# Patient Record
Sex: Female | Born: 1945 | Race: White | Hispanic: No | Smoking: Former smoker
Health system: Southern US, Community
[De-identification: ages and names within clinical notes are randomized; demographics above are authoritative.]

## PROBLEM LIST (undated history)

## (undated) DIAGNOSIS — I2699 Other pulmonary embolism without acute cor pulmonale: Secondary | ICD-10-CM

## (undated) DIAGNOSIS — D649 Anemia, unspecified: Secondary | ICD-10-CM

## (undated) DIAGNOSIS — I82409 Acute embolism and thrombosis of unspecified deep veins of unspecified lower extremity: Secondary | ICD-10-CM

## (undated) DIAGNOSIS — E785 Hyperlipidemia, unspecified: Secondary | ICD-10-CM

## (undated) DIAGNOSIS — I10 Essential (primary) hypertension: Secondary | ICD-10-CM

## (undated) DIAGNOSIS — F0390 Unspecified dementia without behavioral disturbance: Secondary | ICD-10-CM

## (undated) DIAGNOSIS — C50919 Malignant neoplasm of unspecified site of unspecified female breast: Secondary | ICD-10-CM

## (undated) DIAGNOSIS — J189 Pneumonia, unspecified organism: Secondary | ICD-10-CM

## (undated) DIAGNOSIS — F419 Anxiety disorder, unspecified: Secondary | ICD-10-CM

## (undated) DIAGNOSIS — M1990 Unspecified osteoarthritis, unspecified site: Secondary | ICD-10-CM

## (undated) DIAGNOSIS — Z8489 Family history of other specified conditions: Secondary | ICD-10-CM

## (undated) DIAGNOSIS — C801 Malignant (primary) neoplasm, unspecified: Secondary | ICD-10-CM

## (undated) DIAGNOSIS — Q282 Arteriovenous malformation of cerebral vessels: Secondary | ICD-10-CM

## (undated) HISTORY — DX: Malignant neoplasm of unspecified site of unspecified female breast: C50.919

## (undated) HISTORY — PX: ABDOMINAL HYSTERECTOMY: SHX81

## (undated) HISTORY — PX: TUBAL LIGATION: SHX77

## (undated) HISTORY — PX: TONSILLECTOMY: SUR1361

## (undated) HISTORY — PX: COLONOSCOPY: SHX174

## (undated) MED FILL — Dexamethasone Sodium Phosphate Inj 100 MG/10ML: INTRAMUSCULAR | Qty: 1 | Status: AC

---

## 2003-06-26 ENCOUNTER — Other Ambulatory Visit: Payer: Self-pay

## 2004-08-16 ENCOUNTER — Emergency Department: Payer: Self-pay | Admitting: Emergency Medicine

## 2004-08-16 ENCOUNTER — Other Ambulatory Visit: Payer: Self-pay

## 2004-08-23 ENCOUNTER — Ambulatory Visit: Payer: Self-pay | Admitting: Emergency Medicine

## 2005-02-27 ENCOUNTER — Ambulatory Visit: Payer: Self-pay | Admitting: Obstetrics and Gynecology

## 2005-03-12 ENCOUNTER — Ambulatory Visit: Payer: Self-pay | Admitting: Obstetrics and Gynecology

## 2006-08-09 ENCOUNTER — Ambulatory Visit: Payer: Self-pay | Admitting: Obstetrics and Gynecology

## 2007-08-12 ENCOUNTER — Ambulatory Visit: Payer: Self-pay | Admitting: Obstetrics and Gynecology

## 2008-08-12 ENCOUNTER — Ambulatory Visit: Payer: Self-pay | Admitting: Obstetrics and Gynecology

## 2009-10-04 ENCOUNTER — Ambulatory Visit: Payer: Self-pay | Admitting: Obstetrics and Gynecology

## 2011-01-24 ENCOUNTER — Ambulatory Visit: Payer: Self-pay | Admitting: Obstetrics and Gynecology

## 2011-03-15 ENCOUNTER — Ambulatory Visit: Payer: Self-pay | Admitting: Unknown Physician Specialty

## 2011-03-19 LAB — PATHOLOGY REPORT

## 2012-06-06 ENCOUNTER — Ambulatory Visit: Payer: Self-pay | Admitting: Obstetrics and Gynecology

## 2013-05-13 ENCOUNTER — Ambulatory Visit: Payer: Self-pay | Admitting: Internal Medicine

## 2013-06-11 DIAGNOSIS — I671 Cerebral aneurysm, nonruptured: Secondary | ICD-10-CM

## 2013-06-11 HISTORY — DX: Cerebral aneurysm, nonruptured: I67.1

## 2013-06-11 HISTORY — PX: BRAIN SURGERY: SHX531

## 2013-06-22 ENCOUNTER — Ambulatory Visit: Payer: Self-pay | Admitting: Obstetrics and Gynecology

## 2013-06-26 ENCOUNTER — Ambulatory Visit: Payer: Self-pay | Admitting: Obstetrics and Gynecology

## 2013-11-08 DIAGNOSIS — I770 Arteriovenous fistula, acquired: Secondary | ICD-10-CM | POA: Insufficient documentation

## 2013-12-24 ENCOUNTER — Ambulatory Visit: Payer: Self-pay | Admitting: Obstetrics and Gynecology

## 2014-08-02 ENCOUNTER — Ambulatory Visit: Payer: Self-pay | Admitting: Obstetrics and Gynecology

## 2015-03-22 DIAGNOSIS — Z79890 Hormone replacement therapy (postmenopausal): Secondary | ICD-10-CM | POA: Insufficient documentation

## 2015-06-17 DIAGNOSIS — R9082 White matter disease, unspecified: Secondary | ICD-10-CM | POA: Diagnosis not present

## 2015-06-17 DIAGNOSIS — Z79899 Other long term (current) drug therapy: Secondary | ICD-10-CM | POA: Diagnosis not present

## 2015-06-17 DIAGNOSIS — I770 Arteriovenous fistula, acquired: Secondary | ICD-10-CM | POA: Diagnosis not present

## 2015-06-27 DIAGNOSIS — N950 Postmenopausal bleeding: Secondary | ICD-10-CM | POA: Diagnosis not present

## 2015-06-27 DIAGNOSIS — Z79890 Hormone replacement therapy (postmenopausal): Secondary | ICD-10-CM | POA: Diagnosis not present

## 2015-07-13 DIAGNOSIS — Z79890 Hormone replacement therapy (postmenopausal): Secondary | ICD-10-CM | POA: Diagnosis not present

## 2015-07-13 DIAGNOSIS — N950 Postmenopausal bleeding: Secondary | ICD-10-CM | POA: Diagnosis not present

## 2015-07-22 ENCOUNTER — Encounter: Payer: Self-pay | Admitting: *Deleted

## 2015-07-22 ENCOUNTER — Other Ambulatory Visit: Payer: Self-pay

## 2015-07-22 NOTE — Patient Instructions (Signed)
  Your procedure is scheduled on: 07-29-15(FRIDAY) Report to [place] To find out your arrival time please call [phone] between 1PM - 3PM on [id number] [facility])  Remember: Instructions that are not followed completely may result in serious medical risk, up to and including death, or upon the discretion of your surgeon and anesthesiologist your surgery may need to be rescheduled.    _X___ 1. Do not eat food or drink liquids after midnight. No gum chewing or hard candies.     _X___ 2. No Alcohol for 24 hours before or after surgery.   ____ 3. Bring all medications with you on the day of surgery if instructed.    _X___ 4. Notify your doctor if there is any change in your medical condition     (cold, fever, infections).     Do not wear jewelry, make-up, hairpins, clips or nail polish.  Do not wear lotions, powders, or perfumes. You may wear deodorant.  Do not shave 48 hours prior to surgery. Men may shave face and neck.  Do not bring valuables to the hospital.    [facility] is not responsible for any belongings or valuables.               Contacts, dentures or bridgework may not be worn into surgery.  Leave your suitcase in the car. After surgery it may be brought to your room.  For patients admitted to the hospital, discharge time is determined by your  treatment team.   Patients discharged the day of surgery will not be allowed to drive home.   Please read over the following fact sheets that you were given:      _X__ Take these medicines the morning of surgery with A SIP OF WATER:    1. ZOLOFT  2.   3.   4.  5.  6.  ____ Fleet Enema (as directed)   ____ Use CHG Soap as directed  ____ Use inhalers on the day of surgery  ____ Stop metformin 2 days prior to surgery    ____ Take 1/2 of usual insulin dose the night before surgery and none on the morning of surgery.   ____ Stop Coumadin/Plavix/aspirin-N/A  ____ Stop  Anti-inflammatories-NO NSAIDS OR ASPIRIN PRODUCTS-TYLENOL OK TO TAKE   ____ Stop supplements until after surgery.    ____ Bring C-Pap to the hospital.

## 2015-07-24 DIAGNOSIS — D259 Leiomyoma of uterus, unspecified: Secondary | ICD-10-CM | POA: Diagnosis not present

## 2015-07-24 DIAGNOSIS — N950 Postmenopausal bleeding: Secondary | ICD-10-CM | POA: Diagnosis not present

## 2015-07-25 ENCOUNTER — Encounter
Admission: RE | Admit: 2015-07-25 | Discharge: 2015-07-25 | Disposition: A | Discharge status: Home / Self Care | Payer: PPO | Source: Ambulatory Visit | Attending: Obstetrics and Gynecology | Admitting: Obstetrics and Gynecology

## 2015-07-25 DIAGNOSIS — Z01812 Encounter for preprocedural laboratory examination: Secondary | ICD-10-CM | POA: Diagnosis not present

## 2015-07-25 DIAGNOSIS — I10 Essential (primary) hypertension: Secondary | ICD-10-CM | POA: Diagnosis not present

## 2015-07-25 LAB — POTASSIUM: Potassium: 3.5 mmol/L (ref 3.5–5.1)

## 2015-07-25 NOTE — Pre-Procedure Instructions (Signed)
CALLED DR Kayleen Memos ABOUT ABNORMAL EKG-SEND TO PCP FOR REVIEW PER DR Kayleen Memos

## 2015-07-27 NOTE — Pre-Procedure Instructions (Signed)
CALLED DR MARK MILLERS OFFICE REGARDING EKG TO MAKE SURE THEY RECEIVED THE FAX OF THE ABNORMAL EKG THAT WAS SENT OVER LATE Monday EVENING AND THEN REFAXED AGAIN BY MY CO-WORKER ON 07-27-15 AGAIN.  SHE TOOK THE INFO AND SENT IT BACK TO THE NURSE AND SHE SAID THE NURSE WILL CALL ME AND LET ME KNOW IF THEY RECEIVED IT

## 2015-07-27 NOTE — H&P (Signed)
Patricia Miller is a 70 y.o. female here for Pre Op Consulting and to review results from prior biopsy on 06/27/15.  HPI:  Pt presents for a preoperative visit to schedule a D&C, hysteroscopy.  She has a hx of: PMB: Several EMBx (2/16, 01/2006) wtih 5 small fibroids (2cm), on prempro- Last embx  ENDOMERIUM, BIOPSY:  SCANT MINUTE FRAGMENTS OF ATYPICAL GLANDS OF UNCERTAIN SIGNIFICANE, MIXED  PREDOMINENTLY OF BLOOD.  COMMENT: THE BIOPSY MAY NOT BE REPRESENTATIVE. 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  Past Surgical History:  has a past surgical history that includes btl and Tubal ligation. Family History: family history includes Cancer in her sister; Colon cancer in her maternal aunt; Diverticulosis in her mother; No Known Problems in her father. Social History:  reports that she quit smoking about 22 years ago. She has never used smokeless tobacco. She reports that she does not drink alcohol or use illicit drugs. OB/GYN History:  OB History    Gravida Para Term Preterm AB TAB SAB Ectopic Multiple Living   1 1 1       1       Allergies: has No Known Allergies. Medications:  Current Outpatient Prescriptions:  . calcium carbonate-vitamin D3 (OS-CAL 500+D) 500 mg(1,250mg ) -200 unit tablet, Take 1 tablet by mouth 2 (two) times daily with meals., Disp: , Rfl:  . cetirizine (ZYRTEC) 10 mg capsule, Take 10 mg by mouth once daily., Disp: , Rfl:  . cholecalciferol (CHOLECALCIFEROL) 1,000 unit tablet, Take by mouth once daily., Disp: , Rfl:  . cyanocobalamin (VITAMIN B12) 1000 MCG tablet, Take 1,000 mcg by mouth once daily., Disp: , Rfl:  . diazepam (VALIUM) 10 MG tablet, Take  1 tablet (10 mg total) by mouth as directed for Anxiety. Bring to office the day of the procedure. (Patient not taking: Reported on 07/13/2015 ), Disp: 1 tablet, Rfl: 0 . estrogen, conjugated,-medroxyprogesterone (PREMPRO) 0.625-5 mg tablet, Take 1 tablet by mouth once daily., Disp: 90 tablet, Rfl: 1 . hydrochlorothiazide (HYDRODIURIL) 25 MG tablet, Take 1 tablet (25 mg total) by mouth once daily., Disp: 90 tablet, Rfl: 3 . tretinoin (RETIN-A) 0.05 % cream, Apply topically nightly., Disp: 45 g, Rfl: 0  Review of Systems: See HPI   Exam:      Vitals:   07/13/15 1401  BP: 130/88  Pulse: 76    WDWN white female in NAD Body mass index is 26.74 kg/(m^2). Lungs: CTA  CV : RRR without murmur  Breast: exam done in sitting and lying position : No dimpling or retraction, no dominant mass, no spontaneous discharge, no axillary adenopathy Neck: no thyromegaly Abdomen: soft , no mass, normal active bowel sounds, non-tender, no rebound tenderness  Pelvic: Deferred  Impression:   The primary encounter diagnosis was PMB (postmenopausal bleeding). A diagnosis of Hormone replacement therapy (postmenopausal) was also pertinent to this visit.    Plan:   - Preoperative visit: D&C hysteroscopy for atypical cells of undetermined significance on endometrial biopsy for postmenopausal bleeding on combined hormone replacement therapy. Consents signed today. Risks of surgery were discussed with the patient including but not limited to: bleeding which may require transfusion; infection which may require antibiotics; injury to uterus or surrounding organs; intrauterine scarring which may impair future fertility; need for additional procedures including  laparotomy or laparoscopy; and other postoperative/anesthesia complications. Written informed consent was obtained.  This is a scheduled same-day surgery. She will have a postop visit in 2 weeks to review operative findings and pathology.  -   Return in about 4 weeks (around 08/10/2015) for Postop check.  Sherrie George, MD

## 2015-07-28 NOTE — Pre-Procedure Instructions (Signed)
MEL CALLED DR BEASLEYS OFFICE FOR ORDERS

## 2015-07-28 NOTE — Pre-Procedure Instructions (Signed)
RECEIVED FAX FROM DR [facility] OFFICE THAT STATES PT IS LOW RISK FOR SURGERY

## 2015-07-29 ENCOUNTER — Ambulatory Visit: Payer: PPO | Admitting: Anesthesiology

## 2015-07-29 ENCOUNTER — Encounter: Payer: Self-pay | Admitting: *Deleted

## 2015-07-29 ENCOUNTER — Ambulatory Visit
Admission: RE | Admit: 2015-07-29 | Discharge: 2015-07-29 | Disposition: A | Discharge status: Home / Self Care | Payer: PPO | Source: Ambulatory Visit | Attending: Obstetrics and Gynecology | Admitting: Obstetrics and Gynecology

## 2015-07-29 ENCOUNTER — Encounter
Admission: RE | Disposition: A | Discharge status: Home / Self Care | Payer: Self-pay | Source: Ambulatory Visit | Attending: Obstetrics and Gynecology

## 2015-07-29 DIAGNOSIS — Z809 Family history of malignant neoplasm, unspecified: Secondary | ICD-10-CM | POA: Diagnosis not present

## 2015-07-29 DIAGNOSIS — Z8379 Family history of other diseases of the digestive system: Secondary | ICD-10-CM | POA: Diagnosis not present

## 2015-07-29 DIAGNOSIS — Z87891 Personal history of nicotine dependence: Secondary | ICD-10-CM | POA: Diagnosis not present

## 2015-07-29 DIAGNOSIS — N938 Other specified abnormal uterine and vaginal bleeding: Secondary | ICD-10-CM | POA: Diagnosis not present

## 2015-07-29 DIAGNOSIS — R87619 Unspecified abnormal cytological findings in specimens from cervix uteri: Secondary | ICD-10-CM | POA: Diagnosis not present

## 2015-07-29 DIAGNOSIS — D259 Leiomyoma of uterus, unspecified: Secondary | ICD-10-CM | POA: Diagnosis not present

## 2015-07-29 DIAGNOSIS — Z79899 Other long term (current) drug therapy: Secondary | ICD-10-CM | POA: Insufficient documentation

## 2015-07-29 DIAGNOSIS — Z85828 Personal history of other malignant neoplasm of skin: Secondary | ICD-10-CM | POA: Diagnosis not present

## 2015-07-29 DIAGNOSIS — N950 Postmenopausal bleeding: Secondary | ICD-10-CM | POA: Diagnosis not present

## 2015-07-29 DIAGNOSIS — Z800 Family history of malignant neoplasm of digestive organs: Secondary | ICD-10-CM | POA: Insufficient documentation

## 2015-07-29 DIAGNOSIS — I10 Essential (primary) hypertension: Secondary | ICD-10-CM | POA: Diagnosis not present

## 2015-07-29 HISTORY — DX: Essential (primary) hypertension: I10

## 2015-07-29 HISTORY — DX: Malignant (primary) neoplasm, unspecified: C80.1

## 2015-07-29 HISTORY — DX: Unspecified osteoarthritis, unspecified site: M19.90

## 2015-07-29 HISTORY — PX: HYSTEROSCOPY WITH D & C: SHX1775

## 2015-07-29 LAB — TYPE AND SCREEN
ABO/RH(D): O POS
ANTIBODY SCREEN: NEGATIVE

## 2015-07-29 LAB — CBC
HEMATOCRIT: 42.7 % (ref 35.0–47.0)
HEMOGLOBIN: 14.6 g/dL (ref 12.0–16.0)
MCH: 30.6 pg (ref 26.0–34.0)
MCHC: 34.2 g/dL (ref 32.0–36.0)
MCV: 89.6 fL (ref 80.0–100.0)
Platelets: 208 10*3/uL (ref 150–440)
RBC: 4.77 MIL/uL (ref 3.80–5.20)
RDW: 12.5 % (ref 11.5–14.5)
WBC: 5.3 10*3/uL (ref 3.6–11.0)

## 2015-07-29 LAB — BASIC METABOLIC PANEL
ANION GAP: 10 (ref 5–15)
BUN: 13 mg/dL (ref 6–20)
CHLORIDE: 106 mmol/L (ref 101–111)
CO2: 22 mmol/L (ref 22–32)
CREATININE: 0.67 mg/dL (ref 0.44–1.00)
Calcium: 8.7 mg/dL — ABNORMAL LOW (ref 8.9–10.3)
GFR calc non Af Amer: >60 mL/min (ref >60)
Glucose, Bld: 89 mg/dL (ref 65–99)
POTASSIUM: 3.3 mmol/L — AB (ref 3.5–5.1)
Sodium: 138 mmol/L (ref 135–145)

## 2015-07-29 LAB — ABO/RH: ABO/RH(D): O POS

## 2015-07-29 SURGERY — DILATATION AND CURETTAGE /HYSTEROSCOPY
Anesthesia: General | Site: Uterus | Wound class: Clean Contaminated

## 2015-07-29 MED ORDER — KETOROLAC TROMETHAMINE 30 MG/ML IJ SOLN
INTRAMUSCULAR | Status: AC
  Administered 2015-07-29: 15 mg
  Filled 2015-07-29: qty 1

## 2015-07-29 MED ORDER — PROPOFOL 10 MG/ML IV BOLUS
INTRAVENOUS | Status: DC | PRN
  Administered 2015-07-29: 120 mg via INTRAVENOUS
  Administered 2015-07-29: 60 mg via INTRAVENOUS

## 2015-07-29 MED ORDER — LIDOCAINE HCL (CARDIAC) 20 MG/ML IV SOLN
INTRAVENOUS | Status: DC | PRN
  Administered 2015-07-29: 80 mg via INTRAVENOUS

## 2015-07-29 MED ORDER — LACTATED RINGERS IV SOLN: INTRAVENOUS | Status: DC

## 2015-07-29 MED ORDER — ONDANSETRON HCL 4 MG/2ML IJ SOLN
INTRAMUSCULAR | Status: DC | PRN
Start: 2015-07-29 — End: 2015-07-29
  Administered 2015-07-29: 4 mg via INTRAVENOUS

## 2015-07-29 MED ORDER — IBUPROFEN 600 MG PO TABS: 600.0000 mg | ORAL_TABLET | Freq: Four times a day (QID) | ORAL | Status: DC | PRN

## 2015-07-29 MED ORDER — SILVER NITRATE-POT NITRATE 75-25 % EX MISC
CUTANEOUS | Status: AC
  Filled 2015-07-29: qty 4

## 2015-07-29 MED ORDER — ACETIC ACID 4% SOLUTION
Status: DC | PRN
  Administered 2015-07-29: 1 via TOPICAL

## 2015-07-29 MED ORDER — ONDANSETRON HCL 4 MG/2ML IJ SOLN: 4.0000 mg | Freq: Once | INTRAMUSCULAR | Status: DC | PRN

## 2015-07-29 MED ORDER — FENTANYL CITRATE (PF) 100 MCG/2ML IJ SOLN
INTRAMUSCULAR | Status: AC
  Administered 2015-07-29: 25 ug via INTRAVENOUS
  Filled 2015-07-29: qty 2

## 2015-07-29 MED ORDER — FAMOTIDINE 20 MG PO TABS
20.0000 mg | ORAL_TABLET | Freq: Once | ORAL | Status: AC
  Administered 2015-07-29: 20 mg via ORAL

## 2015-07-29 MED ORDER — FAMOTIDINE 20 MG PO TABS
ORAL_TABLET | ORAL | Status: AC
  Administered 2015-07-29: 20 mg via ORAL
  Filled 2015-07-29: qty 1

## 2015-07-29 MED ORDER — DEXAMETHASONE SODIUM PHOSPHATE 10 MG/ML IJ SOLN
INTRAMUSCULAR | Status: DC | PRN
  Administered 2015-07-29: 10 mg via INTRAVENOUS

## 2015-07-29 MED ORDER — PHENYLEPHRINE HCL 10 MG/ML IJ SOLN
INTRAMUSCULAR | Status: DC | PRN
  Administered 2015-07-29 (×2): 50 ug via INTRAVENOUS

## 2015-07-29 MED ORDER — DOCUSATE SODIUM 100 MG PO CAPS: 100.0000 mg | ORAL_CAPSULE | Freq: Two times a day (BID) | ORAL | Status: DC | PRN

## 2015-07-29 MED ORDER — FENTANYL CITRATE (PF) 100 MCG/2ML IJ SOLN
INTRAMUSCULAR | Status: DC | PRN
  Administered 2015-07-29 (×4): 25 ug via INTRAVENOUS

## 2015-07-29 MED ORDER — FENTANYL CITRATE (PF) 100 MCG/2ML IJ SOLN
25.0000 ug | INTRAMUSCULAR | Status: DC | PRN
  Administered 2015-07-29 (×4): 25 ug via INTRAVENOUS

## 2015-07-29 MED ORDER — LACTATED RINGERS IV SOLN
INTRAVENOUS | Status: DC
  Administered 2015-07-29 (×2): via INTRAVENOUS

## 2015-07-29 SURGICAL SUPPLY — 26 items
ABLATOR ENDOMETRIAL MYOSURE (ABLATOR) ×3 IMPLANT
CANISTER SUCT 3000ML (MISCELLANEOUS) ×3 IMPLANT
CATH ROBINSON RED A/P 16FR (CATHETERS) ×3 IMPLANT
CORD URO TURP 10FT (MISCELLANEOUS) IMPLANT
DRSG TELFA 3X8 NADH (GAUZE/BANDAGES/DRESSINGS) ×6 IMPLANT
ELECT LOOP MED HF 24F 12D (CUTTING LOOP) IMPLANT
ELECT REM PT RETURN 9FT ADLT (ELECTROSURGICAL) ×3
ELECT RESECT POWERBALL 24F (MISCELLANEOUS) IMPLANT
ELECTRODE REM PT RTRN 9FT ADLT (ELECTROSURGICAL) ×1 IMPLANT
GLOVE BIO SURGEON STRL SZ 6.5 (GLOVE) ×2 IMPLANT
GLOVE BIO SURGEONS STRL SZ 6.5 (GLOVE) ×1
GLOVE INDICATOR 7.0 STRL GRN (GLOVE) ×3 IMPLANT
GOWN STRL REUS W/ TWL LRG LVL3 (GOWN DISPOSABLE) ×2 IMPLANT
GOWN STRL REUS W/TWL LRG LVL3 (GOWN DISPOSABLE) ×4
IV LACTATED RINGER IRRG 3000ML (IV SOLUTION) ×2
IV LACTATED RINGERS 1000ML (IV SOLUTION) ×3 IMPLANT
IV LR IRRIG 3000ML ARTHROMATIC (IV SOLUTION) ×1 IMPLANT
KIT RM TURNOVER CYSTO AR (KITS) ×3 IMPLANT
PACK DNC HYST (MISCELLANEOUS) ×3 IMPLANT
PAD OB MATERNITY 4.3X12.25 (PERSONAL CARE ITEMS) ×3 IMPLANT
PAD PREP 24X41 OB/GYN DISP (PERSONAL CARE ITEMS) ×3 IMPLANT
SEAL ROD LENS SCOPE MYOSURE (ABLATOR) ×3 IMPLANT
SPONGE XRAY 4X4 16PLY STRL (MISCELLANEOUS) ×3 IMPLANT
TUBING CONNECTING 10 (TUBING) ×2 IMPLANT
TUBING CONNECTING 10' (TUBING) ×1
TUBING HYSTEROSCOPY DOLPHIN (MISCELLANEOUS) ×3 IMPLANT

## 2015-07-29 NOTE — Anesthesia Preprocedure Evaluation (Signed)
Anesthesia Evaluation  Patient identified by MRN, date of birth, ID band Patient awake    Reviewed: Allergy & Precautions, NPO status , Patient's Chart, lab work & pertinent test results  Airway Mallampati: II  TM Distance: >3 FB Neck ROM: Full    Dental  (+) Chipped   Pulmonary former smoker,    Pulmonary exam normal breath sounds clear to auscultation       Cardiovascular hypertension, Pt. on medications Normal cardiovascular exam     Neuro/Psych negative neurological ROS  negative psych ROS   GI/Hepatic negative GI ROS, Neg liver ROS,   Endo/Other  negative endocrine ROS  Renal/GU negative Renal ROS  negative genitourinary   Musculoskeletal  (+) Arthritis , Osteoarthritis,    Abdominal Normal abdominal exam  (+)   Peds negative pediatric ROS (+)  Hematology negative hematology ROS (+)   Anesthesia Other Findings   Reproductive/Obstetrics negative OB ROS                             Anesthesia Physical Anesthesia Plan  ASA: II  Anesthesia Plan: General   Post-op Pain Management:    Induction: Intravenous  Airway Management Planned: LMA  Additional Equipment:   Intra-op Plan:   Post-operative Plan: Extubation in OR  Informed Consent: I have reviewed the patients History and Physical, chart, labs and discussed the procedure including the risks, benefits and alternatives for the proposed anesthesia with the patient or authorized representative who has indicated his/her understanding and acceptance.   Dental advisory given  Plan Discussed with: CRNA and Surgeon  Anesthesia Plan Comments:         Anesthesia Quick Evaluation

## 2015-07-29 NOTE — Op Note (Signed)
Operative Report Hysteroscopy with Dilation and Curettage   Indications: Postmenopausal bleeding   Pre-operative Diagnosis: Abnormal endometrial cells of undetermined significance   Post-operative Diagnosis: Uterine fibroids, multiple.  Procedure: 1. Exam under anesthesia 2. Fractional D&C 3. Hysteroscopy 4. Cervical biopsy after application of 3% acetic acid 5. Hysteroscopic myomectomy using Myosure device  Surgeon: Benjaman Kindler, MD  Assistant(s):  None  Anesthesia: General endotracheal anesthesia  Estimated Blood Loss:  68mL         Total IV Fluids: 874ml  Urine Output: 119ml  Total Fluid Deficit: 1118mL          Specimens: Endocervical curettings, endometrial curettings         Complications:  None; patient tolerated the procedure well.         Disposition: PACU - hemodynamically stable.         Condition: stable  Findings: Uterus measuring 9.5 cm by sound; normal cervix, vagina, perineum. The cervix was able to be brought carefully to the introitus. Acetowhite epithelium noted at 12 o'clock on the ectocervix. Proliferative endometrium noted with multiple large fibroids distorting the endometrial canal. Neither tubal ostia were visible behind the fibroids, nor would a septum have been seen.   Indication for procedure/Consents: 70 y.o. with postmenopausal bleeding with abnormal endometrial cells of undetermined significance on biopsy.  Risks of surgery were discussed with the patient including but not limited to: bleeding which may require transfusion; infection which may require antibiotics; injury to uterus or surrounding organs; intrauterine scarring which may impair future fertility; need for additional procedures including laparotomy or laparoscopy; and other postoperative/anesthesia complications. Written informed consent was obtained.    Procedure Details:   The patient was taken to the operating room where anesthesia was administered and was found to be  adequate. After a formal and adequate timeout was performed, she was placed in the dorsal lithotomy position and examined with the above findings. She was then prepped and draped in the sterile manner. Her bladder was catheterized for an estimated amount of clear, yellow urine. A weighed speculum was then placed in the patient's vagina and a single tooth tenaculum was applied to the anterior lip of the cervix.  Her cervix was serially dilated to 15 [place] using Hanks dilators. An ECC was performed. The hysteroscope was introduced under direct observation  Using lactated ringers as a distention medium to reveal the above findings. The uterine cavity was carefully examined, both ostia were recognized, and diffusely proliferative endometrium with fibroids above were noted.   Several of these were resected using the Myosure device, but even after careful observation the cavity continued to be distorted from many more large fibroid masses. The fluid deficit continued to climb and the decision was made to sample each for pathologic review and to terminate the procedure.  After further careful visualization of the uterine cavity, the hysteroscope was removed under direct visualization.  A sharp curettage was then performed until there was a gritty texture in all four quadrants. The tenaculum was removed from the anterior lip of the cervix and the vaginal speculum was removed after applying silver nitrate for good hemostasis.   The patient tolerated the procedure well and was taken to the recovery area awake and in stable condition. She received iv acetaminophen and Toradol prior to leaving the OR.  The patient will be discharged to home as per PACU criteria. Routine postoperative instructions given. She was prescribed Ibuprofen and Colace. She will follow up in the clinic in two weeks for  postoperative evaluation.

## 2015-07-29 NOTE — Discharge Instructions (Signed)
Discharge instructions after a hysteroscopy with dilation and curettage  Signs and Symptoms to Report  Call our office at [phone] if you have any of the following:   . Fever over 100.4 degrees or higher . Severe stomach pain not relieved with pain medications . Bright red bleeding that's heavier than a period that does not slow with rest after the first 24 hours . To go the bathroom a lot (frequency), you can't hold your urine (urgency), or it hurts when you empty your bladder (urinate) . Chest pain . Shortness of breath . Pain in the calves of your legs . Severe nausea and vomiting not relieved with anti-nausea medications . Any concerns  What You Can Expect after Surgery . You may see some pink tinged, bloody fluid. This is normal. You may also have cramping for several days.   Activities after Your Discharge Follow these guidelines to help speed your recovery at home: . Don't drive if you are in pain or taking narcotic pain medicine. You may drive when you can safely slam on the brakes, turn the wheel forcefully, and rotate your torso comfortably. This is typically 4-7 days. Practice in a parking lot or side street prior to attempting to drive regularly.  . Ask others to help with household chores for 4 weeks. . Don't do strenuous activities, exercises, or sports like vacuuming, tennis, squash, etc. until your doctor says it is safe to do so. . Walk as you feel able. Rest often since it may take a week or two for your energy level to return to normal.  . You may climb stairs . Avoid constipation:   -Eat fruits, vegetables, and whole grains. Eat small meals as your appetite will take time to return to normal.   -Drink 6 to 8 glasses of water each day unless your doctor has told you to limit your fluids.   -Use a laxative or stool softener as needed if constipation becomes a problem. You may take Miralax, metamucil, Citrucil, Colace, Senekot, FiberCon, etc. If this does not  relieve the constipation, try two tablespoons of Milk Of Magnesia every 8 hours until your bowels move.  . You may shower.  . Do not get in a hot tub, swimming pool, etc. until your doctor agrees. . Do not douche, use tampons, or have sex until your doctor says it is okay, usually about 2 weeks. . Take your pain medicine when you need it. The medicine may not work as well if the pain is bad.  Take the medicines you were taking before surgery. Other medications you might need are pain medications (ibuprofen), medications for constipation (Colace) and nausea medications (Zofran).        AMBULATORY SURGERY  DISCHARGE INSTRUCTIONS   1) The drugs that you were given will stay in your system until tomorrow so for the next 24 hours you should not:  A) Drive an automobile B) Make any legal decisions C) Drink any alcoholic beverage   2) You may resume regular meals tomorrow.  Today it is better to start with liquids and gradually work up to solid foods.  You may eat anything you prefer, but it is better to start with liquids, then soup and crackers, and gradually work up to solid foods.   3) Please notify your doctor immediately if you have any unusual bleeding, trouble breathing, redness and pain at the surgery site, drainage, fever, or pain not relieved by medication.    4) Additional Instructions:          Please contact your physician with any problems or Same Day Surgery at [phone], Monday through Friday 6 am to 4 pm, or [place] [facility] [place] Main number at [phone]. 

## 2015-07-29 NOTE — Interval H&P Note (Signed)
History and Physical Interval Note:  07/29/2015 9:49 AM  Patricia Miller  has presented today for surgery, with the diagnosis of Post Menopausal Bleeding Abnormal cells on biopsy  The various methods of treatment have been discussed with the patient and family. After consideration of risks, benefits and other options for treatment, the patient has consented to  Procedure(s): DILATATION AND CURETTAGE /HYSTEROSCOPY (N/A) as a surgical intervention .  The patient's history has been reviewed, patient examined, no change in status, stable for surgery.  I have reviewed the patient's chart and labs.  Questions were answered to the patient's satisfaction.     Benjaman Kindler

## 2015-07-29 NOTE — Transfer of Care (Signed)
Immediate Anesthesia Transfer of Care Note  Patient: Patricia Miller  Procedure(s) Performed: Procedure(s): DILATATION AND CURETTAGE /HYSTEROSCOPY (N/A)  Patient Location: PACU  Anesthesia Type:General  Level of Consciousness: awake, alert  and oriented  Airway & Oxygen Therapy: Patient Spontanous Breathing and Patient connected to face mask oxygen  Post-op Assessment: Report given to RN and Post -op Vital signs reviewed and stable  Post vital signs: Reviewed  Last Vitals:  Filed Vitals:   07/29/15 0837  BP: 149/79  Pulse: 85  Temp: 36.9 C  Resp: 16    Complications: No apparent anesthesia complications

## 2015-08-01 LAB — SURGICAL PATHOLOGY

## 2015-08-03 NOTE — Anesthesia Postprocedure Evaluation (Signed)
Anesthesia Post Note  Patient: Patricia Miller  Procedure(s) Performed: Procedure(s) (LRB): DILATATION AND CURETTAGE /HYSTEROSCOPY (N/A)  Patient location during evaluation: PACU Anesthesia Type: General Level of consciousness: awake and alert and oriented Pain management: pain level controlled Vital Signs Assessment: post-procedure vital signs reviewed and stable Respiratory status: spontaneous breathing Cardiovascular status: blood pressure returned to baseline Anesthetic complications: no    Last Vitals:  Filed Vitals:   07/29/15 1214 07/29/15 1239  BP: 150/80 128/64  Pulse: 82 85  Temp: 36.5 C 36.5 C  Resp: 16 16    Last Pain:  Filed Vitals:   08/01/15 1229  PainSc: 0-No pain                 Curstin Schmale

## 2015-10-11 DIAGNOSIS — Z1231 Encounter for screening mammogram for malignant neoplasm of breast: Secondary | ICD-10-CM | POA: Diagnosis not present

## 2015-10-11 DIAGNOSIS — Z01419 Encounter for gynecological examination (general) (routine) without abnormal findings: Secondary | ICD-10-CM | POA: Diagnosis not present

## 2015-11-29 DIAGNOSIS — Z0000 Encounter for general adult medical examination without abnormal findings: Secondary | ICD-10-CM | POA: Diagnosis not present

## 2016-08-15 DIAGNOSIS — J40 Bronchitis, not specified as acute or chronic: Secondary | ICD-10-CM | POA: Diagnosis not present

## 2016-08-15 DIAGNOSIS — J4522 Mild intermittent asthma with status asthmaticus: Secondary | ICD-10-CM | POA: Diagnosis not present

## 2016-08-20 ENCOUNTER — Other Ambulatory Visit: Payer: Self-pay | Admitting: Obstetrics and Gynecology

## 2016-08-20 DIAGNOSIS — Z1231 Encounter for screening mammogram for malignant neoplasm of breast: Secondary | ICD-10-CM

## 2016-09-19 ENCOUNTER — Ambulatory Visit
Admission: RE | Admit: 2016-09-19 | Discharge: 2016-09-19 | Disposition: A | Discharge status: Home / Self Care | Payer: PPO | Source: Ambulatory Visit | Attending: Obstetrics and Gynecology | Admitting: Obstetrics and Gynecology

## 2016-09-19 DIAGNOSIS — Z1231 Encounter for screening mammogram for malignant neoplasm of breast: Secondary | ICD-10-CM | POA: Insufficient documentation

## 2016-11-23 DIAGNOSIS — Z86010 Personal history of colonic polyps: Secondary | ICD-10-CM | POA: Diagnosis not present

## 2016-11-29 DIAGNOSIS — Z79899 Other long term (current) drug therapy: Secondary | ICD-10-CM | POA: Diagnosis not present

## 2016-11-29 DIAGNOSIS — Z0000 Encounter for general adult medical examination without abnormal findings: Secondary | ICD-10-CM | POA: Diagnosis not present

## 2016-11-29 DIAGNOSIS — D369 Benign neoplasm, unspecified site: Secondary | ICD-10-CM | POA: Diagnosis present

## 2016-11-29 DIAGNOSIS — I770 Arteriovenous fistula, acquired: Secondary | ICD-10-CM | POA: Diagnosis not present

## 2017-02-19 ENCOUNTER — Encounter: Payer: Self-pay | Admitting: *Deleted

## 2017-02-20 ENCOUNTER — Ambulatory Visit: Payer: PPO | Admitting: Anesthesiology

## 2017-02-20 ENCOUNTER — Ambulatory Visit
Admission: RE | Admit: 2017-02-20 | Discharge: 2017-02-20 | Disposition: A | Discharge status: Home / Self Care | Payer: PPO | Source: Ambulatory Visit | Attending: Unknown Physician Specialty | Admitting: Unknown Physician Specialty

## 2017-02-20 ENCOUNTER — Encounter
Admission: RE | Disposition: A | Discharge status: Home / Self Care | Payer: Self-pay | Source: Ambulatory Visit | Attending: Unknown Physician Specialty

## 2017-02-20 ENCOUNTER — Encounter: Payer: Self-pay | Admitting: *Deleted

## 2017-02-20 DIAGNOSIS — Z79899 Other long term (current) drug therapy: Secondary | ICD-10-CM | POA: Diagnosis not present

## 2017-02-20 DIAGNOSIS — M1990 Unspecified osteoarthritis, unspecified site: Secondary | ICD-10-CM | POA: Diagnosis not present

## 2017-02-20 DIAGNOSIS — K635 Polyp of colon: Secondary | ICD-10-CM | POA: Insufficient documentation

## 2017-02-20 DIAGNOSIS — Z86010 Personal history of colonic polyps: Secondary | ICD-10-CM | POA: Diagnosis not present

## 2017-02-20 DIAGNOSIS — I10 Essential (primary) hypertension: Secondary | ICD-10-CM | POA: Diagnosis not present

## 2017-02-20 DIAGNOSIS — K5730 Diverticulosis of large intestine without perforation or abscess without bleeding: Secondary | ICD-10-CM | POA: Insufficient documentation

## 2017-02-20 DIAGNOSIS — Z85828 Personal history of other malignant neoplasm of skin: Secondary | ICD-10-CM | POA: Insufficient documentation

## 2017-02-20 DIAGNOSIS — Z1211 Encounter for screening for malignant neoplasm of colon: Secondary | ICD-10-CM | POA: Diagnosis not present

## 2017-02-20 DIAGNOSIS — M47812 Spondylosis without myelopathy or radiculopathy, cervical region: Secondary | ICD-10-CM | POA: Diagnosis not present

## 2017-02-20 DIAGNOSIS — Z87891 Personal history of nicotine dependence: Secondary | ICD-10-CM | POA: Insufficient documentation

## 2017-02-20 DIAGNOSIS — Z85820 Personal history of malignant melanoma of skin: Secondary | ICD-10-CM | POA: Insufficient documentation

## 2017-02-20 DIAGNOSIS — K640 First degree hemorrhoids: Secondary | ICD-10-CM | POA: Diagnosis not present

## 2017-02-20 DIAGNOSIS — K5790 Diverticulosis of intestine, part unspecified, without perforation or abscess without bleeding: Secondary | ICD-10-CM | POA: Diagnosis not present

## 2017-02-20 DIAGNOSIS — D125 Benign neoplasm of sigmoid colon: Secondary | ICD-10-CM | POA: Diagnosis not present

## 2017-02-20 DIAGNOSIS — K621 Rectal polyp: Secondary | ICD-10-CM | POA: Insufficient documentation

## 2017-02-20 DIAGNOSIS — K648 Other hemorrhoids: Secondary | ICD-10-CM | POA: Diagnosis not present

## 2017-02-20 HISTORY — PX: COLONOSCOPY WITH PROPOFOL: SHX5780

## 2017-02-20 SURGERY — COLONOSCOPY WITH PROPOFOL
Anesthesia: General

## 2017-02-20 MED ORDER — SODIUM CHLORIDE 0.9 % IJ SOLN
INTRAMUSCULAR | Status: AC
  Filled 2017-02-20: qty 10

## 2017-02-20 MED ORDER — SODIUM CHLORIDE 0.9 % IV SOLN: INTRAVENOUS | Status: DC

## 2017-02-20 MED ORDER — PROPOFOL 10 MG/ML IV BOLUS
INTRAVENOUS | Status: AC
  Filled 2017-02-20: qty 20

## 2017-02-20 MED ORDER — PROPOFOL 500 MG/50ML IV EMUL
INTRAVENOUS | Status: DC | PRN
  Administered 2017-02-20: 70 ug/kg/min via INTRAVENOUS

## 2017-02-20 MED ORDER — EPHEDRINE SULFATE 50 MG/ML IJ SOLN
INTRAMUSCULAR | Status: AC
  Filled 2017-02-20: qty 1

## 2017-02-20 MED ORDER — PROPOFOL 10 MG/ML IV BOLUS
INTRAVENOUS | Status: DC | PRN
  Administered 2017-02-20: 50 mg via INTRAVENOUS
  Administered 2017-02-20 (×3): 20 mg via INTRAVENOUS

## 2017-02-20 MED ORDER — PROPOFOL 500 MG/50ML IV EMUL
INTRAVENOUS | Status: AC
  Filled 2017-02-20: qty 100

## 2017-02-20 MED ORDER — SODIUM CHLORIDE 0.9 % IV SOLN
INTRAVENOUS | Status: DC
  Administered 2017-02-20 (×2): via INTRAVENOUS

## 2017-02-20 MED ORDER — PHENYLEPHRINE HCL 10 MG/ML IJ SOLN
INTRAMUSCULAR | Status: AC
  Filled 2017-02-20: qty 1

## 2017-02-20 NOTE — Anesthesia Preprocedure Evaluation (Signed)
Anesthesia Evaluation  Patient identified by MRN, date of birth, ID band Patient awake    Reviewed: Allergy & Precautions, H&P , NPO status , Patient's Chart, lab work & pertinent test results  History of Anesthesia Complications Negative for: history of anesthetic complications  Airway Mallampati: III  TM Distance: <3 FB Neck ROM: limited    Dental  (+) Chipped   Pulmonary neg shortness of breath, former smoker,           Cardiovascular Exercise Tolerance: Good hypertension, (-) angina(-) Past MI and (-) DOE      Neuro/Psych negative neurological ROS  negative psych ROS   GI/Hepatic negative GI ROS, Neg liver ROS, neg GERD  ,  Endo/Other  negative endocrine ROS  Renal/GU negative Renal ROS  negative genitourinary   Musculoskeletal  (+) Arthritis ,   Abdominal   Peds  Hematology negative hematology ROS (+)   Anesthesia Other Findings Past Medical History: No date: Arthritis     Comment:  NECK No date: Cancer (HCC)     Comment:  BASAL CELL AND MELANOMA No date: Hypertension  Past Surgical History: No date: COLONOSCOPY 07/29/2015: HYSTEROSCOPY W/D&C; N/A     Comment:  Procedure: DILATATION AND CURETTAGE /HYSTEROSCOPY;                Surgeon: Benjaman Kindler, MD;  Location: [facility] ORS;                Service: Gynecology;  Laterality: N/A; No date: TONSILLECTOMY     Comment:  AGE 71 No date: TUBAL LIGATION     Reproductive/Obstetrics negative OB ROS                             Anesthesia Physical Anesthesia Plan  ASA: III  Anesthesia Plan: General   Post-op Pain Management:    Induction: Intravenous  PONV Risk Score and Plan: Propofol infusion  Airway Management Planned: Natural Airway and Nasal Cannula  Additional Equipment:   Intra-op Plan:   Post-operative Plan:   Informed Consent: I have reviewed the patients History and Physical, chart, labs and discussed the  procedure including the risks, benefits and alternatives for the proposed anesthesia with the patient or authorized representative who has indicated his/her understanding and acceptance.   Dental Advisory Given  Plan Discussed with: Anesthesiologist, CRNA and Surgeon  Anesthesia Plan Comments: (Patient consented for risks of anesthesia including but not limited to:  - adverse reactions to medications - risk of intubation if required - damage to teeth, lips or other oral mucosa - sore throat or hoarseness - Damage to heart, brain, lungs or loss of life  Patient voiced understanding.)        Anesthesia Quick Evaluation

## 2017-02-20 NOTE — H&P (Signed)
Primary Care Physician:  Rusty Aus, MD Primary Gastroenterologist:  Dr. Vira Agar  Pre-Procedure History & Physical: HPI:  Patricia Miller is a 71 y.o. female is here for a colonoscopy.   Past Medical History:  Diagnosis Date  . Arthritis    NECK  . Cancer ([place])    BASAL CELL AND MELANOMA  . Hypertension     Past Surgical History:  Procedure Laterality Date  . COLONOSCOPY    . HYSTEROSCOPY W/D&C N/A 07/29/2015   Procedure: DILATATION AND CURETTAGE /HYSTEROSCOPY;  Surgeon: Benjaman Kindler, MD;  Location: [facility] ORS;  Service: Gynecology;  Laterality: N/A;  . TONSILLECTOMY     AGE 39  . TUBAL LIGATION      Prior to Admission medications   Medication Sig Start Date End Date Taking? Authorizing Provider  docusate sodium (COLACE) 100 MG capsule Take 1 capsule (100 mg total) by mouth 2 (two) times daily as needed. 07/29/15  Yes Benjaman Kindler, MD  LUTEIN PO Take by mouth.   Yes [provider]  PRASTERONE, DHEA, PO Take by mouth.   Yes [provider]  sertraline (ZOLOFT) 50 MG tablet Take 50 mg by mouth every morning.   Yes [provider]  Calcium Carbonate-Vitamin D (OS-CAL 500 + D PO) Take 1 tablet by mouth 2 (two) times daily.    [provider]  cetirizine (ZYRTEC) 10 MG tablet Take 10 mg by mouth daily.    [provider]  cholecalciferol (VITAMIN D) 1000 units tablet Take 1,000 Units by mouth daily.    [provider]  estrogen, conjugated,-medroxyprogesterone (PREMPRO) 0.625-5 MG tablet Take 1 tablet by mouth daily.    [provider]  hydrochlorothiazide (HYDRODIURIL) 25 MG tablet Take 12.5 mg by mouth daily.    [provider]  ibuprofen (ADVIL,MOTRIN) 600 MG tablet Take 1 tablet (600 mg total) by mouth every 6 (six) hours as needed. 07/29/15   Benjaman Kindler, MD  tretinoin (RETIN-A) 0.05 % cream Apply 1 application topically as needed.    [provider]  vitamin B-12 (CYANOCOBALAMIN)  1000 MCG tablet Take 1,000 mcg by mouth daily.    [provider]    Allergies as of 12/06/2016  . (No Known Allergies)    History reviewed. No pertinent family history.  Social History   Social History  . Marital status: Divorced    Spouse name: N/A  . Number of children: N/A  . Years of education: N/A   Occupational History  . Not on file.   Social History Main Topics  . Smoking status: Former Smoker    Packs/day: 0.50    Years: 15.00    Types: Cigarettes    Quit date: 07/21/2000  . Smokeless tobacco: Never Used  . Alcohol use No  . Drug use: No  . Sexual activity: Not on file   Other Topics Concern  . Not on file   Social History Narrative  . No narrative on file    Review of Systems: See HPI, otherwise negative ROS  Physical Exam: BP 132/71   Pulse 75   Temp 99.1 F (37.3 C) (Tympanic)   Resp 20   Ht 4\' 11"  (1.499 m)   Wt 59 kg (130 lb)   SpO2 100%   BMI 26.26 kg/m  General:   Alert,  pleasant and cooperative in NAD Head:  Normocephalic and atraumatic. Neck:  Supple; no masses or thyromegaly. Lungs:  Clear throughout to auscultation.    Heart:  Regular  rate and rhythm. Abdomen:  Soft, nontender and nondistended. Normal bowel sounds, without guarding, and without rebound.   Neurologic:  Alert and  oriented x4;  grossly normal neurologically.  Impression/Plan: Patricia Miller is here for an colonoscopy to be performed for [facility] colon polyps.  Risks, benefits, limitations, and alternatives regarding  colonoscopy have been reviewed with the patient.  Questions have been answered.  All parties agreeable.   Gaylyn Cheers, MD  02/20/2017, 8:24 AM

## 2017-02-20 NOTE — Anesthesia Postprocedure Evaluation (Signed)
Anesthesia Post Note  Patient: Patricia Miller  Procedure(s) Performed: Procedure(s) (LRB): COLONOSCOPY WITH PROPOFOL (N/A)  Patient location during evaluation: Endoscopy Anesthesia Type: General Level of consciousness: awake and alert Pain management: pain level controlled Vital Signs Assessment: post-procedure vital signs reviewed and stable Respiratory status: spontaneous breathing, nonlabored ventilation, respiratory function stable and patient connected to nasal cannula oxygen Cardiovascular status: blood pressure returned to baseline and stable Postop Assessment: no signs of nausea or vomiting Anesthetic complications: no     Last Vitals:  Vitals:   02/20/17 0920 02/20/17 0930  BP: (!) 142/86 (!) 153/76  Pulse: 75 73  Resp: 14 19  Temp:    SpO2: 100% 100%    Last Pain:  Vitals:   02/20/17 0900  TempSrc: Tympanic                 Precious Haws Jeanmarc Viernes

## 2017-02-20 NOTE — Op Note (Signed)
[facility] Gastroenterology Patient Name: Patricia Miller Procedure Date: 02/20/2017 8:26 AM MRN: [id number] Account #: [id number] Date of Birth: [birth year] Admit Type: Outpatient Age: 71 Room: [facility] ENDO ROOM 3 Gender: Female Note Status: Finalized Procedure:            Colonoscopy Indications:          High risk colon cancer surveillance: Personal history                        of colonic polyps Providers:            Manya Silvas, MD Referring MD:         Rusty Aus, MD (Referring MD) Medicines:            Propofol per Anesthesia Complications:        No immediate complications. Procedure:            Pre-Anesthesia Assessment:                       - After reviewing the risks and benefits, the patient                        was deemed in satisfactory condition to undergo the                        procedure.                       After obtaining informed consent, the colonoscope was                        passed under direct vision. Throughout the procedure,                        the patient's blood pressure, pulse, and oxygen                        saturations were monitored continuously. The                        Colonoscope was introduced through the anus and                        advanced to the the cecum, identified by appendiceal                        orifice and ileocecal valve. The colonoscopy was                        performed without difficulty. The patient tolerated the                        procedure well. The quality of the bowel preparation                        was excellent. Findings:      A diminutive polyp was found in the sigmoid colon. The polyp was       sessile. The polyp was removed with a jumbo cold forceps. Resection and       retrieval were complete.      A diminutive polyp was  found in the rectum. The polyp was sessile. The       polyp was removed with a jumbo cold forceps. Resection and retrieval       were  complete.      Many small-mouthed diverticula were found in the sigmoid colon and one       in the proximal ascending colon.      Internal hemorrhoids were found during endoscopy. The hemorrhoids were       small and Grade I (internal hemorrhoids that do not prolapse).      The exam was otherwise without abnormality. Impression:           - One diminutive polyp in the sigmoid colon, removed                        with a jumbo cold forceps. Resected and retrieved.                       - One diminutive polyp in the rectum, removed with a                        jumbo cold forceps. Resected and retrieved.                       - Diverticulosis in the sigmoid colon and in the                        proximal ascending colon.                       - Internal hemorrhoids.                       - The examination was otherwise normal. Recommendation:       - Await pathology results. Manya Silvas, MD 02/20/2017 9:07:19 AM This report has been signed electronically. Number of Addenda: 0 Note Initiated On: 02/20/2017 8:26 AM Scope Withdrawal Time: 0 hours 12 minutes 5 seconds  Total Procedure Duration: 0 hours 20 minutes 29 seconds       [facility]

## 2017-02-20 NOTE — Transfer of Care (Signed)
Immediate Anesthesia Transfer of Care Note  Patient: Patricia Miller  Procedure(s) Performed: Procedure(s): COLONOSCOPY WITH PROPOFOL (N/A)  Patient Location: PACU and Endoscopy Unit  Anesthesia Type:General  Level of Consciousness: sedated, drowsy and patient cooperative  Airway & Oxygen Therapy: Patient Spontanous Breathing and Patient connected to nasal cannula oxygen  Post-op Assessment: Report given to RN, Post -op Vital signs reviewed and stable and Patient moving all extremities  Post vital signs: Reviewed and stable  Last Vitals:  Vitals:   02/20/17 0816 02/20/17 0900  BP: 132/71 102/65  Pulse: 75 76  Resp: 20 20  Temp: 37.3 C (!) 36.2 C  SpO2: 100% 100%    Last Pain:  Vitals:   02/20/17 0900  TempSrc: Tympanic      Patients Stated Pain Goal: 0 (72/90/21 1155)  Complications: No apparent anesthesia complications

## 2017-02-20 NOTE — Anesthesia Post-op Follow-up Note (Signed)
Anesthesia QCDR form completed.        

## 2017-02-21 ENCOUNTER — Encounter: Payer: Self-pay | Admitting: Unknown Physician Specialty

## 2017-02-21 LAB — SURGICAL PATHOLOGY

## 2017-10-15 DIAGNOSIS — Z86018 Personal history of other benign neoplasm: Secondary | ICD-10-CM | POA: Diagnosis not present

## 2017-10-15 DIAGNOSIS — N950 Postmenopausal bleeding: Secondary | ICD-10-CM | POA: Diagnosis not present

## 2017-10-30 ENCOUNTER — Other Ambulatory Visit: Payer: Self-pay | Admitting: Internal Medicine

## 2017-10-30 DIAGNOSIS — Z1231 Encounter for screening mammogram for malignant neoplasm of breast: Secondary | ICD-10-CM

## 2017-11-27 ENCOUNTER — Ambulatory Visit
Admission: RE | Admit: 2017-11-27 | Discharge: 2017-11-27 | Disposition: A | Discharge status: Home / Self Care | Payer: PPO | Source: Ambulatory Visit | Attending: Internal Medicine | Admitting: Internal Medicine

## 2017-11-27 DIAGNOSIS — Z1231 Encounter for screening mammogram for malignant neoplasm of breast: Secondary | ICD-10-CM | POA: Diagnosis not present

## 2017-12-18 DIAGNOSIS — N950 Postmenopausal bleeding: Secondary | ICD-10-CM | POA: Diagnosis not present

## 2018-01-13 DIAGNOSIS — N950 Postmenopausal bleeding: Secondary | ICD-10-CM | POA: Diagnosis not present

## 2018-01-16 ENCOUNTER — Encounter
Admission: RE | Admit: 2018-01-16 | Discharge: 2018-01-16 | Disposition: A | Discharge status: Home / Self Care | Payer: PPO | Source: Ambulatory Visit | Attending: Obstetrics and Gynecology | Admitting: Obstetrics and Gynecology

## 2018-01-16 ENCOUNTER — Other Ambulatory Visit: Payer: Self-pay

## 2018-01-16 DIAGNOSIS — R9431 Abnormal electrocardiogram [ECG] [EKG]: Secondary | ICD-10-CM | POA: Diagnosis not present

## 2018-01-16 DIAGNOSIS — N950 Postmenopausal bleeding: Secondary | ICD-10-CM | POA: Diagnosis not present

## 2018-01-16 DIAGNOSIS — I10 Essential (primary) hypertension: Secondary | ICD-10-CM | POA: Insufficient documentation

## 2018-01-16 DIAGNOSIS — Z01812 Encounter for preprocedural laboratory examination: Secondary | ICD-10-CM | POA: Insufficient documentation

## 2018-01-16 DIAGNOSIS — Z01810 Encounter for preprocedural cardiovascular examination: Secondary | ICD-10-CM | POA: Diagnosis not present

## 2018-01-16 HISTORY — DX: Anxiety disorder, unspecified: F41.9

## 2018-01-16 LAB — BASIC METABOLIC PANEL
Anion gap: 7 (ref 5–15)
BUN: 16 mg/dL (ref 8–23)
CO2: 28 mmol/L (ref 22–32)
Calcium: 9.4 mg/dL (ref 8.9–10.3)
Chloride: 105 mmol/L (ref 98–111)
Creatinine, Ser: 0.8 mg/dL (ref 0.44–1.00)
GFR calc non Af Amer: >60 mL/min (ref >60)
Glucose, Bld: 80 mg/dL (ref 70–99)
POTASSIUM: 3.9 mmol/L (ref 3.5–5.1)
Sodium: 140 mmol/L (ref 135–145)

## 2018-01-16 LAB — TYPE AND SCREEN
ABO/RH(D): O POS
Antibody Screen: NEGATIVE

## 2018-01-16 LAB — CBC
HEMATOCRIT: 43.8 % (ref 35.0–47.0)
Hemoglobin: 15.3 g/dL (ref 12.0–16.0)
MCH: 32.1 pg (ref 26.0–34.0)
MCHC: 35 g/dL (ref 32.0–36.0)
MCV: 91.7 fL (ref 80.0–100.0)
PLATELETS: 249 10*3/uL (ref 150–440)
RBC: 4.78 MIL/uL (ref 3.80–5.20)
RDW: 12.5 % (ref 11.5–14.5)
WBC: 4.9 10*3/uL (ref 3.6–11.0)

## 2018-01-16 NOTE — H&P (Signed)
Patricia Miller is a 72 y.o. female here for Pre Op Consulting (sign consents) . HPI:  Pt presents for a preoperative visit to schedule a D&C, hysteroscopy, possibly polypectomy and dx lap.  She has a hx of: postmenopausal bleeding with and SIS with likely polyp and fibroid in endometrium. She also has an unusual vascularity on the left side of the cervix that I will evaluate with dx lap.  Workup has included:  Fibroid ut seen Ut= 8.85 x 5.25 x 7.25 cm 1 lt lat at cx=28 mm 2 post=40 mm 3 fundal post=29 mm 4 calcified fundal=30 mm 5 Rt ant=19 mm  Endometrium lower end= 4.03 mm  Lt ov wnl  Rt ov not seen   Hx of  07/2015 D&C: lots of fibroids, some removed, normal pathology.   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  Past Surgical History:  has a past surgical history that includes btl; Tubal ligation; Colonoscopy (08/01/2003); Colonoscopy (03/15/2011); and Colonoscopy (02/20/2017). Family History: family history includes Cancer in her sister; Colon cancer in her maternal aunt; Colon polyps in her mother; Diverticulosis in her mother; No Known Problems in her father. Social History:  reports that she quit smoking about 25 years ago. She has never used smokeless tobacco. She reports that she does not drink alcohol or use drugs. OB/GYN History:          OB History    Gravida  1   Para  1   Term  1   Preterm      AB      Living  1     SAB      TAB      Ectopic      Molar      Multiple      Live Births             Allergies: has No Known Allergies. Medications:  Current Outpatient Medications:  .  buPROPion (WELLBUTRIN XL) 150 MG XL tablet, Take 1 tablet (150 mg total) by mouth once daily. (Patient not taking:  Reported on 10/15/2017 ), Disp: 30 tablet, Rfl: 11 .  calcium carbonate-vitamin D3 (OS-CAL 500+D) 500 mg(1,250mg ) -200 unit tablet, Take 1 tablet by mouth 2 (two) times daily with meals., Disp: , Rfl:  .  cetirizine (ZYRTEC) 10 mg capsule, Take 10 mg by mouth once daily., Disp: , Rfl:  .  cholecalciferol (CHOLECALCIFEROL) 1,000 unit tablet, Take by mouth once daily., Disp: , Rfl:  .  cyanocobalamin (VITAMIN B12) 1000 MCG tablet, Take 1,000 mcg by mouth once daily., Disp: , Rfl:  .  hydroCHLOROthiazide (HYDRODIURIL) 25 MG tablet, TAKE 1 TABLET (25 MG TOTAL) BY MOUTH ONCE DAILY., Disp: 90 tablet, Rfl: 0 .  ibuprofen (ADVIL,MOTRIN) 600 MG tablet, take 1 tablet by mouth every 6 hours if needed, Disp: 60 tablet, Rfl: 0 .  LUTEIN ORAL, Take 40 mg by mouth., Disp: , Rfl:  .  prasterone, DHEA, (DHEA ORAL), Take 50 mg by mouth., Disp: , Rfl:  .  PREMPRO 0.625-5 mg tablet, TAKE 1 TABLET BY MOUTH EVERY DAY, Disp: 84 tablet, Rfl: 0 .  sertraline (ZOLOFT) 50 MG tablet, take 1 tablet by mouth once daily (Patient not taking: Reported on 10/15/2017), Disp: 90 tablet, Rfl: 1 .  turmeric, bulk, 95 % Powd, Use., Disp: , Rfl:   Review of Systems: No SOB,  no palpitations or chest pain, no new lower extremity edema, no nausea or vomiting or bowel or bladder complaints. See HPI for gyn specific ROS.   Exam:      Vitals:   01/16/18 1309  BP: 120/80    WDWN   female in NAD Body mass index is 25.29 kg/m.  General: Patient is well-groomed, well-nourished, appears stated age in no acute distress  HEENT: head is atraumatic and normocephalic, trachea is midline, neck is supple with no palpable nodules  CV: Regular rhythm and normal heart rate, no murmur  Pulm: Clear to auscultation throughout lung fields with no wheezing, crackles, or rhonchi. No increased work of breathing  Abdomen: soft , no mass, non-tender, no rebound tenderness, no hepatomegaly  Pelvic: Deferred  Impression:   The encounter  diagnosis was PMB (postmenopausal bleeding).    Plan:   -  Preoperative visit: D&C hysteroscopy, polypectomy and dx lap. Consents signed today.  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   Risks of surgery were discussed with the patient including but not limited to: bleeding which may require transfusion; infection which may require antibiotics; injury to uterus or surrounding organs; intrauterine scarring which may impair future fertility; need for additional procedures including laparotomy or laparoscopy; and other postoperative/anesthesia complications. Written informed consent was obtained.  This is a scheduled same-day surgery. She will have a postop visit in 2 weeks to review operative findings and pathology. Return in about 2 weeks (around 01/30/2018).

## 2018-01-16 NOTE — Patient Instructions (Signed)
Your procedure is scheduled on: Friday 01/24/18  Report to [place]. To find out your arrival time please call [phone] between 1PM - 3PM on Thursday 01/23/18  Remember: Instructions that are not followed completely may result in serious medical risk, up to and including death, or upon the discretion of your surgeon and anesthesiologist your surgery may need to be rescheduled.     _X__ 1. Do not eat food after midnight the night before your procedure.                 No gum chewing or hard candies. You may drink clear liquids up to 2 hours                 before you are scheduled to arrive for your surgery- DO not drink clear                 liquids within 2 hours of the start of your surgery.                 Clear Liquids include:  water, apple juice without pulp, clear carbohydrate                 drink such as Clearfast or Gatorade, Black Coffee or Tea (Do not add                 anything to coffee or tea).  __X__2.  On the morning of surgery brush your teeth with toothpaste and water, you may rinse your mouth with mouthwash if you wish.  Do not swallow any toothpaste of mouthwash.     _X__ 3.  No Alcohol for 24 hours before or after surgery.   _X__ 4.  Do Not Smoke or use e-cigarettes For 24 Hours Prior to Your Surgery.                 Do not use any chewable tobacco products for at least 6 hours prior to                 surgery.  ____  5.  Bring all medications with you on the day of surgery if instructed.   __X__  6.  Notify your doctor if there is any change in your medical condition      (cold, fever, infections).     Do not wear jewelry, make-up, hairpins, clips or nail polish. Do not wear lotions, powders, or perfumes.  Do not shave 48 hours prior to surgery. Men may shave face and neck. Do not bring valuables to the hospital.    [facility] is not responsible for any belongings or valuables.  Contacts,  dentures/partials or body piercings may not be worn into surgery. Bring a case for your contacts, glasses or hearing aids, a denture cup will be supplied. Leave your suitcase in the car. After surgery it may be brought to your room. For patients admitted to the hospital, discharge time is determined by your treatment team.   Patients discharged the day of surgery will not be allowed to drive home.   Please read over the following fact sheets that you were given:   MRSA Information  __X__ Take these medicines the morning of surgery with A SIP OF WATER:     1.buPROPion (WELLBUTRIN XL) 150 MG 24 hr tablet  2. cetirizine (ZYRTEC) 10 MG tablet  3.   4.  5.  6.  __X__ Use CHG Soap/SAGE wipes as directed  __X__ Stop Anti-inflammatories 7 days before surgery (01/17/18) such as Advil, Ibuprofen, Motrin, BC or Goodies Powder, Naprosyn, Naproxen, Aleve, Aspirin, Meloxicam. May take Tylenol if needed for pain or discomfort.   __X__ Stop all herbal supplements, Biotin, Vitamin D, Lutien TODAY.

## 2018-01-16 NOTE — OR Nursing (Signed)
Spoke with Dr. Ronelle Nigh regarding patient's EKG. No new orders.

## 2018-01-24 ENCOUNTER — Other Ambulatory Visit: Payer: Self-pay

## 2018-01-24 ENCOUNTER — Encounter: Payer: Self-pay | Admitting: *Deleted

## 2018-01-24 ENCOUNTER — Ambulatory Visit: Payer: PPO | Admitting: Anesthesiology

## 2018-01-24 ENCOUNTER — Encounter
Admission: RE | Disposition: A | Discharge status: Home / Self Care | Payer: Self-pay | Source: Ambulatory Visit | Attending: Obstetrics and Gynecology

## 2018-01-24 ENCOUNTER — Ambulatory Visit
Admission: RE | Admit: 2018-01-24 | Discharge: 2018-01-24 | Disposition: A | Discharge status: Home / Self Care | Payer: PPO | Source: Ambulatory Visit | Attending: Obstetrics and Gynecology | Admitting: Obstetrics and Gynecology

## 2018-01-24 DIAGNOSIS — R9389 Abnormal findings on diagnostic imaging of other specified body structures: Secondary | ICD-10-CM | POA: Insufficient documentation

## 2018-01-24 DIAGNOSIS — Z800 Family history of malignant neoplasm of digestive organs: Secondary | ICD-10-CM | POA: Insufficient documentation

## 2018-01-24 DIAGNOSIS — F419 Anxiety disorder, unspecified: Secondary | ICD-10-CM | POA: Diagnosis not present

## 2018-01-24 DIAGNOSIS — D250 Submucous leiomyoma of uterus: Secondary | ICD-10-CM | POA: Diagnosis not present

## 2018-01-24 DIAGNOSIS — N950 Postmenopausal bleeding: Secondary | ICD-10-CM | POA: Insufficient documentation

## 2018-01-24 DIAGNOSIS — Z87891 Personal history of nicotine dependence: Secondary | ICD-10-CM | POA: Insufficient documentation

## 2018-01-24 DIAGNOSIS — I10 Essential (primary) hypertension: Secondary | ICD-10-CM | POA: Insufficient documentation

## 2018-01-24 DIAGNOSIS — N840 Polyp of corpus uteri: Secondary | ICD-10-CM | POA: Diagnosis not present

## 2018-01-24 DIAGNOSIS — Z809 Family history of malignant neoplasm, unspecified: Secondary | ICD-10-CM | POA: Insufficient documentation

## 2018-01-24 DIAGNOSIS — N858 Other specified noninflammatory disorders of uterus: Secondary | ICD-10-CM | POA: Diagnosis not present

## 2018-01-24 DIAGNOSIS — Z79899 Other long term (current) drug therapy: Secondary | ICD-10-CM | POA: Insufficient documentation

## 2018-01-24 DIAGNOSIS — Z85828 Personal history of other malignant neoplasm of skin: Secondary | ICD-10-CM | POA: Insufficient documentation

## 2018-01-24 DIAGNOSIS — D251 Intramural leiomyoma of uterus: Secondary | ICD-10-CM | POA: Diagnosis not present

## 2018-01-24 DIAGNOSIS — D259 Leiomyoma of uterus, unspecified: Secondary | ICD-10-CM | POA: Diagnosis not present

## 2018-01-24 DIAGNOSIS — Z8379 Family history of other diseases of the digestive system: Secondary | ICD-10-CM | POA: Insufficient documentation

## 2018-01-24 HISTORY — PX: LAPAROSCOPY: SHX197

## 2018-01-24 HISTORY — PX: HYSTEROSCOPY WITH D & C: SHX1775

## 2018-01-24 SURGERY — LAPAROSCOPY, DIAGNOSTIC
Anesthesia: General

## 2018-01-24 MED ORDER — OXYCODONE HCL 5 MG PO TABS
ORAL_TABLET | ORAL | Status: AC
  Filled 2018-01-24: qty 1

## 2018-01-24 MED ORDER — OXYCODONE HCL 5 MG PO TABS: 5.0000 mg | ORAL_TABLET | ORAL | 0 refills | Status: DC | PRN

## 2018-01-24 MED ORDER — PROPOFOL 10 MG/ML IV BOLUS
INTRAVENOUS | Status: DC | PRN
  Administered 2018-01-24: 120 mg via INTRAVENOUS

## 2018-01-24 MED ORDER — ACETAMINOPHEN 500 MG PO TABS: 1000.0000 mg | ORAL_TABLET | Freq: Four times a day (QID) | ORAL | 0 refills | Status: AC

## 2018-01-24 MED ORDER — LACTATED RINGERS IV SOLN
INTRAVENOUS | Status: DC
  Administered 2018-01-24 (×2): via INTRAVENOUS

## 2018-01-24 MED ORDER — BUPIVACAINE HCL 0.5 % IJ SOLN
INTRAMUSCULAR | Status: DC | PRN
  Administered 2018-01-24: 4 mL

## 2018-01-24 MED ORDER — PHENYLEPHRINE HCL 10 MG/ML IJ SOLN
INTRAMUSCULAR | Status: DC | PRN
  Administered 2018-01-24 (×5): 100 ug via INTRAVENOUS

## 2018-01-24 MED ORDER — DOCUSATE SODIUM 100 MG PO CAPS: 100.0000 mg | ORAL_CAPSULE | Freq: Two times a day (BID) | ORAL | 0 refills | Status: DC

## 2018-01-24 MED ORDER — FENTANYL CITRATE (PF) 100 MCG/2ML IJ SOLN
INTRAMUSCULAR | Status: AC
  Filled 2018-01-24: qty 2

## 2018-01-24 MED ORDER — GABAPENTIN 800 MG PO TABS: 800.0000 mg | ORAL_TABLET | Freq: Every day | ORAL | 0 refills | Status: DC

## 2018-01-24 MED ORDER — DEXAMETHASONE SODIUM PHOSPHATE 10 MG/ML IJ SOLN
INTRAMUSCULAR | Status: DC | PRN
  Administered 2018-01-24: 10 mg via INTRAVENOUS

## 2018-01-24 MED ORDER — ONDANSETRON HCL 4 MG/2ML IJ SOLN
INTRAMUSCULAR | Status: DC | PRN
  Administered 2018-01-24: 4 mg via INTRAVENOUS

## 2018-01-24 MED ORDER — FENTANYL CITRATE (PF) 100 MCG/2ML IJ SOLN
INTRAMUSCULAR | Status: DC | PRN
  Administered 2018-01-24: 100 ug via INTRAVENOUS

## 2018-01-24 MED ORDER — LIDOCAINE HCL (CARDIAC) PF 100 MG/5ML IV SOSY
PREFILLED_SYRINGE | INTRAVENOUS | Status: DC | PRN
  Administered 2018-01-24: 60 mg via INTRAVENOUS

## 2018-01-24 MED ORDER — CELECOXIB 200 MG PO CAPS
400.0000 mg | ORAL_CAPSULE | ORAL | Status: AC
  Administered 2018-01-24: 400 mg via ORAL

## 2018-01-24 MED ORDER — ACETAMINOPHEN 500 MG PO TABS
ORAL_TABLET | ORAL | Status: AC
  Administered 2018-01-24: 1000 mg via ORAL
  Filled 2018-01-24: qty 2

## 2018-01-24 MED ORDER — GABAPENTIN 300 MG PO CAPS
900.0000 mg | ORAL_CAPSULE | ORAL | Status: AC
  Administered 2018-01-24: 900 mg via ORAL

## 2018-01-24 MED ORDER — TRANEXAMIC ACID 1000 MG/10ML IV SOLN
INTRAVENOUS | Status: AC
Start: 2018-01-24 — End: ?
  Filled 2018-01-24: qty 10

## 2018-01-24 MED ORDER — MIDAZOLAM HCL 2 MG/2ML IJ SOLN
INTRAMUSCULAR | Status: AC
  Filled 2018-01-24: qty 2

## 2018-01-24 MED ORDER — CELECOXIB 200 MG PO CAPS
ORAL_CAPSULE | ORAL | Status: AC
  Administered 2018-01-24: 400 mg via ORAL
  Filled 2018-01-24: qty 2

## 2018-01-24 MED ORDER — ACETAMINOPHEN 500 MG PO TABS
1000.0000 mg | ORAL_TABLET | ORAL | Status: AC
  Administered 2018-01-24: 1000 mg via ORAL

## 2018-01-24 MED ORDER — SUGAMMADEX SODIUM 200 MG/2ML IV SOLN
INTRAVENOUS | Status: DC | PRN
  Administered 2018-01-24: 200 mg via INTRAVENOUS

## 2018-01-24 MED ORDER — FAMOTIDINE 20 MG PO TABS
ORAL_TABLET | ORAL | Status: AC
  Administered 2018-01-24: 20 mg via ORAL
  Filled 2018-01-24: qty 1

## 2018-01-24 MED ORDER — IBUPROFEN 800 MG PO TABS: 800.0000 mg | ORAL_TABLET | Freq: Three times a day (TID) | ORAL | 1 refills | Status: DC | PRN

## 2018-01-24 MED ORDER — BUPIVACAINE HCL (PF) 0.5 % IJ SOLN
INTRAMUSCULAR | Status: AC
  Filled 2018-01-24: qty 30

## 2018-01-24 MED ORDER — SEVOFLURANE IN SOLN
RESPIRATORY_TRACT | Status: AC
  Filled 2018-01-24: qty 250

## 2018-01-24 MED ORDER — ROCURONIUM BROMIDE 100 MG/10ML IV SOLN
INTRAVENOUS | Status: DC | PRN
  Administered 2018-01-24: 20 mg via INTRAVENOUS

## 2018-01-24 MED ORDER — OXYCODONE HCL 5 MG PO TABS
5.0000 mg | ORAL_TABLET | ORAL | Status: DC | PRN
Start: 2018-01-24 — End: 2018-01-24
  Administered 2018-01-24: 5 mg via ORAL

## 2018-01-24 MED ORDER — ONDANSETRON HCL 4 MG/2ML IJ SOLN
INTRAMUSCULAR | Status: AC
  Filled 2018-01-24: qty 2

## 2018-01-24 MED ORDER — FAMOTIDINE 20 MG PO TABS
20.0000 mg | ORAL_TABLET | Freq: Once | ORAL | Status: AC
  Administered 2018-01-24: 20 mg via ORAL

## 2018-01-24 MED ORDER — GABAPENTIN 300 MG PO CAPS
ORAL_CAPSULE | ORAL | Status: AC
  Administered 2018-01-24: 900 mg via ORAL
  Filled 2018-01-24: qty 3

## 2018-01-24 SURGICAL SUPPLY — 47 items
ABLATOR ENDOMETRIAL MYOSURE (ABLATOR) ×3 IMPLANT
BAG INFUSER PRESSURE 100CC (MISCELLANEOUS) IMPLANT
BAG URINE DRAINAGE (UROLOGICAL SUPPLIES) ×3 IMPLANT
BLADE SURG SZ11 CARB STEEL (BLADE) ×3 IMPLANT
CANISTER SUCT 3000ML PPV (MISCELLANEOUS) ×3 IMPLANT
CATH FOLEY 2WAY  5CC 16FR (CATHETERS) ×2
CATH ROBINSON RED A/P 16FR (CATHETERS) ×3 IMPLANT
CATH URTH 16FR FL 2W BLN LF (CATHETERS) ×1 IMPLANT
CHLORAPREP W/TINT 26ML (MISCELLANEOUS) ×3 IMPLANT
CLOSURE WOUND 1/4X4 (GAUZE/BANDAGES/DRESSINGS) ×1
DERMABOND ADVANCED (GAUZE/BANDAGES/DRESSINGS) ×2
DERMABOND ADVANCED .7 DNX12 (GAUZE/BANDAGES/DRESSINGS) ×1 IMPLANT
ELECT REM PT RETURN 9FT ADLT (ELECTROSURGICAL) ×3
ELECTRODE REM PT RTRN 9FT ADLT (ELECTROSURGICAL) ×1 IMPLANT
GLOVE BIO SURGEON STRL SZ7 (GLOVE) ×12 IMPLANT
GLOVE INDICATOR 7.5 STRL GRN (GLOVE) ×12 IMPLANT
GOWN STRL REUS W/ TWL LRG LVL3 (GOWN DISPOSABLE) ×2 IMPLANT
GOWN STRL REUS W/TWL LRG LVL3 (GOWN DISPOSABLE) ×4
IRRIGATION STRYKERFLOW (MISCELLANEOUS) IMPLANT
IRRIGATOR STRYKERFLOW (MISCELLANEOUS)
IV LACTATED RINGER IRRG 3000ML (IV SOLUTION) ×4
IV LACTATED RINGERS 1000ML (IV SOLUTION) IMPLANT
IV LR IRRIG 3000ML ARTHROMATIC (IV SOLUTION) ×2 IMPLANT
IV NS 1000ML (IV SOLUTION)
IV NS 1000ML BAXH (IV SOLUTION) IMPLANT
KIT PINK PAD W/HEAD ARE REST (MISCELLANEOUS) ×3
KIT PINK PAD W/HEAD ARM REST (MISCELLANEOUS) ×1 IMPLANT
KIT TURNOVER CYSTO (KITS) ×3 IMPLANT
LABEL OR SOLS (LABEL) ×3 IMPLANT
NS IRRIG 500ML POUR BTL (IV SOLUTION) ×3 IMPLANT
PACK DNC HYST (MISCELLANEOUS) IMPLANT
PACK GYN LAPAROSCOPIC (MISCELLANEOUS) ×3 IMPLANT
PAD OB MATERNITY 4.3X12.25 (PERSONAL CARE ITEMS) ×3 IMPLANT
PAD PREP 24X41 OB/GYN DISP (PERSONAL CARE ITEMS) ×3 IMPLANT
POUCH SPECIMEN RETRIEVAL 10MM (ENDOMECHANICALS) IMPLANT
SCISSORS METZENBAUM CVD 33 (INSTRUMENTS) IMPLANT
SLEEVE ENDOPATH XCEL 5M (ENDOMECHANICALS) ×3 IMPLANT
STRIP CLOSURE SKIN 1/4X4 (GAUZE/BANDAGES/DRESSINGS) ×2 IMPLANT
SUT MNCRL AB 4-0 PS2 18 (SUTURE) ×3 IMPLANT
SUT VIC AB 2-0 UR6 27 (SUTURE) IMPLANT
SUT VIC AB 4-0 SH 27 (SUTURE)
SUT VIC AB 4-0 SH 27XANBCTRL (SUTURE) IMPLANT
TROCAR XCEL NON-BLD 5MMX100MML (ENDOMECHANICALS) IMPLANT
TUBING CONNECTING 10 (TUBING) ×2 IMPLANT
TUBING CONNECTING 10' (TUBING) ×1
TUBING HYSTEROSCOPY DOLPHIN (MISCELLANEOUS) ×3 IMPLANT
TUBING INSUFFLATION (TUBING) ×3 IMPLANT

## 2018-01-24 NOTE — Op Note (Addendum)
Operative Report Hysteroscopy with Dilation and Curettage   Indications: Postmenopausal bleeding    Pre-operative Diagnosis:  Fibroid uterus  Thickened endometrial stripe  Post-operative Diagnosis:  Submucosal fibroid.  Procedure: 1. Exam under anesthesia 2. Fractional D&C 3. Hysteroscopy 4. Laparoscopic myomectomy 5. Dx laparoscopy  Surgeon: Benjaman Kindler, MD  Assistant(s):  None  Anesthesia: General LMA anesthesia  Anesthesiologist: Gunnar Fusi, MD Anesthesiologist: Gunnar Fusi, MD; Emmie Niemann, MD CRNA: Leander Rams, CRNA  Estimated Blood Loss:  30ml         Intraoperative medications: none         Total IV Fluids: 84ml  Urine Output: 1ml  Total Fluid Deficit:  550 mL          Specimens: Endocervical curettings, endometrial curettings         Complications:  None; patient tolerated the procedure well.         Disposition: PACU - hemodynamically stable.         Condition: stable  Findings: Lap findings: Irregular borders with multiple pedunculated and intramural fibroids. Large left lateral vessels at the level of the internal os to feed fibroids, but no irregular vessels or findings concerning for malignancy. Hysteroscopic findings: Cavity completely filled with large multi-loculated fibroid that appeared to be connected both anteriorly, posteriorly and fundally, and all edges were not able to be seen. It was vascular, and a small biopsy with the Myosure caused significant bleeding. The fibroid was not resected in its entirety, but no areas of malignancy were visible. The surrounding endometrium was atrophic.   Uterus measuring 9 cm by sound; normal cervix, vagina, perineum.   Indication for procedure/Consents: 72 y.o. here for scheduled surgery for the aforementioned diagnoses.   Risks of surgery were discussed with the patient including but not limited to: bleeding which may require transfusion; infection which may require antibiotics;  injury to uterus or surrounding organs; intrauterine scarring which may impair future fertility; need for additional procedures including laparotomy or laparoscopy; and other postoperative/anesthesia complications. Written informed consent was obtained.    Procedure Details:   D&C/ Myosure  The patient was taken to the operating room where anesthesia was administered and was found to be adequate. After a formal and adequate timeout was performed, she was placed in the dorsal lithotomy position and examined with the above findings. She was then prepped and draped in the sterile manner. Her bladder was catheterized for an estimated amount of clear, yellow urine. A weighed speculum was then placed in the patient's vagina and a single tooth tenaculum was applied to the anterior lip of the cervix. A sponge stick was placed in the vagina.  Attention was turned to the abdomen. 95ml of 0.5% bupivicaine was instilled under the skin, and a 27mm incision was made in the umbilicus. A 39mm trocar was carefully introduced under direct visualization, and examination noted an irregular uterus, normal though robust left uterine vascularity, an no other abnormalities.   Attention was returned to the perineum.  Her cervix was serially dilated to 15 [place] using Hanks dilators. An ECC was performed. The hysteroscope was introduced under direct observation using lactated ringers as a distention medium to reveal the above findings. The uterine cavity was carefully examined, left ostia was recognized, and the fibroid occluded the rest of the cavity. This was sampled using the Myosure device, but care taken to avoid damage to the uterus itself and the procedure necessarily truncated by excessive bleeding.  After further careful visualization of the uterine  cavity, the hysteroscope was removed under direct visualization. Consideration of txa was given, but she did stop bleeding prior to the close of the procedure, with an EBL of  23ml.   The tenaculum was removed from the anterior lip of the cervix and the vaginal speculum was removed after applying silver nitrate for good hemostasis.   The patient tolerated the procedure well and was taken to the recovery area awake and in stable condition. She received ERAS po med prior to procedure.  The patient will be discharged to home as per PACU criteria. Routine postoperative instructions given. She was prescribed Ibuprofen, gabapentin and tylenol, and Colace. She will follow up in the clinic in two weeks for postoperative evaluation.  If pathology is benign, I will discuss with her that she will likely continue to bleed intermittently from this fibroid and that a hysterectomy would be recommended to definitively solve that problem. However, no evidence of malignancy today, and that decision will be up to her.

## 2018-01-24 NOTE — Interval H&P Note (Signed)
History and Physical Interval Note:  01/24/2018 2:08 PM  Patricia Miller  has presented today for surgery, with the diagnosis of postmenopausal bleeding, endometrial polyp, left pelvic vascularity  The various methods of treatment have been discussed with the patient and family. After consideration of risks, benefits and other options for treatment, the patient has consented to  Procedure(s): LAPAROSCOPY DIAGNOSTIC (N/A) DILATATION AND CURETTAGE /HYSTEROSCOPY (N/A) as a surgical intervention .  The patient's history has been reviewed, patient examined, no change in status, stable for surgery.  I have reviewed the patient's chart and labs.  Questions were answered to the patient's satisfaction.     Benjaman Kindler

## 2018-01-24 NOTE — Discharge Instructions (Signed)
Laparoscopic Ovarian Surgery Discharge Instructions  For the next three days, take ibuprofen and acetaminophen on a schedule, every 8 hours. You can take them together or you can intersperse them, and take one every four hours. I also gave you gabapentin for nighttime, to help you sleep and also to control pain. Take gabapentin medicines at night for at least the next 3 nights. You also have a narcotic, oxycodone, to take as needed if the above medicines don't help.  Postop constipation is a major cause of pain. Stay well hydrated, walk as you tolerate, and take over the counter senna as well as stool softeners if you need them.   RISKS AND COMPLICATIONS  Infection. Bleeding. Injury to surrounding organs. Anesthetic side effects.   PROCEDURE  You may be given a medicine to help you relax (sedative) before the procedure. You will be given a medicine to make you sleep (general anesthetic) during the procedure. A tube will be put down your throat to help your breath while under general anesthesia. Several small cuts (incisions) are made in the lower abdominal area and one incision is made near the belly button. Your abdominal area will be inflated with a safe gas (carbon dioxide). This helps give the surgeon room to operate, visualize, and helps the surgeon avoid other organs. A thin, lighted tube (laparoscope) with a camera attached is inserted into your abdomen through the incision near the belly button. Other small instruments may also be inserted through other abdominal incisions. The ovary is located and are removed. After the ovary is removed, the gas is released from the abdomen. The incisions will be closed with stitches (sutures), and Dermabond. A bandage may be placed over the incisions.  AFTER THE PROCEDURE  You will also have some mild abdominal discomfort for 3-7 days. You will be given pain medicine to ease any discomfort. As long as there are no problems, you may be allowed to  go home. Someone will need to drive you home and be with you for at least 24 hours once home. You may have some mild discomfort in the throat. This is from the tube placed in your throat while you were sleeping. You may experience discomfort in the shoulder area from some trapped air between the liver and diaphragm. This sensation is normal and will slowly go away on its own.  HOME CARE INSTRUCTIONS  Take all medicines as directed. Only take over-the-counter or prescription medicines for pain, discomfort, or fever as directed by your caregiver. Resume daily activities as directed. Showers are preferred over baths for 2 weeks. You may resume sexual activities in 1 week or as you feel you would like to. Do not drive while taking narcotics.  SEEK MEDICAL CARE IF: . There is increasing abdominal pain. You feel lightheaded or faint. You have the chills. You have an oral temperature above 102 F (38.9 C). There is pus-like (purulent) drainage from any of the wounds. You are unable to pass gas or have a bowel movement. You feel sick to your stomach (nauseous) or throw up (vomit) and can't control it with your medicines.  MAKE SURE YOU:  Understand these instructions. Will watch your condition. Will get help right away if you are not doing well or get worse.  [employer] Patient Information 2013 [employer].      

## 2018-01-24 NOTE — Anesthesia Preprocedure Evaluation (Signed)
Anesthesia Evaluation  Patient identified by MRN, date of birth, ID band Patient awake    Reviewed: Allergy & Precautions, NPO status , Patient's Chart, lab work & pertinent test results  History of Anesthesia Complications Negative for: history of anesthetic complications  Airway Mallampati: II  TM Distance: >3 FB Neck ROM: Full    Dental no notable dental hx.    Pulmonary neg sleep apnea, neg COPD, former smoker,    breath sounds clear to auscultation- rhonchi (-) wheezing      Cardiovascular Exercise Tolerance: Good hypertension, Pt. on medications (-) CAD, (-) Past MI, (-) Cardiac Stents and (-) CABG  Rhythm:Regular Rate:Normal - Systolic murmurs and - Diastolic murmurs    Neuro/Psych Anxiety negative neurological ROS     GI/Hepatic negative GI ROS, Neg liver ROS,   Endo/Other  negative endocrine ROSneg diabetes  Renal/GU negative Renal ROS     Musculoskeletal  (+) Arthritis ,   Abdominal (+) - obese,   Peds  Hematology negative hematology ROS (+)   Anesthesia Other Findings Past Medical History: No date: Anxiety No date: Arthritis     Comment:  NECK No date: Cancer ([place])     Comment:  BASAL CELL AND MELANOMA No date: Hypertension   Reproductive/Obstetrics                             Anesthesia Physical Anesthesia Plan  ASA: II  Anesthesia Plan: General   Post-op Pain Management:    Induction: Intravenous  PONV Risk Score and Plan: 2 and Ondansetron, Dexamethasone and Midazolam  Airway Management Planned: LMA  Additional Equipment:   Intra-op Plan:   Post-operative Plan:   Informed Consent: I have reviewed the patients History and Physical, chart, labs and discussed the procedure including the risks, benefits and alternatives for the proposed anesthesia with the patient or authorized representative who has indicated his/her understanding and acceptance.   Dental  advisory given  Plan Discussed with: CRNA and Anesthesiologist  Anesthesia Plan Comments:         Anesthesia Quick Evaluation

## 2018-01-24 NOTE — Anesthesia Postprocedure Evaluation (Signed)
Anesthesia Post Note  Patient: Trace Cederberg Reddin  Procedure(s) Performed: LAPAROSCOPY DIAGNOSTIC (N/A ) DILATATION AND CURETTAGE /HYSTEROSCOPY (N/A )  Patient location during evaluation: PACU Anesthesia Type: General Level of consciousness: awake and alert Pain management: pain level controlled Vital Signs Assessment: post-procedure vital signs reviewed and stable Respiratory status: spontaneous breathing and respiratory function stable Cardiovascular status: stable Anesthetic complications: no     Last Vitals:  Vitals:   01/24/18 1653 01/24/18 1709  BP: 114/89 (!) 143/92  Pulse:  89  Resp:    Temp:  36.4 C  SpO2:      Last Pain:  Vitals:   01/24/18 1709  TempSrc:   PainSc: 4                  KEPHART,WILLIAM K

## 2018-01-24 NOTE — Anesthesia Post-op Follow-up Note (Signed)
Anesthesia QCDR form completed.        

## 2018-01-24 NOTE — Transfer of Care (Signed)
Immediate Anesthesia Transfer of Care Note  Patient: Patricia Miller  Procedure(s) Performed: LAPAROSCOPY DIAGNOSTIC (N/A ) DILATATION AND CURETTAGE /HYSTEROSCOPY (N/A )  Patient Location: PACU  Anesthesia Type:General  Level of Consciousness: awake  Airway & Oxygen Therapy: Patient Spontanous Breathing  Post-op Assessment: Report given to RN  Post vital signs: stable  Last Vitals:  Vitals Value Taken Time  BP 133/76 01/24/2018  4:14 PM  Temp    Pulse 81 01/24/2018  4:17 PM  Resp 12 01/24/2018  4:17 PM  SpO2 100 % 01/24/2018  4:17 PM  Vitals shown include unvalidated device data.  Last Pain:  Vitals:   01/24/18 1612  TempSrc:   PainSc: (P) 0-No pain         Complications: No apparent anesthesia complications

## 2018-01-25 ENCOUNTER — Encounter: Payer: Self-pay | Admitting: Obstetrics and Gynecology

## 2018-01-28 LAB — SURGICAL PATHOLOGY

## 2018-02-03 DIAGNOSIS — Z79899 Other long term (current) drug therapy: Secondary | ICD-10-CM | POA: Diagnosis not present

## 2018-02-03 DIAGNOSIS — Z0000 Encounter for general adult medical examination without abnormal findings: Secondary | ICD-10-CM | POA: Insufficient documentation

## 2018-02-03 DIAGNOSIS — Z8589 Personal history of malignant neoplasm of other organs and systems: Secondary | ICD-10-CM

## 2018-05-13 DIAGNOSIS — R300 Dysuria: Secondary | ICD-10-CM | POA: Diagnosis not present

## 2018-06-24 DIAGNOSIS — Z86018 Personal history of other benign neoplasm: Secondary | ICD-10-CM | POA: Diagnosis not present

## 2018-06-24 DIAGNOSIS — N950 Postmenopausal bleeding: Secondary | ICD-10-CM | POA: Diagnosis not present

## 2018-07-07 ENCOUNTER — Other Ambulatory Visit: Payer: PPO

## 2018-07-09 ENCOUNTER — Inpatient Hospital Stay: Admission: RE | Admit: 2018-07-09 | Payer: PPO | Source: Ambulatory Visit

## 2018-07-18 ENCOUNTER — Ambulatory Visit: Admit: 2018-07-18 | Payer: PPO | Admitting: Obstetrics and Gynecology

## 2018-07-18 SURGERY — HYSTERECTOMY, TOTAL, LAPAROSCOPIC
Anesthesia: Choice

## 2018-10-27 DIAGNOSIS — I770 Arteriovenous fistula, acquired: Secondary | ICD-10-CM | POA: Diagnosis not present

## 2019-01-23 ENCOUNTER — Other Ambulatory Visit: Payer: Self-pay

## 2019-01-23 DIAGNOSIS — Z20822 Contact with and (suspected) exposure to covid-19: Secondary | ICD-10-CM

## 2019-01-25 LAB — NOVEL CORONAVIRUS, NAA: SARS-CoV-2, NAA: NOT DETECTED

## 2019-02-09 ENCOUNTER — Other Ambulatory Visit: Payer: Self-pay | Admitting: Internal Medicine

## 2019-02-09 DIAGNOSIS — Z1231 Encounter for screening mammogram for malignant neoplasm of breast: Secondary | ICD-10-CM

## 2019-04-21 ENCOUNTER — Other Ambulatory Visit: Payer: Self-pay

## 2019-04-21 DIAGNOSIS — Z20822 Contact with and (suspected) exposure to covid-19: Secondary | ICD-10-CM

## 2019-04-24 LAB — NOVEL CORONAVIRUS, NAA: SARS-CoV-2, NAA: NOT DETECTED

## 2019-05-13 DIAGNOSIS — Z03818 Encounter for observation for suspected exposure to other biological agents ruled out: Secondary | ICD-10-CM | POA: Diagnosis not present

## 2019-05-28 DIAGNOSIS — L309 Dermatitis, unspecified: Secondary | ICD-10-CM | POA: Diagnosis not present

## 2019-07-16 ENCOUNTER — Other Ambulatory Visit: Payer: PPO

## 2019-07-25 ENCOUNTER — Ambulatory Visit: Payer: PPO | Attending: Internal Medicine

## 2019-07-25 DIAGNOSIS — Z23 Encounter for immunization: Secondary | ICD-10-CM

## 2019-07-25 NOTE — Progress Notes (Signed)
   Covid-19 Vaccination Clinic  Name:  Patricia Miller    MRN: [id number] DOB: [birth year]  07/25/2019  [name] was observed post Covid-19 immunization for 15 minutes without incidence. She was provided with Vaccine Information Sheet and instruction to access the V-Safe system.   [name] was instructed to call 911 with any severe reactions post vaccine: Marland Kitchen Difficulty breathing  . Swelling of your face and throat  . A fast heartbeat  . A bad rash all over your body  . Dizziness and weakness    Immunizations Administered    Name Date Dose VIS Date Route   Pfizer COVID-19 Vaccine 07/25/2019 11:02 AM 0.3 mL 05/22/2019 Intramuscular   Manufacturer: [place]   Lot: [id number]   [place]: [id number]

## 2019-08-15 ENCOUNTER — Other Ambulatory Visit: Payer: Self-pay

## 2019-08-15 ENCOUNTER — Ambulatory Visit: Payer: PPO | Attending: Internal Medicine

## 2019-08-15 DIAGNOSIS — Z23 Encounter for immunization: Secondary | ICD-10-CM | POA: Insufficient documentation

## 2019-08-15 NOTE — Progress Notes (Signed)
   Covid-19 Vaccination Clinic  Name:  ORTENCIA YON    MRN: [id number] DOB: 12-17-1945  08/15/2019  Ms. Winters was observed post Covid-19 immunization for 15 minutes without incident. She was provided with Vaccine Information Sheet and instruction to access the V-Safe system.   Ms. Ryman was instructed to call 911 with any severe reactions post vaccine: Marland Kitchen Difficulty breathing  . Swelling of face and throat  . A fast heartbeat  . A bad rash all over body  . Dizziness and weakness   Immunizations Administered    Name Date Dose VIS Date Route   Pfizer COVID-19 Vaccine 08/15/2019 11:27 AM 0.3 mL 05/22/2019 Intramuscular   Manufacturer: [place]   Lot: [id number]   [place]: [id number]

## 2019-10-07 DIAGNOSIS — Z0000 Encounter for general adult medical examination without abnormal findings: Secondary | ICD-10-CM | POA: Diagnosis not present

## 2019-11-24 DIAGNOSIS — Z01818 Encounter for other preprocedural examination: Secondary | ICD-10-CM | POA: Diagnosis not present

## 2019-11-24 DIAGNOSIS — D259 Leiomyoma of uterus, unspecified: Secondary | ICD-10-CM | POA: Diagnosis not present

## 2019-11-24 DIAGNOSIS — N950 Postmenopausal bleeding: Secondary | ICD-10-CM | POA: Diagnosis not present

## 2019-11-30 ENCOUNTER — Other Ambulatory Visit: Payer: Self-pay | Admitting: Obstetrics and Gynecology

## 2019-12-15 DIAGNOSIS — Z0000 Encounter for general adult medical examination without abnormal findings: Secondary | ICD-10-CM | POA: Diagnosis not present

## 2019-12-15 DIAGNOSIS — I770 Arteriovenous fistula, acquired: Secondary | ICD-10-CM | POA: Diagnosis not present

## 2019-12-15 DIAGNOSIS — E782 Mixed hyperlipidemia: Secondary | ICD-10-CM | POA: Diagnosis not present

## 2019-12-15 DIAGNOSIS — Z01818 Encounter for other preprocedural examination: Secondary | ICD-10-CM | POA: Diagnosis not present

## 2019-12-15 DIAGNOSIS — E538 Deficiency of other specified B group vitamins: Secondary | ICD-10-CM | POA: Diagnosis not present

## 2019-12-15 NOTE — H&P (Signed)
Chief Complaint:   Patricia Miller is a 74 y.o. female presenting with Pre Op Consulting  Body mass index is 23.24 kg/m.   History of Present Illness: Patient is an established patient who returns for a preoperative visit for Total Laparoscopic Hysterectomy with Bilateral Salpingo-Oophorectomy. Indications are longstanding postmenopausal bleeding and hx of multiple uterine fibroids. She has been experiencing PMB since 2016 and is s/p multiple endometrial biopsies and ultrasounds. She is s/p D&C hysteroscopy x2, and attempted myomectomy in 2017-2019.  Patient had a preoperative visit in 06/2018 but surgery was moved out until this year due to her daughter suffering from a leg injury last year.  Workup: Pap: 07/2014 neg EMBx 12/2017: No endometrial tissue identified. Minute fragments of benign endocervical glands, mucus, and blood. 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  Past Surgical History:  has a past surgical history that includes btl; Tubal ligation; Colonoscopy (08/01/2003); Colonoscopy (03/15/2011); Colonoscopy (02/20/2017); Dilation and curettage of uterus; and Hysteroscopy. Family History: family history includes Cancer in her sister; Colon cancer in  her maternal aunt; Colon polyps in her mother; Diverticulosis in her mother; No Known Problems in her father. Social History:  reports that she quit smoking about 27 years ago. She has never used smokeless tobacco. She reports that she does not drink alcohol and does not use drugs. OB/GYN History:  OB History    Gravida  1   Para  1   Term  1   Preterm      AB      Living  1     SAB      TAB      Ectopic      Molar      Multiple      Live Births           Allergies: has No Known Allergies. Medications: Current Outpatient Medications:  .  buPROPion (WELLBUTRIN XL) 150 MG XL tablet, Take 1 tablet (150 mg total) by mouth once daily, Disp: 90 tablet, Rfl: 3 .  calcium carbonate-vitamin D3 (OS-CAL 500+D) 500 mg(1,250mg ) -200 unit tablet, Take 1 tablet by mouth 2 (two) times daily with meals., Disp: , Rfl:  .  cetirizine (ZYRTEC) 10 mg capsule, Take 10 mg by mouth once daily., Disp: , Rfl:  .  cholecalciferol (CHOLECALCIFEROL) 1,000 unit tablet, Take by mouth once daily., Disp: , Rfl:  .  cyanocobalamin (VITAMIN B12) 1000 MCG tablet, Take 1,000 mcg by mouth once daily., Disp: , Rfl:  .  etodolac (LODINE) 400 MG tablet, TAKE 1 TABLET BY MOUTH TWICE A DAY, Disp: 60 tablet, Rfl: 0 .  hydroCHLOROthiazide (HYDRODIURIL) 25 MG tablet, Take 1 tablet (25 mg total) by mouth once daily, Disp: 90 tablet, Rfl: 3 .  ibuprofen (ADVIL,MOTRIN) 600 MG tablet, take 1 tablet  by mouth every 6 hours if needed, Disp: 60 tablet, Rfl: 0 .  LUTEIN ORAL, Take 40 mg by mouth., Disp: , Rfl:  .  methocarbamoL (ROBAXIN) 500 MG tablet, Take 1 tablet (500 mg total) by mouth nightly, Disp: 30 tablet, Rfl: 0 .  PREMPRO 0.625-5 mg tablet, TAKE 1 TABLET BY MOUTH EVERY DAY, Disp: 84 tablet, Rfl: 2   Exam:   BP 122/80   Ht 152.4 cm (5')   Wt 54 kg (119 lb)   BMI 23.24 kg/m   General: Patient is well-groomed, well-nourished, appears stated age in no acute distress   HEENT: head is atraumatic and  normocephalic, trachea is midline, neck is supple with no palpable nodules   CV: Regular rhythm and normal heart rate, no murmur   Pulm: Clear to auscultation throughout lung fields with no wheezing, crackles, or rhonchi. No increased work of breathing  Abdomen: deferred  Impression:   The encounter diagnosis was Preoperative clearance.  Plan:   1. PMB, Uterine Fibroids  Patient returns for a preoperative discussion regarding her plans to proceed with surgical treatment of her postmenopausal bleeding and uterine fibroids by total laparoscopic hysterectomy with bilateral salpingo oophorectomy procedure. We may perform a cystoscopy to evaluate the urinary tract after the procedure, if surgically indicated for uro tract integrity.   The patient and I discussed the technical aspects of the procedure including the potential for risks and complications.  These include but are not limited to the risk of infection requiring post-operative antibiotics or further procedures.  We talked about the risk of injury to adjacent organs including bladder, bowel, ureter, blood vessels or nerves.  We talked about the need to convert to an open incision.  We talked about the possible need for blood transfusion.  We talked about postop complications such as thromboembolic or cardiopulmonary complications.  All of her questions were answered. Preoperative exam was completed. Surgical scheduler will call patient to schedule.  Specific Peri-operative Considerations:  - Consent: obtained today - Health Maintenance: None - Labs: CBC, CMP preoperatively - Studies: EKG, CXR preoperatively - Bowel Preparation: None required - Abx:  Ancef 2g - VTE ppx: SCDs perioperatively - Glucose Protocol: None - Beta-blockade: None   Diagnoses and all orders for this visit:  Preoperative clearance   No follow-ups on file.   ~~~~~~~~~~~~~~~~~~~~~~~~~~~~~~~~~~~~~~~~~~~~~~~~~~~~~~~~~~~~ This note is partially written by  Priscella Mann, in the presence of and acting as the scribe of Dr. Benjaman Kindler, who has reviewed, edited and added to the note to reflect her best personal medical judgment.  This note was generated in part with voice recognition software and I apologize for any typographical errors that were not detected and corrected.  Sherrie George, MD

## 2019-12-18 ENCOUNTER — Encounter
Admission: RE | Admit: 2019-12-18 | Discharge: 2019-12-18 | Disposition: A | Discharge status: Home / Self Care | Payer: PPO | Source: Ambulatory Visit | Attending: Obstetrics and Gynecology | Admitting: Obstetrics and Gynecology

## 2019-12-18 ENCOUNTER — Other Ambulatory Visit: Payer: Self-pay

## 2019-12-18 DIAGNOSIS — I10 Essential (primary) hypertension: Secondary | ICD-10-CM | POA: Insufficient documentation

## 2019-12-18 DIAGNOSIS — Z01818 Encounter for other preprocedural examination: Secondary | ICD-10-CM | POA: Diagnosis not present

## 2019-12-18 NOTE — Patient Instructions (Signed)
Your procedure is scheduled on: 12-28-19 [facility] Report to Same Day Surgery 2nd floor medical mall [facility] Entrance-take elevator on left to 2nd floor.  Check in with surgery information desk.) To find out your arrival time please call [phone] between 1PM - 3PM on 12-25-19 FRIDAY  Remember: Instructions that are not followed completely may result in serious medical risk, up to and including death, or upon the discretion of your surgeon and anesthesiologist your surgery may need to be rescheduled.    _x___ 1. Do not eat food after midnight the night before your procedure. NO GUM OR CANDY AFTER MIDNIGHT. You may drink clear liquids up to 2 hours before you are scheduled to arrive at the hospital for your procedure.  Do not drink clear liquids within 2 hours of your scheduled arrival to the hospital.  Clear liquids include  --Water or Apple juice without pulp  --Gatorade  --Black Coffee or Clear Tea (No milk, no creamers, do not add anything to the coffee or Tea-ok to add sugar)  _X___Ensure clear carbohydrate drink-FINISH DRINK 2 HOURS PRIOR TO ARRIVAL TIME TO HOSPITAL THE DAY OF YOUR SURGERY    __x__ 2. No Alcohol for 24 hours before or after surgery.   __x__3. No Smoking or e-cigarettes for 24 prior to surgery.  Do not use any chewable tobacco products for at least 6 hour prior to surgery   ____  4. Bring all medications with you on the day of surgery if instructed.    __x__ 5. Notify your doctor if there is any change in your medical condition     (cold, fever, infections).    x___6. On the morning of surgery brush your teeth with toothpaste and water.  You may rinse your mouth with mouth wash if you wish.  Do not swallow any toothpaste or mouthwash.   Do not wear jewelry, make-up, hairpins, clips or nail polish.  Do not wear lotions, powders, or perfumes.  Do not shave 48 hours prior to surgery. Men may shave face and neck.  Do not bring valuables to the hospital.    [facility] is not responsible for any belongings or valuables.               Contacts, dentures or bridgework may not be worn into surgery.  Leave your suitcase in the car. After surgery it may be brought to your room.  For patients admitted to the hospital, discharge time is determined by your treatment team.  _  Patients discharged the day of surgery will not be allowed to drive home.  You will need someone to drive you home and stay with you the night of your procedure.    Please read over the following fact sheets that you were given:   [facility] Preparing for Surgery  _x___ TAKE THE FOLLOWING MEDICATION THE MORNING OF SURGERY WITH A SMALL SIP OF WATER. These include:  1. WELLBUTRIN (BUPROPION)  2.  3.  4.  5.  6.  ____Fleets enema or Magnesium Citrate as directed.   _x___ Use CHG Soap or sage wipes as directed on instruction sheet   ____ Use inhalers on the day of surgery and bring to hospital day of surgery  ____ Stop Metformin and Janumet 2 days prior to surgery.    ____ Take 1/2 of usual insulin dose the night before surgery and none on the morning surgery.   ____ Follow recommendations from Cardiologist, Pulmonologist or PCP regarding stopping Aspirin, Coumadin, Plavix ,  Eliquis, Effient, or Pradaxa, and Pletal.  X____Stop Anti-inflammatories such as Advil, Aleve, Ibuprofen, Motrin, Naproxen, Naprosyn, Goodies powders or aspirin products 7 DAYS PRIOR TO SURGERY-OK to take Tylenol    _x___ Stop supplements until after surgery-STOP YOUR LUTEIN 7 DAYS PRIOR TO SURGERY-OK TO CONTINUE AFTER YOUR SURGERY   ____ Bring C-Pap to the hospital.

## 2019-12-21 ENCOUNTER — Encounter
Admission: RE | Admit: 2019-12-21 | Discharge: 2019-12-21 | Disposition: A | Discharge status: Home / Self Care | Payer: PPO | Source: Ambulatory Visit | Attending: Obstetrics and Gynecology | Admitting: Obstetrics and Gynecology

## 2019-12-21 ENCOUNTER — Other Ambulatory Visit: Payer: Self-pay

## 2019-12-21 DIAGNOSIS — Z01818 Encounter for other preprocedural examination: Secondary | ICD-10-CM | POA: Diagnosis not present

## 2019-12-21 DIAGNOSIS — I10 Essential (primary) hypertension: Secondary | ICD-10-CM | POA: Diagnosis not present

## 2019-12-21 LAB — TYPE AND SCREEN
ABO/RH(D): O POS
Antibody Screen: NEGATIVE

## 2019-12-21 LAB — CBC
HCT: 41.1 % (ref 36.0–46.0)
Hemoglobin: 14 g/dL (ref 12.0–15.0)
MCH: 31.1 pg (ref 26.0–34.0)
MCHC: 34.1 g/dL (ref 30.0–36.0)
MCV: 91.3 fL (ref 80.0–100.0)
Platelets: 270 10*3/uL (ref 150–400)
RBC: 4.5 MIL/uL (ref 3.87–5.11)
RDW: 12.7 % (ref 11.5–15.5)
WBC: 6.8 10*3/uL (ref 4.0–10.5)
nRBC: 0 % (ref 0.0–0.2)

## 2019-12-21 LAB — BASIC METABOLIC PANEL
Anion gap: 8 (ref 5–15)
BUN: 15 mg/dL (ref 8–23)
CO2: 25 mmol/L (ref 22–32)
Calcium: 9 mg/dL (ref 8.9–10.3)
Chloride: 103 mmol/L (ref 98–111)
Creatinine, Ser: 0.96 mg/dL (ref 0.44–1.00)
GFR calc Af Amer: >60 mL/min (ref >60)
GFR calc non Af Amer: 58 mL/min — ABNORMAL LOW (ref >60)
Glucose, Bld: 91 mg/dL (ref 70–99)
Potassium: 3 mmol/L — ABNORMAL LOW (ref 3.5–5.1)
Sodium: 136 mmol/L (ref 135–145)

## 2019-12-21 NOTE — Progress Notes (Signed)
°  [facility] Perioperative Services: Pre-Admission/Anesthesia Testing  Abnormal Lab Notification    Date: 12/21/19  Name: Patricia Miller MRN:   [id number]  Re: Abnormal labs noted during PAT appointment   Provider Notified: Benjaman Kindler, MD Notification mode: Routed via [facility]   ABNORMAL LAB VALUE(S):  K+ 3.0  NOTES: Patient on thiazide diuretic for HTN. Scheduled for hysterectomy on 12/28/2019. Lab forwarded to surgeon for optimization. Will plan on rechecking K+ on the morning of surgery.   Honor Loh, MSN, APRN, FNP-C, CEN [facility]  Peri-operative Services Nurse Practitioner Phone: [phone] 12/21/19 12:00 PM

## 2019-12-24 ENCOUNTER — Other Ambulatory Visit
Admission: RE | Admit: 2019-12-24 | Discharge: 2019-12-24 | Disposition: A | Discharge status: Home / Self Care | Payer: PPO | Source: Ambulatory Visit | Attending: Obstetrics and Gynecology | Admitting: Obstetrics and Gynecology

## 2019-12-24 ENCOUNTER — Other Ambulatory Visit: Payer: Self-pay

## 2019-12-24 DIAGNOSIS — Z20822 Contact with and (suspected) exposure to covid-19: Secondary | ICD-10-CM | POA: Diagnosis not present

## 2019-12-24 DIAGNOSIS — Z01812 Encounter for preprocedural laboratory examination: Secondary | ICD-10-CM | POA: Diagnosis not present

## 2019-12-24 LAB — SARS CORONAVIRUS 2 (TAT 6-24 HRS): SARS Coronavirus 2: NEGATIVE

## 2019-12-28 ENCOUNTER — Ambulatory Visit
Admission: RE | Admit: 2019-12-28 | Discharge: 2019-12-28 | Disposition: A | Discharge status: Home / Self Care | Payer: PPO | Attending: Obstetrics and Gynecology | Admitting: Obstetrics and Gynecology

## 2019-12-28 ENCOUNTER — Encounter: Payer: Self-pay | Admitting: Obstetrics and Gynecology

## 2019-12-28 ENCOUNTER — Ambulatory Visit: Payer: PPO | Admitting: Urgent Care

## 2019-12-28 ENCOUNTER — Encounter
Admission: RE | Disposition: A | Discharge status: Home / Self Care | Payer: Self-pay | Source: Home / Self Care | Attending: Obstetrics and Gynecology

## 2019-12-28 DIAGNOSIS — Z86018 Personal history of other benign neoplasm: Secondary | ICD-10-CM | POA: Insufficient documentation

## 2019-12-28 DIAGNOSIS — Z87891 Personal history of nicotine dependence: Secondary | ICD-10-CM | POA: Insufficient documentation

## 2019-12-28 DIAGNOSIS — M1990 Unspecified osteoarthritis, unspecified site: Secondary | ICD-10-CM | POA: Insufficient documentation

## 2019-12-28 DIAGNOSIS — I10 Essential (primary) hypertension: Secondary | ICD-10-CM | POA: Diagnosis not present

## 2019-12-28 DIAGNOSIS — F419 Anxiety disorder, unspecified: Secondary | ICD-10-CM | POA: Insufficient documentation

## 2019-12-28 DIAGNOSIS — D250 Submucous leiomyoma of uterus: Secondary | ICD-10-CM | POA: Diagnosis not present

## 2019-12-28 DIAGNOSIS — D259 Leiomyoma of uterus, unspecified: Secondary | ICD-10-CM | POA: Diagnosis not present

## 2019-12-28 DIAGNOSIS — N858 Other specified noninflammatory disorders of uterus: Secondary | ICD-10-CM | POA: Diagnosis not present

## 2019-12-28 DIAGNOSIS — D251 Intramural leiomyoma of uterus: Secondary | ICD-10-CM | POA: Insufficient documentation

## 2019-12-28 DIAGNOSIS — N950 Postmenopausal bleeding: Secondary | ICD-10-CM | POA: Diagnosis not present

## 2019-12-28 DIAGNOSIS — D279 Benign neoplasm of unspecified ovary: Secondary | ICD-10-CM | POA: Insufficient documentation

## 2019-12-28 DIAGNOSIS — Z79899 Other long term (current) drug therapy: Secondary | ICD-10-CM | POA: Diagnosis not present

## 2019-12-28 HISTORY — PX: TOTAL LAPAROSCOPIC HYSTERECTOMY WITH BILATERAL SALPINGO OOPHORECTOMY: SHX6845

## 2019-12-28 LAB — POCT I-STAT, CHEM 8
BUN: 11 mg/dL (ref 8–23)
Calcium, Ion: 1.09 mmol/L — ABNORMAL LOW (ref 1.15–1.40)
Chloride: 99 mmol/L (ref 98–111)
Creatinine, Ser: 0.9 mg/dL (ref 0.44–1.00)
Glucose, Bld: 95 mg/dL (ref 70–99)
HCT: 41 % (ref 36.0–46.0)
Hemoglobin: 13.9 g/dL (ref 12.0–15.0)
Potassium: 2.8 mmol/L — ABNORMAL LOW (ref 3.5–5.1)
Sodium: 141 mmol/L (ref 135–145)
TCO2: 24 mmol/L (ref 22–32)

## 2019-12-28 SURGERY — HYSTERECTOMY, TOTAL, LAPAROSCOPIC, WITH BILATERAL SALPINGO-OOPHORECTOMY
Anesthesia: General | Site: Abdomen | Laterality: Bilateral

## 2019-12-28 MED ORDER — POTASSIUM CHLORIDE 10 MEQ/100ML IV SOLN
INTRAVENOUS | Status: DC | PRN
  Administered 2019-12-28: 10 meq via INTRAVENOUS

## 2019-12-28 MED ORDER — ONDANSETRON HCL 4 MG/2ML IJ SOLN
INTRAMUSCULAR | Status: DC | PRN
  Administered 2019-12-28: 4 mg via INTRAVENOUS

## 2019-12-28 MED ORDER — PROPOFOL 500 MG/50ML IV EMUL
INTRAVENOUS | Status: AC
  Filled 2019-12-28: qty 50

## 2019-12-28 MED ORDER — CEFAZOLIN SODIUM-DEXTROSE 2-4 GM/100ML-% IV SOLN
2.0000 g | INTRAVENOUS | Status: AC
  Administered 2019-12-28: 2 g via INTRAVENOUS

## 2019-12-28 MED ORDER — FAMOTIDINE 20 MG PO TABS
20.0000 mg | ORAL_TABLET | Freq: Once | ORAL | Status: AC
  Administered 2019-12-28: 20 mg via ORAL

## 2019-12-28 MED ORDER — SODIUM CHLORIDE 0.9 % IV SOLN
INTRAVENOUS | Status: DC | PRN
  Administered 2019-12-28: 25 ug/min via INTRAVENOUS

## 2019-12-28 MED ORDER — ACETAMINOPHEN 500 MG PO TABS
1000.0000 mg | ORAL_TABLET | Freq: Four times a day (QID) | ORAL | 0 refills | Status: AC
Start: 2019-12-28 — End: 2019-12-31

## 2019-12-28 MED ORDER — FENTANYL CITRATE (PF) 100 MCG/2ML IJ SOLN
INTRAMUSCULAR | Status: DC | PRN
  Administered 2019-12-28: 50 ug via INTRAVENOUS
  Administered 2019-12-28: 100 ug via INTRAVENOUS
  Administered 2019-12-28 (×2): 50 ug via INTRAVENOUS

## 2019-12-28 MED ORDER — ROCURONIUM BROMIDE 100 MG/10ML IV SOLN
INTRAVENOUS | Status: DC | PRN
  Administered 2019-12-28: 60 mg via INTRAVENOUS

## 2019-12-28 MED ORDER — LABETALOL HCL 5 MG/ML IV SOLN
INTRAVENOUS | Status: DC | PRN
Start: 2019-12-28 — End: 2019-12-28
  Administered 2019-12-28: 5 mg via INTRAVENOUS

## 2019-12-28 MED ORDER — OXYCODONE HCL 5 MG/5ML PO SOLN: 5.0000 mg | Freq: Once | ORAL | Status: DC | PRN

## 2019-12-28 MED ORDER — FENTANYL CITRATE (PF) 100 MCG/2ML IJ SOLN
INTRAMUSCULAR | Status: AC
  Administered 2019-12-28: 50 ug via INTRAVENOUS
  Filled 2019-12-28: qty 2

## 2019-12-28 MED ORDER — ACETAMINOPHEN 10 MG/ML IV SOLN: 1000.0000 mg | Freq: Once | INTRAVENOUS | Status: DC | PRN

## 2019-12-28 MED ORDER — GABAPENTIN 600 MG PO TABS: 300.0000 mg | ORAL_TABLET | Freq: Every day | ORAL | 0 refills | Status: DC

## 2019-12-28 MED ORDER — PHENYLEPHRINE HCL (PRESSORS) 10 MG/ML IV SOLN
INTRAVENOUS | Status: AC
  Filled 2019-12-28: qty 1

## 2019-12-28 MED ORDER — FAMOTIDINE 20 MG PO TABS
ORAL_TABLET | ORAL | Status: AC
  Filled 2019-12-28: qty 1

## 2019-12-28 MED ORDER — BUPIVACAINE HCL (PF) 0.5 % IJ SOLN
INTRAMUSCULAR | Status: AC
  Filled 2019-12-28: qty 30

## 2019-12-28 MED ORDER — DEXAMETHASONE SODIUM PHOSPHATE 10 MG/ML IJ SOLN
INTRAMUSCULAR | Status: AC
  Filled 2019-12-28: qty 1

## 2019-12-28 MED ORDER — LIDOCAINE HCL (PF) 2 % IJ SOLN
INTRAMUSCULAR | Status: AC
  Filled 2019-12-28: qty 5

## 2019-12-28 MED ORDER — CHLORHEXIDINE GLUCONATE 0.12 % MT SOLN: 15.0000 mL | Freq: Once | OROMUCOSAL | Status: DC

## 2019-12-28 MED ORDER — POVIDONE-IODINE 10 % EX SWAB
2.0000 "application " | Freq: Once | CUTANEOUS | Status: AC
  Administered 2019-12-28: 2 via TOPICAL

## 2019-12-28 MED ORDER — FENTANYL CITRATE (PF) 250 MCG/5ML IJ SOLN
INTRAMUSCULAR | Status: AC
  Filled 2019-12-28: qty 5

## 2019-12-28 MED ORDER — ROCURONIUM BROMIDE 10 MG/ML (PF) SYRINGE
PREFILLED_SYRINGE | INTRAVENOUS | Status: AC
  Filled 2019-12-28: qty 10

## 2019-12-28 MED ORDER — ONDANSETRON HCL 4 MG/2ML IJ SOLN
INTRAMUSCULAR | Status: AC
  Filled 2019-12-28: qty 2

## 2019-12-28 MED ORDER — CHLORHEXIDINE GLUCONATE 0.12 % MT SOLN
OROMUCOSAL | Status: AC
  Filled 2019-12-28: qty 15

## 2019-12-28 MED ORDER — KETOROLAC TROMETHAMINE 30 MG/ML IJ SOLN
INTRAMUSCULAR | Status: DC | PRN
  Administered 2019-12-28: 15 mg via INTRAVENOUS

## 2019-12-28 MED ORDER — ACETAMINOPHEN 10 MG/ML IV SOLN
INTRAVENOUS | Status: AC
  Filled 2019-12-28: qty 100

## 2019-12-28 MED ORDER — DEXAMETHASONE SODIUM PHOSPHATE 10 MG/ML IJ SOLN
INTRAMUSCULAR | Status: DC | PRN
  Administered 2019-12-28: 5 mg via INTRAVENOUS

## 2019-12-28 MED ORDER — PROPOFOL 10 MG/ML IV BOLUS
INTRAVENOUS | Status: AC
  Filled 2019-12-28: qty 20

## 2019-12-28 MED ORDER — LIDOCAINE HCL (CARDIAC) PF 100 MG/5ML IV SOSY
PREFILLED_SYRINGE | INTRAVENOUS | Status: DC | PRN
  Administered 2019-12-28: 60 mg via INTRAVENOUS

## 2019-12-28 MED ORDER — DOCUSATE SODIUM 100 MG PO CAPS
100.0000 mg | ORAL_CAPSULE | Freq: Two times a day (BID) | ORAL | 0 refills | Status: DC
Start: 2019-12-28 — End: 2022-04-03

## 2019-12-28 MED ORDER — BUPIVACAINE HCL 0.5 % IJ SOLN
INTRAMUSCULAR | Status: DC | PRN
  Administered 2019-12-28: 8 mL

## 2019-12-28 MED ORDER — IBUPROFEN 800 MG PO TABS: 800.0000 mg | ORAL_TABLET | Freq: Three times a day (TID) | ORAL | 1 refills | Status: AC

## 2019-12-28 MED ORDER — PROPOFOL 500 MG/50ML IV EMUL
INTRAVENOUS | Status: DC | PRN
  Administered 2019-12-28: 100 ug/kg/min via INTRAVENOUS

## 2019-12-28 MED ORDER — SUGAMMADEX SODIUM 500 MG/5ML IV SOLN
INTRAVENOUS | Status: AC
  Filled 2019-12-28: qty 5

## 2019-12-28 MED ORDER — CEFAZOLIN SODIUM-DEXTROSE 2-4 GM/100ML-% IV SOLN
INTRAVENOUS | Status: AC
  Filled 2019-12-28: qty 100

## 2019-12-28 MED ORDER — OXYCODONE HCL 5 MG PO TABS: 5.0000 mg | ORAL_TABLET | ORAL | 0 refills | Status: DC | PRN

## 2019-12-28 MED ORDER — LACTATED RINGERS IV SOLN: INTRAVENOUS | Status: DC

## 2019-12-28 MED ORDER — FENTANYL CITRATE (PF) 100 MCG/2ML IJ SOLN
25.0000 ug | INTRAMUSCULAR | Status: DC | PRN
  Administered 2019-12-28: 50 ug via INTRAVENOUS

## 2019-12-28 MED ORDER — LABETALOL HCL 5 MG/ML IV SOLN
INTRAVENOUS | Status: AC
  Filled 2019-12-28: qty 4

## 2019-12-28 MED ORDER — ONDANSETRON HCL 4 MG/2ML IJ SOLN: 4.0000 mg | Freq: Once | INTRAMUSCULAR | Status: DC | PRN

## 2019-12-28 MED ORDER — OXYCODONE HCL 5 MG PO TABS: 5.0000 mg | ORAL_TABLET | Freq: Once | ORAL | Status: DC | PRN

## 2019-12-28 MED ORDER — PROPOFOL 10 MG/ML IV BOLUS
INTRAVENOUS | Status: DC | PRN
  Administered 2019-12-28: 100 mg via INTRAVENOUS

## 2019-12-28 MED ORDER — LACTATED RINGERS IV SOLN: INTRAVENOUS | Status: DC | PRN

## 2019-12-28 MED ORDER — ORAL CARE MOUTH RINSE: 15.0000 mL | Freq: Once | OROMUCOSAL | Status: DC

## 2019-12-28 MED ORDER — SUGAMMADEX SODIUM 500 MG/5ML IV SOLN
INTRAVENOUS | Status: DC | PRN
  Administered 2019-12-28: 300 mg via INTRAVENOUS

## 2019-12-28 MED ORDER — POTASSIUM CHLORIDE 10 MEQ/100ML IV SOLN
10.0000 meq | INTRAVENOUS | Status: AC
  Filled 2019-12-28 (×3): qty 100

## 2019-12-28 MED ORDER — ACETAMINOPHEN 10 MG/ML IV SOLN
INTRAVENOUS | Status: DC | PRN
  Administered 2019-12-28: 1000 mg via INTRAVENOUS

## 2019-12-28 SURGICAL SUPPLY — 59 items
BAG URINE DRAIN 2000ML AR STRL (UROLOGICAL SUPPLIES) ×3 IMPLANT
BLADE SURG SZ11 CARB STEEL (BLADE) ×6 IMPLANT
CATH FOLEY 2WAY  5CC 16FR (CATHETERS) ×2
CATH URTH 16FR FL 2W BLN LF (CATHETERS) ×1 IMPLANT
CHLORAPREP W/TINT 26 (MISCELLANEOUS) ×3 IMPLANT
CORD MONOPOLAR M/FML 12FT (MISCELLANEOUS) ×3 IMPLANT
COUNTER NEEDLE 20/40 LG (NEEDLE) ×3 IMPLANT
COVER LIGHT HANDLE STERIS (MISCELLANEOUS) ×15 IMPLANT
COVER WAND RF STERILE (DRAPES) ×3 IMPLANT
DERMABOND ADVANCED (GAUZE/BANDAGES/DRESSINGS) ×4
DERMABOND ADVANCED .7 DNX12 (GAUZE/BANDAGES/DRESSINGS) ×2 IMPLANT
DEVICE SUTURE ENDOST 10MM (ENDOMECHANICALS) IMPLANT
DRAPE GENERAL ENDO 106X123.5 (DRAPES) ×3 IMPLANT
DRSG TEGADERM 2-3/8X2-3/4 SM (GAUZE/BANDAGES/DRESSINGS) ×9 IMPLANT
GAUZE 4X4 16PLY RFD (DISPOSABLE) ×3 IMPLANT
GLOVE BIO SURGEON STRL SZ7 (GLOVE) ×12 IMPLANT
GLOVE BIOGEL PI IND STRL 7.5 (GLOVE) ×1 IMPLANT
GLOVE BIOGEL PI INDICATOR 7.5 (GLOVE) ×2
GLOVE INDICATOR 7.5 STRL GRN (GLOVE) ×6 IMPLANT
GOWN STRL REUS W/ TWL LRG LVL3 (GOWN DISPOSABLE) ×3 IMPLANT
GOWN STRL REUS W/ TWL XL LVL3 (GOWN DISPOSABLE) ×1 IMPLANT
GOWN STRL REUS W/TWL LRG LVL3 (GOWN DISPOSABLE) ×6
GOWN STRL REUS W/TWL XL LVL3 (GOWN DISPOSABLE) ×2
GRASPER SUT TROCAR 14GX15 (MISCELLANEOUS) ×3 IMPLANT
IRRIGATION STRYKERFLOW (MISCELLANEOUS) ×1 IMPLANT
IRRIGATOR STRYKERFLOW (MISCELLANEOUS) ×3
KIT PINK PAD W/HEAD ARE REST (MISCELLANEOUS) ×3
KIT PINK PAD W/HEAD ARM REST (MISCELLANEOUS) ×1 IMPLANT
KIT TURNOVER CYSTO (KITS) ×3 IMPLANT
L-HOOK LAP DISP 36CM (ELECTROSURGICAL) ×3
LABEL OR SOLS (LABEL) ×3 IMPLANT
LHOOK LAP DISP 36CM (ELECTROSURGICAL) ×1 IMPLANT
LIGASURE VESSEL 5MM BLUNT TIP (ELECTROSURGICAL) ×3 IMPLANT
MANIPULATOR VCARE LG CRV RETR (MISCELLANEOUS) IMPLANT
MANIPULATOR VCARE SML CRV RETR (MISCELLANEOUS) IMPLANT
MANIPULATOR VCARE STD CRV RETR (MISCELLANEOUS) ×3 IMPLANT
NS IRRIG 500ML POUR BTL (IV SOLUTION) ×3 IMPLANT
OCCLUDER COLPOPNEUMO (BALLOONS) ×3 IMPLANT
PACK GYN LAPAROSCOPIC (MISCELLANEOUS) ×3 IMPLANT
PAD OB MATERNITY 4.3X12.25 (PERSONAL CARE ITEMS) ×3 IMPLANT
PAD PREP 24X41 OB/GYN DISP (PERSONAL CARE ITEMS) ×3 IMPLANT
PENCIL ELECTRO HAND CTR (MISCELLANEOUS) ×3 IMPLANT
SCISSORS METZENBAUM CVD 33 (INSTRUMENTS) ×3 IMPLANT
SET CYSTO W/LG BORE CLAMP LF (SET/KITS/TRAYS/PACK) ×3 IMPLANT
SLEEVE ENDOPATH XCEL 5M (ENDOMECHANICALS) ×9 IMPLANT
SPONGE GAUZE 2X2 8PLY STER LF (GAUZE/BANDAGES/DRESSINGS) ×2
SPONGE GAUZE 2X2 8PLY STRL LF (GAUZE/BANDAGES/DRESSINGS) ×4 IMPLANT
SUT ENDO VLOC 180-0-8IN (SUTURE) IMPLANT
SUT MNCRL 4-0 (SUTURE) ×2
SUT MNCRL 4-0 27XMFL (SUTURE) ×1
SUT VIC AB 0 CT1 27 (SUTURE) ×2
SUT VIC AB 0 CT1 27XCR 8 STRN (SUTURE) ×1 IMPLANT
SUT VIC AB 0 CT1 36 (SUTURE) ×9 IMPLANT
SUTURE MNCRL 4-0 27XMF (SUTURE) ×1 IMPLANT
SYR 10ML LL (SYRINGE) ×3 IMPLANT
SYR 50ML LL SCALE MARK (SYRINGE) ×3 IMPLANT
TROCAR ENDO BLADELESS 11MM (ENDOMECHANICALS) ×3 IMPLANT
TROCAR XCEL NON-BLD 5MMX100MML (ENDOMECHANICALS) ×3 IMPLANT
TUBING EVAC SMOKE HEATED PNEUM (TUBING) ×3 IMPLANT

## 2019-12-28 NOTE — Anesthesia Preprocedure Evaluation (Signed)
Anesthesia Evaluation  Patient identified by MRN, date of birth, ID band Patient awake    Reviewed: Allergy & Precautions, NPO status , Patient's Chart, lab work & pertinent test results  History of Anesthesia Complications Negative for: history of anesthetic complications  Airway Mallampati: II  TM Distance: >3 FB Neck ROM: Full    Dental no notable dental hx. (+) Dental Advisory Given, Teeth Intact   Pulmonary neg sleep apnea, neg COPD, former smoker,    breath sounds clear to auscultation- rhonchi (-) wheezing      Cardiovascular Exercise Tolerance: Good hypertension, Pt. on medications (-) CAD, (-) Past MI, (-) Cardiac Stents and (-) CABG  Rhythm:Regular Rate:Normal - Systolic murmurs and - Diastolic murmurs    Neuro/Psych Anxiety negative neurological ROS     GI/Hepatic negative GI ROS, Neg liver ROS,   Endo/Other  negative endocrine ROSneg diabetes  Renal/GU negative Renal ROS     Musculoskeletal  (+) Arthritis ,   Abdominal (+) - obese,   Peds  Hematology negative hematology ROS (+)   Anesthesia Other Findings Past Medical History: No date: Anxiety No date: Arthritis     Comment:  NECK No date: Cancer ([place])     Comment:  BASAL CELL AND MELANOMA No date: Hypertension   Reproductive/Obstetrics                             Anesthesia Physical  Anesthesia Plan  ASA: II  Anesthesia Plan: General   Post-op Pain Management:    Induction: Intravenous  PONV Risk Score and Plan: 3 and Ondansetron, Dexamethasone, TIVA, Propofol infusion and Treatment may vary due to age or medical condition  Airway Management Planned: Oral ETT  Additional Equipment: None  Intra-op Plan:   Post-operative Plan: Extubation in OR  Informed Consent: I have reviewed the patients History and Physical, chart, labs and discussed the procedure including the risks, benefits and alternatives for the  proposed anesthesia with the patient or authorized representative who has indicated his/her understanding and acceptance.     Dental advisory given  Plan Discussed with: CRNA and Anesthesiologist  Anesthesia Plan Comments: (Discussed risks of anesthesia with patient, including PONV, sore throat, lip/dental damage. Rare risks discussed as well, such as cardiorespiratory and neurological sequelae. Patient understands.  Potassium 2.8 today; patient on HCTZ, seen PCP last week, no new dose changes. No cardiac or rhythm abnormalities, will proceed and supplement intraop.)        Anesthesia Quick Evaluation

## 2019-12-28 NOTE — Discharge Instructions (Signed)
Discharge instructions after   total laparoscopic hysterectomy   For the next three days, take ibuprofen and acetaminophen on a schedule, every 8 hours. You can take them together or you can intersperse them, and take one every four hours. I also gave you gabapentin for nighttime, to help you sleep and also to control pain. Take these medicines at night for at least the next 3 nights. You also have a narcotic, oxycodone, to take as needed if the above medicines don't help.  Postop constipation is a major cause of pain. Stay well hydrated, walk as you tolerate, and take over the counter senna as well as stool softeners if you need them.    Signs and Symptoms to Report Call our office at [phone] if you have any of the following.  . Fever over 100.4 degrees or higher . Severe stomach pain not relieved with pain medications . Bright red bleeding that's heavier than a period that does not slow with rest . To go the bathroom a lot (frequency), you can't hold your urine (urgency), or it hurts when you empty your bladder (urinate) . Chest pain . Shortness of breath . Pain in the calves of your legs . Severe nausea and vomiting not relieved with anti-nausea medications . Signs of infection around your wounds, such as redness, hot to touch, swelling, green/yellow drainage (like pus), bad smelling discharge . Any concerns  What You Can Expect after Surgery . You may see some pink tinged, bloody fluid and bruising around the wound. This is normal. . You may notice shoulder and neck pain. This is caused by the gas used during surgery to expand your abdomen so your surgeon could get to the uterus easier. . You may have a sore throat because of the tube in your mouth during general anesthesia. This will go away in 2 to 3 days. . You may have some stomach cramps. . You may notice spotting on your panties. . You may have pain around the incision sites.   Activities after Your Discharge Follow  these guidelines to help speed your recovery at home: . Do the coughing and deep breathing as you did in the hospital for 2 weeks. Use the small blue breathing device, called the incentive spirometer for 2 weeks. . Don't drive if you are in pain or taking narcotic pain medicine. You may drive when you can safely slam on the brakes, turn the wheel forcefully, and rotate your torso comfortably. This is typically 1-2 weeks. Practice in a parking lot or side street prior to attempting to drive regularly.  . Ask others to help with household chores for 4 weeks. . Do not lift anything heavier that 10 pounds for 4-6 weeks. This includes pets, children, and groceries. . Don't do strenuous activities, exercises, or sports like vacuuming, tennis, squash, etc. until your doctor says it is safe to do so. ---Maintain pelvic rest for 8 weeks. This means nothing in the vagina or rectum at all (no douching, tampons, intercourse) for 8 weeks.  . Walk as you feel able. Rest often since it may take two or three weeks for your energy level to return to normal.  . You may climb stairs . Avoid constipation:   -Eat fruits, vegetables, and whole grains. Eat small meals as your appetite will take time to return to normal.   -Drink 6 to 8 glasses of water each day unless your doctor has told you to limit your fluids.   -Use a laxative  or stool softener as needed if constipation becomes a problem. You may take Miralax, metamucil, Citrucil, Colace, Senekot, FiberCon, etc. If this does not relieve the constipation, try two tablespoons of Milk Of Magnesia every 8 hours until your bowels move.  . You may shower. Gently wash the wounds with a mild soap and water. Pat dry. . Do not get in a hot tub, swimming pool, etc. for 6 weeks. . Do not use lotions, oils, powders on the wounds. . Do not douche, use tampons, or have sex until your doctor says it is okay. . Take your pain medicine when you need it. The medicine may not work as  well if the pain is bad.  Take the medicines you were taking before surgery. Other medications you will need are pain medications and possibly constipation and nausea medications (Zofran).

## 2019-12-28 NOTE — Transfer of Care (Signed)
Immediate Anesthesia Transfer of Care Note  Patient: [name]  Procedure(s) Performed: TOTAL LAPAROSCOPIC HYSTERECTOMY WITH BILATERAL SALPINGO OOPHORECTOMY (Bilateral Abdomen)  Patient Location: PACU  Anesthesia Type:General  Level of Consciousness: awake  Airway & Oxygen Therapy: Patient Spontanous Breathing  Post-op Assessment: Report given to RN and Post -op Vital signs reviewed and stable  Post vital signs: Reviewed and stable  Last Vitals:  Vitals Value Taken Time  BP 142/77 12/28/19 1028  Temp 36.1 C 12/28/19 1028  Pulse 71 12/28/19 1037  Resp 7 12/28/19 1037  SpO2 94 % 12/28/19 1037  Vitals shown include unvalidated device data.  Last Pain:  Vitals:   12/28/19 1028  TempSrc:   PainSc: 8          Complications: No complications documented.

## 2019-12-28 NOTE — Interval H&P Note (Signed)
History and Physical Interval Note:  12/28/2019 7:36 AM  Patricia Miller  has presented today for surgery, with the diagnosis of Fibroids, Postmenopausal Bleeding.  The various methods of treatment have been discussed with the patient and family. After consideration of risks, benefits and other options for treatment, the patient has consented to  Procedure(s): TOTAL LAPAROSCOPIC HYSTERECTOMY WITH BILATERAL SALPINGO OOPHORECTOMY (Bilateral) as a surgical intervention. Her K+ is low and IV supplementation during the procedure with anesthesia is planned. The patient's history has been reviewed, patient examined, no change in status, stable for surgery.  I have reviewed the patient's chart and labs.  Questions were answered to the patient's satisfaction.     Benjaman Kindler

## 2019-12-28 NOTE — Anesthesia Postprocedure Evaluation (Signed)
Anesthesia Post Note  Patient: Patricia Miller  Procedure(s) Performed: TOTAL LAPAROSCOPIC HYSTERECTOMY WITH BILATERAL SALPINGO OOPHORECTOMY (Bilateral Abdomen)  Patient location during evaluation: PACU Anesthesia Type: General Level of consciousness: awake and alert Pain management: pain level controlled Vital Signs Assessment: post-procedure vital signs reviewed and stable Respiratory status: spontaneous breathing, nonlabored ventilation, respiratory function stable and patient connected to nasal cannula oxygen Cardiovascular status: blood pressure returned to baseline and stable Postop Assessment: no apparent nausea or vomiting Anesthetic complications: no   No complications documented.   Last Vitals:  Vitals:   12/28/19 1139 12/28/19 1214  BP: 121/84 (!) 147/77  Pulse: 80 81  Resp: 16 16  Temp: (!) 36.1 C   SpO2: 100% 100%    Last Pain:  Vitals:   12/28/19 1214  TempSrc:   PainSc: 3                  Arita Miss

## 2019-12-28 NOTE — Anesthesia Procedure Notes (Signed)
Procedure Name: Intubation Date/Time: 12/28/2019 8:15 AM Performed by: Arita Miss, MD Pre-anesthesia Checklist: Patient identified, Patient being monitored, Timeout performed, Emergency Drugs available and Suction available Patient Re-evaluated:Patient Re-evaluated prior to induction Oxygen Delivery Method: Circle system utilized Preoxygenation: Pre-oxygenation with 100% oxygen Induction Type: IV induction Ventilation: Mask ventilation without difficulty Laryngoscope Size: Miller and 2 Grade View: Grade I Tube type: Oral Tube size: 6.5 mm Number of attempts: 1 Placement Confirmation: ETT inserted through vocal cords under direct vision,  positive ETCO2 and breath sounds checked- equal and bilateral Secured at: 22 cm Tube secured with: Tape Dental Injury: Teeth and Oropharynx as per pre-operative assessment

## 2019-12-28 NOTE — Op Note (Signed)
Patricia Miller PROCEDURE DATE: 12/28/2019  PREOPERATIVE DIAGNOSIS: Persistent postmenopausal bleeding; uterine fibroids, including submucosal  POSTOPERATIVE DIAGNOSIS: The same PROCEDURE:  TOTAL LAPAROSCOPIC HYSTERECTOMY WITH BILATERAL SALPINGO OOPHORECTOMY:   SURGEON:  Dr. Benjaman Kindler, MD ASSISTANT: Dr. Laverta Baltimore, MD  Anesthesiologist:  Anesthesiologist: Arita Miss, MD CRNA: Jerrye Noble, CRNA  INDICATIONS: 74 y.o. postmenopausal female here for definitive surgical management secondary to the indications listed under preoperative diagnoses; please see preoperative note for further details.  Risks of surgery were discussed with the patient including but not limited to: bleeding which may require transfusion or reoperation; infection which may require antibiotics; injury to bowel, bladder, ureters or other surrounding organs; need for additional procedures; thromboembolic phenomenon, incisional problems and other postoperative/anesthesia complications. Written informed consent was obtained.    FINDINGS:  Enlarged irregular uterus with multiple large submocosal fibroids. 5-6cm submucosal fibroid. Normal cervix. Interrupted bilateral fallopian tubes. Normal small bilateral ovaries. Normal appendix. Normal upper abdomen. Intact bladder and ureters bilaterally. Notable bleeding from right cervical angle that was repaired from below with 0-Vicryl and from above with Endostitch.   ANESTHESIA:    General INTRAVENOUS FLUIDS:1400  ml ESTIMATED BLOOD LOSS:50 ml URINE OUTPUT: 300 ml   SPECIMENS: Uterus, cervix, bilateral fallopian tubes and bilateral ovaries COMPLICATIONS: None immediate  PROCEDURE IN DETAIL:  The patient received prophalactic intravenous antibiotics and had sequential compression devices applied to her lower extremities while in the preoperative area.  She was then taken to the operating room where general anesthesia was administered and was found to be  adequate.  She was placed in the dorsal lithotomy position, and was prepped and draped in a sterile manner.  A formal time out was performed with all team members present and in agreement.  A V-care uterine manipulator was placed at this time.  A Foley catheter was inserted into her bladder and attached to constant drainage. Attention was turned to the abdomen where an umbilical incision was made with the scalpel.  The Optiview 5-mm trocar and sleeve were then advanced without difficulty with the laparoscope under direct visualization into the abdomen.  The abdomen was then insufflated with carbon dioxide gas and adequate pneumoperitoneum was obtained.  A survey of the patient's pelvis and abdomen revealed the findings above.  Bilateral lower quadrant ports (5 mm on the right and 5 mm on the left) were then placed under direct visualization.  The pelvis was then carefully examined.  Attention was turned to the IP ligaments. These were fulgurated and ligated, freeing the ovaries from the pelvic sidewall. The broad ligament was transected and the anterior and posterior leaflets of the broad opened. The uterine artery was then skeletonized and a bladder flap was created.  The ureters were noted to be safely away from the area of dissection.  The bladder was then bluntly dissected off the lower uterine segment.    At this point, attention was turned to the uterine vessels, which were clamped and ligated using the Ligasure.  Good hemostasis was noted overall.  The uterosacral and cardinal ligaments were clamped, cut and ligated bilaterally .  Attention was then turned to the cervicovaginal junction, and the bipolar scissors were used to transect the cervix from the surrounding vagina using the ring of the V-care as a guide. This was done circumferentially allowing total hysterectomy.  The uterus was manually cored out to prevent dropping pieces into the peritoneal cavity, and then removed from the vagina and the  vaginal cuff incision was then closed with  running 0 vicryl.  Bleeding above continued at the right angle, and severe figure of eights placed vaginally for control. From above, the left 14mm port was replaced with an 60mm trocar, and Endostitch used to control bleeding in a running vertical layer in addition. At the close of these procedures, excellent hemostasis was noted.    Attention was returned to the abdomen.The ureters were reexamined bilaterally and were pulsating normally. The abdominal pressure was reduced and hemostasis was confirmed.    Cystoscopy showed bilateral ureteral jets and intact bladder mucosa throughout.  No stitches were visualized in the bladder during cystoscopy.  The 74mm port fascia was closed with a vertical mattress with 0-Vicryl, using the cone closure system. All trocars were removed under direct visualization, and the abdomen was desufflated.  The fascial incision of the umbilicus was closed with a 0 Vicryl figure of eight stitch.  All skin incisions were closed with 4-0 Vicryl subcuticular stitches and Dermabond. The patient tolerated the procedures well.  All instruments, needles, and sponge counts were correct x 2. The patient was taken to the recovery room awake, extubated and in stable condition.

## 2019-12-29 ENCOUNTER — Encounter: Payer: Self-pay | Admitting: Obstetrics and Gynecology

## 2019-12-30 LAB — SURGICAL PATHOLOGY

## 2020-02-28 ENCOUNTER — Encounter: Payer: Self-pay | Admitting: Emergency Medicine

## 2020-02-28 ENCOUNTER — Inpatient Hospital Stay
Admission: EM | Admit: 2020-02-28 | Discharge: 2020-03-05 | DRG: 163 | Disposition: A | Discharge status: Home / Self Care | Payer: PPO | Attending: Internal Medicine | Admitting: Internal Medicine

## 2020-02-28 ENCOUNTER — Other Ambulatory Visit: Payer: Self-pay

## 2020-02-28 ENCOUNTER — Emergency Department: Payer: PPO

## 2020-02-28 DIAGNOSIS — I82412 Acute embolism and thrombosis of left femoral vein: Secondary | ICD-10-CM | POA: Diagnosis present

## 2020-02-28 DIAGNOSIS — Z8589 Personal history of malignant neoplasm of other organs and systems: Secondary | ICD-10-CM

## 2020-02-28 DIAGNOSIS — I2699 Other pulmonary embolism without acute cor pulmonale: Secondary | ICD-10-CM | POA: Diagnosis not present

## 2020-02-28 DIAGNOSIS — Z20822 Contact with and (suspected) exposure to covid-19: Secondary | ICD-10-CM | POA: Diagnosis present

## 2020-02-28 DIAGNOSIS — I82890 Acute embolism and thrombosis of other specified veins: Secondary | ICD-10-CM | POA: Diagnosis not present

## 2020-02-28 DIAGNOSIS — R413 Other amnesia: Secondary | ICD-10-CM | POA: Diagnosis not present

## 2020-02-28 DIAGNOSIS — F419 Anxiety disorder, unspecified: Secondary | ICD-10-CM | POA: Diagnosis present

## 2020-02-28 DIAGNOSIS — Z23 Encounter for immunization: Secondary | ICD-10-CM | POA: Diagnosis present

## 2020-02-28 DIAGNOSIS — I2609 Other pulmonary embolism with acute cor pulmonale: Secondary | ICD-10-CM | POA: Diagnosis present

## 2020-02-28 DIAGNOSIS — A419 Sepsis, unspecified organism: Secondary | ICD-10-CM | POA: Diagnosis not present

## 2020-02-28 DIAGNOSIS — Z85820 Personal history of malignant melanoma of skin: Secondary | ICD-10-CM

## 2020-02-28 DIAGNOSIS — E876 Hypokalemia: Secondary | ICD-10-CM

## 2020-02-28 DIAGNOSIS — R911 Solitary pulmonary nodule: Secondary | ICD-10-CM | POA: Diagnosis present

## 2020-02-28 DIAGNOSIS — Z90722 Acquired absence of ovaries, bilateral: Secondary | ICD-10-CM

## 2020-02-28 DIAGNOSIS — K529 Noninfective gastroenteritis and colitis, unspecified: Secondary | ICD-10-CM | POA: Diagnosis present

## 2020-02-28 DIAGNOSIS — N390 Urinary tract infection, site not specified: Secondary | ICD-10-CM | POA: Diagnosis present

## 2020-02-28 DIAGNOSIS — D62 Acute posthemorrhagic anemia: Secondary | ICD-10-CM | POA: Diagnosis not present

## 2020-02-28 DIAGNOSIS — J189 Pneumonia, unspecified organism: Secondary | ICD-10-CM

## 2020-02-28 DIAGNOSIS — J181 Lobar pneumonia, unspecified organism: Secondary | ICD-10-CM | POA: Diagnosis present

## 2020-02-28 DIAGNOSIS — Z9079 Acquired absence of other genital organ(s): Secondary | ICD-10-CM | POA: Diagnosis not present

## 2020-02-28 DIAGNOSIS — Z87891 Personal history of nicotine dependence: Secondary | ICD-10-CM

## 2020-02-28 DIAGNOSIS — I10 Essential (primary) hypertension: Secondary | ICD-10-CM

## 2020-02-28 DIAGNOSIS — I119 Hypertensive heart disease without heart failure: Secondary | ICD-10-CM | POA: Diagnosis present

## 2020-02-28 DIAGNOSIS — I82493 Acute embolism and thrombosis of other specified deep vein of lower extremity, bilateral: Secondary | ICD-10-CM | POA: Diagnosis not present

## 2020-02-28 DIAGNOSIS — Z79899 Other long term (current) drug therapy: Secondary | ICD-10-CM

## 2020-02-28 DIAGNOSIS — K5730 Diverticulosis of large intestine without perforation or abscess without bleeding: Secondary | ICD-10-CM | POA: Diagnosis present

## 2020-02-28 DIAGNOSIS — Z79890 Hormone replacement therapy (postmenopausal): Secondary | ICD-10-CM | POA: Diagnosis not present

## 2020-02-28 DIAGNOSIS — D369 Benign neoplasm, unspecified site: Secondary | ICD-10-CM | POA: Diagnosis present

## 2020-02-28 DIAGNOSIS — I82402 Acute embolism and thrombosis of unspecified deep veins of left lower extremity: Secondary | ICD-10-CM | POA: Diagnosis not present

## 2020-02-28 DIAGNOSIS — I82432 Acute embolism and thrombosis of left popliteal vein: Secondary | ICD-10-CM | POA: Diagnosis not present

## 2020-02-28 DIAGNOSIS — Z90710 Acquired absence of both cervix and uterus: Secondary | ICD-10-CM | POA: Diagnosis not present

## 2020-02-28 LAB — COMPREHENSIVE METABOLIC PANEL
ALT: 9 U/L (ref 0–44)
AST: 15 U/L (ref 15–41)
Albumin: 3.3 g/dL — ABNORMAL LOW (ref 3.5–5.0)
Alkaline Phosphatase: 67 U/L (ref 38–126)
Anion gap: 17 — ABNORMAL HIGH (ref 5–15)
BUN: 14 mg/dL (ref 8–23)
CO2: 23 mmol/L (ref 22–32)
Calcium: 8.9 mg/dL (ref 8.9–10.3)
Chloride: 92 mmol/L — ABNORMAL LOW (ref 98–111)
Creatinine, Ser: 0.92 mg/dL (ref 0.44–1.00)
GFR calc Af Amer: >60 mL/min (ref >60)
GFR calc non Af Amer: >60 mL/min (ref >60)
Glucose, Bld: 96 mg/dL (ref 70–99)
Potassium: 3.3 mmol/L — ABNORMAL LOW (ref 3.5–5.1)
Sodium: 132 mmol/L — ABNORMAL LOW (ref 135–145)
Total Bilirubin: 1.3 mg/dL — ABNORMAL HIGH (ref 0.3–1.2)
Total Protein: 7.8 g/dL (ref 6.5–8.1)

## 2020-02-28 LAB — CBC
HCT: 38.5 % (ref 36.0–46.0)
Hemoglobin: 12.9 g/dL (ref 12.0–15.0)
MCH: 29.9 pg (ref 26.0–34.0)
MCHC: 33.5 g/dL (ref 30.0–36.0)
MCV: 89.3 fL (ref 80.0–100.0)
Platelets: 342 10*3/uL (ref 150–400)
RBC: 4.31 MIL/uL (ref 3.87–5.11)
RDW: 12.2 % (ref 11.5–15.5)
WBC: 12.1 10*3/uL — ABNORMAL HIGH (ref 4.0–10.5)
nRBC: 0 % (ref 0.0–0.2)

## 2020-02-28 LAB — LIPASE, BLOOD: Lipase: 23 U/L (ref 11–51)

## 2020-02-28 MED ORDER — FENTANYL CITRATE (PF) 100 MCG/2ML IJ SOLN
50.0000 ug | INTRAMUSCULAR | Status: DC | PRN
  Administered 2020-02-28: 50 ug via INTRAVENOUS
  Filled 2020-02-28: qty 2

## 2020-02-28 MED ORDER — SODIUM CHLORIDE 0.9 % IV SOLN
500.0000 mg | INTRAVENOUS | Status: DC
  Administered 2020-02-29 – 2020-03-01 (×3): 500 mg via INTRAVENOUS
  Filled 2020-02-28 (×5): qty 500

## 2020-02-28 MED ORDER — SODIUM CHLORIDE 0.9 % IV SOLN
2.0000 g | INTRAVENOUS | Status: DC
  Administered 2020-02-29: 2 g via INTRAVENOUS
  Filled 2020-02-28 (×2): qty 20

## 2020-02-28 MED ORDER — ONDANSETRON HCL 4 MG/2ML IJ SOLN
4.0000 mg | INTRAMUSCULAR | Status: AC
  Administered 2020-02-29: 4 mg via INTRAVENOUS
  Filled 2020-02-28: qty 2

## 2020-02-28 MED ORDER — IOHEXOL 300 MG/ML  SOLN
75.0000 mL | Freq: Once | INTRAMUSCULAR | Status: AC | PRN
  Administered 2020-02-28: 75 mL via INTRAVENOUS

## 2020-02-28 MED ORDER — LACTATED RINGERS IV BOLUS (SEPSIS)
500.0000 mL | Freq: Once | INTRAVENOUS | Status: AC
  Administered 2020-02-29: 500 mL via INTRAVENOUS

## 2020-02-28 MED ORDER — LACTATED RINGERS IV SOLN: INTRAVENOUS | Status: DC

## 2020-02-28 MED ORDER — HEPARIN (PORCINE) 25000 UT/250ML-% IV SOLN
1100.0000 [IU]/h | INTRAVENOUS | Status: DC
  Administered 2020-02-29: 650 [IU]/h via INTRAVENOUS
  Administered 2020-03-01 – 2020-03-02 (×2): 1000 [IU]/h via INTRAVENOUS
  Filled 2020-02-28 (×3): qty 250

## 2020-02-28 MED ORDER — MORPHINE SULFATE (PF) 4 MG/ML IV SOLN
4.0000 mg | Freq: Once | INTRAVENOUS | Status: AC
  Administered 2020-02-29: 4 mg via INTRAVENOUS
  Filled 2020-02-28: qty 1

## 2020-02-28 MED ORDER — HEPARIN BOLUS VIA INFUSION
2700.0000 [IU] | Freq: Once | INTRAVENOUS | Status: AC
  Administered 2020-02-29: 2700 [IU] via INTRAVENOUS
  Filled 2020-02-28: qty 2700

## 2020-02-28 NOTE — ED Provider Notes (Signed)
[facility] Emergency Department Provider Note  ____________________________________________   First MD Initiated Contact with Patient 02/28/20 2311     (approximate)  I have reviewed the triage vital signs and the nursing notes.   HISTORY  Chief Complaint Abdominal Pain    HPI Patricia Miller is a 74 y.o. female  with medical history as listed below who presents for evaluation of several days of gradually worsening but now severe dull and aching right sided abdominal pain.  It seems to be in both the upper and lower right part of her abdomen. No history of trauma.  No history of cholecystectomy or appendectomy.  She denies nausea, vomiting, fever/chills, sore throat.  She says that she is having some shortness of breath but she is not sure if that is because of the pain.  Her pain is worse when she takes deep breaths (which also makes the pain feel sharp ) and she feels more short of breath when she is up and moving around.  She denies chest pain.  She is fully vaccinated for COVID-19.  At this point her symptoms have become severe.        Past Medical History:  Diagnosis Date  . Anxiety   . Arthritis    NECK  . 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 DAVF (dural arteriovenous fistula) 2015  . Hypertension     Patient Active Problem List   Diagnosis Date Noted  . Acute pulmonary embolism ([place]) 02/29/2020  . Acute deep vein thrombosis (DVT) of left femoral vein ([place]) 02/29/2020  . RLL pneumonia 02/29/2020  . Pulmonary nodule 1 cm or greater in diameter 02/29/2020  . Hormone replacement therapy (HRT) 02/29/2020  . S/P laparoscopic hysterectomy 12/2019 02/29/2020  . HTN (hypertension) 02/29/2020  . 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  Hx of Tubular adenoma 11/29/2016    Past Surgical History:  Procedure Laterality Date  . BRAIN SURGERY  2015   DURAL AV FISTULA ([place] [facility])  . COLONOSCOPY    . COLONOSCOPY WITH PROPOFOL N/A  02/20/2017   Procedure: COLONOSCOPY WITH PROPOFOL;  Surgeon: Manya Silvas, MD;  Location: [facility] ENDOSCOPY;  Service: Endoscopy;  Laterality: N/A;  . HYSTEROSCOPY WITH D & C N/A 07/29/2015   Procedure: DILATATION AND CURETTAGE /HYSTEROSCOPY;  Surgeon: Benjaman Kindler, MD;  Location: [facility] ORS;  Service: Gynecology;  Laterality: N/A;  . HYSTEROSCOPY WITH D & C N/A 01/24/2018   Procedure: DILATATION AND CURETTAGE /HYSTEROSCOPY;  Surgeon: Benjaman Kindler, MD;  Location: [facility] ORS;  Service: Gynecology;  Laterality: N/A;  . LAPAROSCOPY N/A 01/24/2018   Procedure: LAPAROSCOPY DIAGNOSTIC;  Surgeon: Benjaman Kindler, MD;  Location: [facility] ORS;  Service: Gynecology;  Laterality: N/A;  . TONSILLECTOMY     AGE 79  . TOTAL LAPAROSCOPIC HYSTERECTOMY WITH BILATERAL SALPINGO OOPHORECTOMY Bilateral 12/28/2019   Procedure: TOTAL LAPAROSCOPIC HYSTERECTOMY WITH BILATERAL SALPINGO OOPHORECTOMY;  Surgeon: Benjaman Kindler, MD;  Location: [facility] ORS;  Service: Gynecology;  Laterality: Bilateral;  . TUBAL LIGATION      Prior to Admission medications   Medication Sig Start Date End Date Taking? Authorizing Provider  buPROPion (WELLBUTRIN XL) 150 MG 24 hr tablet Take 150 mg by mouth every morning.  01/05/18  Yes [provider]  Calcium Carbonate-Vitamin D (OS-CAL 500 + D PO) Take 1 tablet by mouth daily.    Yes [provider]  cetirizine (ZYRTEC) 10 MG tablet Take 10 mg by mouth daily as needed for allergies.  Yes [provider]  Cholecalciferol (DIALYVITE VITAMIN D 5000) 125 MCG (5000 UT) capsule Take 5,000 Units by mouth daily.   Yes [provider]  estrogen, conjugated,-medroxyprogesterone (PREMPRO) 0.625-5 MG tablet Take 1 tablet by mouth every evening.    Yes [provider]  hydrochlorothiazide (HYDRODIURIL) 25 MG tablet Take 25 mg by mouth every evening.    Yes [provider]  Lutein 40 MG CAPS Take 40 mg by mouth daily.   Yes [provider]    vitamin B-12 (CYANOCOBALAMIN) 1000 MCG tablet Take 1,000 mcg by mouth daily.    Yes [provider]  docusate sodium (COLACE) 100 MG capsule Take 1 capsule (100 mg total) by mouth 2 (two) times daily. To keep stools soft Patient not taking: Reported on 02/29/2020 12/28/19   Benjaman Kindler, MD  oxyCODONE (OXY IR/ROXICODONE) 5 MG immediate release tablet Take 1 tablet (5 mg total) by mouth every 4 (four) hours as needed for severe pain. Patient not taking: Reported on 02/29/2020 12/28/19   Benjaman Kindler, MD    Allergies Patient has no known allergies.  No family history on file.  Social History Social History   Tobacco Use  . Smoking status: Former Smoker    Packs/day: 0.50    Years: 15.00    Pack years: 7.50    Types: Cigarettes    Quit date: 07/21/2000    Years since quitting: 19.6  . Smokeless tobacco: Never Used  Vaping Use  . Vaping Use: Never used  Substance Use Topics  . Alcohol use: Yes    Comment: RARE WINE MONTHLY  . Drug use: No    Review of Systems Constitutional: No fever/chills Eyes: No visual changes. ENT: No sore throat. Cardiovascular: Denies chest pain. Respiratory: +shortness of breath, worse with exertion Gastrointestinal: Right-sided abdominal pain.  Denies nausea, vomiting, and diarrhea. Genitourinary: Negative for dysuria. Musculoskeletal: No leg pain or swelling.  Negative for neck pain.  Negative for back pain. Integumentary: Negative for rash. Neurological: Negative for headaches, focal weakness or numbness.   ____________________________________________   PHYSICAL EXAM:  VITAL SIGNS: ED Triage Vitals  Enc Vitals Group     BP 02/28/20 1914 139/86     Pulse Rate 02/28/20 1914 (!) 106     Resp 02/28/20 1914 17     Temp 02/28/20 1914 97.8 F (36.6 C)     Temp Source 02/28/20 1914 Oral     SpO2 02/28/20 1914 99 %     Weight 02/28/20 1915 38.6 kg (85 lb)     Height 02/28/20 1915 1.524 m (5')     Head Circumference --       Peak Flow --      Pain Score 02/28/20 1922 10     Pain Loc --      Pain Edu? --      Excl. in [place]? --     Constitutional: Alert and oriented.  Appears uncomfortable. Eyes: Conjunctivae are normal.  Head: Atraumatic. Nose: No congestion/rhinnorhea. Mouth/Throat: Patient is wearing a mask. Neck: No stridor.  No meningeal signs.   Cardiovascular: Normal rate, regular rhythm. Good peripheral circulation. Grossly normal heart sounds. Respiratory: Increased respiratory effort with minimal accessory muscle usage although this could be due to pain/splinting.  Worsening pain with deep inspiration. Gastrointestinal: Soft and nontender. No distention.  Musculoskeletal: No lower extremity tenderness nor edema. No gross deformities of extremities. Neurologic:  Normal speech and language. No gross focal neurologic deficits are appreciated.  Skin:  Skin is  warm, dry and intact. Psychiatric: Mood and affect are normal. Speech and behavior are normal.  ____________________________________________   LABS (all labs ordered are listed, but only abnormal results are displayed)  Labs Reviewed  COMPREHENSIVE METABOLIC PANEL - Abnormal; Notable for the following components:      Result Value   Sodium 132 (*)    Potassium 3.3 (*)    Chloride 92 (*)    Albumin 3.3 (*)    Total Bilirubin 1.3 (*)    Anion gap 17 (*)    All other components within normal limits  CBC - Abnormal; Notable for the following components:   WBC 12.1 (*)    All other components within normal limits  URINALYSIS, COMPLETE (UACMP) WITH MICROSCOPIC - Abnormal; Notable for the following components:   Color, Urine AMBER (*)    APPearance CLOUDY (*)    Hgb urine dipstick SMALL (*)    Ketones, ur 20 (*)    Protein, ur 100 (*)    Leukocytes,Ua SMALL (*)    WBC, UA >50 (*)    Bacteria, UA RARE (*)    All other components within normal limits  SARS CORONAVIRUS 2 BY RT PCR (HOSPITAL ORDER, [place] [facility])    CULTURE, BLOOD (ROUTINE X 2)  CULTURE, BLOOD (ROUTINE X 2)  URINE CULTURE  LIPASE, BLOOD  PROTIME-INR  APTT  LACTIC ACID, PLASMA  LACTIC ACID, PLASMA  HIV ANTIBODY (ROUTINE TESTING W REFLEX)   ____________________________________________  EKG  ED ECG REPORT I, Hinda Kehr, the attending physician, personally viewed and interpreted this ECG.  Date: 02/29/2020 EKG Time: 1:13 AM Rate: 109 Rhythm: Sinus tachycardia QRS Axis: normal Intervals: normal ST/T Wave abnormalities: Non-specific ST segment / T-wave changes, but no clear evidence of acute ischemia. Narrative Interpretation: no definitive evidence of acute ischemia; does not meet STEMI criteria.   ____________________________________________  RADIOLOGY I, Hinda Kehr, personally viewed and evaluated these images (plain radiographs) as part of my medical decision making, as well as reviewing the written report by the radiologist.  I also discussed the case by phone with Dr. Sherrye Payor with radiology.  ED MD interpretation: Pulmonary nodule, mild to moderate severity multifocal infiltrates within the right lower lobe, pulmonary embolism involving the right pulmonary artery and several of its middle lobe and lower branches.  Moderate amount of DVT within the left common femoral vein.  Official radiology report(s): CT ABDOMEN PELVIS W CONTRAST  Addendum Date: 02/28/2020   ADDENDUM REPORT: 02/28/2020 23:05 ADDENDUM: Results were discussed with Dr. Ellender Hose at 10:56 p.m. [place] on February 28, 2020. Electronically Signed   By: Virgina Norfolk M.D.   On: 02/28/2020 23:05   Result Date: 02/28/2020 CLINICAL DATA:  Right lower quadrant pain. EXAM: CT ABDOMEN AND PELVIS WITH CONTRAST TECHNIQUE: Multidetector CT imaging of the abdomen and pelvis was performed using the standard protocol following bolus administration of intravenous contrast. CONTRAST:  14mL OMNIPAQUE IOHEXOL 300 MG/ML  SOLN COMPARISON:  None. FINDINGS: Lower chest:  Marked severity intraluminal low attenuation is seen within the right pulmonary artery and several of its middle lobe and lower lobe branches. Mild to moderate severity multifocal infiltrates are seen within the right lower lobe. A 1.1 cm x 1.2 cm noncalcified lung nodule versus focal atelectasis is seen within the lateral aspect of the mid right lung. A very small right pleural effusion is seen. Hepatobiliary: No focal liver abnormality is seen. No gallstones, gallbladder wall thickening, or biliary dilatation. Pancreas: Unremarkable. No pancreatic ductal dilatation or  surrounding inflammatory changes. Spleen: Normal in size without focal abnormality. Adrenals/Urinary Tract: Adrenal glands are unremarkable. Kidneys are normal, without renal calculi, focal lesion, or hydronephrosis. Bladder is unremarkable. Stomach/Bowel: Stomach is within normal limits. Appendix appears normal. No evidence of bowel dilatation. Mildly thickened small bowel loops are seen within the posterior aspect of the lower abdomen. Noninflamed diverticula are seen throughout the sigmoid colon. Vascular/Lymphatic: There is mild calcification of the abdominal aorta and bilateral common iliac arteries, without evidence of aneurysmal dilatation. A moderate amount of intraluminal low attenuation is seen within the left common femoral vein (axial CT images 72 through 85, CT series number 2). No enlarged abdominal or pelvic lymph nodes. Reproductive: Status post hysterectomy. No adnexal masses. Other: No abdominal wall hernia or abnormality. No abdominopelvic ascites. Musculoskeletal: There is approximately 2 mm anterolisthesis of the L4 vertebral body on L5, with mild multilevel degenerative changes noted throughout the remainder of the lumbar spine. IMPRESSION: 1. Marked severity pulmonary embolism involving the right pulmonary artery and several of its middle lobe and lower lobe branches. 2. Mild to moderate severity multifocal infiltrates within  the right lower lobe, consistent with pneumonia. 3. 1.1 cm x 1.2 cm noncalcified lung nodule versus focal atelectasis within the lateral aspect of the mid right lung. Correlation with follow-up chest CT is recommended to determine stability, as an underlying neoplastic process cannot be excluded. 4. Moderate amount of DVT within the left common femoral vein. Further evaluation with left lower extremity venous Doppler ultrasound is recommended. 5. Mildly thickened small bowel loops within the lower abdomen which may represent mild enteritis. 6. Sigmoid diverticulosis. Aortic Atherosclerosis (ICD10-I70.0). Electronically Signed: By: Virgina Norfolk M.D. On: 02/28/2020 22:52   US Venous Img Lower Bilateral  Result Date: 02/29/2020 CLINICAL DATA:  Left femoral DVT on recent CT examination. EXAM: BILATERAL LOWER EXTREMITY VENOUS DOPPLER ULTRASOUND TECHNIQUE: Gray-scale sonography with graded compression, as well as color Doppler and duplex ultrasound were performed to evaluate the lower extremity deep venous systems from the level of the common femoral vein and including the common femoral, femoral, profunda femoral, popliteal and calf veins including the posterior tibial, peroneal and gastrocnemius veins when visible. The superficial great saphenous vein was also interrogated. Spectral Doppler was utilized to evaluate flow at rest and with distal augmentation maneuvers in the common femoral, femoral and popliteal veins. COMPARISON:  None. FINDINGS: RIGHT LOWER EXTREMITY Common Femoral Vein: No evidence of thrombus. Normal compressibility, respiratory phasicity and response to augmentation. Saphenofemoral Junction: No evidence of thrombus. Normal compressibility and flow on color Doppler imaging. Profunda Femoral Vein: No evidence of thrombus. Normal compressibility and flow on color Doppler imaging. Femoral Vein: No evidence of thrombus. Normal compressibility, respiratory phasicity and response to augmentation.  Popliteal Vein: No evidence of thrombus. Normal compressibility, respiratory phasicity and response to augmentation. Calf Veins: No evidence of thrombus. Normal compressibility and flow on color Doppler imaging. Superficial Great Saphenous Vein: No evidence of thrombus. Normal compressibility. Venous Reflux:  None. Other Findings:  None. LEFT LOWER EXTREMITY Common Femoral Vein: Thrombus is noted with decreased compressibility. Saphenofemoral Junction: Thrombus is noted with decreased compressibility. Profunda Femoral Vein: No evidence of thrombus. Normal compressibility and flow on color Doppler imaging. Femoral Vein: Thrombus is noted with decreased compressibility. Popliteal Vein: Thrombus is noted with decreased compressibility. Calf Veins: Thrombus is noted in the peroneal vein. The posterior tibial vein is within normal limits. Superficial Great Saphenous Vein: No evidence of thrombus. Normal compressibility. Venous Reflux:  None. Other Findings:  None.  IMPRESSION: No evidence of deep venous thrombosis in the right lower extremity. Extensive deep venous thrombosis in the left lower extremity as described. Electronically Signed   By: Inez Catalina M.D.   On: 02/29/2020 00:56    ____________________________________________   PROCEDURES   Procedure(s) performed (including Critical Care):  .1-3 Lead EKG Interpretation Performed by: Hinda Kehr, MD Authorized by: Hinda Kehr, MD     Interpretation: abnormal     ECG rate:  108   ECG rate assessment: tachycardic     Rhythm: sinus tachycardia     Ectopy: none     Conduction: normal   .Critical Care Performed by: Hinda Kehr, MD Authorized by: Hinda Kehr, MD   Critical care provider statement:    Critical care time (minutes):  30   Critical care time was exclusive of:  Separately billable procedures and treating other patients   Critical care was necessary to treat or prevent imminent or life-threatening deterioration of the  following conditions:  Sepsis (PE and DVT requiring anticoagulation)   Critical care was time spent personally by me on the following activities:  Development of treatment plan with patient or surrogate, discussions with consultants, evaluation of patient's response to treatment, examination of patient, obtaining history from patient or surrogate, ordering and performing treatments and interventions, ordering and review of laboratory studies, ordering and review of radiographic studies, pulse oximetry, re-evaluation of patient's condition and review of old charts     ____________________________________________   [place] / MDM / [place] / ED COURSE  As part of my medical decision making, I reviewed the following data within the [place] notes reviewed and incorporated, Labs reviewed , EKG interpreted , Old chart reviewed, Discussed with admitting physician (Dr. Damita Dunnings), Discussed with radiologist (Dr. Sherrye Payor)  and Notes from prior ED visits   Differential diagnosis includes, but is not limited to, pulmonary embolism, DVT, cor pulmonale, pneumonia, sepsis, as yet undiagnosed neoplastic process  The patient is on the cardiac monitor to evaluate for evidence of arrhythmia and/or significant heart rate changes.  The patient had a work-up in triage including a CT scan of her abdomen and pelvis based on her primary symptom of abdominal pain.  The results were so abnormal that the radiologist called and spoke with my colleague Dr. Ellender Hose about the presence of the PE, DVT, and pneumonia.  We brought the patient right back to her room and I saw her within minutes of her coming to the exam room.  She is obviously uncomfortable and for her severe pain I am giving her morphine 4 mg IV and Zofran 4 mg IV to help prevent nausea associated with the opioids.  I am giving her 500 mL crystalloid bolus given her mild tachycardia and the concern for  pneumonia/sepsis.  She technically meets criteria for sepsis because of her tachycardia and her leukocytosis as well as findings suggestive of pneumonia on CT scan so I made her a code sepsis and I am treating with ceftriaxone 2 g IV and azithromycin 500 mg IV.  Blood cultures and lactic acid are pending.  I also ordered the bilateral lower extremity venous ultrasounds for further evaluation of the DVTs.  I am concerned about the probability of her having a neoplastic process which has led to her hypercoagulable state and she told me that she has a history of some small skin cancers but does not have a diagnosis of cancer.  Given the clot burden and her symptomatology I  ordered heparin per pharmacy protocol with a bolus plus infusion and will plan to admit her to the hospital.  I also call and touch base with radiology myself to check on additional recommendations including his opinion about the urgency of obtaining a CTA chest.  Given that she is not hypoxemic and not in severe respiratory distress I suspect it would be better to wait until the IV contrast has a chance to clear from her system.       Clinical Course as of Feb 28 109  Sun Feb 28, 2020  2336 Spoke with Dr. Sherrye Payor (radiology).  Confirmed diagnoses of PE, DVT, probable pneumonia.  He recommended that we wait 24 hours to get a CTA chest for further evaluation but said that we do not need to risk contrast-induced nephropathy tonight with additional imaging because we know the diagnosis.  The clot burden is extensive although right at this moment there is no clear evidence of right heart strain based on the suboptimal CT abdomen/pelvis with venous contrast.  I am proceeding with empiric treatment of probable pneumonia and additional testing as previously described and will admit to the hospital service for further treatment and evaluation.   [CF]  2347 Consulted hospitalist for admission   [CF]  Mon Feb 29, 2020  0011 Discussed case by  phone with Dr. Damita Dunnings with the hospitalist service who agrees with the plan and will admit.   [CF]  0014 Urine is positive for UTI, sending urine culture as well.  Urinalysis, Complete w Microscopic(!) [CF]  0109 I spoke with the CT technologist about the appropriate way to order a time CTA chest and put in an order for a CTA chest tonight as per the radiologist recommendations (24 hours after the original study).  I also updated Dr. Damita Dunnings with the plan so that she can pass along to the daytime hospitalist.   [CF]  0110 Extensive DVT and left lower extremity, no DVT in the right lower extremity.  US Venous Img Lower Bilateral [CF]    Clinical Course User Index [CF] Hinda Kehr, MD     ____________________________________________  FINAL CLINICAL IMPRESSION(S) / ED DIAGNOSES  Final diagnoses:  Sepsis, due to unspecified organism, unspecified whether acute organ dysfunction present [facility])  Community acquired pneumonia, unspecified laterality  Acute pulmonary embolism, unspecified pulmonary embolism type, unspecified whether acute cor pulmonale present ([place])  Acute deep vein thrombosis (DVT) of femoral vein of left lower extremity ([place])  Pulmonary nodule  Urinary tract infection without hematuria, site unspecified     MEDICATIONS GIVEN DURING THIS VISIT:  Medications  fentaNYL (SUBLIMAZE) injection 50 mcg (50 mcg Intravenous Given 02/28/20 2222)  lactated ringers infusion (has no administration in time range)  cefTRIAXone (ROCEPHIN) 2 g in sodium chloride 0.9 % 100 mL IVPB (0 g Intravenous Stopped 02/29/20 0051)  azithromycin (ZITHROMAX) 500 mg in sodium chloride 0.9 % 250 mL IVPB (0 mg Intravenous Stopped 02/29/20 0109)  heparin bolus via infusion 2,700 Units (has no administration in time range)    Followed by  heparin ADULT infusion 100 units/mL (25000 units/217mL sodium chloride 0.45%) (has no administration in time range)  0.9 %  sodium chloride infusion (has no administration  in time range)  sodium chloride flush (NS) 0.9 % injection 10-40 mL (has no administration in time range)  sodium chloride flush (NS) 0.9 % injection 10-40 mL (has no administration in time range)  iohexol (OMNIPAQUE) 300 MG/ML solution 75 mL (75 mLs Intravenous Contrast Given 02/28/20 2228)  lactated ringers bolus 500 mL (0 mLs Intravenous Stopped 02/29/20 0052)  morphine 4 MG/ML injection 4 mg (4 mg Intravenous Given 02/29/20 0026)  ondansetron (ZOFRAN) injection 4 mg (4 mg Intravenous Given 02/29/20 0025)     ED Discharge Orders    None      *Please note:  Patricia Miller was evaluated in Emergency Department on 02/29/2020 for the symptoms described in the history of present illness. She was evaluated in the context of the global COVID-19 pandemic, which necessitated consideration that the patient might be at risk for infection with the SARS-CoV-2 virus that causes COVID-19. Institutional protocols and algorithms that pertain to the evaluation of patients at risk for COVID-19 are in a state of rapid change based on information released by regulatory bodies including the CDC and federal and state organizations. These policies and algorithms were followed during the patient's care in the ED.  Some ED evaluations and interventions may be delayed as a result of limited staffing during and after the pandemic.*  Note:  This document was prepared using Dragon voice recognition software and may include unintentional dictation errors.   Hinda Kehr, MD 02/29/20 0120

## 2020-02-28 NOTE — ED Triage Notes (Signed)
Pt arrives ambulatory to triage with c/o lower right abdominal pain x 2-3 days. Pt does have appendix and gallbladder but states that pain is localized to the RLQ.

## 2020-02-29 ENCOUNTER — Encounter
Admission: EM | Disposition: A | Discharge status: Home / Self Care | Payer: Self-pay | Source: Home / Self Care | Attending: Internal Medicine

## 2020-02-29 ENCOUNTER — Other Ambulatory Visit (INDEPENDENT_AMBULATORY_CARE_PROVIDER_SITE_OTHER): Payer: Self-pay | Admitting: Vascular Surgery

## 2020-02-29 ENCOUNTER — Encounter: Payer: Self-pay | Admitting: Internal Medicine

## 2020-02-29 ENCOUNTER — Other Ambulatory Visit: Payer: Self-pay

## 2020-02-29 DIAGNOSIS — A419 Sepsis, unspecified organism: Secondary | ICD-10-CM | POA: Diagnosis not present

## 2020-02-29 DIAGNOSIS — Z90722 Acquired absence of ovaries, bilateral: Secondary | ICD-10-CM | POA: Diagnosis not present

## 2020-02-29 DIAGNOSIS — I2609 Other pulmonary embolism with acute cor pulmonale: Secondary | ICD-10-CM | POA: Diagnosis present

## 2020-02-29 DIAGNOSIS — Z9079 Acquired absence of other genital organ(s): Secondary | ICD-10-CM | POA: Diagnosis not present

## 2020-02-29 DIAGNOSIS — N390 Urinary tract infection, site not specified: Secondary | ICD-10-CM | POA: Diagnosis present

## 2020-02-29 DIAGNOSIS — F419 Anxiety disorder, unspecified: Secondary | ICD-10-CM | POA: Diagnosis present

## 2020-02-29 DIAGNOSIS — K529 Noninfective gastroenteritis and colitis, unspecified: Secondary | ICD-10-CM | POA: Diagnosis present

## 2020-02-29 DIAGNOSIS — J181 Lobar pneumonia, unspecified organism: Secondary | ICD-10-CM | POA: Diagnosis present

## 2020-02-29 DIAGNOSIS — Z79890 Hormone replacement therapy (postmenopausal): Secondary | ICD-10-CM

## 2020-02-29 DIAGNOSIS — R413 Other amnesia: Secondary | ICD-10-CM | POA: Diagnosis not present

## 2020-02-29 DIAGNOSIS — I119 Hypertensive heart disease without heart failure: Secondary | ICD-10-CM | POA: Diagnosis present

## 2020-02-29 DIAGNOSIS — Z20822 Contact with and (suspected) exposure to covid-19: Secondary | ICD-10-CM | POA: Diagnosis present

## 2020-02-29 DIAGNOSIS — J189 Pneumonia, unspecified organism: Secondary | ICD-10-CM | POA: Diagnosis not present

## 2020-02-29 DIAGNOSIS — Z90710 Acquired absence of both cervix and uterus: Secondary | ICD-10-CM | POA: Diagnosis not present

## 2020-02-29 DIAGNOSIS — I82412 Acute embolism and thrombosis of left femoral vein: Secondary | ICD-10-CM

## 2020-02-29 DIAGNOSIS — I10 Essential (primary) hypertension: Secondary | ICD-10-CM

## 2020-02-29 DIAGNOSIS — I2699 Other pulmonary embolism without acute cor pulmonale: Secondary | ICD-10-CM

## 2020-02-29 DIAGNOSIS — I82402 Acute embolism and thrombosis of unspecified deep veins of left lower extremity: Secondary | ICD-10-CM

## 2020-02-29 DIAGNOSIS — D62 Acute posthemorrhagic anemia: Secondary | ICD-10-CM | POA: Diagnosis not present

## 2020-02-29 DIAGNOSIS — E876 Hypokalemia: Secondary | ICD-10-CM | POA: Diagnosis present

## 2020-02-29 DIAGNOSIS — Z87891 Personal history of nicotine dependence: Secondary | ICD-10-CM | POA: Diagnosis not present

## 2020-02-29 DIAGNOSIS — R911 Solitary pulmonary nodule: Secondary | ICD-10-CM

## 2020-02-29 DIAGNOSIS — Z85820 Personal history of malignant melanoma of skin: Secondary | ICD-10-CM | POA: Diagnosis not present

## 2020-02-29 DIAGNOSIS — Z23 Encounter for immunization: Secondary | ICD-10-CM | POA: Diagnosis present

## 2020-02-29 DIAGNOSIS — I82493 Acute embolism and thrombosis of other specified deep vein of lower extremity, bilateral: Secondary | ICD-10-CM | POA: Diagnosis not present

## 2020-02-29 DIAGNOSIS — Z79899 Other long term (current) drug therapy: Secondary | ICD-10-CM | POA: Diagnosis not present

## 2020-02-29 DIAGNOSIS — K5730 Diverticulosis of large intestine without perforation or abscess without bleeding: Secondary | ICD-10-CM | POA: Diagnosis present

## 2020-02-29 HISTORY — PX: PERIPHERAL VASCULAR THROMBECTOMY: CATH118306

## 2020-02-29 LAB — BLOOD CULTURE ID PANEL (REFLEXED) - BCID2

## 2020-02-29 LAB — COMPREHENSIVE METABOLIC PANEL
ALT: 8 U/L (ref 0–44)
AST: 13 U/L — ABNORMAL LOW (ref 15–41)
Albumin: 2.9 g/dL — ABNORMAL LOW (ref 3.5–5.0)
Alkaline Phosphatase: 63 U/L (ref 38–126)
Anion gap: 13 (ref 5–15)
BUN: 10 mg/dL (ref 8–23)
CO2: 22 mmol/L (ref 22–32)
Calcium: 8.2 mg/dL — ABNORMAL LOW (ref 8.9–10.3)
Chloride: 97 mmol/L — ABNORMAL LOW (ref 98–111)
Creatinine, Ser: 0.79 mg/dL (ref 0.44–1.00)
GFR calc Af Amer: >60 mL/min (ref >60)
GFR calc non Af Amer: >60 mL/min (ref >60)
Glucose, Bld: 86 mg/dL (ref 70–99)
Potassium: 3.1 mmol/L — ABNORMAL LOW (ref 3.5–5.1)
Sodium: 132 mmol/L — ABNORMAL LOW (ref 135–145)
Total Bilirubin: 0.9 mg/dL (ref 0.3–1.2)
Total Protein: 7 g/dL (ref 6.5–8.1)

## 2020-02-29 LAB — PROTIME-INR
INR: 1.1 (ref 0.8–1.2)
Prothrombin Time: 13.4 seconds (ref 11.4–15.2)

## 2020-02-29 LAB — URINALYSIS, COMPLETE (UACMP) WITH MICROSCOPIC
Bilirubin Urine: NEGATIVE
Glucose, UA: NEGATIVE mg/dL
Ketones, ur: 20 mg/dL — AB
Nitrite: NEGATIVE
Protein, ur: 100 mg/dL — AB
Specific Gravity, Urine: 1.029 (ref 1.005–1.030)
WBC, UA: >50 WBC/hpf — ABNORMAL HIGH (ref 0–5)
pH: 5 (ref 5.0–8.0)

## 2020-02-29 LAB — SARS CORONAVIRUS 2 BY RT PCR (HOSPITAL ORDER, PERFORMED IN ~~LOC~~ HOSPITAL LAB): SARS Coronavirus 2: NEGATIVE

## 2020-02-29 LAB — HIV ANTIBODY (ROUTINE TESTING W REFLEX): HIV Screen 4th Generation wRfx: NONREACTIVE

## 2020-02-29 LAB — HEPARIN LEVEL (UNFRACTIONATED): Heparin Unfractionated: <0.1 IU/mL — ABNORMAL LOW (ref 0.30–0.70)

## 2020-02-29 LAB — APTT: aPTT: 29 seconds (ref 24–36)

## 2020-02-29 LAB — LACTIC ACID, PLASMA: Lactic Acid, Venous: 1.6 mmol/L (ref 0.5–1.9)

## 2020-02-29 SURGERY — PERIPHERAL VASCULAR THROMBECTOMY
Anesthesia: Moderate Sedation

## 2020-02-29 MED ORDER — MIDAZOLAM HCL 5 MG/5ML IJ SOLN
INTRAMUSCULAR | Status: AC
  Filled 2020-02-29: qty 5

## 2020-02-29 MED ORDER — VANCOMYCIN HCL IN DEXTROSE 1-5 GM/200ML-% IV SOLN
1000.0000 mg | INTRAVENOUS | Status: DC
  Administered 2020-03-01 – 2020-03-03 (×2): 1000 mg via INTRAVENOUS
  Filled 2020-02-29 (×4): qty 200

## 2020-02-29 MED ORDER — IODIXANOL 320 MG/ML IV SOLN
INTRAVENOUS | Status: DC | PRN
  Administered 2020-02-29: 75 mL

## 2020-02-29 MED ORDER — HYDROMORPHONE HCL 1 MG/ML IJ SOLN: 1.0000 mg | Freq: Once | INTRAMUSCULAR | Status: DC | PRN

## 2020-02-29 MED ORDER — ALTEPLASE 2 MG IJ SOLR
INTRAMUSCULAR | Status: DC | PRN
  Administered 2020-02-29: 6 mg

## 2020-02-29 MED ORDER — SODIUM CHLORIDE 0.9% FLUSH
10.0000 mL | INTRAVENOUS | Status: DC | PRN
  Administered 2020-03-05: 20 mL

## 2020-02-29 MED ORDER — SODIUM CHLORIDE 0.9% FLUSH
10.0000 mL | Freq: Two times a day (BID) | INTRAVENOUS | Status: DC
  Administered 2020-03-01 – 2020-03-05 (×8): 10 mL

## 2020-02-29 MED ORDER — SODIUM CHLORIDE 0.9 % IV SOLN: INTRAVENOUS | Status: DC

## 2020-02-29 MED ORDER — ONDANSETRON HCL 4 MG/2ML IJ SOLN: 4.0000 mg | Freq: Four times a day (QID) | INTRAMUSCULAR | Status: DC | PRN

## 2020-02-29 MED ORDER — FAMOTIDINE 20 MG PO TABS: 40.0000 mg | ORAL_TABLET | Freq: Once | ORAL | Status: DC | PRN

## 2020-02-29 MED ORDER — MIDAZOLAM HCL 2 MG/2ML IJ SOLN
INTRAMUSCULAR | Status: DC | PRN
  Administered 2020-02-29: 1 mg via INTRAVENOUS
  Administered 2020-02-29: 2 mg via INTRAVENOUS

## 2020-02-29 MED ORDER — FENTANYL CITRATE (PF) 100 MCG/2ML IJ SOLN
INTRAMUSCULAR | Status: AC
  Filled 2020-02-29: qty 2

## 2020-02-29 MED ORDER — MIDAZOLAM HCL 2 MG/ML PO SYRP: 8.0000 mg | ORAL_SOLUTION | Freq: Once | ORAL | Status: DC | PRN

## 2020-02-29 MED ORDER — ADULT MULTIVITAMIN W/MINERALS CH
1.0000 | ORAL_TABLET | Freq: Every day | ORAL | Status: DC
  Administered 2020-03-01 – 2020-03-05 (×5): 1 via ORAL
  Filled 2020-02-29 (×6): qty 1

## 2020-02-29 MED ORDER — FENTANYL CITRATE (PF) 100 MCG/2ML IJ SOLN
INTRAMUSCULAR | Status: DC | PRN
Start: 2020-02-29 — End: 2020-02-29
  Administered 2020-02-29: 50 ug via INTRAVENOUS
  Administered 2020-02-29: 25 ug via INTRAVENOUS

## 2020-02-29 MED ORDER — PNEUMOCOCCAL VAC POLYVALENT 25 MCG/0.5ML IJ INJ
0.5000 mL | INJECTION | INTRAMUSCULAR | Status: AC
  Administered 2020-03-01: 0.5 mL via INTRAMUSCULAR
  Filled 2020-02-29: qty 0.5

## 2020-02-29 MED ORDER — HEPARIN SODIUM (PORCINE) 1000 UNIT/ML IJ SOLN
INTRAMUSCULAR | Status: DC | PRN
  Administered 2020-02-29: 3000 [IU] via INTRAVENOUS

## 2020-02-29 MED ORDER — VANCOMYCIN HCL 1250 MG/250ML IV SOLN
1250.0000 mg | Freq: Once | INTRAVENOUS | Status: AC
  Administered 2020-02-29: 1250 mg via INTRAVENOUS
  Filled 2020-02-29: qty 250

## 2020-02-29 MED ORDER — OXYCODONE HCL 5 MG PO TABS
5.0000 mg | ORAL_TABLET | Freq: Four times a day (QID) | ORAL | Status: DC | PRN
  Administered 2020-02-29 – 2020-03-04 (×10): 5 mg via ORAL
  Filled 2020-02-29 (×10): qty 1

## 2020-02-29 MED ORDER — DIPHENHYDRAMINE HCL 50 MG/ML IJ SOLN: 50.0000 mg | Freq: Once | INTRAMUSCULAR | Status: DC | PRN

## 2020-02-29 MED ORDER — METHYLPREDNISOLONE SODIUM SUCC 125 MG IJ SOLR: 125.0000 mg | Freq: Once | INTRAMUSCULAR | Status: DC | PRN

## 2020-02-29 MED ORDER — HEPARIN SODIUM (PORCINE) 1000 UNIT/ML IJ SOLN
INTRAMUSCULAR | Status: AC
  Filled 2020-02-29: qty 1

## 2020-02-29 MED ORDER — BUPROPION HCL ER (XL) 150 MG PO TB24
150.0000 mg | ORAL_TABLET | Freq: Every day | ORAL | Status: DC
  Administered 2020-02-29 – 2020-03-05 (×6): 150 mg via ORAL
  Filled 2020-02-29 (×6): qty 1

## 2020-02-29 MED ORDER — POTASSIUM CHLORIDE CRYS ER 20 MEQ PO TBCR
40.0000 meq | EXTENDED_RELEASE_TABLET | Freq: Once | ORAL | Status: AC
  Administered 2020-02-29: 40 meq via ORAL
  Filled 2020-02-29: qty 2

## 2020-02-29 MED ORDER — ENSURE ENLIVE PO LIQD
237.0000 mL | Freq: Two times a day (BID) | ORAL | Status: DC
  Administered 2020-03-01 – 2020-03-03 (×5): 237 mL via ORAL

## 2020-02-29 SURGICAL SUPPLY — 22 items
CANISTER PENUMBRA ENGINE (MISCELLANEOUS) ×3 IMPLANT
CANNULA 5F STIFF (CANNULA) ×3 IMPLANT
CATH ANGIO 5F PIGTAIL 100CM (CATHETERS) ×3 IMPLANT
CATH CXI SUPP ANG 4FR 135 (CATHETERS) ×1 IMPLANT
CATH CXI SUPP ANG 4FR 135CM (CATHETERS) ×3
CATH INDIGO 12XTORQ 100 (CATHETERS) ×3 IMPLANT
CATH INDIGO 8 TORQ TIP 85CM (CATHETERS) ×3 IMPLANT
CATH INDIGO 8 XTORQ TIP 115CM (CATHETERS) ×3 IMPLANT
CATH INDIGO SEP 8 (CATHETERS) ×3 IMPLANT
CATH INFINITI JR4 5F (CATHETERS) ×3 IMPLANT
CATH SELECT BERN TIP 5F 130 (CATHETERS) ×3 IMPLANT
DRYSEAL FLEXSHEATH 12FR 33CM (SHEATH) ×2
GLIDEWIRE ADV .035X180CM (WIRE) ×3 IMPLANT
KIT FEMORAL DEL DENALI (Miscellaneous) ×3 IMPLANT
PACK ANGIOGRAPHY (CUSTOM PROCEDURE TRAY) ×3 IMPLANT
SHEATH DRYSEAL FLEX 12FR 33CM (SHEATH) ×1 IMPLANT
SHEATH PINNACLE 11FRX10 (SHEATH) ×6 IMPLANT
SUT PROLENE 0 SH 30 (SUTURE) ×3 IMPLANT
SYR MEDRAD MARK 7 150ML (SYRINGE) ×3 IMPLANT
TUBING CONTRAST HIGH PRESS 72 (TUBING) ×3 IMPLANT
WIRE J 3MM .035X145CM (WIRE) ×3 IMPLANT
WIRE MAGIC TORQUE 260C (WIRE) ×6 IMPLANT

## 2020-02-29 NOTE — Progress Notes (Signed)
ANTICOAGULATION CONSULT NOTE - Initial Consult  Pharmacy Consult for Heparin  Indication: PE/DVT  No Known Allergies  Patient Measurements: Height: 5' (152.4 cm) Weight: 49.9 kg (113 lb 6.4 oz) IBW/kg (Calculated) : 45.5  Heparin dosing weight: 49.9 kg  Vital Signs: Temp: 98.5 F (36.9 C) (09/20 0804) Temp Source: Oral (09/20 0804) BP: 129/68 (09/20 0804) Pulse Rate: 108 (09/20 0804)  Labs: Recent Labs    02/28/20 1916 02/29/20 0008  HGB 12.9  --   HCT 38.5  --   PLT 342  --   APTT  --  29  LABPROT  --  13.4  INR  --  1.1  CREATININE 0.92  --     Estimated Creatinine Clearance: 37.4 mL/min (by C-G formula based on SCr of 0.92 mg/dL).  Medical History: Past Medical History:  Diagnosis Date  . Anxiety   . Arthritis    NECK  . Cancer ([place])    BASAL CELL AND MELANOMA  . DAVF (dural arteriovenous fistula) 2015  . Hypertension     Medications:  Medications Prior to Admission  Medication Sig Dispense Refill Last Dose  . buPROPion (WELLBUTRIN XL) 150 MG 24 hr tablet Take 150 mg by mouth every morning.   6 Past Week at Unknown time  . Calcium Carbonate-Vitamin D (OS-CAL 500 + D PO) Take 1 tablet by mouth daily.    Past Week at Unknown time  . cetirizine (ZYRTEC) 10 MG tablet Take 10 mg by mouth daily as needed for allergies.    Past Week at Unknown time  . Cholecalciferol (DIALYVITE VITAMIN D 5000) 125 MCG (5000 UT) capsule Take 5,000 Units by mouth daily.   Past Week at Unknown time  . estrogen, conjugated,-medroxyprogesterone (PREMPRO) 0.625-5 MG tablet Take 1 tablet by mouth every evening.    Past Week at Unknown time  . hydrochlorothiazide (HYDRODIURIL) 25 MG tablet Take 25 mg by mouth every evening.    Past Week at Unknown time  . Lutein 40 MG CAPS Take 40 mg by mouth daily.   Past Week at Unknown time  . vitamin B-12 (CYANOCOBALAMIN) 1000 MCG tablet Take 1,000 mcg by mouth daily.    Past Week at Unknown time  . docusate sodium (COLACE) 100 MG capsule Take 1  capsule (100 mg total) by mouth 2 (two) times daily. To keep stools soft (Patient not taking: Reported on 02/29/2020) 30 capsule 0 Completed Course at Unknown time  . oxyCODONE (OXY IR/ROXICODONE) 5 MG immediate release tablet Take 1 tablet (5 mg total) by mouth every 4 (four) hours as needed for severe pain. (Patient not taking: Reported on 02/29/2020) 20 tablet 0 Completed Course at Unknown time    Assessment: 74yo female with medical history significant for HTN, basal cell carcinoma, history of tubular adenoma, status post laparoscopic hysterectomy 12/28/2019 on HRT's. Pt c/o abdominal pain; CTA abdomen and pelvis showing extensive PE w/o heart strain. 9/19 Doppler revealed  DVT in LLE; no evidence of DVT in RLE. Pharmacy has been consulted for heparin for dosing and monitoring.   Per chart review, no anticoagulants PTA.  Hgb 12.9, Plt 342  9/19 Heparin 2700 units bolus x 1 then infusion at 650 units/hr 9/20 @ 0923 subtherapeutic at <0.10 - verified with nurse that there were no interruptions to heparin drip. Patient went for thrombectomy and had alteplase (@1152 ) and 3000 units heparin (@1144 ) during the procedure. Heparin was turned off at 1130 for ~2h for procedure. Restarted at same rate at 1324.  Goal of Therapy:  Heparin level 0.3-0.7 units/ml Monitor platelets by anticoagulation protocol: Yes   Plan:  HL subtherapeutic <0.10 @0923 . Will hold off on giving bolus since patient received 3000 units heparin during procedure and alteplase. Will increase the rate to 800 units/hr. Check HL in 8 hours and daily CBC with AM labs  Sherilyn Banker, PharmD Pharmacy Resident  02/29/2020 9:11 AM

## 2020-02-29 NOTE — Progress Notes (Signed)
   02/29/20 2303  Assess: MEWS Score  Temp 98.1 F (36.7 C)  BP 126/81  Pulse Rate (!) 115  Resp 19  Level of Consciousness Alert  SpO2 99 %  O2 Device Room Air   Patient has been tachycardic. MEWS previously yellow. Patient resting in bed watching television. Will continue vitals q4

## 2020-02-29 NOTE — H&P (Signed)
History and Physical    Patricia Miller YDX:[id number] DOB: 10/04/1945 DOA: 02/28/2020  PCP: Rusty Aus, MD   Patient coming from: Home  I have personally briefly reviewed patient's old medical records in [place]  Chief Complaint: Abdominal pain  HPI: Patricia Miller is a 74 y.o. female with medical history significant for HTN, basal cell carcinoma, history of tubular adenoma, status post laparoscopic hysterectomy 12/28/2019 on HRT's, who presents to the emergency room with a several day history of dull achy right upper and lower abdominal pain, nonradiating, of moderate to severe intensity now associated with shortness of breath.  Pain is worse on taking a deep breath and on moving around.  She denies chest pain, denies cough, fever or chills.  Denies nausea, vomiting or change in bowel habits.  No history of cholecystectomy or appendectomy. ED Course: On arrival, she was afebrile, tachycardic at 106 with O2 sat 99% on room air and normotensive.  Blood work significant for leukocytosis of 12,000, mild electrolyte abnormalities, pyuria on urinalysis, normal lactic acid.  CT abdomen and pelvis showed marked severity PE involving right pulmonary artery and several of its middle lobe and lower lobe branches, mild to moderate severity multifocal infiltrates within the right lower lobe consistent with pneumonia as well as a noncalcified lung nodule versus atelectasis in the mid right lung with recommendation for follow-up chest CT to rule out underlying neoplastic process.  Also showed moderate amount DVT left common femoral vein with recommendation for follow-up Doppler ultrasound.  Mildly thickened small bowel loops lower abdomen which may represent mild enteritis but otherwise no acute intraabdominal/intrapelvic process.  Patient was started on IV heparin and IV antibiotics.  The emergency room provider spoke with radiologist, Dr. Sherrye Payor who recommended waiting 24 hours to get CTA chest  for further evaluation.  No evidence of right heart strain was seen in spite of extensive clot burden from ED documentation of discussion with radiologist.  Hospitalist consulted for admission. EKG: My independent interpretation : Sinus tachycardia with no acute ST-T wave changes  Review of Systems: As per HPI otherwise all other systems on review of systems negative.    Past Medical History:  Diagnosis Date  . Anxiety   . Arthritis    NECK  . Cancer ([place])    BASAL CELL AND MELANOMA  . DAVF (dural arteriovenous fistula) 2015  . Hypertension     Past Surgical History:  Procedure Laterality Date  . BRAIN SURGERY  2015   DURAL AV FISTULA ([facility])  . COLONOSCOPY    . COLONOSCOPY WITH PROPOFOL N/A 02/20/2017   Procedure: COLONOSCOPY WITH PROPOFOL;  Surgeon: Manya Silvas, MD;  Location: [facility] ENDOSCOPY;  Service: Endoscopy;  Laterality: N/A;  . HYSTEROSCOPY WITH D & C N/A 07/29/2015   Procedure: DILATATION AND CURETTAGE /HYSTEROSCOPY;  Surgeon: Benjaman Kindler, MD;  Location: [facility] ORS;  Service: Gynecology;  Laterality: N/A;  . HYSTEROSCOPY WITH D & C N/A 01/24/2018   Procedure: DILATATION AND CURETTAGE /HYSTEROSCOPY;  Surgeon: Benjaman Kindler, MD;  Location: [facility] ORS;  Service: Gynecology;  Laterality: N/A;  . LAPAROSCOPY N/A 01/24/2018   Procedure: LAPAROSCOPY DIAGNOSTIC;  Surgeon: Benjaman Kindler, MD;  Location: [facility] ORS;  Service: Gynecology;  Laterality: N/A;  . TONSILLECTOMY     AGE 23  . TOTAL LAPAROSCOPIC HYSTERECTOMY WITH BILATERAL SALPINGO OOPHORECTOMY Bilateral 12/28/2019   Procedure: TOTAL LAPAROSCOPIC HYSTERECTOMY WITH BILATERAL SALPINGO OOPHORECTOMY;  Surgeon: Benjaman Kindler, MD;  Location: [facility] ORS;  Service: Gynecology;  Laterality: Bilateral;  .  TUBAL LIGATION       reports that she quit smoking about 19 years ago. Her smoking use included cigarettes. She has a 7.50 pack-year smoking history. She has never used smokeless tobacco. She reports current alcohol  use. She reports that she does not use drugs.  No Known Allergies  History reviewed. No pertinent family history.    Prior to Admission medications   Medication Sig Start Date End Date Taking? Authorizing Provider  buPROPion (WELLBUTRIN XL) 150 MG 24 hr tablet Take 150 mg by mouth every morning.  01/05/18   [provider]  Calcium Carbonate-Vitamin D (OS-CAL 500 + D PO) Take 1 tablet by mouth daily.     [provider]  cetirizine (ZYRTEC) 10 MG tablet Take 10 mg by mouth daily as needed for allergies.     [provider]  Cholecalciferol (DIALYVITE VITAMIN D 5000) 125 MCG (5000 UT) capsule Take 5,000 Units by mouth daily.    [provider]  Cyanocobalamin (VITAMIN B-12 PO) Take 1 tablet by mouth daily.     [provider]  docusate sodium (COLACE) 100 MG capsule Take 1 capsule (100 mg total) by mouth 2 (two) times daily. To keep stools soft 12/28/19   Benjaman Kindler, MD  estrogen, conjugated,-medroxyprogesterone (PREMPRO) 0.625-5 MG tablet Take 1 tablet by mouth every evening.     [provider]  gabapentin (NEURONTIN) 600 MG tablet Take 0.5 tablets (300 mg total) by mouth at bedtime for 14 days. May double if desired. Take nightly for 3 days, then up to 14 days as needed 12/28/19 01/11/20  Benjaman Kindler, MD  hydrochlorothiazide (HYDRODIURIL) 25 MG tablet Take 25 mg by mouth every evening.     [provider]  Lutein 40 MG CAPS Take 40 mg by mouth daily.    [provider]  oxyCODONE (OXY IR/ROXICODONE) 5 MG immediate release tablet Take 1 tablet (5 mg total) by mouth every 4 (four) hours as needed for severe pain. 12/28/19   Benjaman Kindler, MD    Physical Exam: Vitals:   02/28/20 1914 02/28/20 1915  BP: 139/86   Pulse: (!) 106   Resp: 17   Temp: 97.8 F (36.6 C)   TempSrc: Oral   SpO2: 99%   Weight:  38.6 kg  Height:  5' (1.524 m)     Vitals:   02/28/20 1914 02/28/20 1915  BP: 139/86   Pulse: (!) 106    Resp: 17   Temp: 97.8 F (36.6 C)   TempSrc: Oral   SpO2: 99%   Weight:  38.6 kg  Height:  5' (1.524 m)      Constitutional: Alert and oriented x 3 . Not in any apparent distress HEENT:      Head: Normocephalic and atraumatic.         Eyes: PERLA, EOMI, Conjunctivae are normal. Sclera is non-icteric.       Mouth/Throat: Mucous membranes are moist.       Neck: Supple with no signs of meningismus. Cardiovascular: Regular rate and rhythm. No murmurs, gallops, or rubs. 2+ symmetrical distal pulses are present . No JVD. No LE edema Respiratory: Respiratory effort slightly increased.Lungs sounds diminished bilaterally.  Few scattered wheezes, crackles, no rhonchi.  Gastrointestinal: Soft, non tender, and non distended with positive bowel sounds. No rebound or guarding. Genitourinary: No CVA tenderness. Musculoskeletal: Nontender with normal range of motion in all extremities. No cyanosis, or erythema of extremities. Neurologic:  Face is symmetric. Moving all extremities. No gross  focal neurologic deficits . Skin: Skin is warm, dry.  No rash or ulcers Psychiatric: Mood and affect are normal    Labs on Admission: I have personally reviewed following labs and imaging studies  CBC: Recent Labs  Lab 02/28/20 1916  WBC 12.1*  HGB 12.9  HCT 38.5  MCV 89.3  PLT 295   Basic Metabolic Panel: Recent Labs  Lab 02/28/20 1916  NA 132*  K 3.3*  CL 92*  CO2 23  GLUCOSE 96  BUN 14  CREATININE 0.92  CALCIUM 8.9   GFR: Estimated Creatinine Clearance: 31.7 mL/min (by C-G formula based on SCr of 0.92 mg/dL). Liver Function Tests: Recent Labs  Lab 02/28/20 1916  AST 15  ALT 9  ALKPHOS 67  BILITOT 1.3*  PROT 7.8  ALBUMIN 3.3*   Recent Labs  Lab 02/28/20 1916  LIPASE 23   No results for input(s): AMMONIA in the last 168 hours. Coagulation Profile: Recent Labs  Lab 02/29/20 0008  INR 1.1   Cardiac Enzymes: No results for input(s): CKTOTAL, CKMB, CKMBINDEX, TROPONINI  in the last 168 hours. BNP (last 3 results) No results for input(s): PROBNP in the last 8760 hours. HbA1C: No results for input(s): HGBA1C in the last 72 hours. CBG: No results for input(s): GLUCAP in the last 168 hours. Lipid Profile: No results for input(s): CHOL, HDL, LDLCALC, TRIG, CHOLHDL, LDLDIRECT in the last 72 hours. Thyroid Function Tests: No results for input(s): TSH, T4TOTAL, FREET4, T3FREE, THYROIDAB in the last 72 hours. Anemia Panel: No results for input(s): VITAMINB12, FOLATE, FERRITIN, TIBC, IRON, RETICCTPCT in the last 72 hours. Urine analysis:    Component Value Date/Time   COLORURINE AMBER (A) 02/28/2020 1916   APPEARANCEUR CLOUDY (A) 02/28/2020 1916   LABSPEC 1.029 02/28/2020 1916   PHURINE 5.0 02/28/2020 1916   GLUCOSEU NEGATIVE 02/28/2020 1916   HGBUR SMALL (A) 02/28/2020 1916   BILIRUBINUR NEGATIVE 02/28/2020 1916   KETONESUR 20 (A) 02/28/2020 1916   PROTEINUR 100 (A) 02/28/2020 1916   NITRITE NEGATIVE 02/28/2020 1916   LEUKOCYTESUR SMALL (A) 02/28/2020 1916    Radiological Exams on Admission: CT ABDOMEN PELVIS W CONTRAST  Addendum Date: 02/28/2020   ADDENDUM REPORT: 02/28/2020 23:05 ADDENDUM: Results were discussed with Dr. Ellender Hose at 10:56 p.m. [place] on February 28, 2020. Electronically Signed   By: Virgina Norfolk M.D.   On: 02/28/2020 23:05   Result Date: 02/28/2020 CLINICAL DATA:  Right lower quadrant pain. EXAM: CT ABDOMEN AND PELVIS WITH CONTRAST TECHNIQUE: Multidetector CT imaging of the abdomen and pelvis was performed using the standard protocol following bolus administration of intravenous contrast. CONTRAST:  41mL OMNIPAQUE IOHEXOL 300 MG/ML  SOLN COMPARISON:  None. FINDINGS: Lower chest: Marked severity intraluminal low attenuation is seen within the right pulmonary artery and several of its middle lobe and lower lobe branches. Mild to moderate severity multifocal infiltrates are seen within the right lower lobe. A 1.1 cm x 1.2 cm  noncalcified lung nodule versus focal atelectasis is seen within the lateral aspect of the mid right lung. A very small right pleural effusion is seen. Hepatobiliary: No focal liver abnormality is seen. No gallstones, gallbladder wall thickening, or biliary dilatation. Pancreas: Unremarkable. No pancreatic ductal dilatation or surrounding inflammatory changes. Spleen: Normal in size without focal abnormality. Adrenals/Urinary Tract: Adrenal glands are unremarkable. Kidneys are normal, without renal calculi, focal lesion, or hydronephrosis. Bladder is unremarkable. Stomach/Bowel: Stomach is within normal limits. Appendix appears normal. No evidence of bowel dilatation. Mildly thickened small bowel loops are  seen within the posterior aspect of the lower abdomen. Noninflamed diverticula are seen throughout the sigmoid colon. Vascular/Lymphatic: There is mild calcification of the abdominal aorta and bilateral common iliac arteries, without evidence of aneurysmal dilatation. A moderate amount of intraluminal low attenuation is seen within the left common femoral vein (axial CT images 72 through 85, CT series number 2). No enlarged abdominal or pelvic lymph nodes. Reproductive: Status post hysterectomy. No adnexal masses. Other: No abdominal wall hernia or abnormality. No abdominopelvic ascites. Musculoskeletal: There is approximately 2 mm anterolisthesis of the L4 vertebral body on L5, with mild multilevel degenerative changes noted throughout the remainder of the lumbar spine. IMPRESSION: 1. Marked severity pulmonary embolism involving the right pulmonary artery and several of its middle lobe and lower lobe branches. 2. Mild to moderate severity multifocal infiltrates within the right lower lobe, consistent with pneumonia. 3. 1.1 cm x 1.2 cm noncalcified lung nodule versus focal atelectasis within the lateral aspect of the mid right lung. Correlation with follow-up chest CT is recommended to determine stability, as an  underlying neoplastic process cannot be excluded. 4. Moderate amount of DVT within the left common femoral vein. Further evaluation with left lower extremity venous Doppler ultrasound is recommended. 5. Mildly thickened small bowel loops within the lower abdomen which may represent mild enteritis. 6. Sigmoid diverticulosis. Aortic Atherosclerosis (ICD10-I70.0). Electronically Signed: By: Virgina Norfolk M.D. On: 02/28/2020 22:52     Assessment/Plan 74 year old female with history of HTN, basal cell carcinoma, history of tubular adenoma, status post laparoscopic hysterectomy 12/28/2019 on HRT's, who presents to the emergency room with a several day history of right-sided upper and lower quadrant abdominal pain associated with shortness of breath.     Acute pulmonary embolism (HCC)   Acute deep vein thrombosis (DVT) of left femoral vein (HCC) -CTA abdomen and pelvis showing extensive PE but without heart strain as well as left femoral vein thrombosis, with recommendation for follow-up CTA chest -CTA chest ordered by Dr. Karma Greaser for 24 hours after first study per recommendation of radiologist Dr. Sherrye Payor at 2230 on 02/29/2020 -Continue heparin infusion -Echocardiogram to evaluate for right heart strain -Patient will need malignancy on hypercoagulability work-up  Hypercoagulability risk factors from history: -Patient is currently on HRT's, -1.2 cm pulmonary nodule, -Status post laparoscopic hysterectomy on 12/28/2019. -History of tubular adenoma.  Questionable history of melanoma but no documentation to support    RLL pneumonia -Rocephin and azithromycin -Oxygen if needed  Abdominal pain -Enteritis seen on CT, possibly related to pelvic congestion related to femoral vein DVT -No other acute intra-abdominal process but will continue to monitor    Pulmonary nodule 1 cm or greater in diameter -We will need further evaluation to evaluate for malignant process    Essential  hypertension -Continue home antihypertensives.    DVT prophylaxis: Full dose heparin Code Status: full code  Family Communication:  none  Disposition Plan: Back to previous home environment Consults called: none  Status:At the time of admission, it appears that the appropriate admission status for this patient is INPATIENT. This is judged to be reasonable and necessary in order to provide the required intensity of service to ensure the patient's safety given the presenting symptoms, physical exam findings, and initial radiographic and laboratory data in the context of their  Comorbid conditions.   Patient requires inpatient status due to high intensity of service, high risk for further deterioration and high frequency of surveillance required.   I certify that at the point of admission it is  my clinical judgment that the patient will require inpatient hospital care spanning beyond [place] MD [facility]     02/29/2020, 12:37 AM

## 2020-02-29 NOTE — Op Note (Signed)
[facility]  Percutaneous Study/Intervention Procedural Note   Date of Surgery: 02/29/2020,12:56 PM  Surgeon: Leotis Pain  Pre-operative Diagnosis: Symptomatic large right sided pulmonary emboli, left pulmonary arteries not visualized on CT scan of the abdomen pelvis, extensive left lower extremity DVT  Post-operative diagnosis:  Same  Procedure(s) Performed:  1.  Contrast injection right heart  2.  Thrombolysis right pulmonary arteries  3.  Mechanical thrombectomy right main, middle, and lower lobe pulmonary arteries  4.  Selective catheter placement right middle and lower lobe pulmonary arteries  5.  Selective catheter placement left upper and lower lobe pulmonary arteries  6.  IVC filter placement  7.  Left iliofemoral venogram  8.  Mechanical thrombectomy to left common femoral and proximal superficial femoral veins with the penumbra CAT 12 device    Anesthesia: Conscious sedation was administered under my direct supervision by the interventional radiology RN. IV Versed plus fentanyl were utilized. Continuous ECG, pulse oximetry and blood pressure was monitored throughout the entire procedure.  Versed and fentanyl were administered intravenously.  Conscious sedation was administered for a total of 90 minutes using 3 of Versed and 75 mcg of Fentanyl.  EBL: 400 cc  Sheath: 12 Fr left femoral vein  Contrast: 75 cc   Fluoroscopy Time: 23.2 minutes  Indications:  Patient presents with pulmonary emboli. The patient is symptomatic with hypoxemia and dyspnea on exertion.  There is evidence of right heart strain on the CT angiogram. The patient is otherwise a good candidate for intervention and even the long-term benefits pulmonary angiography with thrombolysis is offered. The risks and benefits are reviewed long-term benefits are discussed. All questions are answered patient agrees to proceed.  Procedure:  Ghazal Pevey Safewrightis a 74 y.o. female who was identified  and appropriate procedural time out was performed.  The patient was then placed supine on the table and prepped and draped in the usual sterile fashion.  Ultrasound was used to evaluate the left superficial femoral vein.  This was accessed in the upper thigh with the intention of performing thrombectomy in the proximal superficial femoral vein, common femoral vein, and potentially the distal external iliac vein based off of the CT scan.  A digital ultrasound image was acquired for the permanent record.  A Seldinger needle was used to access the left superficial femoral vein under direct ultrasound guidance.  A 0.035 J wire was advanced without resistance and a 5Fr sheath was placed and then upsized to an 11 [place] sheath.    The wire and pigtail catheter were then negotiated into the right atrium and bolus injection of contrast was utilized to demonstrate the right ventricle and the pulmonary artery outflow. The wire and catheter were then negotiated into the main pulmonary artery where hand injection of contrast was utilized to demonstrate the pulmonary arteries and confirm the locations of the pulmonary emboli.  Image quality was limited due to her tachycardia and respiratory artifact.  I advanced the JR4 catheter first into the left upper lobe where imaging was performed that did not demonstrate a significant pulmonary artery embolus.  I then advanced into the left lower lobe where a small amount of pulmonary embolus appeared present but this did not appear to be significant.  I then negotiated to the right side.  Imaging demonstrated the right upper lobe to be patent.  I did not selectively cannulate the right upper lobe but I did advance of the right middle and lower lobes where selective imaging demonstrated  extensive thrombus from the distal main right pulmonary artery being nearly completely occlusive in both the middle and lower lobe pulmonary arteries.  This was our target for thrombectomy.  3000 Units  of heparin was then given and allowed to circulate.  TPA was reconstituted and delivered onto the table. A total of 8 milligrams of TPA was utilized.  6 mg of TPA was administered on the right side. This was then allowed to dwell. An additional 2 mg of TPA were delivered through the left femoral sheath to address the residual DVT.  The Penumbra Cat 8 catheter was then advanced up into the pulmonary vasculature. The penumbra CAT 12 catheter was initially tried, but due to the tortuosity through the right ventricle this would never track into the pulmonary arteries.  I had to use a select catheter essentially as a dilator for the penumbra CAT 8, 85 cm catheter and was able to get this into the right lung. Catheter was negotiated into the right lower lobe pulmonary artery and mechanical thrombectomy was performed. Follow-up imaging demonstrated a good result and therefore the catheter was renegotiated into the right middle lobe pulmonary artery and again mechanical thrombectomy was performed. Passes were made with both the Penumbra catheter itself as well as introducing the separator in both lobes and the distal main pulmonary artery. Follow-up imaging was then performed.  This demonstrated a small to medium amount of thrombus in the right middle lobe pulmonary artery and only a small amount of thrombus in the right lower lobe pulmonary artery with marked improvement.  I then turned my attention to the IVC and placed a filter as there is going to be residual left lower extremity DVT even with the thrombectomy on the proximal portion of the DVT today.  The sheath was pulled back and the IVC and the renal veins are seen to be at the top of L2/the L1-L2 interspace.  The filter was then deployed at the bottom of L2. I then remove the filter delivery system and place the penumbra CAT 12 device to perform mechanical thrombectomy of the left common femoral and proximal superficial femoral veins.  This was done with  chunks of thrombus removed and suction was maintained on removal of the catheter and the sheath from the left superficial femoral vein.  This was done after removal of the wire.  Pressure was held and sterile dressing was placed.      Findings:   Right heart imaging:  Right atrium and right ventricle and the pulmonary outflow tract appears somewhat dilated  Right lung: Extensive thrombus in the right distal main pulmonary artery, middle lobe pulmonary artery, and lower lobe pulmonary artery with very poor flow beyond the thrombus  Left lung: No extensive thrombus was seen in the left upper lobe and left lower lobe with a small amount of thrombus in the left lower lobe only.  Left leg: Thrombus in the left superficial femoral and common femoral veins extending just into the most distal left external iliac vein.    Disposition: Patient was taken to the recovery room in stable condition having tolerated the procedure well.  Osei Anger 02/29/2020,12:56 PM

## 2020-02-29 NOTE — Progress Notes (Signed)
Patient ID: Patricia Miller, female   DOB: 03/29/1946, 74 y.o.   MRN: [id number] [facility] PROGRESS NOTE  MARRIANA HIBBERD JSE:[id number] DOB: 05-29-1946 DOA: 02/28/2020 PCP: Rusty Aus, MD  HPI/Subjective: Patient seen this morning.  Admitted with abdominal pain.  Patient did have a little shortness of breath.  Some weight loss.  No nausea or vomiting.  Objective: Vitals:   02/29/20 1248 02/29/20 1315  BP: 116/71 127/81  Pulse:  (!) 123  Resp: 14 (!) 35  Temp:    SpO2: 99% 94%    Filed Weights   02/28/20 1915 02/29/20 0250 02/29/20 1111  Weight: 38.6 kg 51.4 kg 49.9 kg    ROS: Review of Systems  Respiratory: Positive for shortness of breath. Negative for cough.   Cardiovascular: Negative for chest pain.  Gastrointestinal: Positive for abdominal pain. Negative for constipation, diarrhea, nausea and vomiting.   Exam: Physical Exam HENT:     Head: Normocephalic.     Nose: No mucosal edema.     Mouth/Throat:     Pharynx: No oropharyngeal exudate.  Eyes:     General: Lids are normal.     Conjunctiva/sclera: Conjunctivae normal.  Cardiovascular:     Rate and Rhythm: Normal rate and regular rhythm.     Heart sounds: Normal heart sounds, S1 normal and S2 normal.  Pulmonary:     Breath sounds: Examination of the right-lower field reveals decreased breath sounds and rhonchi. Examination of the left-lower field reveals decreased breath sounds and rhonchi. Decreased breath sounds and rhonchi present. No wheezing or rales.  Abdominal:     Palpations: Abdomen is soft.     Tenderness: There is no abdominal tenderness.  Musculoskeletal:     Right lower leg: No swelling.     Left lower leg: Swelling present.  Skin:    General: Skin is warm.     Findings: No rash.  Neurological:     Mental Status: She is alert and oriented to person, place, and time.       Data Reviewed: Basic Metabolic Panel: Recent Labs  Lab 02/28/20 1916 02/29/20 0912  NA 132* 132*   K 3.3* 3.1*  CL 92* 97*  CO2 23 22  GLUCOSE 96 86  BUN 14 10  CREATININE 0.92 0.79  CALCIUM 8.9 8.2*   Liver Function Tests: Recent Labs  Lab 02/28/20 1916 02/29/20 0912  AST 15 13*  ALT 9 8  ALKPHOS 67 63  BILITOT 1.3* 0.9  PROT 7.8 7.0  ALBUMIN 3.3* 2.9*   Recent Labs  Lab 02/28/20 1916  LIPASE 23   CBC: Recent Labs  Lab 02/28/20 1916  WBC 12.1*  HGB 12.9  HCT 38.5  MCV 89.3  PLT 342     Recent Results (from the past 240 hour(s))  Urine Culture     Status: None (Preliminary result)   Collection Time: 02/28/20  7:16 PM   Specimen: Urine, Random  Result Value Ref Range Status   Specimen Description   Final    URINE, RANDOM Performed at [facility], [address], [place], [place] [address]    Special Requests   Final    NONE Performed at [facility], [place] [address], [place], [place] [address]    Culture PENDING  Incomplete   Report Status PENDING  Incomplete  SARS Coronavirus 2 by RT PCR (hospital order, performed in [facility] hospital lab) Nasopharyngeal Nasopharyngeal Swab     Status: None   Collection Time:  02/29/20 12:08 AM   Specimen: Nasopharyngeal Swab  Result Value Ref Range Status   SARS Coronavirus 2 NEGATIVE NEGATIVE Final    Comment: (NOTE) SARS-CoV-2 target nucleic acids are NOT DETECTED.  The SARS-CoV-2 RNA is generally detectable in upper and lower respiratory specimens during the acute phase of infection. The lowest concentration of SARS-CoV-2 viral copies this assay can detect is 250 copies / mL. A negative result does not preclude SARS-CoV-2 infection and should not be used as the sole basis for treatment or other patient management decisions.  A negative result may occur with improper specimen collection / handling, submission of specimen other than nasopharyngeal swab, presence of viral mutation(s) within the areas targeted by this assay, and inadequate number of viral copies (<250 copies / mL). A negative  result must be combined with clinical observations, patient history, and epidemiological information.  Fact Sheet for Patients:   StrictlyIdeas.no  Fact Sheet for Healthcare Providers: BankingDealers.co.za  This test is not yet approved or  cleared by the [place] FDA and has been authorized for detection and/or diagnosis of SARS-CoV-2 by FDA under an Emergency Use Authorization (EUA).  This EUA will remain in effect (meaning this test can be used) for the duration of the COVID-19 declaration under Section 564(b)(1) of the Act, 21 U.S.C. section 360bbb-3(b)(1), unless the authorization is terminated or revoked sooner.  Performed at [facility], [address] [place], [place] [address]   Blood culture (routine x 2)     Status: None (Preliminary result)   Collection Time: 02/29/20 12:08 AM   Specimen: BLOOD  Result Value Ref Range Status   Specimen Description BLOOD RIGHT [facility]  Final   Special Requests   Final    BOTTLES DRAWN AEROBIC AND ANAEROBIC Blood Culture adequate volume   Culture   Final    NO GROWTH < 12 HOURS Performed at [facility], [address] [place], [place] [address]    Report Status PENDING  Incomplete  Blood culture (routine x 2)     Status: None (Preliminary result)   Collection Time: 02/29/20 12:08 AM   Specimen: BLOOD  Result Value Ref Range Status   Specimen Description BLOOD LEFT AC  Final   Special Requests   Final    BOTTLES DRAWN AEROBIC AND ANAEROBIC Blood Culture adequate volume   Culture   Final    NO GROWTH < 12 HOURS Performed at [facility], [address] [place], [place] [address]    Report Status PENDING  Incomplete     Studies: CT ABDOMEN PELVIS W CONTRAST  Addendum Date: 02/28/2020   ADDENDUM REPORT: 02/28/2020 23:05 ADDENDUM: Results were discussed with Dr. Ellender Hose at 10:56 p.m. [place] on February 28, 2020. Electronically Signed   By: Virgina Norfolk M.D.   On: 02/28/2020 23:05   Result Date: 02/28/2020 CLINICAL DATA:  Right lower quadrant pain. EXAM: CT ABDOMEN AND PELVIS WITH CONTRAST TECHNIQUE: Multidetector CT imaging of the abdomen and pelvis was performed using the standard protocol following bolus administration of intravenous contrast. CONTRAST:  57mL OMNIPAQUE IOHEXOL 300 MG/ML  SOLN COMPARISON:  None. FINDINGS: Lower chest: Marked severity intraluminal low attenuation is seen within the right pulmonary artery and several of its middle lobe and lower lobe branches. Mild to moderate severity multifocal infiltrates are seen within the right lower lobe. A 1.1 cm x 1.2 cm noncalcified lung nodule versus focal atelectasis is seen within the lateral aspect of the mid right lung. A very small  right pleural effusion is seen. Hepatobiliary: No focal liver abnormality is seen. No gallstones, gallbladder wall thickening, or biliary dilatation. Pancreas: Unremarkable. No pancreatic ductal dilatation or surrounding inflammatory changes. Spleen: Normal in size without focal abnormality. Adrenals/Urinary Tract: Adrenal glands are unremarkable. Kidneys are normal, without renal calculi, focal lesion, or hydronephrosis. Bladder is unremarkable. Stomach/Bowel: Stomach is within normal limits. Appendix appears normal. No evidence of bowel dilatation. Mildly thickened small bowel loops are seen within the posterior aspect of the lower abdomen. Noninflamed diverticula are seen throughout the sigmoid colon. Vascular/Lymphatic: There is mild calcification of the abdominal aorta and bilateral common iliac arteries, without evidence of aneurysmal dilatation. A moderate amount of intraluminal low attenuation is seen within the left common femoral vein (axial CT images 72 through 85, CT series number 2). No enlarged abdominal or pelvic lymph nodes. Reproductive: Status post hysterectomy. No adnexal masses. Other: No abdominal wall hernia or abnormality. No  abdominopelvic ascites. Musculoskeletal: There is approximately 2 mm anterolisthesis of the L4 vertebral body on L5, with mild multilevel degenerative changes noted throughout the remainder of the lumbar spine. IMPRESSION: 1. Marked severity pulmonary embolism involving the right pulmonary artery and several of its middle lobe and lower lobe branches. 2. Mild to moderate severity multifocal infiltrates within the right lower lobe, consistent with pneumonia. 3. 1.1 cm x 1.2 cm noncalcified lung nodule versus focal atelectasis within the lateral aspect of the mid right lung. Correlation with follow-up chest CT is recommended to determine stability, as an underlying neoplastic process cannot be excluded. 4. Moderate amount of DVT within the left common femoral vein. Further evaluation with left lower extremity venous Doppler ultrasound is recommended. 5. Mildly thickened small bowel loops within the lower abdomen which may represent mild enteritis. 6. Sigmoid diverticulosis. Aortic Atherosclerosis (ICD10-I70.0). Electronically Signed: By: Virgina Norfolk M.D. On: 02/28/2020 22:52   PERIPHERAL VASCULAR CATHETERIZATION  Result Date: 02/29/2020 See op note  US Venous Img Lower Bilateral  Result Date: 02/29/2020 CLINICAL DATA:  Left femoral DVT on recent CT examination. EXAM: BILATERAL LOWER EXTREMITY VENOUS DOPPLER ULTRASOUND TECHNIQUE: Gray-scale sonography with graded compression, as well as color Doppler and duplex ultrasound were performed to evaluate the lower extremity deep venous systems from the level of the common femoral vein and including the common femoral, femoral, profunda femoral, popliteal and calf veins including the posterior tibial, peroneal and gastrocnemius veins when visible. The superficial great saphenous vein was also interrogated. Spectral Doppler was utilized to evaluate flow at rest and with distal augmentation maneuvers in the common femoral, femoral and popliteal veins. COMPARISON:   None. FINDINGS: RIGHT LOWER EXTREMITY Common Femoral Vein: No evidence of thrombus. Normal compressibility, respiratory phasicity and response to augmentation. Saphenofemoral Junction: No evidence of thrombus. Normal compressibility and flow on color Doppler imaging. Profunda Femoral Vein: No evidence of thrombus. Normal compressibility and flow on color Doppler imaging. Femoral Vein: No evidence of thrombus. Normal compressibility, respiratory phasicity and response to augmentation. Popliteal Vein: No evidence of thrombus. Normal compressibility, respiratory phasicity and response to augmentation. Calf Veins: No evidence of thrombus. Normal compressibility and flow on color Doppler imaging. Superficial Great Saphenous Vein: No evidence of thrombus. Normal compressibility. Venous Reflux:  None. Other Findings:  None. LEFT LOWER EXTREMITY Common Femoral Vein: Thrombus is noted with decreased compressibility. Saphenofemoral Junction: Thrombus is noted with decreased compressibility. Profunda Femoral Vein: No evidence of thrombus. Normal compressibility and flow on color Doppler imaging. Femoral Vein: Thrombus is noted with decreased compressibility. Popliteal Vein: Thrombus is noted  with decreased compressibility. Calf Veins: Thrombus is noted in the peroneal vein. The posterior tibial vein is within normal limits. Superficial Great Saphenous Vein: No evidence of thrombus. Normal compressibility. Venous Reflux:  None. Other Findings:  None. IMPRESSION: No evidence of deep venous thrombosis in the right lower extremity. Extensive deep venous thrombosis in the left lower extremity as described. Electronically Signed   By: Inez Catalina M.D.   On: 02/29/2020 00:56    Scheduled Meds: . [START ON 03/01/2020] feeding supplement (ENSURE ENLIVE)  237 mL Oral BID BM  . fentaNYL      . heparin sodium (porcine)      . midazolam      . [START ON 03/01/2020] multivitamin with minerals  1 tablet Oral Daily  . [START ON  03/01/2020] pneumococcal 23 valent vaccine  0.5 mL Intramuscular Tomorrow-1000  . potassium chloride  40 mEq Oral Once  . [MAR Hold] sodium chloride flush  10-40 mL Intracatheter Q12H   Continuous Infusions: . sodium chloride 50 mL/hr at 02/29/20 0335  . [MAR Hold] azithromycin Stopped (02/29/20 0109)  . [MAR Hold] cefTRIAXone (ROCEPHIN)  IV Stopped (02/29/20 0051)  . heparin 650 Units/hr (02/29/20 1324)    Assessment/Plan:  1. Acute pulmonary emboli seen on CT scan of the abdomen.  Patient also had extensive DVT of the left lower extremity.  Case discussed with vascular surgery this morning and they took for thrombolysis of the right pulmonary artery and mechanical thrombectomy of the right main, middle and lower pulmonary arteries.  Patient also had IVC filter placement and mechanical thrombectomy to the left common femoral and proximal superficial femoral veins.  Patient on IV heparin. 2. Hypokalemia replace potassium orally 3. Essential hypertension hold hydrochlorothiazide with hypokalemia 4. Right lobar pneumonia seen on CT scan.  Could be pulmonary infarct.  Empiric antibiotics with Rocephin and Zithromax 5. Anxiety restart Wellbutrin 6. Pulmonary nodule greater than 1 cm.  This will need close clinical follow-up as outpatient.        Code Status:     Code Status Orders  (From admission, onward)         Start     Ordered   02/29/20 0035  Full code  Continuous        02/29/20 0036        Code Status History    Date Active Date Inactive Code Status Order ID Comments User Context   12/28/2019 0627 12/28/2019 1733 Full Code [id number]  Benjaman Kindler, MD Inpatient   Advance Care Planning Activity     Family Communication: Spoke with Langley Gauss on the phone Disposition Plan: Status is: Inpatient  Dispo: The patient is from: Home              Anticipated d/c is to: Home              Anticipated d/c date is: Likely will need a couple days in the hospital on  anticoagulation              Patient currently being treated for acute pulmonary embolism  Antibiotics:  Rocephin  Zithromax  Time spent: 35 minutes  [place]

## 2020-02-29 NOTE — Progress Notes (Signed)
CODE SEPSIS - PHARMACY COMMUNICATION  **Broad Spectrum Antibiotics should be administered within 1 hour of Sepsis diagnosis**  Time Code Sepsis Called/Page Received: 2329  Antibiotics Ordered: Zithromax and Rocephin  Time of 1st antibiotic administration: 0003  Additional action taken by pharmacy: n/a  If necessary, Name of Provider/Nurse Contacted: n/a    Ena Dawley ,PharmD Clinical Pharmacist  02/29/2020  1:11 AM

## 2020-02-29 NOTE — Progress Notes (Signed)
Initial Nutrition Assessment  DOCUMENTATION CODES:   Not applicable  INTERVENTION:   Ensure Enlive po BID, each supplement provides 350 kcal and 20 grams of protein  MVI daily   NUTRITION DIAGNOSIS:   Increased nutrient needs related to acute illness (PNA) as evidenced by increased estimated needs.  GOAL:   Patient will meet greater than or equal to 90% of their needs  MONITOR:   PO intake, Supplement acceptance, Labs, Weight trends, Skin, I & O's  REASON FOR ASSESSMENT:   Malnutrition Screening Tool    ASSESSMENT:   74 y.o. female with medical history significant for HTN, basal cell carcinoma, history of tubular adenoma, status post laparoscopic hysterectomy 12/28/2019 on HRT's who presents with PE, DVT and PNA   Unable to see pt today as pt in procedure at time of RD visit. Per chart, pt is down 7lbs(6%) over the past 2 months. RD will add supplements and MVI to help pt meet her estimated needs. RD will obtain nutrition related exam and history at follow-up.   Medications reviewed and include: fentanyl, heparin, NaCl @50ml /hr, azithromycin   Labs reviewed: Na 132(L), K 3.1(L) Wbc- 12.1(H)  NUTRITION - FOCUSED PHYSICAL EXAM: Unable to perform at this time   Diet Order:   Diet Order            Diet NPO time specified  Diet effective now                EDUCATION NEEDS:   No education needs have been identified at this time  Skin:  Skin Assessment: Reviewed RN Assessment  Last BM:  9/19  Height:   Ht Readings from Last 1 Encounters:  02/29/20 5' (1.524 m)    Weight:   Wt Readings from Last 1 Encounters:  02/29/20 49.9 kg    Ideal Body Weight:  45.45 kg  BMI:  Body mass index is 21.48 kg/m.  Estimated Nutritional Needs:   Kcal:  1200-1400kcal/day  Protein:  60-70g/day  Fluid:  1.2-1.4L/day  Koleen Distance MS, RD, LDN Please refer to [facility] for RD and/or RD on-call/weekend/after hours pager

## 2020-02-29 NOTE — Progress Notes (Signed)
ANTICOAGULATION CONSULT NOTE - Initial Consult  Pharmacy Consult for Heparin  Indication: pulmonary embolus  No Known Allergies  Patient Measurements: Height: 5' (152.4 cm) Weight: 38.6 kg (85 lb) IBW/kg (Calculated) : 45.5  Vital Signs: Temp: 98.5 F (36.9 C) (09/20 0250) Temp Source: Oral (09/20 0250) BP: 144/91 (09/20 0250) Pulse Rate: 113 (09/20 0250)  Labs: Recent Labs    02/28/20 1916 02/29/20 0008  HGB 12.9  --   HCT 38.5  --   PLT 342  --   APTT  --  29  LABPROT  --  13.4  INR  --  1.1  CREATININE 0.92  --     Estimated Creatinine Clearance: 31.7 mL/min (by C-G formula based on SCr of 0.92 mg/dL).  Medical History: Past Medical History:  Diagnosis Date  . Anxiety   . Arthritis    NECK  . Cancer ([place])    BASAL CELL AND MELANOMA  . DAVF (dural arteriovenous fistula) 2015  . Hypertension     Medications:  Medications Prior to Admission  Medication Sig Dispense Refill Last Dose  . buPROPion (WELLBUTRIN XL) 150 MG 24 hr tablet Take 150 mg by mouth every morning.   6 Past Week at Unknown time  . Calcium Carbonate-Vitamin D (OS-CAL 500 + D PO) Take 1 tablet by mouth daily.    Past Week at Unknown time  . cetirizine (ZYRTEC) 10 MG tablet Take 10 mg by mouth daily as needed for allergies.    Past Week at Unknown time  . Cholecalciferol (DIALYVITE VITAMIN D 5000) 125 MCG (5000 UT) capsule Take 5,000 Units by mouth daily.   Past Week at Unknown time  . estrogen, conjugated,-medroxyprogesterone (PREMPRO) 0.625-5 MG tablet Take 1 tablet by mouth every evening.    Past Week at Unknown time  . hydrochlorothiazide (HYDRODIURIL) 25 MG tablet Take 25 mg by mouth every evening.    Past Week at Unknown time  . Lutein 40 MG CAPS Take 40 mg by mouth daily.   Past Week at Unknown time  . vitamin B-12 (CYANOCOBALAMIN) 1000 MCG tablet Take 1,000 mcg by mouth daily.    Past Week at Unknown time  . docusate sodium (COLACE) 100 MG capsule Take 1 capsule (100 mg total) by mouth 2  (two) times daily. To keep stools soft (Patient not taking: Reported on 02/29/2020) 30 capsule 0 Completed Course at Unknown time  . oxyCODONE (OXY IR/ROXICODONE) 5 MG immediate release tablet Take 1 tablet (5 mg total) by mouth every 4 (four) hours as needed for severe pain. (Patient not taking: Reported on 02/29/2020) 20 tablet 0 Completed Course at Unknown time    Assessment: Heparin for DVT/PE.  No anticoagulants PTA.  Baseline labs ordered.   Goal of Therapy:  Heparin level 0.3-0.7 units/ml Monitor platelets by anticoagulation protocol: Yes   Plan:  Heparin 2700 units bolus x 1 then infusion at 650 units/hr Check HL ~ 8 hours after heparin started  Hart Robinsons A 02/29/2020,3:05 AM

## 2020-02-29 NOTE — Consult Note (Signed)
Pharmacy Antibiotic Note  ALISON KUBICKI is a 74 y.o. female admitted on 02/28/2020 with bacteremia.  Pharmacy has been consulted for Vancomycin dosing.  Plan: Vancomycin 1250 mg x1 dose, followed by 1000 mg Q24H   Height: 5' (152.4 cm) Weight: 49.9 kg (110 lb) IBW/kg (Calculated) : 45.5  Temp (24hrs), Avg:98.3 F (36.8 C), Min:97.8 F (36.6 C), Max:98.5 F (36.9 C)  Recent Labs  Lab 02/28/20 0008 02/28/20 1916 02/29/20 0912  WBC  --  12.1*  --   CREATININE  --  0.92 0.79  LATICACIDVEN 1.6  --   --     Estimated Creatinine Clearance: 43 mL/min (by C-G formula based on SCr of 0.79 mg/dL).    No Known Allergies  Antimicrobials this admission:   Dose adjustments this admission:   Microbiology results: BCx:  UCx:   Sputum:    MRSA PCR:   Thank you for allowing pharmacy to be a part of this patient's care.  Rowland Lathe 02/29/2020 6:03 PM

## 2020-02-29 NOTE — Progress Notes (Signed)
Patient returned from specials s/p thrombectomy and IVC in L groin. Dressing D/I, no hematoma noted. Leg elevated as per orders. Pain meds given per request.  No other complaints at this time.

## 2020-02-29 NOTE — Consult Note (Signed)
[facility] Vascular Consult Note  MRN : [id number]  Patricia Miller is a 74 y.o. (January 26, 1946) female who presents with chief complaint of  Chief Complaint  Patient presents with  . Abdominal Pain   History of Present Illness:  Patricia Miller is a 74 year old female with medical history significant for HTN, basal cell carcinoma, history of tubular adenoma, status post laparoscopic hysterectomy 12/28/2019 on HRT's, who presents to the emergency room with a several day history of dull achy right upper and lower abdominal pain, nonradiating, of moderate to severe intensity now associated with shortness of breath.  Pain is worse on taking a deep breath and on moving around.  She denies chest pain, denies cough, fever or chills.   CT A/P (02/28/20): 1. Marked severity pulmonary embolism involving the right pulmonary artery and several of its middle lobe and lower lobe branches. 2. Mild to moderate severity multifocal infiltrates within the right lower lobe, consistent with pneumonia. 3. 1.1 cm x 1.2 cm noncalcified lung nodule versus focal atelectasis within the lateral aspect of the mid right lung. Correlation with follow-up chest CT is recommended to determine stability, as an underlying neoplastic process cannot be excluded. 4. Moderate amount of DVT within the left common femoral vein. Further evaluation with left lower extremity venous Doppler ultrasound is recommended. 5. Mildly thickened small bowel loops within the lower abdomen which may represent mild enteritis. 6. Sigmoid diverticulosis.  Vascular surgery was consulted by Dr. Leslye Peer for possible endovascular intervention in regard to the patient's PE and DVT.   Current Facility-Administered Medications  Medication Dose Route Frequency Provider Last Rate Last Admin  . 0.9 %  sodium chloride infusion   Intravenous Continuous Athena Masse, MD 50 mL/hr at 02/29/20 0335 New Bag at 02/29/20 0335  .  azithromycin (ZITHROMAX) 500 mg in sodium chloride 0.9 % 250 mL IVPB  500 mg Intravenous Q24H Athena Masse, MD   Stopped at 02/29/20 0109  . cefTRIAXone (ROCEPHIN) 2 g in sodium chloride 0.9 % 100 mL IVPB  2 g Intravenous Q24H Athena Masse, MD   Stopped at 02/29/20 0051  . fentaNYL (SUBLIMAZE) injection 50 mcg  50 mcg Intravenous Q30 min PRN Hinda Kehr, MD   50 mcg at 02/28/20 2222  . heparin ADULT infusion 100 units/mL (25000 units/26mL sodium chloride 0.45%)  650 Units/hr Intravenous Continuous Athena Masse, MD 6.5 mL/hr at 02/29/20 0335 650 Units/hr at 02/29/20 0335  . oxyCODONE (Oxy IR/ROXICODONE) immediate release tablet 5 mg  5 mg Oral Q6H PRN Lang Snow, NP   5 mg at 02/29/20 0513  . [START ON 03/01/2020] pneumococcal 23 valent vaccine (PNEUMOVAX-23) injection 0.5 mL  0.5 mL Intramuscular Tomorrow-1000 Judd Gaudier V, MD      . sodium chloride flush (NS) 0.9 % injection 10-40 mL  10-40 mL Intracatheter Q12H Judd Gaudier V, MD      . sodium chloride flush (NS) 0.9 % injection 10-40 mL  10-40 mL Intracatheter PRN Athena Masse, MD       Past Medical History:  Diagnosis Date  . Anxiety   . Arthritis    NECK  . Cancer ([place])    BASAL CELL AND MELANOMA  . DAVF (dural arteriovenous fistula) 2015  . Hypertension    Past Surgical History:  Procedure Laterality Date  . BRAIN SURGERY  2015   DURAL AV FISTULA ([facility])  . COLONOSCOPY    . COLONOSCOPY WITH PROPOFOL N/A 02/20/2017  Procedure: COLONOSCOPY WITH PROPOFOL;  Surgeon: Manya Silvas, MD;  Location: [facility];  Service: Endoscopy;  Laterality: N/A;  . HYSTEROSCOPY WITH D & C N/A 07/29/2015   Procedure: DILATATION AND CURETTAGE /HYSTEROSCOPY;  Surgeon: Benjaman Kindler, MD;  Location: [facility] ORS;  Service: Gynecology;  Laterality: N/A;  . HYSTEROSCOPY WITH D & C N/A 01/24/2018   Procedure: DILATATION AND CURETTAGE /HYSTEROSCOPY;  Surgeon: Benjaman Kindler, MD;  Location: [facility] ORS;  Service:  Gynecology;  Laterality: N/A;  . LAPAROSCOPY N/A 01/24/2018   Procedure: LAPAROSCOPY DIAGNOSTIC;  Surgeon: Benjaman Kindler, MD;  Location: [facility] ORS;  Service: Gynecology;  Laterality: N/A;  . TONSILLECTOMY     AGE 28  . TOTAL LAPAROSCOPIC HYSTERECTOMY WITH BILATERAL SALPINGO OOPHORECTOMY Bilateral 12/28/2019   Procedure: TOTAL LAPAROSCOPIC HYSTERECTOMY WITH BILATERAL SALPINGO OOPHORECTOMY;  Surgeon: Benjaman Kindler, MD;  Location: [facility] ORS;  Service: Gynecology;  Laterality: Bilateral;  . TUBAL LIGATION     Social History Social History   Tobacco Use  . Smoking status: Former Smoker    Packs/day: 0.50    Years: 15.00    Pack years: 7.50    Types: Cigarettes    Quit date: 07/21/2000    Years since quitting: 19.6  . Smokeless tobacco: Never Used  Vaping Use  . Vaping Use: Never used  Substance Use Topics  . Alcohol use: Yes    Comment: RARE WINE MONTHLY  . Drug use: No   Family History History reviewed. No pertinent family history.  Denies family history of peripheral artery disease, venous disease or renal disease.  No Known Allergies  REVIEW OF SYSTEMS (Negative unless checked)  Constitutional: [] Weight loss  [] Fever  [] Chills Cardiac: [x] Chest pain   [x] Chest pressure   [] Palpitations   [x] Shortness of breath when laying flat   [x] Shortness of breath at rest   [x] Shortness of breath with exertion. Vascular:  [] Pain in legs with walking   [] Pain in legs at rest   [] Pain in legs when laying flat   [] Claudication   [] Pain in feet when walking  [] Pain in feet at rest  [] Pain in feet when laying flat   [] History of DVT   [] Phlebitis   [] Swelling in legs   [] Varicose veins   [] Non-healing ulcers Pulmonary:   [] Uses home oxygen   [] Productive cough   [] Hemoptysis   [] Wheeze  [] COPD   [] Asthma Neurologic:  [] Dizziness  [] Blackouts   [] Seizures   [] History of stroke   [] History of TIA  [] Aphasia   [] Temporary blindness   [] Dysphagia   [] Weakness or numbness in arms   [] Weakness or  numbness in legs Musculoskeletal:  [] Arthritis   [] Joint swelling   [] Joint pain   [] Low back pain Hematologic:  [] Easy bruising  [] Easy bleeding   [] Hypercoagulable state   [] Anemic  [] Hepatitis Gastrointestinal:  [] Blood in stool   [] Vomiting blood  [] Gastroesophageal reflux/heartburn   [] Difficulty swallowing. Genitourinary:  [] Chronic kidney disease   [] Difficult urination  [] Frequent urination  [] Burning with urination   [] Blood in urine Skin:  [] Rashes   [] Ulcers   [] Wounds Psychological:  [] History of anxiety   []  History of major depression.  Physical Examination  Vitals:   02/28/20 1915 02/29/20 0115 02/29/20 0250 02/29/20 0804  BP:  125/84 (!) 144/91 129/68  Pulse:  (!) 106 (!) 113 (!) 108  Resp:  (!) 28 17 19   Temp:   98.5 F (36.9 C) 98.5 F (36.9 C)  TempSrc:   Oral Oral  SpO2:  98% 100% 96%  Weight: 38.6 kg  51.4 kg   Height: 5' (1.524 m)  5' (1.524 m)    Body mass index is 22.15 kg/m. Gen:  WD/WN, NAD Head: [place]/AT, No temporalis wasting. Prominent temp pulse not noted. Ear/Nose/Throat: Hearing grossly intact, nares w/o erythema or drainage, oropharynx w/o Erythema/Exudate Eyes: Sclera non-icteric, conjunctiva clear Neck: Trachea midline.  No JVD.  Pulmonary:  Good air movement, respirations not labored in bed, equal bilaterally.  Cardiac: RRR, normal S1, S2. Vascular:  Vessel Right Left  Radial Palpable Palpable  Ulnar Palpable Palpable  Brachial Palpable Palpable  Carotid Palpable, without bruit Palpable, without bruit  Aorta Not palpable N/A  Femoral Palpable Palpable  Popliteal Palpable Palpable  PT Palpable Palpable  DP Palpable Palpable   Left lower extremity: Thigh soft.  Calf soft.  Extremities warm distally to toes.  Minimal edema.  There is no acute vascular compromise noted to extremity at this time.  Gastrointestinal: soft, non-tender/non-distended. No guarding/reflex.  Musculoskeletal: M/S 5/5 throughout.  Extremities without ischemic changes.   No deformity or atrophy. No edema. Neurologic: Sensation grossly intact in extremities.  Symmetrical.  Speech is fluent. Motor exam as listed above. Psychiatric: Judgment intact, Mood & affect appropriate for pt's clinical situation. Dermatologic: No rashes or ulcers noted.  No cellulitis or open wounds. Lymph : No Cervical, Axillary, or Inguinal lymphadenopathy.  CBC Lab Results  Component Value Date   WBC 12.1 (H) 02/28/2020   HGB 12.9 02/28/2020   HCT 38.5 02/28/2020   MCV 89.3 02/28/2020   PLT 342 02/28/2020   BMET    Component Value Date/Time   NA 132 (L) 02/29/2020 0912   K 3.1 (L) 02/29/2020 0912   CL 97 (L) 02/29/2020 0912   CO2 22 02/29/2020 0912   GLUCOSE 86 02/29/2020 0912   BUN 10 02/29/2020 0912   CREATININE 0.79 02/29/2020 0912   CALCIUM 8.2 (L) 02/29/2020 0912   GFRNONAA >60 02/29/2020 0912   GFRAA >60 02/29/2020 0912   Estimated Creatinine Clearance: 43 mL/min (by C-G formula based on SCr of 0.79 mg/dL).  COAG Lab Results  Component Value Date   INR 1.1 02/29/2020   Radiology CT ABDOMEN PELVIS W CONTRAST  Addendum Date: 02/28/2020   ADDENDUM REPORT: 02/28/2020 23:05 ADDENDUM: Results were discussed with Dr. Ellender Hose at 10:56 p.m. [place] on February 28, 2020. Electronically Signed   By: Virgina Norfolk M.D.   On: 02/28/2020 23:05   Result Date: 02/28/2020 CLINICAL DATA:  Right lower quadrant pain. EXAM: CT ABDOMEN AND PELVIS WITH CONTRAST TECHNIQUE: Multidetector CT imaging of the abdomen and pelvis was performed using the standard protocol following bolus administration of intravenous contrast. CONTRAST:  24mL OMNIPAQUE IOHEXOL 300 MG/ML  SOLN COMPARISON:  None. FINDINGS: Lower chest: Marked severity intraluminal low attenuation is seen within the right pulmonary artery and several of its middle lobe and lower lobe branches. Mild to moderate severity multifocal infiltrates are seen within the right lower lobe. A 1.1 cm x 1.2 cm noncalcified lung nodule  versus focal atelectasis is seen within the lateral aspect of the mid right lung. A very small right pleural effusion is seen. Hepatobiliary: No focal liver abnormality is seen. No gallstones, gallbladder wall thickening, or biliary dilatation. Pancreas: Unremarkable. No pancreatic ductal dilatation or surrounding inflammatory changes. Spleen: Normal in size without focal abnormality. Adrenals/Urinary Tract: Adrenal glands are unremarkable. Kidneys are normal, without renal calculi, focal lesion, or hydronephrosis. Bladder is unremarkable. Stomach/Bowel: Stomach is within normal limits. Appendix appears normal. No evidence of bowel  dilatation. Mildly thickened small bowel loops are seen within the posterior aspect of the lower abdomen. Noninflamed diverticula are seen throughout the sigmoid colon. Vascular/Lymphatic: There is mild calcification of the abdominal aorta and bilateral common iliac arteries, without evidence of aneurysmal dilatation. A moderate amount of intraluminal low attenuation is seen within the left common femoral vein (axial CT images 72 through 85, CT series number 2). No enlarged abdominal or pelvic lymph nodes. Reproductive: Status post hysterectomy. No adnexal masses. Other: No abdominal wall hernia or abnormality. No abdominopelvic ascites. Musculoskeletal: There is approximately 2 mm anterolisthesis of the L4 vertebral body on L5, with mild multilevel degenerative changes noted throughout the remainder of the lumbar spine. IMPRESSION: 1. Marked severity pulmonary embolism involving the right pulmonary artery and several of its middle lobe and lower lobe branches. 2. Mild to moderate severity multifocal infiltrates within the right lower lobe, consistent with pneumonia. 3. 1.1 cm x 1.2 cm noncalcified lung nodule versus focal atelectasis within the lateral aspect of the mid right lung. Correlation with follow-up chest CT is recommended to determine stability, as an underlying neoplastic  process cannot be excluded. 4. Moderate amount of DVT within the left common femoral vein. Further evaluation with left lower extremity venous Doppler ultrasound is recommended. 5. Mildly thickened small bowel loops within the lower abdomen which may represent mild enteritis. 6. Sigmoid diverticulosis. Aortic Atherosclerosis (ICD10-I70.0). Electronically Signed: By: Virgina Norfolk M.D. On: 02/28/2020 22:52   US Venous Img Lower Bilateral  Result Date: 02/29/2020 CLINICAL DATA:  Left femoral DVT on recent CT examination. EXAM: BILATERAL LOWER EXTREMITY VENOUS DOPPLER ULTRASOUND TECHNIQUE: Gray-scale sonography with graded compression, as well as color Doppler and duplex ultrasound were performed to evaluate the lower extremity deep venous systems from the level of the common femoral vein and including the common femoral, femoral, profunda femoral, popliteal and calf veins including the posterior tibial, peroneal and gastrocnemius veins when visible. The superficial great saphenous vein was also interrogated. Spectral Doppler was utilized to evaluate flow at rest and with distal augmentation maneuvers in the common femoral, femoral and popliteal veins. COMPARISON:  None. FINDINGS: RIGHT LOWER EXTREMITY Common Femoral Vein: No evidence of thrombus. Normal compressibility, respiratory phasicity and response to augmentation. Saphenofemoral Junction: No evidence of thrombus. Normal compressibility and flow on color Doppler imaging. Profunda Femoral Vein: No evidence of thrombus. Normal compressibility and flow on color Doppler imaging. Femoral Vein: No evidence of thrombus. Normal compressibility, respiratory phasicity and response to augmentation. Popliteal Vein: No evidence of thrombus. Normal compressibility, respiratory phasicity and response to augmentation. Calf Veins: No evidence of thrombus. Normal compressibility and flow on color Doppler imaging. Superficial Great Saphenous Vein: No evidence of thrombus.  Normal compressibility. Venous Reflux:  None. Other Findings:  None. LEFT LOWER EXTREMITY Common Femoral Vein: Thrombus is noted with decreased compressibility. Saphenofemoral Junction: Thrombus is noted with decreased compressibility. Profunda Femoral Vein: No evidence of thrombus. Normal compressibility and flow on color Doppler imaging. Femoral Vein: Thrombus is noted with decreased compressibility. Popliteal Vein: Thrombus is noted with decreased compressibility. Calf Veins: Thrombus is noted in the peroneal vein. The posterior tibial vein is within normal limits. Superficial Great Saphenous Vein: No evidence of thrombus. Normal compressibility. Venous Reflux:  None. Other Findings:  None. IMPRESSION: No evidence of deep venous thrombosis in the right lower extremity. Extensive deep venous thrombosis in the left lower extremity as described. Electronically Signed   By: Inez Catalina M.D.   On: 02/29/2020 00:56   Assessment/Plan Patricia Miller  is a 74 year old female with medical history significant for HTN, basal cell carcinoma, history of tubular adenoma, status post laparoscopic hysterectomy 12/28/2019 on HRT's, who presents to the emergency room with a several day history of dull achy right upper and lower abdominal pain, nonradiating, of moderate to severe intensity now associated with shortness of breath.  Found to have PE and extensive left lower extremity DVT.  1. PE: Presents with progressively worsening shortness of breath and chest pain with inspiration.  Patient underwent CT of the abdomen pelvis where right pulmonary artery / right lobe middle and lower lobe PE was noted.  Patient initiated on heparin.  Patient with continued discomfort on expiration.  Unable to undergo CTA chest due to exposure to dye during CT abdomen pelvis yesterday.  Patient with large clot burden, recommend undergoing a pulmonary angiography to assess the remainder of the lung for PE.  We will plan to treat right  pulmonary artery/middle/lower lobe.  If appropriate, an additional clot burden is noted we will treat that as well.  Procedure, risks and benefits were explained to the patient.  All questions were answered.  The patient wishes to proceed.  2. DVT: Patient with extensive left lower extremity DVT.  An attempt to improve the patient's symptoms, decrease clot burden and decrease the risk of possible postphlebitic complications recommend undergoing a left lower extremity pulmonary thrombectomy/pulmonary lysis.  Procedure, risk and benefits were explained to the patient.  All questions answered.  Patient was to proceed.  Possible IVC filter insertion based on findings of venous angiogram.    3.  Anticoagulation: Patient will need to be on oral anticoagulation for at least a year due to diagnosis of pulmonary embolism.    Discussed with Dr. Mayme Genta, PA-C  02/29/2020 10:27 AM  This note was created with Dragon medical transcription system.  Any error is purely unintentional

## 2020-02-29 NOTE — Progress Notes (Signed)
PHARMACY - PHYSICIAN COMMUNICATION CRITICAL VALUE ALERT - BLOOD CULTURE IDENTIFICATION (BCID)  Patricia Miller is an 74 y.o. female who presented to [facility] on 02/28/2020 with a chief complaint of DVT and RLL PNA.  Assessment: BCx 2 of 4 bottles (anaerobic and aerobic; 2 different sets); GPC, Staphylococcus species. Patient is afebrile. WBC 12.1. Procalcitonin pending.   Name of physician (or Provider) Contacted: Dr. Leslye Peer   Current antibiotics: Ceftriaxone and Azithromycin  Changes to prescribed antibiotics recommended: D/c CTX and start Vancomycin Recommendations accepted by provider  Results for orders placed or performed during the hospital encounter of 02/28/20  Blood Culture ID Panel (Reflexed) (Collected: 02/29/2020 12:08 AM)  Result Value Ref Range   Enterococcus faecalis NOT DETECTED NOT DETECTED   Enterococcus Faecium NOT DETECTED NOT DETECTED   Listeria monocytogenes NOT DETECTED NOT DETECTED   Staphylococcus species DETECTED (A) NOT DETECTED   Staphylococcus aureus (BCID) NOT DETECTED NOT DETECTED   Staphylococcus epidermidis NOT DETECTED NOT DETECTED   Staphylococcus lugdunensis NOT DETECTED NOT DETECTED   Streptococcus species NOT DETECTED NOT DETECTED   Streptococcus agalactiae NOT DETECTED NOT DETECTED   Streptococcus pneumoniae NOT DETECTED NOT DETECTED   Streptococcus pyogenes NOT DETECTED NOT DETECTED   A.calcoaceticus-baumannii NOT DETECTED NOT DETECTED   Bacteroides fragilis NOT DETECTED NOT DETECTED   Enterobacterales NOT DETECTED NOT DETECTED   Enterobacter cloacae complex NOT DETECTED NOT DETECTED   Escherichia coli NOT DETECTED NOT DETECTED   Klebsiella aerogenes NOT DETECTED NOT DETECTED   Klebsiella oxytoca NOT DETECTED NOT DETECTED   Klebsiella pneumoniae NOT DETECTED NOT DETECTED   Proteus species NOT DETECTED NOT DETECTED   Salmonella species NOT DETECTED NOT DETECTED   Serratia marcescens NOT DETECTED NOT DETECTED   Haemophilus influenzae  NOT DETECTED NOT DETECTED   Neisseria meningitidis NOT DETECTED NOT DETECTED   Pseudomonas aeruginosa NOT DETECTED NOT DETECTED   Stenotrophomonas maltophilia NOT DETECTED NOT DETECTED   Candida albicans NOT DETECTED NOT DETECTED   Candida auris NOT DETECTED NOT DETECTED   Candida glabrata NOT DETECTED NOT DETECTED   Candida krusei NOT DETECTED NOT DETECTED   Candida parapsilosis NOT DETECTED NOT DETECTED   Candida tropicalis NOT DETECTED NOT DETECTED   Cryptococcus neoformans/gattii NOT DETECTED NOT DETECTED    Rowland Lathe 02/29/2020  5:09 PM

## 2020-03-01 ENCOUNTER — Inpatient Hospital Stay
Admit: 2020-03-01 | Discharge: 2020-03-01 | Disposition: A | Discharge status: Home / Self Care | Payer: PPO | Attending: Vascular Surgery | Admitting: Vascular Surgery

## 2020-03-01 ENCOUNTER — Encounter: Payer: Self-pay | Admitting: Vascular Surgery

## 2020-03-01 DIAGNOSIS — I82412 Acute embolism and thrombosis of left femoral vein: Secondary | ICD-10-CM

## 2020-03-01 DIAGNOSIS — D62 Acute posthemorrhagic anemia: Secondary | ICD-10-CM

## 2020-03-01 DIAGNOSIS — R413 Other amnesia: Secondary | ICD-10-CM

## 2020-03-01 LAB — CBC
HCT: 26.6 % — ABNORMAL LOW (ref 36.0–46.0)
Hemoglobin: 8.9 g/dL — ABNORMAL LOW (ref 12.0–15.0)
MCH: 30.1 pg (ref 26.0–34.0)
MCHC: 33.5 g/dL (ref 30.0–36.0)
MCV: 89.9 fL (ref 80.0–100.0)
Platelets: 259 10*3/uL (ref 150–400)
RBC: 2.96 MIL/uL — ABNORMAL LOW (ref 3.87–5.11)
RDW: 12.6 % (ref 11.5–15.5)
WBC: 11.7 10*3/uL — ABNORMAL HIGH (ref 4.0–10.5)
nRBC: 0 % (ref 0.0–0.2)

## 2020-03-01 LAB — BASIC METABOLIC PANEL
Anion gap: 11 (ref 5–15)
BUN: 7 mg/dL — ABNORMAL LOW (ref 8–23)
CO2: 21 mmol/L — ABNORMAL LOW (ref 22–32)
Calcium: 7.7 mg/dL — ABNORMAL LOW (ref 8.9–10.3)
Chloride: 103 mmol/L (ref 98–111)
Creatinine, Ser: 0.68 mg/dL (ref 0.44–1.00)
GFR calc Af Amer: >60 mL/min (ref >60)
GFR calc non Af Amer: >60 mL/min (ref >60)
Glucose, Bld: 86 mg/dL (ref 70–99)
Potassium: 3.5 mmol/L (ref 3.5–5.1)
Sodium: 135 mmol/L (ref 135–145)

## 2020-03-01 LAB — URINE CULTURE

## 2020-03-01 LAB — TYPE AND SCREEN
ABO/RH(D): O POS
Antibody Screen: NEGATIVE

## 2020-03-01 LAB — MAGNESIUM: Magnesium: 1.6 mg/dL — ABNORMAL LOW (ref 1.7–2.4)

## 2020-03-01 LAB — PROCALCITONIN: Procalcitonin: 0.18 ng/mL

## 2020-03-01 LAB — HEPARIN LEVEL (UNFRACTIONATED)
Heparin Unfractionated: 0.17 IU/mL — ABNORMAL LOW (ref 0.30–0.70)
Heparin Unfractionated: 0.45 IU/mL (ref 0.30–0.70)
Heparin Unfractionated: 0.47 IU/mL (ref 0.30–0.70)

## 2020-03-01 LAB — HEMOGLOBIN: Hemoglobin: 9.9 g/dL — ABNORMAL LOW (ref 12.0–15.0)

## 2020-03-01 LAB — OCCULT BLOOD X 1 CARD TO LAB, STOOL: Fecal Occult Bld: NEGATIVE

## 2020-03-01 MED ORDER — SODIUM CHLORIDE 0.9 % IV SOLN
INTRAVENOUS | Status: DC | PRN
  Administered 2020-03-01: 250 mL via INTRAVENOUS
  Administered 2020-03-03: 500 mL via INTRAVENOUS

## 2020-03-01 MED ORDER — TRAZODONE HCL 50 MG PO TABS
50.0000 mg | ORAL_TABLET | Freq: Every evening | ORAL | Status: DC | PRN
  Administered 2020-03-01 – 2020-03-03 (×2): 50 mg via ORAL
  Filled 2020-03-01 (×3): qty 1

## 2020-03-01 MED ORDER — HEPARIN BOLUS VIA INFUSION
1000.0000 [IU] | Freq: Once | INTRAVENOUS | Status: AC
  Administered 2020-03-01: 1000 [IU] via INTRAVENOUS
  Filled 2020-03-01: qty 1000

## 2020-03-01 MED ORDER — HEPARIN BOLUS VIA INFUSION
500.0000 [IU] | Freq: Once | INTRAVENOUS | Status: AC
  Administered 2020-03-01: 500 [IU] via INTRAVENOUS
  Filled 2020-03-01: qty 500

## 2020-03-01 MED ORDER — POLYETHYLENE GLYCOL 3350 17 G PO PACK
17.0000 g | PACK | Freq: Every day | ORAL | Status: DC
  Administered 2020-03-01 – 2020-03-05 (×5): 17 g via ORAL
  Filled 2020-03-01 (×5): qty 1

## 2020-03-01 MED ORDER — INFLUENZA VAC A&B SA ADJ QUAD 0.5 ML IM PRSY
0.5000 mL | PREFILLED_SYRINGE | INTRAMUSCULAR | Status: DC
  Filled 2020-03-01 (×2): qty 0.5

## 2020-03-01 MED ORDER — MAGNESIUM SULFATE 2 GM/50ML IV SOLN
2.0000 g | Freq: Once | INTRAVENOUS | Status: AC
  Administered 2020-03-01: 2 g via INTRAVENOUS
  Filled 2020-03-01: qty 50

## 2020-03-01 MED ORDER — POTASSIUM CHLORIDE CRYS ER 20 MEQ PO TBCR
40.0000 meq | EXTENDED_RELEASE_TABLET | Freq: Once | ORAL | Status: AC
  Administered 2020-03-01: 40 meq via ORAL
  Filled 2020-03-01: qty 2

## 2020-03-01 NOTE — Progress Notes (Signed)
*  PRELIMINARY RESULTS* Echocardiogram 2D Echocardiogram has been performed.  Sherrie Sport 03/01/2020, 8:42 AM

## 2020-03-01 NOTE — Progress Notes (Signed)
[facility]  Daily Progress Note   Subjective  -   Patient is doing well.  No complaints today.  Breathing comfortably.  Not complaining of leg pain or swelling  Objective Vitals:   02/29/20 2303 03/01/20 0301 03/01/20 0739 03/01/20 1111  BP: 126/81 123/72 114/71 125/76  Pulse: (!) 115 (!) 111 (!) 110 (!) 110  Resp: 19  20 20   Temp: 98.1 F (36.7 C) 98.8 F (37.1 C) 99 F (37.2 C) 97.6 F (36.4 C)  TempSrc: Oral Oral Oral Oral  SpO2: 99% 95% 93% 100%  Weight:      Height:        Intake/Output Summary (Last 24 hours) at 03/01/2020 1505 Last data filed at 03/01/2020 1345 Gross per 24 hour  Intake 1872.83 ml  Output 300 ml  Net 1572.83 ml    PULM  CTAB CV  Regular but tachycardic VASC  Access site without hematoma  Laboratory CBC    Component Value Date/Time   WBC 11.7 (H) 03/01/2020 0611   HGB 8.9 (L) 03/01/2020 0611   HCT 26.6 (L) 03/01/2020 0611   PLT 259 03/01/2020 0611    BMET    Component Value Date/Time   NA 135 03/01/2020 0611   K 3.5 03/01/2020 0611   CL 103 03/01/2020 0611   CO2 21 (L) 03/01/2020 0611   GLUCOSE 86 03/01/2020 0611   BUN 7 (L) 03/01/2020 0611   CREATININE 0.68 03/01/2020 0611   CALCIUM 7.7 (L) 03/01/2020 0611   GFRNONAA >60 03/01/2020 0611   GFRAA >60 03/01/2020 7482    Assessment/Planning: POD #1 s/p right pulmonary thrombectomy, IVC filter placement, and left common femoral thrombectomy.   Doing well.  No shortness of breath.  Has been ambulating.  Not complaining of leg pain or swelling.  Okay to transition to oral anticoagulation from my standpoint  Plan to have a see her in the week in 1 to 2 weeks for follow-up  We will sign off, please call with questions      Leotis Pain  03/01/2020, 3:05 PM

## 2020-03-01 NOTE — Progress Notes (Signed)
Patient awake sitting up in bed c/o abdominal pain. Rate 5/10. Patient given oxycodone 5 mg. Pt o2 sat 98% room air. Will reassess pain and continue to monitor.

## 2020-03-01 NOTE — Progress Notes (Signed)
Patient re-educated on keeping right leg elevated. Reinforced education due to patient confusion. Bed alarm on and patient instructed to call for assistance. Will continue to monitor.

## 2020-03-01 NOTE — Progress Notes (Signed)
ANTICOAGULATION CONSULT NOTE  Pharmacy Consult for Heparin Infusion Indication: PE/DVT  Patient Measurements: Height: 5' (152.4 cm) Weight: 49.9 kg (113 lb 6.4 oz) IBW/kg (Calculated) : 45.5  Heparin dosing weight: 49.9 kg  Vital Signs: Temp: 97.6 F (36.4 C) (09/21 1111) Temp Source: Oral (09/21 1111) BP: 125/76 (09/21 1111) Pulse Rate: 110 (09/21 1111)  Labs: Recent Labs    02/28/20 1916 02/29/20 0008 02/29/20 0912 02/29/20 0923 02/29/20 2351 03/01/20 0611 03/01/20 1027  HGB 12.9  --   --   --   --  8.9*  --   HCT 38.5  --   --   --   --  26.6*  --   PLT 342  --   --   --   --  259  --   APTT  --  29  --   --   --   --   --   LABPROT  --  13.4  --   --   --   --   --   INR  --  1.1  --   --   --   --   --   HEPARINUNFRC  --   --   --  <0.10* 0.17*  --  0.47  CREATININE 0.92  --  0.79  --   --  0.68  --     Estimated Creatinine Clearance: 43 mL/min (by C-G formula based on SCr of 0.68 mg/dL).  Medical History: Past Medical History:  Diagnosis Date  . Anxiety   . Arthritis    NECK  . Cancer ([place])    BASAL CELL AND MELANOMA  . DAVF (dural arteriovenous fistula) 2015  . Hypertension     Assessment: 74yo female with medical history significant for HTN, basal cell carcinoma, history of tubular adenoma, status post laparoscopic hysterectomy 12/28/2019 on HRT's. Pt c/o abdominal pain; CTA abdomen and pelvis showing extensive PE w/o heart strain. 9/19 Doppler revealed  DVT in LLE; no evidence of DVT in RLE. Pharmacy has been consulted for heparin for dosing and monitoring.   Per chart review, no anticoagulants PTA.  Hgb 12.9, Plt 342  Goal of Therapy:  Heparin level 0.3-0.7 units/ml Monitor platelets by anticoagulation protocol: Yes   Plan:  --9/21 at 1027 HL= 0.47, therapeutic x 1. Continue heparin infusion at 1000 units/hr --Check confirmatory HL in 8 hours --Daily CBC per protocol. H&H, platelets down-trending. Likely s/t ABLA from procedure and  hemodilution. Discussed with provider, continue anticoagulation for now  Benita Gutter 03/01/2020 11:23 AM

## 2020-03-01 NOTE — Progress Notes (Signed)
ANTICOAGULATION CONSULT NOTE  Pharmacy Consult for Heparin Infusion Indication: PE/DVT  Patient Measurements: Height: 5' (152.4 cm) Weight: 49.9 kg (113 lb 6.4 oz) IBW/kg (Calculated) : 45.5  Heparin dosing weight: 49.9 kg  Vital Signs: Temp: 98.7 F (37.1 C) (09/21 1558) Temp Source: Oral (09/21 1558) BP: 119/74 (09/21 1558) Pulse Rate: 107 (09/21 1558)  Labs: Recent Labs    02/28/20 1916 02/28/20 1916 02/29/20 0008 02/29/20 0912 02/29/20 0923 02/29/20 2351 03/01/20 0611 03/01/20 1027 03/01/20 1847  HGB 12.9   < >  --   --   --   --  8.9*  --  9.9*  HCT 38.5  --   --   --   --   --  26.6*  --   --   PLT 342  --   --   --   --   --  259  --   --   APTT  --   --  29  --   --   --   --   --   --   LABPROT  --   --  13.4  --   --   --   --   --   --   INR  --   --  1.1  --   --   --   --   --   --   HEPARINUNFRC  --   --   --   --    < > 0.17*  --  0.47 0.45  CREATININE 0.92  --   --  0.79  --   --  0.68  --   --    < > = values in this interval not displayed.    Estimated Creatinine Clearance: 43 mL/min (by C-G formula based on SCr of 0.68 mg/dL).  Medical History: Past Medical History:  Diagnosis Date  . Anxiety   . Arthritis    NECK  . Cancer ([place])    BASAL CELL AND MELANOMA  . DAVF (dural arteriovenous fistula) 2015  . Hypertension     Assessment: 74yo female with medical history significant for HTN, basal cell carcinoma, history of tubular adenoma, status post laparoscopic hysterectomy 12/28/2019 on HRT's. Pt c/o abdominal pain; CTA abdomen and pelvis showing extensive PE w/o heart strain. 9/19 Doppler revealed  DVT in LLE; no evidence of DVT in RLE. Pharmacy has been consulted for heparin for dosing and monitoring.   Per chart review, no anticoagulants PTA.  Hgb 12.9, Plt 342  9/21 1027 HL 0.47  9/21 1847 HL 0.45   Goal of Therapy:  Heparin level 0.3-0.7 units/ml Monitor platelets by anticoagulation protocol: Yes   Plan:  Heparin level is  therapeutic. Will continue heparin infusion at 1000 units/hr. Recheck HL and CBC with AM labs.   Oswald Hillock, PharmD, BCPS 03/01/2020 7:20 PM

## 2020-03-01 NOTE — Progress Notes (Signed)
   03/01/20 2033  Assess: MEWS Score  Temp 98.8 F (37.1 C)  BP 131/76  Pulse Rate (!) 117  Resp 19  SpO2 100 %  O2 Device Room Air  patient has been sinus tach since admission, MD is aware.

## 2020-03-01 NOTE — Progress Notes (Signed)
ANTICOAGULATION CONSULT NOTE - Initial Consult  Pharmacy Consult for Heparin  Indication: PE/DVT  No Known Allergies  Patient Measurements: Height: 5' (152.4 cm) Weight: 49.9 kg (113 lb 6.4 oz) IBW/kg (Calculated) : 45.5  Heparin dosing weight: 49.9 kg  Vital Signs: Temp: 98.1 F (36.7 C) (09/20 2303) Temp Source: Oral (09/20 2303) BP: 126/81 (09/20 2303) Pulse Rate: 115 (09/20 2303)  Labs: Recent Labs    02/28/20 1916 02/29/20 0008 02/29/20 0912 02/29/20 0923 02/29/20 2351  HGB 12.9  --   --   --   --   HCT 38.5  --   --   --   --   PLT 342  --   --   --   --   APTT  --  29  --   --   --   LABPROT  --  13.4  --   --   --   INR  --  1.1  --   --   --   HEPARINUNFRC  --   --   --  <0.10* 0.17*  CREATININE 0.92  --  0.79  --   --     Estimated Creatinine Clearance: 43 mL/min (by C-G formula based on SCr of 0.79 mg/dL).  Medical History: Past Medical History:  Diagnosis Date  . Anxiety   . Arthritis    NECK  . Cancer ([place])    BASAL CELL AND MELANOMA  . DAVF (dural arteriovenous fistula) 2015  . Hypertension     Medications:  Medications Prior to Admission  Medication Sig Dispense Refill Last Dose  . buPROPion (WELLBUTRIN XL) 150 MG 24 hr tablet Take 150 mg by mouth every morning.   6 Past Week at Unknown time  . Calcium Carbonate-Vitamin D (OS-CAL 500 + D PO) Take 1 tablet by mouth daily.    Past Week at Unknown time  . cetirizine (ZYRTEC) 10 MG tablet Take 10 mg by mouth daily as needed for allergies.    Past Week at Unknown time  . Cholecalciferol (DIALYVITE VITAMIN D 5000) 125 MCG (5000 UT) capsule Take 5,000 Units by mouth daily.   Past Week at Unknown time  . estrogen, conjugated,-medroxyprogesterone (PREMPRO) 0.625-5 MG tablet Take 1 tablet by mouth every evening.    Past Week at Unknown time  . hydrochlorothiazide (HYDRODIURIL) 25 MG tablet Take 25 mg by mouth every evening.    Past Week at Unknown time  . Lutein 40 MG CAPS Take 40 mg by mouth daily.    Past Week at Unknown time  . vitamin B-12 (CYANOCOBALAMIN) 1000 MCG tablet Take 1,000 mcg by mouth daily.    Past Week at Unknown time  . docusate sodium (COLACE) 100 MG capsule Take 1 capsule (100 mg total) by mouth 2 (two) times daily. To keep stools soft (Patient not taking: Reported on 02/29/2020) 30 capsule 0 Completed Course at Unknown time  . oxyCODONE (OXY IR/ROXICODONE) 5 MG immediate release tablet Take 1 tablet (5 mg total) by mouth every 4 (four) hours as needed for severe pain. (Patient not taking: Reported on 02/29/2020) 20 tablet 0 Completed Course at Unknown time    Assessment: 74yo female with medical history significant for HTN, basal cell carcinoma, history of tubular adenoma, status post laparoscopic hysterectomy 12/28/2019 on HRT's. Pt c/o abdominal pain; CTA abdomen and pelvis showing extensive PE w/o heart strain. 9/19 Doppler revealed  DVT in LLE; no evidence of DVT in RLE. Pharmacy has been consulted for heparin for dosing  and monitoring.   Per chart review, no anticoagulants PTA.  Hgb 12.9, Plt 342  9/19 Heparin 2700 units bolus x 1 then infusion at 650 units/hr 9/20 @ 0923 subtherapeutic at <0.10 - verified with nurse that there were no interruptions to heparin drip. Patient went for thrombectomy and had alteplase (@1152 ) and 3000 units heparin (@1144 ) during the procedure. Heparin was turned off at 1130 for ~2h for procedure. Restarted at same rate at 1324.   Goal of Therapy:  Heparin level 0.3-0.7 units/ml Monitor platelets by anticoagulation protocol: Yes   Plan:  HL subtherapeutic <0.10 @0923 . Will hold off on giving bolus since patient received 3000 units heparin during procedure and alteplase. Will increase the rate to 800 units/hr. Check HL in 8 hours and daily CBC with AM labs  9/20:  HL @ 2351 = 0.17 Will order Heparin 1500 units IV X 1 and increase drip rate to 1000 units/hr. Will recheck HL 8 hrs after rate change.   Orene Desanctis, PharmD Pharmacy  Resident  03/01/2020 1:12 AM

## 2020-03-01 NOTE — Progress Notes (Signed)
Patient ID: Patricia Miller, female   DOB: 20-Jun-1945, 74 y.o.   MRN: [id number] [facility] PROGRESS NOTE  REWA WEISSBERG GPQ:[id number] DOB: 1946-02-25 DOA: 02/28/2020 PCP: Rusty Aus, MD  HPI/Subjective: Patient feels her abdominal pain is better than yesterday.  Has trouble taking a real deep breath.  Only slight cough.  Came in with abdominal pain and diagnosed with pulmonary emboli and DVT.  Objective: Vitals:   03/01/20 0739 03/01/20 1111  BP: 114/71 125/76  Pulse: (!) 110 (!) 110  Resp: 20 20  Temp: 99 F (37.2 C) 97.6 F (36.4 C)  SpO2: 93% 100%    Intake/Output Summary (Last 24 hours) at 03/01/2020 1416 Last data filed at 03/01/2020 1345 Gross per 24 hour  Intake 1872.83 ml  Output 300 ml  Net 1572.83 ml   Filed Weights   02/28/20 1915 02/29/20 0250 02/29/20 1111  Weight: 38.6 kg 51.4 kg 49.9 kg    ROS: Review of Systems  Respiratory: Positive for cough and shortness of breath.   Cardiovascular: Negative for chest pain.  Gastrointestinal: Positive for abdominal pain. Negative for nausea and vomiting.   Exam: Physical Exam HENT:     Head: Normocephalic.     Nose: No mucosal edema.     Mouth/Throat:     Pharynx: No oropharyngeal exudate.  Eyes:     General: Lids are normal.     Conjunctiva/sclera: Conjunctivae normal.  Cardiovascular:     Rate and Rhythm: Regular rhythm. Tachycardia present.     Heart sounds: Normal heart sounds, S1 normal and S2 normal.  Pulmonary:     Breath sounds: Examination of the right-lower field reveals decreased breath sounds. Examination of the left-lower field reveals decreased breath sounds. Decreased breath sounds present. No wheezing, rhonchi or rales.  Abdominal:     Palpations: Abdomen is soft.     Tenderness: There is no abdominal tenderness.  Musculoskeletal:     Right lower leg: No swelling.     Left lower leg: No swelling.  Skin:    General: Skin is warm.     Findings: No rash.  Neurological:      Mental Status: She is alert.       Data Reviewed: Basic Metabolic Panel: Recent Labs  Lab 02/28/20 1916 02/29/20 0912 03/01/20 0611  NA 132* 132* 135  K 3.3* 3.1* 3.5  CL 92* 97* 103  CO2 23 22 21*  GLUCOSE 96 86 86  BUN 14 10 7*  CREATININE 0.92 0.79 0.68  CALCIUM 8.9 8.2* 7.7*  MG  --   --  1.6*   Liver Function Tests: Recent Labs  Lab 02/28/20 1916 02/29/20 0912  AST 15 13*  ALT 9 8  ALKPHOS 67 63  BILITOT 1.3* 0.9  PROT 7.8 7.0  ALBUMIN 3.3* 2.9*   Recent Labs  Lab 02/28/20 1916  LIPASE 23   CBC: Recent Labs  Lab 02/28/20 1916 03/01/20 0611  WBC 12.1* 11.7*  HGB 12.9 8.9*  HCT 38.5 26.6*  MCV 89.3 89.9  PLT 342 259     Recent Results (from the past 240 hour(s))  Urine Culture     Status: Abnormal   Collection Time: 02/28/20  7:16 PM   Specimen: Urine, Random  Result Value Ref Range Status   Specimen Description   Final    URINE, RANDOM Performed at [facility], [address], [place], [place] [address]    Special Requests   Final    NONE Performed at  [facility], [place] [address] [place], [place] [address]    Culture MULTIPLE SPECIES PRESENT, SUGGEST RECOLLECTION (A)  Final   Report Status 03/01/2020 FINAL  Final  SARS Coronavirus 2 by RT PCR (hospital order, performed in [facility] hospital lab) Nasopharyngeal Nasopharyngeal Swab     Status: None   Collection Time: 02/29/20 12:08 AM   Specimen: Nasopharyngeal Swab  Result Value Ref Range Status   SARS Coronavirus 2 NEGATIVE NEGATIVE Final    Comment: (NOTE) SARS-CoV-2 target nucleic acids are NOT DETECTED.  The SARS-CoV-2 RNA is generally detectable in upper and lower respiratory specimens during the acute phase of infection. The lowest concentration of SARS-CoV-2 viral copies this assay can detect is 250 copies / mL. A negative result does not preclude SARS-CoV-2 infection and should not be used as the sole basis for treatment or other patient management  decisions.  A negative result may occur with improper specimen collection / handling, submission of specimen other than nasopharyngeal swab, presence of viral mutation(s) within the areas targeted by this assay, and inadequate number of viral copies (<250 copies / mL). A negative result must be combined with clinical observations, patient history, and epidemiological information.  Fact Sheet for Patients:   StrictlyIdeas.no  Fact Sheet for Healthcare Providers: BankingDealers.co.za  This test is not yet approved or  cleared by the [place] FDA and has been authorized for detection and/or diagnosis of SARS-CoV-2 by FDA under an Emergency Use Authorization (EUA).  This EUA will remain in effect (meaning this test can be used) for the duration of the COVID-19 declaration under Section 564(b)(1) of the Act, 21 U.S.C. section 360bbb-3(b)(1), unless the authorization is terminated or revoked sooner.  Performed at [facility], [place] [place], [place] [address]   Blood culture (routine x 2)     Status: None (Preliminary result)   Collection Time: 02/29/20 12:08 AM   Specimen: BLOOD  Result Value Ref Range Status   Specimen Description BLOOD RIGHT [facility]  Final   Special Requests   Final    BOTTLES DRAWN AEROBIC AND ANAEROBIC Blood Culture adequate volume   Culture  Setup Time   Final    GRAM POSITIVE COCCI AEROBIC BOTTLE ONLY CRITICAL VALUE NOTED.  VALUE IS CONSISTENT WITH PREVIOUSLY REPORTED AND CALLED VALUE. Performed at [facility], [address] [place], [place] [address]    Culture [facility] POSITIVE COCCI  Final   Report Status PENDING  Incomplete  Blood culture (routine x 2)     Status: Abnormal (Preliminary result)   Collection Time: 02/29/20 12:08 AM   Specimen: BLOOD  Result Value Ref Range Status   Specimen Description   Final    BLOOD LEFT [facility] Performed at [facility], [address], [place], [place] [address]    Special Requests   Final    BOTTLES DRAWN AEROBIC AND ANAEROBIC Blood Culture adequate volume Performed at [facility], [address] [place], [place] [address]    Culture  Setup Time   Final    GRAM POSITIVE COCCI ANAEROBIC BOTTLE ONLY CRITICAL RESULT CALLED TO, READ BACK BY AND VERIFIED WITH: ASAJAH DUNCAN 02/29/20 AT 1702 BY ACR    Culture (A)  Final    STAPHYLOCOCCUS WARNERI THE SIGNIFICANCE OF ISOLATING THIS ORGANISM FROM A SINGLE SET OF BLOOD CULTURES WHEN MULTIPLE SETS ARE DRAWN IS UNCERTAIN. PLEASE NOTIFY THE MICROBIOLOGY DEPARTMENT WITHIN ONE WEEK IF SPECIATION AND SENSITIVITIES ARE REQUIRED. Performed at [facility], [place] [address] [place], [place] [address]    Report Status PENDING  Incomplete  Blood Culture ID Panel (Reflexed)     Status: Abnormal   Collection Time: 02/29/20 12:08 AM  Result Value Ref Range Status   Enterococcus faecalis NOT DETECTED NOT DETECTED Final   Enterococcus Faecium NOT DETECTED NOT DETECTED Final   Listeria monocytogenes NOT DETECTED NOT DETECTED Final   Staphylococcus species DETECTED (A) NOT DETECTED Final    Comment: CRITICAL RESULT CALLED TO, READ BACK BY AND VERIFIED WITH: ASAJAH DUNCAN 02/29/20 AT 1702 BY ACR    Staphylococcus aureus (BCID) NOT DETECTED NOT DETECTED Final   Staphylococcus epidermidis NOT DETECTED NOT DETECTED Final   Staphylococcus lugdunensis NOT DETECTED NOT DETECTED Final   Streptococcus species NOT DETECTED NOT DETECTED Final   Streptococcus agalactiae NOT DETECTED NOT DETECTED Final   Streptococcus pneumoniae NOT DETECTED NOT DETECTED Final   Streptococcus pyogenes NOT DETECTED NOT DETECTED Final   A.calcoaceticus-baumannii NOT DETECTED NOT DETECTED Final   Bacteroides fragilis NOT DETECTED NOT DETECTED Final   Enterobacterales NOT DETECTED NOT DETECTED Final   Enterobacter cloacae complex NOT DETECTED NOT DETECTED Final   Escherichia coli NOT DETECTED NOT  DETECTED Final   Klebsiella aerogenes NOT DETECTED NOT DETECTED Final   Klebsiella oxytoca NOT DETECTED NOT DETECTED Final   Klebsiella pneumoniae NOT DETECTED NOT DETECTED Final   Proteus species NOT DETECTED NOT DETECTED Final   Salmonella species NOT DETECTED NOT DETECTED Final   Serratia marcescens NOT DETECTED NOT DETECTED Final   Haemophilus influenzae NOT DETECTED NOT DETECTED Final   Neisseria meningitidis NOT DETECTED NOT DETECTED Final   Pseudomonas aeruginosa NOT DETECTED NOT DETECTED Final   Stenotrophomonas maltophilia NOT DETECTED NOT DETECTED Final   Candida albicans NOT DETECTED NOT DETECTED Final   Candida auris NOT DETECTED NOT DETECTED Final   Candida glabrata NOT DETECTED NOT DETECTED Final   Candida krusei NOT DETECTED NOT DETECTED Final   Candida parapsilosis NOT DETECTED NOT DETECTED Final   Candida tropicalis NOT DETECTED NOT DETECTED Final   Cryptococcus neoformans/gattii NOT DETECTED NOT DETECTED Final    Comment: Performed at [facility], [address] [place], [place] [address]     Studies: CT ABDOMEN PELVIS W CONTRAST  Addendum Date: 02/28/2020   ADDENDUM REPORT: 02/28/2020 23:05 ADDENDUM: Results were discussed with Dr. Ellender Hose at 10:56 p.m. [place] on February 28, 2020. Electronically Signed   By: Virgina Norfolk M.D.   On: 02/28/2020 23:05   Result Date: 02/28/2020 CLINICAL DATA:  Right lower quadrant pain. EXAM: CT ABDOMEN AND PELVIS WITH CONTRAST TECHNIQUE: Multidetector CT imaging of the abdomen and pelvis was performed using the standard protocol following bolus administration of intravenous contrast. CONTRAST:  64mL OMNIPAQUE IOHEXOL 300 MG/ML  SOLN COMPARISON:  None. FINDINGS: Lower chest: Marked severity intraluminal low attenuation is seen within the right pulmonary artery and several of its middle lobe and lower lobe branches. Mild to moderate severity multifocal infiltrates are seen within the right lower lobe. A 1.1 cm x 1.2 cm  noncalcified lung nodule versus focal atelectasis is seen within the lateral aspect of the mid right lung. A very small right pleural effusion is seen. Hepatobiliary: No focal liver abnormality is seen. No gallstones, gallbladder wall thickening, or biliary dilatation. Pancreas: Unremarkable. No pancreatic ductal dilatation or surrounding inflammatory changes. Spleen: Normal in size without focal abnormality. Adrenals/Urinary Tract: Adrenal glands are unremarkable. Kidneys are normal, without renal calculi, focal lesion, or hydronephrosis. Bladder is unremarkable.  Stomach/Bowel: Stomach is within normal limits. Appendix appears normal. No evidence of bowel dilatation. Mildly thickened small bowel loops are seen within the posterior aspect of the lower abdomen. Noninflamed diverticula are seen throughout the sigmoid colon. Vascular/Lymphatic: There is mild calcification of the abdominal aorta and bilateral common iliac arteries, without evidence of aneurysmal dilatation. A moderate amount of intraluminal low attenuation is seen within the left common femoral vein (axial CT images 72 through 85, CT series number 2). No enlarged abdominal or pelvic lymph nodes. Reproductive: Status post hysterectomy. No adnexal masses. Other: No abdominal wall hernia or abnormality. No abdominopelvic ascites. Musculoskeletal: There is approximately 2 mm anterolisthesis of the L4 vertebral body on L5, with mild multilevel degenerative changes noted throughout the remainder of the lumbar spine. IMPRESSION: 1. Marked severity pulmonary embolism involving the right pulmonary artery and several of its middle lobe and lower lobe branches. 2. Mild to moderate severity multifocal infiltrates within the right lower lobe, consistent with pneumonia. 3. 1.1 cm x 1.2 cm noncalcified lung nodule versus focal atelectasis within the lateral aspect of the mid right lung. Correlation with follow-up chest CT is recommended to determine stability, as an  underlying neoplastic process cannot be excluded. 4. Moderate amount of DVT within the left common femoral vein. Further evaluation with left lower extremity venous Doppler ultrasound is recommended. 5. Mildly thickened small bowel loops within the lower abdomen which may represent mild enteritis. 6. Sigmoid diverticulosis. Aortic Atherosclerosis (ICD10-I70.0). Electronically Signed: By: Virgina Norfolk M.D. On: 02/28/2020 22:52   PERIPHERAL VASCULAR CATHETERIZATION  Result Date: 02/29/2020 See op note  US Venous Img Lower Bilateral  Result Date: 02/29/2020 CLINICAL DATA:  Left femoral DVT on recent CT examination. EXAM: BILATERAL LOWER EXTREMITY VENOUS DOPPLER ULTRASOUND TECHNIQUE: Gray-scale sonography with graded compression, as well as color Doppler and duplex ultrasound were performed to evaluate the lower extremity deep venous systems from the level of the common femoral vein and including the common femoral, femoral, profunda femoral, popliteal and calf veins including the posterior tibial, peroneal and gastrocnemius veins when visible. The superficial great saphenous vein was also interrogated. Spectral Doppler was utilized to evaluate flow at rest and with distal augmentation maneuvers in the common femoral, femoral and popliteal veins. COMPARISON:  None. FINDINGS: RIGHT LOWER EXTREMITY Common Femoral Vein: No evidence of thrombus. Normal compressibility, respiratory phasicity and response to augmentation. Saphenofemoral Junction: No evidence of thrombus. Normal compressibility and flow on color Doppler imaging. Profunda Femoral Vein: No evidence of thrombus. Normal compressibility and flow on color Doppler imaging. Femoral Vein: No evidence of thrombus. Normal compressibility, respiratory phasicity and response to augmentation. Popliteal Vein: No evidence of thrombus. Normal compressibility, respiratory phasicity and response to augmentation. Calf Veins: No evidence of thrombus. Normal  compressibility and flow on color Doppler imaging. Superficial Great Saphenous Vein: No evidence of thrombus. Normal compressibility. Venous Reflux:  None. Other Findings:  None. LEFT LOWER EXTREMITY Common Femoral Vein: Thrombus is noted with decreased compressibility. Saphenofemoral Junction: Thrombus is noted with decreased compressibility. Profunda Femoral Vein: No evidence of thrombus. Normal compressibility and flow on color Doppler imaging. Femoral Vein: Thrombus is noted with decreased compressibility. Popliteal Vein: Thrombus is noted with decreased compressibility. Calf Veins: Thrombus is noted in the peroneal vein. The posterior tibial vein is within normal limits. Superficial Great Saphenous Vein: No evidence of thrombus. Normal compressibility. Venous Reflux:  None. Other Findings:  None. IMPRESSION: No evidence of deep venous thrombosis in the right lower extremity. Extensive deep venous thrombosis in the  left lower extremity as described. Electronically Signed   By: Inez Catalina M.D.   On: 02/29/2020 00:56    Scheduled Meds: . buPROPion  150 mg Oral Daily  . feeding supplement (ENSURE ENLIVE)  237 mL Oral BID BM  . multivitamin with minerals  1 tablet Oral Daily  . polyethylene glycol  17 g Oral Daily  . sodium chloride flush  10-40 mL Intracatheter Q12H   Continuous Infusions: . azithromycin Stopped (03/01/20 0011)  . heparin 1,000 Units/hr (03/01/20 0300)  . vancomycin      Assessment/Plan:  1. Acute pulmonary emboli seen on CT scan of the abdomen.  Left lower extremity extensive DVT.  Patient currently on heparin drip.  Vascular surgery on 02/29/2020 did IVC filter, mechanical thrombectomy to left common femoral and proximal superficial veins and also thrombolysis and thrombectomy of pulmonary arteries.  With drop in hemoglobin we will keep on heparin drip today recheck hemoglobin tonight and tomorrow morning.  If hemoglobin stable hopefully can switch over to Eliquis  soon. 2. Drop in hemoglobin acute blood loss anemia likely postprocedural and dilutional from IV fluids.  Discontinue IV fluids.  Monitor closely on heparin drip. 3. Hypokalemia.  Replace potassium orally. 4. Essential hypertension and tachycardia holding antihypertensive medications currently.  Hold hydrochlorothiazide secondary to hypokalemia. 5. Right lobar pneumonia seen on CT scan.  Patient had a positive blood culture.  Staphylococcus warneri growing out of the culture.  On empiric vancomycin currently and Zithromax.  Echocardiogram pending.  Repeat blood cultures tomorrow morning. 6. Pulmonary nodule greater than 1 cm this will need close clinical follow-up as outpatient with oncology office. 7. Anxiety restart Wellbutrin. 8. Memory loss as per family will check B12, RPR and TSH. 9. Physical therapy evaluation tomorrow  Code Status:     Code Status Orders  (From admission, onward)         Start     Ordered   02/29/20 0035  Full code  Continuous        02/29/20 0036        Code Status History    Date Active Date Inactive Code Status Order ID Comments User Context   12/28/2019 0627 12/28/2019 1733 Full Code [id number]  Benjaman Kindler, MD Inpatient   Advance Care Planning Activity     Family Communication: Spoke with main contact Margaretha Sheffield on the phone Disposition Plan: Status is: Inpatient  Dispo: The patient is from: Home              Anticipated d/c is to: Home              Anticipated d/c date is: Likely will need a few days here in the hospital              Patient currently being treated for acute PE and DVT.  Also had a drop in hemoglobin watching hemoglobin closely.  Consultants:  Vascular surgery  Procedures:  Vascular surgery procedures   Antibiotics:  Vancomycin  Zithromax  Time spent: 28 minutes  [place]

## 2020-03-02 DIAGNOSIS — I82402 Acute embolism and thrombosis of unspecified deep veins of left lower extremity: Secondary | ICD-10-CM

## 2020-03-02 LAB — CBC
HCT: 23.5 % — ABNORMAL LOW (ref 36.0–46.0)
Hemoglobin: 7.8 g/dL — ABNORMAL LOW (ref 12.0–15.0)
MCH: 29.9 pg (ref 26.0–34.0)
MCHC: 33.2 g/dL (ref 30.0–36.0)
MCV: 90 fL (ref 80.0–100.0)
Platelets: 269 10*3/uL (ref 150–400)
RBC: 2.61 MIL/uL — ABNORMAL LOW (ref 3.87–5.11)
RDW: 12.6 % (ref 11.5–15.5)
WBC: 9.4 10*3/uL (ref 4.0–10.5)
nRBC: 0 % (ref 0.0–0.2)

## 2020-03-02 LAB — ECHOCARDIOGRAM COMPLETE
AR max vel: 2.69 cm2
AV Area VTI: 3.04 cm2
AV Area mean vel: 2.65 cm2
AV Mean grad: 4.5 mmHg
AV Peak grad: 8.4 mmHg
Ao pk vel: 1.45 m/s
Area-P 1/2: 4.33 cm2
Height: 60 in
S' Lateral: 2.2 cm
Weight: 1760 oz

## 2020-03-02 LAB — BASIC METABOLIC PANEL
Anion gap: 8 (ref 5–15)
BUN: 6 mg/dL — ABNORMAL LOW (ref 8–23)
CO2: 22 mmol/L (ref 22–32)
Calcium: 8 mg/dL — ABNORMAL LOW (ref 8.9–10.3)
Chloride: 107 mmol/L (ref 98–111)
Creatinine, Ser: 0.74 mg/dL (ref 0.44–1.00)
GFR calc Af Amer: >60 mL/min (ref >60)
GFR calc non Af Amer: >60 mL/min (ref >60)
Glucose, Bld: 100 mg/dL — ABNORMAL HIGH (ref 70–99)
Potassium: 3.8 mmol/L (ref 3.5–5.1)
Sodium: 137 mmol/L (ref 135–145)

## 2020-03-02 LAB — CULTURE, BLOOD (ROUTINE X 2): Special Requests: ADEQUATE

## 2020-03-02 LAB — HEPARIN LEVEL (UNFRACTIONATED): Heparin Unfractionated: 0.2 IU/mL — ABNORMAL LOW (ref 0.30–0.70)

## 2020-03-02 LAB — RPR
RPR Ser Ql: REACTIVE — AB
RPR Titer: 1:1 {titer}

## 2020-03-02 LAB — HEMOGLOBIN: Hemoglobin: 8.1 g/dL — ABNORMAL LOW (ref 12.0–15.0)

## 2020-03-02 LAB — VITAMIN B12: Vitamin B-12: 4403 pg/mL — ABNORMAL HIGH (ref 180–914)

## 2020-03-02 LAB — IRON AND TIBC
Iron: 13 ug/dL — ABNORMAL LOW (ref 28–170)
Saturation Ratios: 8 % — ABNORMAL LOW (ref 10.4–31.8)
TIBC: 169 ug/dL — ABNORMAL LOW (ref 250–450)
UIBC: 156 ug/dL

## 2020-03-02 LAB — TSH: TSH: 3.185 u[IU]/mL (ref 0.350–4.500)

## 2020-03-02 LAB — FERRITIN: Ferritin: 204 ng/mL (ref 11–307)

## 2020-03-02 LAB — PROCALCITONIN: Procalcitonin: 0.13 ng/mL

## 2020-03-02 MED ORDER — POTASSIUM CHLORIDE 20 MEQ PO PACK
40.0000 meq | PACK | Freq: Two times a day (BID) | ORAL | Status: AC
  Administered 2020-03-02 (×2): 40 meq via ORAL
  Filled 2020-03-02 (×2): qty 2

## 2020-03-02 MED ORDER — APIXABAN 5 MG PO TABS
10.0000 mg | ORAL_TABLET | Freq: Two times a day (BID) | ORAL | Status: DC
  Administered 2020-03-02 – 2020-03-05 (×6): 10 mg via ORAL
  Filled 2020-03-02 (×7): qty 2

## 2020-03-02 MED ORDER — APIXABAN 5 MG PO TABS: 5.0000 mg | ORAL_TABLET | Freq: Two times a day (BID) | ORAL | Status: DC

## 2020-03-02 MED ORDER — SODIUM CHLORIDE 0.9 % IV SOLN
300.0000 mg | Freq: Once | INTRAVENOUS | Status: AC
  Administered 2020-03-02: 300 mg via INTRAVENOUS
  Filled 2020-03-02: qty 15

## 2020-03-02 NOTE — Plan of Care (Signed)
  Problem: Education: Goal: Knowledge of General Education information will improve Description: Including pain rating scale, medication(s)/side effects and non-pharmacologic comfort measures Outcome: Not Progressing Note: Patient has periods of confusion. Patient was unaware of pneumonia diagnosis and why she was receiving antibiotics. Patient has concerns about diagnosis. Patient became very disoriented after awaken for midnight vitals. Explained to patient about hospital protocol. Patient became upset with staff.

## 2020-03-02 NOTE — Progress Notes (Signed)
ANTICOAGULATION CONSULT NOTE  Pharmacy Consult for Apixaban Indication: PE/DVT  Patient Measurements: Height: 5' (152.4 cm) Weight: 49.9 kg (113 lb 6.4 oz) IBW/kg (Calculated) : 45.5  Heparin dosing weight: 49.9 kg  Vital Signs: Temp: 98.4 F (36.9 C) (09/22 1226) Temp Source: Oral (09/22 1226) BP: 117/71 (09/22 1226) Pulse Rate: 94 (09/22 1226)  Labs: Recent Labs    02/28/20 1916 02/28/20 1916 02/29/20 0008 02/29/20 0912 02/29/20 9449 02/29/20 2351 03/01/20 6759 03/01/20 1638 03/01/20 1027 03/01/20 1847 03/01/20 1847 03/02/20 0624 03/02/20 1301  HGB 12.9   < >  --   --   --   --  8.9*   < >  --  9.9*   < > 7.8* 8.1*  HCT 38.5  --   --   --   --   --  26.6*  --   --   --   --  23.5*  --   PLT 342  --   --   --   --   --  259  --   --   --   --  269  --   APTT  --   --  29  --   --   --   --   --   --   --   --   --   --   LABPROT  --   --  13.4  --   --   --   --   --   --   --   --   --   --   INR  --   --  1.1  --   --   --   --   --   --   --   --   --   --   HEPARINUNFRC  --   --   --   --    < >   < >  --   --  0.47 0.45  --  0.20*  --   CREATININE 0.92   < >  --  0.79  --   --  0.68  --   --   --   --  0.74  --    < > = values in this interval not displayed.    Estimated Creatinine Clearance: 43 mL/min (by C-G formula based on SCr of 0.74 mg/dL).  Medical History: Past Medical History:  Diagnosis Date  . Anxiety   . Arthritis    NECK  . Cancer ([place])    BASAL CELL AND MELANOMA  . DAVF (dural arteriovenous fistula) 2015  . Hypertension     Assessment: 74yo female with medical history significant for HTN, basal cell carcinoma, history of tubular adenoma, status post laparoscopic hysterectomy 12/28/2019 on HRT's. Pt c/o abdominal pain; CTA abdomen and pelvis showing extensive PE w/o heart strain. 9/19 Doppler revealed  DVT in LLE; no evidence of DVT in RLE. 9/20 patient was seen by vascular surgery, received thrombolysis in her right pulmonary artery,  mechanical thrombectomy in the right main, middle and lower lobe pulmonary arteries, IVC filter, and mechanical thrombectomy to the left common femoral and proximal superficial femoral veins. Patient was previously on heparin drip and pharmacy has been consulted for Apixaban dosing.   Per chart review, no anticoagulants PTA.  Baseline Hgb 12.9, Plt 342  9/22 Hgb 9.9>7.8 - MD aware of drop in Hgb; repeat Hgb 8.1 looks to be stabilizing Plt are wnl 269  Goal of Therapy:  Monitor platelets by anticoagulation protocol: Yes   Plan:  Start Apixaban 10mg  BID x7 days then Apixaban 5mg  BID thereafter Monitor CBC with AM labs.   Sherilyn Banker, PharmD Pharmacy Resident  03/02/2020 3:26 PM

## 2020-03-02 NOTE — Progress Notes (Signed)
ANTICOAGULATION CONSULT NOTE  Pharmacy Consult for Heparin Infusion Indication: PE/DVT  Patient Measurements: Height: 5' (152.4 cm) Weight: 49.9 kg (113 lb 6.4 oz) IBW/kg (Calculated) : 45.5  Heparin dosing weight: 49.9 kg  Vital Signs: Temp: 99.3 F (37.4 C) (09/22 0855) Temp Source: Oral (09/22 0855) BP: 124/83 (09/22 0855) Pulse Rate: 103 (09/22 0855)  Labs: Recent Labs    02/28/20 1916 02/28/20 1916 02/29/20 0008 02/29/20 0912 02/29/20 5176 02/29/20 2351 03/01/20 1607 03/01/20 0611 03/01/20 1027 03/01/20 1847 03/02/20 0624  HGB 12.9   < >  --   --   --   --  8.9*   < >  --  9.9* 7.8*  HCT 38.5  --   --   --   --   --  26.6*  --   --   --  23.5*  PLT 342  --   --   --   --   --  259  --   --   --  269  APTT  --   --  29  --   --   --   --   --   --   --   --   LABPROT  --   --  13.4  --   --   --   --   --   --   --   --   INR  --   --  1.1  --   --   --   --   --   --   --   --   HEPARINUNFRC  --   --   --   --    < >   < >  --   --  0.47 0.45 0.20*  CREATININE 0.92   < >  --  0.79  --   --  0.68  --   --   --  0.74   < > = values in this interval not displayed.    Estimated Creatinine Clearance: 43 mL/min (by C-G formula based on SCr of 0.74 mg/dL).  Medical History: Past Medical History:  Diagnosis Date  . Anxiety   . Arthritis    NECK  . Cancer ([place])    BASAL CELL AND MELANOMA  . DAVF (dural arteriovenous fistula) 2015  . Hypertension     Assessment: 74yo female with medical history significant for HTN, basal cell carcinoma, history of tubular adenoma, status post laparoscopic hysterectomy 12/28/2019 on HRT's. Pt c/o abdominal pain; CTA abdomen and pelvis showing extensive PE w/o heart strain. 9/19 Doppler revealed  DVT in LLE; no evidence of DVT in RLE. Pharmacy has been consulted for heparin for dosing and monitoring.   Per chart review, no anticoagulants PTA.  Hgb 12.9, Plt 342  9/22 Hgb 9.9>7.8 - MD aware of drop in Hgb  9/21 1027 HL 0.47   9/21 1847 HL 0.45  9/22 0624 HL 0.20  Goal of Therapy:  Heparin level 0.3-0.7 units/ml Monitor platelets by anticoagulation protocol: Yes   Plan:  Heparin level is subtherapeutic. Will not bolus due to drop in Hgb, but will increase heparin infusion to 1100 units/hr. Recheck HL in 8 hours and CBC with AM labs.   Sherilyn Banker, PharmD Pharmacy Resident  03/02/2020 10:56 AM

## 2020-03-02 NOTE — Progress Notes (Signed)
PROGRESS NOTE    Patricia Miller  JSE:[id number] DOB: 28-Oct-1945 DOA: 02/28/2020 PCP: Rusty Aus, MD  Chief complaint shortness of breath.  Brief Narrative:  Patricia Miller is a 74 y.o. female with medical history significant for HTN, basal cell carcinoma, history of tubular adenoma, status post laparoscopic hysterectomy 12/28/2019 on HRT's, who presents to the emergency room with a several day history of dull achy right upper and lower abdominal pain, nonradiating, of moderate to severe intensity now associated with shortness of breath. CT scan showed marked severity PE involving right pulmonary artery and several is middle lobe and the lower lobe branches.  Also showed mild to moderate multifocal infiltrates with the right lower lobe consistent with pneumonia.  Duplex ultrasound also showed DVT in the left common femoral vein.  9/20.  Patient was seen by vascular surgery, received thrombolysis in her right pulmonary artery, mechanical thrombectomy in the right main, middle and lower lobe pulmonary arteries, IVC filter, and mechanical thrombectomy to the left common femoral and proximal superficial femoral veins.  Patient is placed on heparin drip.  9/21.  Patient was placed on antibiotics with Zithromax and vancomycin for positive blood culture with Rothia Mucilaginosa.   9/22.  Hemoglobin stable.  Anticoagulation changed to Eliquis.  Consulted ID for bacteremia.   Assessment & Plan:   Active Problems:   Acute pulmonary embolism (HCC)   Acute deep vein thrombosis (DVT) of femoral vein of left lower extremity (HCC)   RLL pneumonia   Pulmonary nodule 1 cm or greater in diameter   Hormone replacement therapy (HRT)   History of squamous cell carcinoma   S/P laparoscopic hysterectomy 12/2019    Hx of Tubular adenoma   HTN (hypertension)   Hypokalemia   Memory loss   Acute blood loss anemia  #1.  Acute pulmonary emboli and a left lower extremity DVT. Status post thrombolysis,  thrombectomy of the pulmonary arteries, thrombectomy left common femoral veins, IVC filter. Condition is improving.  Patient hemoglobin has been more stable now, anticoagulation changed to Eliquis.  #2.  Rothia Mucilaginosa bacteremia. Repeat culture was sent out.  Currently covered with Zithromax and vancomycin.  Obtain consult from ID.  3.  Acute blood loss anemia with iron deficient anemia. Mainly from intervention with vascular procedure.  Patient also has significant iron deficiency, will give IV iron.  Recheck a CBC tomorrow.  B12 level adequate.  4.  Hypokalemia. Supplement given today.  Recheck level and magnesium level tomorrow.  5.  Right middle lobe pneumonia. Antibiotics as above.  6.  Pulmonary nodule on CT scan. Follow-up as outpatient.      DVT prophylaxis: Eliquis Code Status: Full Family Communication: sister updated  .   Status is: Inpatient  Remains inpatient appropriate because:Inpatient level of care appropriate due to severity of illness   Dispo: The patient is from: Home              Anticipated d/c is to: Home              Anticipated d/c date is: 2 days              Patient currently is not medically stable to d/c.        I/O last 3 completed shifts: In: 2446.8 [P.O.:240; I.V.:1011.1; IV Piggyback:1195.7] Out: 500 [Urine:500] Total I/O In: 120 [P.O.:120] Out: -      Consultants:   ID  Procedures:   Antimicrobials: vanc and zithromax  Subjective: Patient overall feels well  today.  No fever or chills.  No cough or shortness of breath.  No chest pain today. No nausea vomiting abdominal pain.  No diarrhea constipation. No dysuria hematuria.  Objective: Vitals:   03/01/20 2033 03/02/20 0403 03/02/20 0855 03/02/20 1226  BP: 131/76 (!) 114/57 124/83 117/71  Pulse: (!) 117 (!) 121 (!) 103 94  Resp: 19 (!) 21 16 17   Temp: 98.8 F (37.1 C) 99.8 F (37.7 C) 99.3 F (37.4 C) 98.4 F (36.9 C)  TempSrc: Oral Oral Oral Oral    SpO2: 100% 95% 98% 97%  Weight:  52.6 kg    Height:        Intake/Output Summary (Last 24 hours) at 03/02/2020 1452 Last data filed at 03/02/2020 1431 Gross per 24 hour  Intake 933.94 ml  Output 200 ml  Net 733.94 ml   Filed Weights   02/29/20 0250 02/29/20 1111 03/02/20 0403  Weight: 51.4 kg 49.9 kg 52.6 kg    Examination:  General exam: Appears calm and comfortable  Respiratory system: Clear to auscultation. Respiratory effort normal. Cardiovascular system: S1 & S2 heard, RRR. No JVD, murmurs, rubs, gallops or clicks. No pedal edema. Gastrointestinal system: Abdomen is nondistended, soft and nontender. No organomegaly or masses felt. Normal bowel sounds heard. Central nervous system: Alert and oriented. No focal neurological deficits. Extremities: Symmetric 5 x 5 power. Skin: No rashes, lesions or ulcers Psychiatry: Judgement and insight appear normal. Mood & affect appropriate.     Data Reviewed: I have personally reviewed following labs and imaging studies  CBC: Recent Labs  Lab 02/28/20 1916 03/01/20 0611 03/01/20 1847 03/02/20 0624 03/02/20 1301  WBC 12.1* 11.7*  --  9.4  --   HGB 12.9 8.9* 9.9* 7.8* 8.1*  HCT 38.5 26.6*  --  23.5*  --   MCV 89.3 89.9  --  90.0  --   PLT 342 259  --  269  --    Basic Metabolic Panel: Recent Labs  Lab 02/28/20 1916 02/29/20 0912 03/01/20 0611 03/02/20 0624  NA 132* 132* 135 137  K 3.3* 3.1* 3.5 3.8  CL 92* 97* 103 107  CO2 23 22 21* 22  GLUCOSE 96 86 86 100*  BUN 14 10 7* 6*  CREATININE 0.92 0.79 0.68 0.74  CALCIUM 8.9 8.2* 7.7* 8.0*  MG  --   --  1.6*  --    GFR: Estimated Creatinine Clearance: 43 mL/min (by C-G formula based on SCr of 0.74 mg/dL). Liver Function Tests: Recent Labs  Lab 02/28/20 1916 02/29/20 0912  AST 15 13*  ALT 9 8  ALKPHOS 67 63  BILITOT 1.3* 0.9  PROT 7.8 7.0  ALBUMIN 3.3* 2.9*   Recent Labs  Lab 02/28/20 1916  LIPASE 23   No results for input(s): AMMONIA in the last 168  hours. Coagulation Profile: Recent Labs  Lab 02/29/20 0008  INR 1.1   Cardiac Enzymes: No results for input(s): CKTOTAL, CKMB, CKMBINDEX, TROPONINI in the last 168 hours. BNP (last 3 results) No results for input(s): PROBNP in the last 8760 hours. HbA1C: No results for input(s): HGBA1C in the last 72 hours. CBG: No results for input(s): GLUCAP in the last 168 hours. Lipid Profile: No results for input(s): CHOL, HDL, LDLCALC, TRIG, CHOLHDL, LDLDIRECT in the last 72 hours. Thyroid Function Tests: Recent Labs    03/02/20 0624  TSH 3.185   Anemia Panel: Recent Labs    03/02/20 0624  VITAMINB12 4,403*  FERRITIN 204  TIBC 169*  IRON 13*   Sepsis Labs: Recent Labs  Lab 02/28/20 0008 03/01/20 0611 03/02/20 0624  PROCALCITON  --  0.18 0.13  LATICACIDVEN 1.6  --   --     Recent Results (from the past 240 hour(s))  Urine Culture     Status: Abnormal   Collection Time: 02/28/20  7:16 PM   Specimen: Urine, Random  Result Value Ref Range Status   Specimen Description   Final    URINE, RANDOM Performed at [facility], [address] [place], [place] [address]    Special Requests   Final    NONE Performed at [facility], [place] [address] [place], [place] [address]    Culture MULTIPLE SPECIES PRESENT, SUGGEST RECOLLECTION (A)  Final   Report Status 03/01/2020 FINAL  Final  SARS Coronavirus 2 by RT PCR (hospital order, performed in [facility] hospital lab) Nasopharyngeal Nasopharyngeal Swab     Status: None   Collection Time: 02/29/20 12:08 AM   Specimen: Nasopharyngeal Swab  Result Value Ref Range Status   SARS Coronavirus 2 NEGATIVE NEGATIVE Final    Comment: (NOTE) SARS-CoV-2 target nucleic acids are NOT DETECTED.  The SARS-CoV-2 RNA is generally detectable in upper and lower respiratory specimens during the acute phase of infection. The lowest concentration of SARS-CoV-2 viral copies this assay can detect is 250 copies / mL. A negative result  does not preclude SARS-CoV-2 infection and should not be used as the sole basis for treatment or other patient management decisions.  A negative result may occur with improper specimen collection / handling, submission of specimen other than nasopharyngeal swab, presence of viral mutation(s) within the areas targeted by this assay, and inadequate number of viral copies (<250 copies / mL). A negative result must be combined with clinical observations, patient history, and epidemiological information.  Fact Sheet for Patients:   StrictlyIdeas.no  Fact Sheet for Healthcare Providers: BankingDealers.co.za  This test is not yet approved or  cleared by the [place] FDA and has been authorized for detection and/or diagnosis of SARS-CoV-2 by FDA under an Emergency Use Authorization (EUA).  This EUA will remain in effect (meaning this test can be used) for the duration of the COVID-19 declaration under Section 564(b)(1) of the Act, 21 U.S.C. section 360bbb-3(b)(1), unless the authorization is terminated or revoked sooner.  Performed at [facility], [address] [place], [place] [address]   Blood culture (routine x 2)     Status: Abnormal   Collection Time: 02/29/20 12:08 AM   Specimen: BLOOD  Result Value Ref Range Status   Specimen Description   Final    BLOOD RIGHT [facility] Performed at [facility], [address] [place], [place] [address]    Special Requests   Final    BOTTLES DRAWN AEROBIC AND ANAEROBIC Blood Culture adequate volume Performed at [facility], [address] [place], [place] [address]    Culture  Setup Time   Final    GRAM POSITIVE COCCI AEROBIC BOTTLE ONLY CRITICAL VALUE NOTED.  VALUE IS CONSISTENT WITH PREVIOUSLY REPORTED AND CALLED VALUE. Performed at [facility], [address] [place], [place] [address]    Culture (A)  Final    ROTHIA MUCILAGINOSA Standardized  susceptibility testing for this organism is not available. Performed at [facility], [place] [address], [place],  [address]    Report Status 03/02/2020 FINAL  Final  Blood culture (routine x 2)     Status:  Abnormal   Collection Time: 02/29/20 12:08 AM   Specimen: BLOOD  Result Value Ref Range Status   Specimen Description   Final    BLOOD LEFT [facility] Performed at [facility], [address] [place], [place] [address]    Special Requests   Final    BOTTLES DRAWN AEROBIC AND ANAEROBIC Blood Culture adequate volume Performed at [facility], [address] [place], [place] [address]    Culture  Setup Time   Final    GRAM POSITIVE COCCI ANAEROBIC BOTTLE ONLY CRITICAL RESULT CALLED TO, READ BACK BY AND VERIFIED WITH: ASAJAH DUNCAN 02/29/20 AT 1702 BY ACR    Culture (A)  Final    STAPHYLOCOCCUS WARNERI THE SIGNIFICANCE OF ISOLATING THIS ORGANISM FROM A SINGLE SET OF BLOOD CULTURES WHEN MULTIPLE SETS ARE DRAWN IS UNCERTAIN. PLEASE NOTIFY THE MICROBIOLOGY DEPARTMENT WITHIN ONE WEEK IF SPECIATION AND SENSITIVITIES ARE REQUIRED. Performed at [facility], [place] [address] [place],  [address]    Report Status 03/02/2020 FINAL  Final  Blood Culture ID Panel (Reflexed)     Status: Abnormal   Collection Time: 02/29/20 12:08 AM  Result Value Ref Range Status   Enterococcus faecalis NOT DETECTED NOT DETECTED Final   Enterococcus Faecium NOT DETECTED NOT DETECTED Final   Listeria monocytogenes NOT DETECTED NOT DETECTED Final   Staphylococcus species DETECTED (A) NOT DETECTED Final    Comment: CRITICAL RESULT CALLED TO, READ BACK BY AND VERIFIED WITH: ASAJAH DUNCAN 02/29/20 AT 1702 BY ACR    Staphylococcus aureus (BCID) NOT DETECTED NOT DETECTED Final   Staphylococcus epidermidis NOT DETECTED NOT DETECTED Final   Staphylococcus lugdunensis NOT DETECTED NOT DETECTED Final   Streptococcus species NOT DETECTED NOT DETECTED Final   Streptococcus agalactiae  NOT DETECTED NOT DETECTED Final   Streptococcus pneumoniae NOT DETECTED NOT DETECTED Final   Streptococcus pyogenes NOT DETECTED NOT DETECTED Final   A.calcoaceticus-baumannii NOT DETECTED NOT DETECTED Final   Bacteroides fragilis NOT DETECTED NOT DETECTED Final   Enterobacterales NOT DETECTED NOT DETECTED Final   Enterobacter cloacae complex NOT DETECTED NOT DETECTED Final   Escherichia coli NOT DETECTED NOT DETECTED Final   Klebsiella aerogenes NOT DETECTED NOT DETECTED Final   Klebsiella oxytoca NOT DETECTED NOT DETECTED Final   Klebsiella pneumoniae NOT DETECTED NOT DETECTED Final   Proteus species NOT DETECTED NOT DETECTED Final   Salmonella species NOT DETECTED NOT DETECTED Final   Serratia marcescens NOT DETECTED NOT DETECTED Final   Haemophilus influenzae NOT DETECTED NOT DETECTED Final   Neisseria meningitidis NOT DETECTED NOT DETECTED Final   Pseudomonas aeruginosa NOT DETECTED NOT DETECTED Final   Stenotrophomonas maltophilia NOT DETECTED NOT DETECTED Final   Candida albicans NOT DETECTED NOT DETECTED Final   Candida auris NOT DETECTED NOT DETECTED Final   Candida glabrata NOT DETECTED NOT DETECTED Final   Candida krusei NOT DETECTED NOT DETECTED Final   Candida parapsilosis NOT DETECTED NOT DETECTED Final   Candida tropicalis NOT DETECTED NOT DETECTED Final   Cryptococcus neoformans/gattii NOT DETECTED NOT DETECTED Final    Comment: Performed at [facility], [address], [place],  [address]         Radiology Studies: No results found.      Scheduled Meds: . buPROPion  150 mg Oral Daily  . feeding supplement (ENSURE ENLIVE)  237 mL Oral BID BM  . influenza vaccine adjuvanted  0.5 mL Intramuscular Tomorrow-1000  . multivitamin with minerals  1 tablet Oral Daily  .  polyethylene glycol  17 g Oral Daily  . potassium chloride  40 mEq Oral BID  . sodium chloride flush  10-40 mL Intracatheter Q12H   Continuous Infusions: . sodium chloride 250  mL (03/01/20 2038)  . azithromycin 500 mg (03/01/20 2210)  . heparin 1,100 Units/hr (03/02/20 1127)  . iron sucrose 300 mg (03/02/20 1344)  . vancomycin 1,000 mg (03/01/20 2040)     LOS: 2 days    Time spent: 28 minutes    Sharen Hones, MD [facility]   To contact the attending provider between 7A-7P or the covering provider during after hours 7P-7A, please log into the web site [email or web address] and access using universal [place] password for that web site. If you do not have the password, please call the hospital operator.  03/02/2020, 2:52 PM

## 2020-03-02 NOTE — Consult Note (Signed)
NAME: Patricia Miller  DOB: [birth year]  MRN: [id number]  Date/Time: 03/02/2020 5:56 PM  REQUESTING PROVIDER: zhang Subjective:  REASON FOR CONSULT: bacteremia ? Patricia Miller is a 74 y.o. female  with a history of HTN recent lap hysterectomy July 2021 was admitted on 9/189 with back pain and abdominal pain.she did not have any fever, chills.  She did not have any nausea, vomiting and diarrhea. Started on ceftriaxone + zithromax Vitals were temp of 97.8, P 139/86, HR 106, pulse ox 99% CT abd/pelvis revealed PE.  Blood cultures sent . On 9/20 she underwent Contrast injection right heart             2.  Thrombolysis right pulmonary arteries             3.  Mechanical thrombectomy right main, middle, and lower lobe pulmonary arteries             4.  Selective catheter placement right middle and lower lobe pulmonary arteries             5.  Selective catheter placement left upper and lower lobe pulmonary arteries             6.  IVC filter placement             7.  Left iliofemoral venogram             8.  Mechanical thrombectomy to left common femoral and proximal superficial femoral veins with the penumbra CAT 12 device  9/21 blood culture positive for staph warneri left AC and rothia mucilaginosa rt AC. She has been started on vanco and I am asked to see the patient for the same Pt denies fever, cough, diarrhea, vomiting, tooth extraction  Past Medical History:  Diagnosis Date  . Anxiety   . Arthritis    NECK  . Cancer ([place])    BASAL CELL AND MELANOMA  . DAVF (dural arteriovenous fistula) 2015  . Hypertension     Past Surgical History:  Procedure Laterality Date  . BRAIN SURGERY  2015   DURAL AV FISTULA ([place] [facility])  . COLONOSCOPY    . COLONOSCOPY WITH PROPOFOL N/A 02/20/2017   Procedure: COLONOSCOPY WITH PROPOFOL;  Surgeon: Manya Silvas, MD;  Location: [facility] ENDOSCOPY;  Service: Endoscopy;  Laterality: N/A;  . HYSTEROSCOPY WITH D & C N/A 07/29/2015   Procedure:  DILATATION AND CURETTAGE /HYSTEROSCOPY;  Surgeon: Benjaman Kindler, MD;  Location: [facility] ORS;  Service: Gynecology;  Laterality: N/A;  . HYSTEROSCOPY WITH D & C N/A 01/24/2018   Procedure: DILATATION AND CURETTAGE /HYSTEROSCOPY;  Surgeon: Benjaman Kindler, MD;  Location: [facility] ORS;  Service: Gynecology;  Laterality: N/A;  . LAPAROSCOPY N/A 01/24/2018   Procedure: LAPAROSCOPY DIAGNOSTIC;  Surgeon: Benjaman Kindler, MD;  Location: [facility] ORS;  Service: Gynecology;  Laterality: N/A;  . PERIPHERAL VASCULAR THROMBECTOMY N/A 02/29/2020   Procedure: PERIPHERAL VASCULAR THROMBECTOMY / THROMBOLYSIS WITH POSSIBLE PULMONARY THROMBECTOMY / THROMBOLYSIS;  Surgeon: Algernon Huxley, MD;  Location: [place] [facility] LAB;  Service: Cardiovascular;  Laterality: N/A;  . TONSILLECTOMY     AGE 19  . TOTAL LAPAROSCOPIC HYSTERECTOMY WITH BILATERAL SALPINGO OOPHORECTOMY Bilateral 12/28/2019   Procedure: TOTAL LAPAROSCOPIC HYSTERECTOMY WITH BILATERAL SALPINGO OOPHORECTOMY;  Surgeon: Benjaman Kindler, MD;  Location: [facility] ORS;  Service: Gynecology;  Laterality: Bilateral;  . TUBAL LIGATION      Social History   Socioeconomic History  . Marital status: Divorced    Spouse name: Not on file  . Number  of children: Not on file  . Years of education: Not on file  . Highest education level: Not on file  Occupational History  . Not on file  Tobacco Use  . Smoking status: Former Smoker    Packs/day: 0.50    Years: 15.00    Pack years: 7.50    Types: Cigarettes    Quit date: 07/21/2000    Years since quitting: 19.6  . Smokeless tobacco: Never Used  Vaping Use  . Vaping Use: Never used  Substance and Sexual Activity  . Alcohol use: Yes    Comment: RARE WINE MONTHLY  . Drug use: No  . Sexual activity: Not Currently  Other Topics Concern  . Not on file  Social History Narrative  . Not on file   Social Determinants of Health   Financial Resource Strain:   . Difficulty of Paying Living Expenses: Not on file  Food  Insecurity:   . Worried About Charity fundraiser in the Last Year: Not on file  . Ran Out of Food in the Last Year: Not on file  Transportation Needs:   . Lack of Transportation (Medical): Not on file  . Lack of Transportation (Non-Medical): Not on file  Physical Activity:   . Days of Exercise per Week: Not on file  . Minutes of Exercise per Session: Not on file  Stress:   . Feeling of Stress : Not on file  Social Connections:   . Frequency of Communication with Friends and Family: Not on file  . Frequency of Social Gatherings with Friends and Family: Not on file  . Attends Religious Services: Not on file  . Active Member of Clubs or Organizations: Not on file  . Attends Archivist Meetings: Not on file  . Marital Status: Not on file  Intimate Partner Violence:   . Fear of Current or Ex-Partner: Not on file  . Emotionally Abused: Not on file  . Physically Abused: Not on file  . Sexually Abused: Not on file    History reviewed. No pertinent family history. No Known Allergies  ? Current Facility-Administered Medications  Medication Dose Route Frequency Provider Last Rate Last Admin  . 0.9 %  sodium chloride infusion   Intravenous PRN Loletha Grayer, MD 10 mL/hr at 03/01/20 2038 250 mL at 03/01/20 2038  . apixaban (ELIQUIS) tablet 10 mg  10 mg Oral BID Rauer, Forde Dandy, RPH   10 mg at 03/02/20 1606   Followed by  . [START ON 03/09/2020] apixaban (ELIQUIS) tablet 5 mg  5 mg Oral BID Rauer, Forde Dandy, RPH      . buPROPion (WELLBUTRIN XL) 24 hr tablet 150 mg  150 mg Oral Daily Loletha Grayer, MD   150 mg at 03/02/20 1016  . feeding supplement (ENSURE ENLIVE) (ENSURE ENLIVE) liquid 237 mL  237 mL Oral BID BM Loletha Grayer, MD   237 mL at 03/02/20 1017  . influenza vaccine adjuvanted (FLUAD) injection 0.5 mL  0.5 mL Intramuscular Tomorrow-1000 Wieting, Richard, MD      . multivitamin with minerals tablet 1 tablet  1 tablet Oral Daily Loletha Grayer, MD   1 tablet at  03/02/20 1016  . ondansetron (ZOFRAN) injection 4 mg  4 mg Intravenous Q6H PRN Algernon Huxley, MD      . oxyCODONE (Oxy IR/ROXICODONE) immediate release tablet 5 mg  5 mg Oral Q6H PRN Algernon Huxley, MD   5 mg at 03/02/20 1016  . polyethylene glycol (MIRALAX /  GLYCOLAX) packet 17 g  17 g Oral Daily Loletha Grayer, MD   17 g at 03/02/20 1017  . potassium chloride (KLOR-CON) packet 40 mEq  40 mEq Oral BID Sharen Hones, MD   40 mEq at 03/02/20 1606  . sodium chloride flush (NS) 0.9 % injection 10-40 mL  10-40 mL Intracatheter Q12H Algernon Huxley, MD   10 mL at 03/02/20 1019  . sodium chloride flush (NS) 0.9 % injection 10-40 mL  10-40 mL Intracatheter PRN Algernon Huxley, MD      . traZODone (DESYREL) tablet 50 mg  50 mg Oral QHS PRN Loletha Grayer, MD   50 mg at 03/01/20 2209  . vancomycin (VANCOCIN) IVPB 1000 mg/200 mL premix  1,000 mg Intravenous Q24H Rowland Lathe, RPH 200 mL/hr at 03/01/20 2040 1,000 mg at 03/01/20 2040     Abtx:  Anti-infectives (From admission, onward)   Start     Dose/Rate Route Frequency Ordered Stop   03/01/20 2000  vancomycin (VANCOCIN) IVPB 1000 mg/200 mL premix        1,000 mg 200 mL/hr over 60 Minutes Intravenous Every 24 hours 02/29/20 1805     02/29/20 1900  vancomycin (VANCOREADY) IVPB 1250 mg/250 mL        1,250 mg 166.7 mL/hr over 90 Minutes Intravenous  Once 02/29/20 1735 02/29/20 2149   02/28/20 2330  cefTRIAXone (ROCEPHIN) 2 g in sodium chloride 0.9 % 100 mL IVPB  Status:  Discontinued        2 g 200 mL/hr over 30 Minutes Intravenous Every 24 hours 02/28/20 2316 02/29/20 1735   02/28/20 2330  azithromycin (ZITHROMAX) 500 mg in sodium chloride 0.9 % 250 mL IVPB  Status:  Discontinued        500 mg 250 mL/hr over 60 Minutes Intravenous Every 24 hours 02/28/20 2316 03/02/20 1530      REVIEW OF SYSTEMS:  Const: negative fever, negative chills,  weight loss not sure how much- poor appetite for 2 months Eyes: negative diplopia or visual changes, negative eye  pain ENT: negative coryza, negative sore throat Resp: negative cough, hemoptysis, dyspnea Cards:back pain, pleuritic, palpitations, no lower extremity edema GU: negative for frequency, dysuria and hematuria GI: has  abdominal pain, no diarrhea, bleeding, constipation Skin: negative for rash and pruritus Heme: negative for easy bruising and gum/nose bleeding MS: negative for myalgias, arthralgias, back pain and muscle weakness Neurolo:negative for headaches, dizziness, vertigo, memory problems  Psych: negative for feelings of anxiety, depression  Endocrine: negative for thyroid, diabetes Allergy/Immunology- negative for any medication or food allergies ?  Objective:  VITALS:  BP (!) 131/106 (BP Location: Right Arm)   Pulse (!) 110   Temp 98.4 F (36.9 C) (Oral)   Resp 16   Ht 5' (1.524 m)   Wt 52.6 kg   SpO2 100%   BMI 22.65 kg/m  PHYSICAL EXAM:  General: Alert, cooperative, no distress until she lay back and it was hurting for her to take a deep breath, appears stated age.  Head: Normocephalic, without obvious abnormality, atraumatic. Eyes: Conjunctivae clear, anicteric sclerae. Pupils are equal ENT Nares normal. No drainage or sinus tenderness. Lips, mucosa, and tongue normal. No Thrush Neck: Supple, symmetrical, no adenopathy, thyroid: non tender no carotid bruit and no JVD. Back: No CVA tenderness. Lungs: b/l air entry Heart: Regular rate and rhythm, no murmur, rub or gallop. Abdomen: Soft, non-tender,not distended. Bowel sounds normal. No masses Extremities: atraumatic, no cyanosis. No edema. No clubbing Skin: No  rashes or lesions. Or bruising Lymph: Cervical, supraclavicular normal. Neurologic: Grossly non-focal Pertinent Labs Lab Results CBC    Component Value Date/Time   WBC 9.4 03/02/2020 0624   RBC 2.61 (L) 03/02/2020 0624   HGB 8.1 (L) 03/02/2020 1301   HCT 23.5 (L) 03/02/2020 0624   PLT 269 03/02/2020 0624   MCV 90.0 03/02/2020 0624   MCH 29.9  03/02/2020 0624   MCHC 33.2 03/02/2020 0624   RDW 12.6 03/02/2020 0624    CMP Latest Ref Rng & Units 03/02/2020 03/01/2020 02/29/2020  Glucose 70 - 99 mg/dL 100(H) 86 86  BUN 8 - 23 mg/dL 6(L) 7(L) 10  Creatinine 0.44 - 1.00 mg/dL 0.74 0.68 0.79  Sodium 135 - 145 mmol/L 137 135 132(L)  Potassium 3.5 - 5.1 mmol/L 3.8 3.5 3.1(L)  Chloride 98 - 111 mmol/L 107 103 97(L)  CO2 22 - 32 mmol/L 22 21(L) 22  Calcium 8.9 - 10.3 mg/dL 8.0(L) 7.7(L) 8.2(L)  Total Protein 6.5 - 8.1 g/dL - - 7.0  Total Bilirubin 0.3 - 1.2 mg/dL - - 0.9  Alkaline Phos 38 - 126 U/L - - 63  AST 15 - 41 U/L - - 13(L)  ALT 0 - 44 U/L - - 8      Microbiology: Recent Results (from the past 240 hour(s))  Urine Culture     Status: Abnormal   Collection Time: 02/28/20  7:16 PM   Specimen: Urine, Random  Result Value Ref Range Status   Specimen Description   Final    URINE, RANDOM Performed at [facility], [address] [place], [place] [address]    Special Requests   Final    NONE Performed at [facility], [place] [address] [place], [place] [address]    Culture MULTIPLE SPECIES PRESENT, SUGGEST RECOLLECTION (A)  Final   Report Status 03/01/2020 FINAL  Final  SARS Coronavirus 2 by RT PCR (hospital order, performed in [facility] hospital lab) Nasopharyngeal Nasopharyngeal Swab     Status: None   Collection Time: 02/29/20 12:08 AM   Specimen: Nasopharyngeal Swab  Result Value Ref Range Status   SARS Coronavirus 2 NEGATIVE NEGATIVE Final    Comment: (NOTE) SARS-CoV-2 target nucleic acids are NOT DETECTED.  The SARS-CoV-2 RNA is generally detectable in upper and lower respiratory specimens during the acute phase of infection. The lowest concentration of SARS-CoV-2 viral copies this assay can detect is 250 copies / mL. A negative result does not preclude SARS-CoV-2 infection and should not be used as the sole basis for treatment or other patient management decisions.  A negative result may occur  with improper specimen collection / handling, submission of specimen other than nasopharyngeal swab, presence of viral mutation(s) within the areas targeted by this assay, and inadequate number of viral copies (<250 copies / mL). A negative result must be combined with clinical observations, patient history, and epidemiological information.  Fact Sheet for Patients:   StrictlyIdeas.no  Fact Sheet for Healthcare Providers: BankingDealers.co.za  This test is not yet approved or  cleared by the [place] FDA and has been authorized for detection and/or diagnosis of SARS-CoV-2 by FDA under an Emergency Use Authorization (EUA).  This EUA will remain in effect (meaning this test can be used) for the duration of the COVID-19 declaration under Section 564(b)(1) of the Act, 21 U.S.C. section 360bbb-3(b)(1), unless the authorization is terminated or revoked sooner.  Performed at [facility], [address] [place], [place] [address]  Blood culture (routine x 2)     Status: Abnormal   Collection Time: 02/29/20 12:08 AM   Specimen: BLOOD  Result Value Ref Range Status   Specimen Description   Final    BLOOD RIGHT [facility] Performed at [facility], [address] [place], [place] [address]    Special Requests   Final    BOTTLES DRAWN AEROBIC AND ANAEROBIC Blood Culture adequate volume Performed at [facility], [address] [place], [place] [address]    Culture  Setup Time   Final    GRAM POSITIVE COCCI AEROBIC BOTTLE ONLY CRITICAL VALUE NOTED.  VALUE IS CONSISTENT WITH PREVIOUSLY REPORTED AND CALLED VALUE. Performed at [facility], [address] [place], [place] [address]    Culture (A)  Final    ROTHIA MUCILAGINOSA Standardized susceptibility testing for this organism is not available. Performed at [facility], [place] [address], [place], [place] [address]    Report Status 03/02/2020  FINAL  Final  Blood culture (routine x 2)     Status: Abnormal   Collection Time: 02/29/20 12:08 AM   Specimen: BLOOD  Result Value Ref Range Status   Specimen Description   Final    BLOOD LEFT AC Performed at [facility], [address], [place], [place] [address]    Special Requests   Final    BOTTLES DRAWN AEROBIC AND ANAEROBIC Blood Culture adequate volume Performed at [facility], [address] [place], [place] [address]    Culture  Setup Time   Final    GRAM POSITIVE COCCI ANAEROBIC BOTTLE ONLY CRITICAL RESULT CALLED TO, READ BACK BY AND VERIFIED WITH: ASAJAH DUNCAN 02/29/20 AT 1702 BY ACR    Culture (A)  Final    STAPHYLOCOCCUS WARNERI THE SIGNIFICANCE OF ISOLATING THIS ORGANISM FROM A SINGLE SET OF BLOOD CULTURES WHEN MULTIPLE SETS ARE DRAWN IS UNCERTAIN. PLEASE NOTIFY THE MICROBIOLOGY DEPARTMENT WITHIN ONE WEEK IF SPECIATION AND SENSITIVITIES ARE REQUIRED. Performed at [facility], [place] [address] [place], [place] [address]    Report Status 03/02/2020 FINAL  Final  Blood Culture ID Panel (Reflexed)     Status: Abnormal   Collection Time: 02/29/20 12:08 AM  Result Value Ref Range Status   Enterococcus faecalis NOT DETECTED NOT DETECTED Final   Enterococcus Faecium NOT DETECTED NOT DETECTED Final   Listeria monocytogenes NOT DETECTED NOT DETECTED Final   Staphylococcus species DETECTED (A) NOT DETECTED Final    Comment: CRITICAL RESULT CALLED TO, READ BACK BY AND VERIFIED WITH: ASAJAH DUNCAN 02/29/20 AT 1702 BY ACR    Staphylococcus aureus (BCID) NOT DETECTED NOT DETECTED Final   Staphylococcus epidermidis NOT DETECTED NOT DETECTED Final   Staphylococcus lugdunensis NOT DETECTED NOT DETECTED Final   Streptococcus species NOT DETECTED NOT DETECTED Final   Streptococcus agalactiae NOT DETECTED NOT DETECTED Final   Streptococcus pneumoniae NOT DETECTED NOT DETECTED Final   Streptococcus pyogenes NOT DETECTED NOT DETECTED Final    A.calcoaceticus-baumannii NOT DETECTED NOT DETECTED Final   Bacteroides fragilis NOT DETECTED NOT DETECTED Final   Enterobacterales NOT DETECTED NOT DETECTED Final   Enterobacter cloacae complex NOT DETECTED NOT DETECTED Final   Escherichia coli NOT DETECTED NOT DETECTED Final   Klebsiella aerogenes NOT DETECTED NOT DETECTED Final   Klebsiella oxytoca NOT DETECTED NOT DETECTED Final   Klebsiella pneumoniae NOT DETECTED NOT DETECTED Final   Proteus species NOT DETECTED NOT DETECTED Final   Salmonella species NOT DETECTED NOT DETECTED Final  Serratia marcescens NOT DETECTED NOT DETECTED Final   Haemophilus influenzae NOT DETECTED NOT DETECTED Final   Neisseria meningitidis NOT DETECTED NOT DETECTED Final   Pseudomonas aeruginosa NOT DETECTED NOT DETECTED Final   Stenotrophomonas maltophilia NOT DETECTED NOT DETECTED Final   Candida albicans NOT DETECTED NOT DETECTED Final   Candida auris NOT DETECTED NOT DETECTED Final   Candida glabrata NOT DETECTED NOT DETECTED Final   Candida krusei NOT DETECTED NOT DETECTED Final   Candida parapsilosis NOT DETECTED NOT DETECTED Final   Candida tropicalis NOT DETECTED NOT DETECTED Final   Cryptococcus neoformans/gattii NOT DETECTED NOT DETECTED Final    Comment: Performed at [facility], [address] [place], [place] [address]    IMAGING RESULTS: CT abdomen and pelvis Marked severity pulmonary embolism involving the right pulmonary artery and several of its middle lobe and lower lobe branches. 2. Mild to moderate severity multifocal infiltrates within the right lower lobe, consistent with pneumonia. I have personally reviewed the films Moderate amount of DVT within the left common femoral vein ?Mildly thickened small bowel loops within the lower abdomen which may represent mild enteritis.Impression/Recommendation ? ? ?Impression/recommendation Admitted with back apin and abdominal pain Diagnosed with left DVT and extensive  PE S/p thrombectomy of the pulmonary arteries and left femoral vein- on anticoagulation Unclear etiology for DVT- ? Provoked- was on HRT Recent lap hysterectomy  2 months ago but was not immobile R/o malignancy  Rothia muclioginosa and staph warneri- 1/4 - on Clincal examination, history  there is no evidence of infection, CT abdomen showed small bowel inflammation? Enteritis- is this the source-?/ less likely  Repeat cultures have been sent-  Continue vancomycin for now and observe   ___________________________________________________ Discussed with patient, requesting provider Note:  This document was prepared using Dragon voice recognition software and may include unintentional dictation errors.

## 2020-03-02 NOTE — Consult Note (Addendum)
Pharmacy Antibiotic Note  Patricia Miller is a 74 y.o. female admitted on 02/28/2020 with bacteremia. Patient received one time dose of CTX and loading dose 1250mg  IV vancomycin. Bcx 2/4 bottles (anaerobic and aerobic; 2 different sets) GPC staph species now identified as staph warneri; also growing rothia mucilaginosa; results could reflect contamination. Pharmacy has been consulted for Vancomycin dosing.  Day 4 total IV abx, WBC 11.7>9.4, afebrile, Scr <1  Plan: Dosing nomogram indicates Vancomycin 500mg  q12h however, will continue Vancomycin 1000 mg IV Q24H since same total daily dose Monitor renal function and adjust as clinically indicated Obtain vanc level prior to 4th or 5th dose  Height: 5' (152.4 cm) Weight: 52.6 kg (116 lb) IBW/kg (Calculated) : 45.5  Temp (24hrs), Avg:98.8 F (37.1 C), Min:97.6 F (36.4 C), Max:99.8 F (37.7 C)  Recent Labs  Lab 02/28/20 0008 02/28/20 1916 02/29/20 0912 03/01/20 0611 03/02/20 0624  WBC  --  12.1*  --  11.7* 9.4  CREATININE  --  0.92 0.79 0.68 0.74  LATICACIDVEN 1.6  --   --   --   --     Estimated Creatinine Clearance: 43 mL/min (by C-G formula based on SCr of 0.74 mg/dL).    No Known Allergies  Antimicrobials this admission: 9/19 CTX x1 9/19 azithromycin >> 9/20 vancomycin >>  Dose adjustments this admission: N/A  Microbiology results: 9/20 BCx: staph warneri, rothia mucilaginosa 9/19 UCx: multiple species, suggest recollection  Thank you for allowing pharmacy to be a part of this patient's care.  Sherilyn Banker, PharmD Pharmacy Resident  03/02/2020 9:12 AM

## 2020-03-02 NOTE — Evaluation (Signed)
Physical Therapy Evaluation Patient Details Name: Patricia Miller MRN: [id number] DOB: 27-Jan-1946 Today's Date: 03/02/2020   History of Present Illness  Patricia Miller is a 74 y/o female who was admitted for history of dull achy R upper and lower mod-severe intensity, non-radiating abdominal pain x several days. Pt underwent R pulmonary thrombectomy, IVC filter placement, and L common femoral thrombectomy on 9/20. PMH includes HTN, basal cell carcinoma, hx of tubular adenoma, s/p laparascopic hysterectomy 12/28/2019 on HRTs, dural anteriovenous fistula, and anxiety.  Clinical Impression  Pt received in bed with HOB elevated finishing lunch. Pt agreeable to PT evaluation stating that she has not been out of bed that much. Pt with slight generalized weakness in BUE and BLE. Pt deferred ambulation in hallway secondary to being hooked up to IV pole however was agreeable to ambulate within room initially with supervision then pt ambulated to bathroom pushing IV pole herself. Pt noted to converse, turn head, and navigate obstacles within room without LOB. Pt declined performing therex with pt and states that her balance feels not 100% back to baseline but she feels steady on her feet and does not anticipate any PT needs. Will d/c patient from caseload and complete order secondary to patient requiring supervision or less for functional mobility.    Follow Up Recommendations No PT follow up    Equipment Recommendations  None recommended by PT    Recommendations for Other Services       Precautions / Restrictions Precautions Precautions: Fall Restrictions Weight Bearing Restrictions: No      Mobility  Bed Mobility Overal bed mobility: Modified Independent             General bed mobility comments: Required increased time to perform however required no external physical assistance.  Transfers Overall transfer level: Independent Equipment used: None             General transfer  comment: Pt independent with sit <> stand transfers from bed, recliner chair, and low commode in bathroom.  Ambulation/Gait Ambulation/Gait assistance: Supervision Gait Distance (Feet): 50 Feet Assistive device: None;IV Pole Gait Pattern/deviations: Step-through pattern Gait velocity: slightly decreased   General Gait Details: Pt deferred ambulating in hallway secondary to having IV pole. Pt ambulated in room initially with clinician pushing IV pole with supervision for safety. Pt then ambulated to bathroom (in room) pushing IV pole. No noted LOB.  Stairs            Wheelchair Mobility    Modified Rankin (Stroke Patients Only)       Balance Overall balance assessment: No apparent balance deficits (not formally assessed)                                           Pertinent Vitals/Pain Pain Assessment: Faces Faces Pain Scale: Hurts a little bit Pain Location: LUE (deltoid - where received injection) Pain Descriptors / Indicators: Discomfort;Aching Pain Intervention(s): Monitored during session    Home Living Family/patient expects to be discharged to:: Private residence Living Arrangements: Children Available Help at Discharge: Family;Available PRN/intermittently Type of Home: House Home Access: Level entry     Home Layout: Two level;Able to live on main level with bedroom/bathroom Home Equipment: Grab bars - tub/shower      Prior Function Level of Independence: Independent         Comments: Pt reports PLOF as independent with ambulation and transfers  and denies usage of AD. Pt reports being very active, (+) driver, and denies any fall history in last 6 months.     Hand Dominance        Extremity/Trunk Assessment   Upper Extremity Assessment Upper Extremity Assessment: Overall WFL for tasks assessed;Generalized weakness (grossly 4/5 bilaterally)    Lower Extremity Assessment Lower Extremity Assessment: Overall WFL for tasks  assessed;Generalized weakness (grossly 4/5 bilaterally)    Cervical / Trunk Assessment Cervical / Trunk Assessment: Normal  Communication   Communication: No difficulties  Cognition Arousal/Alertness: Awake/alert Behavior During Therapy: WFL for tasks assessed/performed Overall Cognitive Status: Within Functional Limits for tasks assessed                                 General Comments: Pt alert and oriented x 4      General Comments      Exercises Other Exercises Other Exercises: pt ambulated to and from bathroom initially with supervision then pushing IV pole mod I; pt able to perform self care independently   Assessment/Plan    PT Assessment Patent does not need any further PT services  PT Problem List         PT Treatment Interventions      PT Goals (Current goals can be found in the Care Plan section)  Acute Rehab PT Goals Patient Stated Goal: to go home PT Goal Formulation: With patient Time For Goal Achievement: 03/02/20 Potential to Achieve Goals: Good    Frequency     Barriers to discharge        Co-evaluation               AM-PAC PT "6 Clicks" Mobility  Outcome Measure Help needed turning from your back to your side while in a flat bed without using bedrails?: None Help needed moving from lying on your back to sitting on the side of a flat bed without using bedrails?: A Little Help needed moving to and from a bed to a chair (including a wheelchair)?: None Help needed standing up from a chair using your arms (e.g., wheelchair or bedside chair)?: None Help needed to walk in hospital room?: None Help needed climbing 3-5 steps with a railing? : A Little 6 Click Score: 22    End of Session Equipment Utilized During Treatment: Gait belt Activity Tolerance: Patient tolerated treatment well Patient left: in chair;with call bell/phone within reach;with chair alarm set Nurse Communication: Mobility status PT Visit Diagnosis: Muscle  weakness (generalized) (M62.81);Pain Pain - Right/Left: Left Pain - part of body: Arm    Time: 6767-2094 PT Time Calculation (min) (ACUTE ONLY): 26 min   Charges:              Vale Haven, SPT  Vale Haven 03/02/2020, 4:30 PM

## 2020-03-03 LAB — BASIC METABOLIC PANEL
Anion gap: 8 (ref 5–15)
BUN: 5 mg/dL — ABNORMAL LOW (ref 8–23)
CO2: 21 mmol/L — ABNORMAL LOW (ref 22–32)
Calcium: 8.4 mg/dL — ABNORMAL LOW (ref 8.9–10.3)
Chloride: 110 mmol/L (ref 98–111)
Creatinine, Ser: 0.73 mg/dL (ref 0.44–1.00)
GFR calc Af Amer: >60 mL/min (ref >60)
GFR calc non Af Amer: >60 mL/min (ref >60)
Glucose, Bld: 86 mg/dL (ref 70–99)
Potassium: 4.3 mmol/L (ref 3.5–5.1)
Sodium: 139 mmol/L (ref 135–145)

## 2020-03-03 LAB — CBC
HCT: 23.7 % — ABNORMAL LOW (ref 36.0–46.0)
Hemoglobin: 8.1 g/dL — ABNORMAL LOW (ref 12.0–15.0)
MCH: 30 pg (ref 26.0–34.0)
MCHC: 34.2 g/dL (ref 30.0–36.0)
MCV: 87.8 fL (ref 80.0–100.0)
Platelets: 316 10*3/uL (ref 150–400)
RBC: 2.7 MIL/uL — ABNORMAL LOW (ref 3.87–5.11)
RDW: 12.8 % (ref 11.5–15.5)
WBC: 7.3 10*3/uL (ref 4.0–10.5)
nRBC: 0 % (ref 0.0–0.2)

## 2020-03-03 LAB — T.PALLIDUM AB, TOTAL: T Pallidum Abs: NONREACTIVE

## 2020-03-03 LAB — MAGNESIUM: Magnesium: 2.1 mg/dL (ref 1.7–2.4)

## 2020-03-03 NOTE — Progress Notes (Signed)
Patient states she believes she has received the flu vaccine while she has been at hospital this admission. Nothing documented with MAR. I held vaccine today due to lack of certainty of administration prior to my shift. Patient is contacting her niece for paperwork reference flu vaccine.

## 2020-03-03 NOTE — Progress Notes (Signed)
Mobility Specialist - Progress Note   03/03/20 1600  Mobility  Activity Ambulated in room  Level of Assistance Independent  Assistive Device None  Distance Ambulated (ft) 100 ft  Mobility Response Tolerated well  Mobility performed by Mobility specialist  $Mobility charge 1 Mobility    Pre-mobility: 100 HR, 123/72 BP, 100% SpO2 Post-mobility: 109 HR, 139/76 BP, 100% SpO2   Pt was sitting in bed upon arrival. Pt agreed to session. Pt was alert, oriented, and very pleasant throughout session. Pt c/o fatigue "7/10" but was not limited for mobility. Pt was independent in both STS and while ambulating. No AD was used for this session. Pt denied any SOB. Noted LOB 1x. Pt denied weakness or dizziness. Pt stated this is her "first time getting up since being admitted". Overall, pt tolerated session well. Pt was left in bed with all needs in reach.    Kathee Delton Mobility Specialist 03/03/20, 4:42 PM

## 2020-03-03 NOTE — Consult Note (Signed)
Pharmacy Antibiotic Note  Patricia Miller is a 74 y.o. female admitted on 02/28/2020 with bacteremia. Patient received one time dose of CTX and loading dose 1250mg  IV vancomycin. Bcx 2/4 bottles (anaerobic and aerobic; 2 different sets) GPC staph species now identified as staph warneri; also growing rothia mucilaginosa; results could reflect contamination. Pharmacy has been consulted for Vancomycin dosing.  Day 4 total IV abx, WBC 11.7>9.4>7.3, afebrile, Scr <1 Vancomycin dose on 9/22 given ~4 hours late (administered 9/23 @0009 ) ID is following and would like to continue vanc and observe for now. Susceptibilities for Rothia mucilanginosa pending.  Plan: Continue Vancomycin 1000 mg IV Q24H  Monitor renal function and adjust as clinically indicated If continued, obtain vanc level prior to 4th or 5th dose  Height: 5' (152.4 cm) Weight: 52 kg (114 lb 9.6 oz) IBW/kg (Calculated) : 45.5  Temp (24hrs), Avg:98.5 F (36.9 C), Min:98 F (36.7 C), Max:98.8 F (37.1 C)  Recent Labs  Lab 02/28/20 0008 02/28/20 1916 02/29/20 0912 03/01/20 0611 03/02/20 0624 03/03/20 0510  WBC  --  12.1*  --  11.7* 9.4 7.3  CREATININE  --  0.92 0.79 0.68 0.74 0.73  LATICACIDVEN 1.6  --   --   --   --   --     Estimated Creatinine Clearance: 43 mL/min (by C-G formula based on SCr of 0.73 mg/dL).    No Known Allergies  Antimicrobials this admission: 9/20 CTX x1 9/20 azithromycin >> 9/20 vancomycin >>  Dose adjustments this admission: N/A  Microbiology results: 9/22 Bcx: NGTD  9/20 BCx: staph warneri, rothia mucilaginosa 9/19 UCx: multiple species, suggest recollection  Thank you for allowing pharmacy to be a part of this patient's care.  Sherilyn Banker, PharmD Pharmacy Resident  03/03/2020 9:14 AM

## 2020-03-03 NOTE — Progress Notes (Signed)
Patient resting comfortably in bed during assessment. Appears calm and cooperative. No reports of pain at this time. Will continue to assess patient.

## 2020-03-03 NOTE — Progress Notes (Signed)
PROGRESS NOTE    Patricia Miller  LKG:[id number] DOB: 02/12/1946 DOA: 02/28/2020 PCP: Rusty Aus, MD   Chief complaint.  Chest pain  Brief Narrative:  Patricia Lucking Safewrightis a 74 y.o.femalewith medical history significant forHTN, basal cell carcinoma, history of tubular adenoma, status post laparoscopic hysterectomy 12/28/2019 on HRT's, who presents to the emergency room with a several day history of dull achy right upper and lower abdominal pain, nonradiating, of moderate to severe intensity now associated with shortness of breath. CT scan showed marked severity PE involving right pulmonary artery and several is middle lobe and the lower lobe branches.  Also showed mild to moderate multifocal infiltrates with the right lower lobe consistent with pneumonia.  Duplex ultrasound also showed DVT in the left common femoral vein.  9/20.  Patient was seen by vascular surgery, received thrombolysis in her right pulmonary artery, mechanical thrombectomy in the right main, middle and lower lobe pulmonary arteries, IVC filter, and mechanical thrombectomy to the left common femoral and proximal superficial femoral veins.  Patient is placed on heparin drip.  9/21.  Patient was placed on antibiotics with Zithromax and vancomycin for positive blood culture with Rothia Mucilaginosa.   9/22.  Hemoglobin stable.  Anticoagulation changed to Eliquis.  Consulted ID for bacteremia.    Assessment & Plan:   Active Problems:   Acute pulmonary embolism (HCC)   Acute deep vein thrombosis (DVT) of femoral vein of left lower extremity (HCC)   RLL pneumonia   Pulmonary nodule 1 cm or greater in diameter   Hormone replacement therapy (HRT)   History of squamous cell carcinoma   S/P laparoscopic hysterectomy 12/2019    Hx of Tubular adenoma   HTN (hypertension)   Hypokalemia   Memory loss   Acute blood loss anemia  #1.  Acute pulmonary emboli and the left lower extremity DVT. Status post vascular  procedures and IVC filter. Continue Eliquis. Right side of the pneumonia most likely due to pulmonary emboli, not likely from infection.  2.  Acute blood loss anemia with iron deficient anemia. Status post IV iron.  Hemoglobin has been stable now.  3.  Rothia Mucilaginosa bacteremia. Pending susceptibility from the original culture.  Appreciated ID consult.  Will decide tomorrow if this is a contaminant or this is a true infection.  4.  Hypokalemia. Improved.  5.  Right side pneumonia.  Most likely due to pulmonary emboli.  6.  Pulmonology on CT scan.  Follow-up as outpatient.    DVT prophylaxis: Eliquis Code Status: Full Family Communication: None  .   Status is: Inpatient  Remains inpatient appropriate because:Inpatient level of care appropriate due to severity of illness Still on IV vancomycin pending final culture results.  Dispo: The patient is from: Home              Anticipated d/c is to: Home              Anticipated d/c date is: 1 day              Patient currently is not medically stable to d/c.        I/O last 3 completed shifts: In: 933.9 [P.O.:240; I.V.:243.9; IV Piggyback:450] Out: 1150 [Urine:1150] Total I/O In: 74 [P.O.:720] Out: 350 [Urine:350]     Consultants:   Vascular, ID  Procedures: Vascular surgery  Antimicrobials:  Vancomycin  Subjective: Patient still complaining posterior chest pain worse with deep breath.  No shortness of breath.  No dizziness. No abdominal pain  nausea vomiting. No dysuria hematuria. No fever or chills.  Objective: Vitals:   03/03/20 0425 03/03/20 0444 03/03/20 0751 03/03/20 1127  BP: 130/74 119/71 121/74 129/76  Pulse: (!) 109 (!) 102 98 98  Resp:  16 18 18   Temp: 98.6 F (37 C) 98.6 F (37 C) 98.8 F (37.1 C) 98.6 F (37 C)  TempSrc: Oral Oral Oral Oral  SpO2: 98% 97% 95% 100%  Weight:  52 kg    Height:        Intake/Output Summary (Last 24 hours) at 03/03/2020 1500 Last data filed at  03/03/2020 1345 Gross per 24 hour  Intake 720 ml  Output 1000 ml  Net -280 ml   Filed Weights   02/29/20 1111 03/02/20 0403 03/03/20 0444  Weight: 49.9 kg 52.6 kg 52 kg    Examination:  General exam: Appears calm and comfortable  Respiratory system: Clear to auscultation. Respiratory effort normal. Cardiovascular system: S1 & S2 heard, RRR. No JVD, murmurs, rubs, gallops or clicks. No pedal edema. Gastrointestinal system: Abdomen is nondistended, soft and nontender. No organomegaly or masses felt. Normal bowel sounds heard. Central nervous system: Alert and oriented. No focal neurological deficits. Extremities: Symmetric 5 x 5 power. Skin: No rashes, lesions or ulcers Psychiatry: Judgement and insight appear normal. Mood & affect appropriate.     Data Reviewed: I have personally reviewed following labs and imaging studies  CBC: Recent Labs  Lab 02/28/20 1916 02/28/20 1916 03/01/20 0611 03/01/20 1847 03/02/20 0624 03/02/20 1301 03/03/20 0510  WBC 12.1*  --  11.7*  --  9.4  --  7.3  HGB 12.9   < > 8.9* 9.9* 7.8* 8.1* 8.1*  HCT 38.5  --  26.6*  --  23.5*  --  23.7*  MCV 89.3  --  89.9  --  90.0  --  87.8  PLT 342  --  259  --  269  --  316   < > = values in this interval not displayed.   Basic Metabolic Panel: Recent Labs  Lab 02/28/20 1916 02/29/20 0912 03/01/20 0611 03/02/20 0624 03/03/20 0510  NA 132* 132* 135 137 139  K 3.3* 3.1* 3.5 3.8 4.3  CL 92* 97* 103 107 110  CO2 23 22 21* 22 21*  GLUCOSE 96 86 86 100* 86  BUN 14 10 7* 6* 5*  CREATININE 0.92 0.79 0.68 0.74 0.73  CALCIUM 8.9 8.2* 7.7* 8.0* 8.4*  MG  --   --  1.6*  --  2.1   GFR: Estimated Creatinine Clearance: 43 mL/min (by C-G formula based on SCr of 0.73 mg/dL). Liver Function Tests: Recent Labs  Lab 02/28/20 1916 02/29/20 0912  AST 15 13*  ALT 9 8  ALKPHOS 67 63  BILITOT 1.3* 0.9  PROT 7.8 7.0  ALBUMIN 3.3* 2.9*   Recent Labs  Lab 02/28/20 1916  LIPASE 23   No results for  input(s): AMMONIA in the last 168 hours. Coagulation Profile: Recent Labs  Lab 02/29/20 0008  INR 1.1   Cardiac Enzymes: No results for input(s): CKTOTAL, CKMB, CKMBINDEX, TROPONINI in the last 168 hours. BNP (last 3 results) No results for input(s): PROBNP in the last 8760 hours. HbA1C: No results for input(s): HGBA1C in the last 72 hours. CBG: No results for input(s): GLUCAP in the last 168 hours. Lipid Profile: No results for input(s): CHOL, HDL, LDLCALC, TRIG, CHOLHDL, LDLDIRECT in the last 72 hours. Thyroid Function Tests: Recent Labs    03/02/20 0624  TSH  3.185   Anemia Panel: Recent Labs    03/02/20 0624  VITAMINB12 4,403*  FERRITIN 204  TIBC 169*  IRON 13*   Sepsis Labs: Recent Labs  Lab 02/28/20 0008 03/01/20 0611 03/02/20 0624  PROCALCITON  --  0.18 0.13  LATICACIDVEN 1.6  --   --     Recent Results (from the past 240 hour(s))  Urine Culture     Status: Abnormal   Collection Time: 02/28/20  7:16 PM   Specimen: Urine, Random  Result Value Ref Range Status   Specimen Description   Final    URINE, RANDOM Performed at [facility], [address] [place], [place] [address]    Special Requests   Final    NONE Performed at [facility], [place] [address], [place], [place] [address]    Culture MULTIPLE SPECIES PRESENT, SUGGEST RECOLLECTION (A)  Final   Report Status 03/01/2020 FINAL  Final  SARS Coronavirus 2 by RT PCR (hospital order, performed in [facility] hospital lab) Nasopharyngeal Nasopharyngeal Swab     Status: None   Collection Time: 02/29/20 12:08 AM   Specimen: Nasopharyngeal Swab  Result Value Ref Range Status   SARS Coronavirus 2 NEGATIVE NEGATIVE Final    Comment: (NOTE) SARS-CoV-2 target nucleic acids are NOT DETECTED.  The SARS-CoV-2 RNA is generally detectable in upper and lower respiratory specimens during the acute phase of infection. The lowest concentration of SARS-CoV-2 viral copies this assay can detect is  250 copies / mL. A negative result does not preclude SARS-CoV-2 infection and should not be used as the sole basis for treatment or other patient management decisions.  A negative result may occur with improper specimen collection / handling, submission of specimen other than nasopharyngeal swab, presence of viral mutation(s) within the areas targeted by this assay, and inadequate number of viral copies (<250 copies / mL). A negative result must be combined with clinical observations, patient history, and epidemiological information.  Fact Sheet for Patients:   StrictlyIdeas.no  Fact Sheet for Healthcare Providers: BankingDealers.co.za  This test is not yet approved or  cleared by the [place] FDA and has been authorized for detection and/or diagnosis of SARS-CoV-2 by FDA under an Emergency Use Authorization (EUA).  This EUA will remain in effect (meaning this test can be used) for the duration of the COVID-19 declaration under Section 564(b)(1) of the Act, 21 U.S.C. section 360bbb-3(b)(1), unless the authorization is terminated or revoked sooner.  Performed at [facility], [address] [place], [place] [address]   Blood culture (routine x 2)     Status: Abnormal (Preliminary result)   Collection Time: 02/29/20 12:08 AM   Specimen: BLOOD  Result Value Ref Range Status   Specimen Description   Final    BLOOD RIGHT [facility] Performed at [facility], [address] [place], [place] [address]    Special Requests   Final    BOTTLES DRAWN AEROBIC AND ANAEROBIC Blood Culture adequate volume Performed at [facility], [address], [place], [place] [address]    Culture  Setup Time   Final    GRAM POSITIVE COCCI AEROBIC BOTTLE ONLY CRITICAL VALUE NOTED.  VALUE IS CONSISTENT WITH PREVIOUSLY REPORTED AND CALLED VALUE. Performed at [facility], [address] [place], [place] [address]     Culture (A)  Final    Irma Newness Sent to [facility] for further susceptibility testing. Performed at [facility], [place],  [place] [address]    Report Status PENDING  Incomplete  Blood culture (routine x 2)     Status: Abnormal   Collection Time: 02/29/20 12:08 AM   Specimen: BLOOD  Result Value Ref Range Status   Specimen Description   Final    BLOOD LEFT AC Performed at [facility], [address] [place], [place] [address]    Special Requests   Final    BOTTLES DRAWN AEROBIC AND ANAEROBIC Blood Culture adequate volume Performed at [facility], [address] [place], [place] [address]    Culture  Setup Time   Final    GRAM POSITIVE COCCI ANAEROBIC BOTTLE ONLY CRITICAL RESULT CALLED TO, READ BACK BY AND VERIFIED WITH: ASAJAH DUNCAN 02/29/20 AT 1702 BY ACR    Culture (A)  Final    STAPHYLOCOCCUS WARNERI THE SIGNIFICANCE OF ISOLATING THIS ORGANISM FROM A SINGLE SET OF BLOOD CULTURES WHEN MULTIPLE SETS ARE DRAWN IS UNCERTAIN. PLEASE NOTIFY THE MICROBIOLOGY DEPARTMENT WITHIN ONE WEEK IF SPECIATION AND SENSITIVITIES ARE REQUIRED. Performed at [facility], [place] [address], [place], [place] [address]    Report Status 03/02/2020 FINAL  Final  Blood Culture ID Panel (Reflexed)     Status: Abnormal   Collection Time: 02/29/20 12:08 AM  Result Value Ref Range Status   Enterococcus faecalis NOT DETECTED NOT DETECTED Final   Enterococcus Faecium NOT DETECTED NOT DETECTED Final   Listeria monocytogenes NOT DETECTED NOT DETECTED Final   Staphylococcus species DETECTED (A) NOT DETECTED Final    Comment: CRITICAL RESULT CALLED TO, READ BACK BY AND VERIFIED WITH: ASAJAH DUNCAN 02/29/20 AT 1702 BY ACR    Staphylococcus aureus (BCID) NOT DETECTED NOT DETECTED Final   Staphylococcus epidermidis NOT DETECTED NOT DETECTED Final   Staphylococcus lugdunensis NOT DETECTED NOT DETECTED Final   Streptococcus species NOT DETECTED NOT DETECTED  Final   Streptococcus agalactiae NOT DETECTED NOT DETECTED Final   Streptococcus pneumoniae NOT DETECTED NOT DETECTED Final   Streptococcus pyogenes NOT DETECTED NOT DETECTED Final   A.calcoaceticus-baumannii NOT DETECTED NOT DETECTED Final   Bacteroides fragilis NOT DETECTED NOT DETECTED Final   Enterobacterales NOT DETECTED NOT DETECTED Final   Enterobacter cloacae complex NOT DETECTED NOT DETECTED Final   Escherichia coli NOT DETECTED NOT DETECTED Final   Klebsiella aerogenes NOT DETECTED NOT DETECTED Final   Klebsiella oxytoca NOT DETECTED NOT DETECTED Final   Klebsiella pneumoniae NOT DETECTED NOT DETECTED Final   Proteus species NOT DETECTED NOT DETECTED Final   Salmonella species NOT DETECTED NOT DETECTED Final   Serratia marcescens NOT DETECTED NOT DETECTED Final   Haemophilus influenzae NOT DETECTED NOT DETECTED Final   Neisseria meningitidis NOT DETECTED NOT DETECTED Final   Pseudomonas aeruginosa NOT DETECTED NOT DETECTED Final   Stenotrophomonas maltophilia NOT DETECTED NOT DETECTED Final   Candida albicans NOT DETECTED NOT DETECTED Final   Candida auris NOT DETECTED NOT DETECTED Final   Candida glabrata NOT DETECTED NOT DETECTED Final   Candida krusei NOT DETECTED NOT DETECTED Final   Candida parapsilosis NOT DETECTED NOT DETECTED Final   Candida tropicalis NOT DETECTED NOT DETECTED Final   Cryptococcus neoformans/gattii NOT DETECTED NOT DETECTED Final    Comment: Performed at [facility], [address] [place], [place] [address]  CULTURE, BLOOD (ROUTINE X 2) w Reflex to ID Panel     Status: None (Preliminary result)   Collection Time: 03/02/20  6:24 AM   Specimen: BLOOD  Result Value Ref Range Status  Specimen Description BLOOD RIGTH UPPER ARM  Final   Special Requests   Final    BOTTLES DRAWN AEROBIC AND ANAEROBIC Blood Culture adequate volume   Culture   Final    NO GROWTH 1 DAY Performed at [facility], [address] [place], [place]  [address]    Report Status PENDING  Incomplete  CULTURE, BLOOD (ROUTINE X 2) w Reflex to ID Panel     Status: None (Preliminary result)   Collection Time: 03/02/20  6:34 AM   Specimen: BLOOD  Result Value Ref Range Status   Specimen Description BLOOD RIGHT HAND  Final   Special Requests   Final    BOTTLES DRAWN AEROBIC AND ANAEROBIC Blood Culture adequate volume   Culture   Final    NO GROWTH 1 DAY Performed at [facility], [address] [place], [place] [address]    Report Status PENDING  Incomplete         Radiology Studies: No results found.      Scheduled Meds: . apixaban  10 mg Oral BID   Followed by  . [START ON 03/09/2020] apixaban  5 mg Oral BID  . buPROPion  150 mg Oral Daily  . feeding supplement (ENSURE ENLIVE)  237 mL Oral BID BM  . influenza vaccine adjuvanted  0.5 mL Intramuscular Tomorrow-1000  . multivitamin with minerals  1 tablet Oral Daily  . polyethylene glycol  17 g Oral Daily  . sodium chloride flush  10-40 mL Intracatheter Q12H   Continuous Infusions: . sodium chloride 500 mL (03/03/20 0008)  . vancomycin 1,000 mg (03/03/20 0009)     LOS: 3 days    Time spent: 27 minutes    Sharen Hones, MD [facility]   To contact the attending provider between 7A-7P or the covering provider during after hours 7P-7A, please log into the web site [email or web address] and access using universal [place] password for that web site. If you do not have the password, please call the hospital operator.  03/03/2020, 3:00 PM

## 2020-03-03 NOTE — Progress Notes (Signed)
ID Doing well No chest pain No sob Walking in her room Patient Vitals for the past 24 hrs:  BP Temp Temp src Pulse Resp SpO2 Weight  03/03/20 1521 116/82 98.3 F (36.8 C) Oral (!) 104 20 100 % --  03/03/20 1127 129/76 98.6 F (37 C) Oral 98 18 100 % --  03/03/20 0751 121/74 98.8 F (37.1 C) Oral 98 18 95 % --  03/03/20 0444 119/71 98.6 F (37 C) Oral (!) 102 16 97 % 52 kg  03/03/20 0425 130/74 98.6 F (37 C) Oral (!) 109 -- 98 % --  03/02/20 1943 128/71 98 F (36.7 C) Oral 95 16 100 % --    Chest b/l air entry [id number] Abd soft   CBC Latest Ref Rng & Units 03/03/2020 03/02/2020 03/02/2020  WBC 4.0 - 10.5 K/uL 7.3 - 9.4  Hemoglobin 12.0 - 15.0 g/dL 8.1(L) 8.1(L) 7.8(L)  Hematocrit 36 - 46 % 23.7(L) - 23.5(L)  Platelets 150 - 400 K/uL 316 - 269    CMP Latest Ref Rng & Units 03/03/2020 03/02/2020 03/01/2020  Glucose 70 - 99 mg/dL 86 100(H) 86  BUN 8 - 23 mg/dL 5(L) 6(L) 7(L)  Creatinine 0.44 - 1.00 mg/dL 0.73 0.74 0.68  Sodium 135 - 145 mmol/L 139 137 135  Potassium 3.5 - 5.1 mmol/L 4.3 3.8 3.5  Chloride 98 - 111 mmol/L 110 107 103  CO2 22 - 32 mmol/L 21(L) 22 21(L)  Calcium 8.9 - 10.3 mg/dL 8.4(L) 8.0(L) 7.7(L)  Total Protein 6.5 - 8.1 g/dL - - -  Total Bilirubin 0.3 - 1.2 mg/dL - - -  Alkaline Phos 38 - 126 U/L - - -  AST 15 - 41 U/L - - -  ALT 0 - 44 U/L - - -   Impression/recommendation Admitted with back apin and abdominal pain Diagnosed with left DVT and extensive PE S/p thrombectomy of the pulmonary arteries and left femoral vein- on anticoagulation Unclear etiology for DVT- ? Provoked- was on HRT Recent lap hysterectomy  2 months ago but was not immobile   Rothia muclioginosa and staph warneri- 1/4 - on Clincal examination, history  there is no evidence of infection, Repeat cultures neg so far- there are skin contaminantst-  Will DC vanco ID will sign off- call if needed

## 2020-03-03 NOTE — Care Management Important Message (Signed)
Important Message  Patient Details  Name: Patricia Miller MRN: [id number] Date of Birth: 1946-01-22   Medicare Important Message Given:  Yes     Dannette Barbara 03/03/2020, 2:02 PM

## 2020-03-04 LAB — CBC WITH DIFFERENTIAL/PLATELET
Abs Immature Granulocytes: 0.09 10*3/uL — ABNORMAL HIGH (ref 0.00–0.07)
Basophils Absolute: 0 10*3/uL (ref 0.0–0.1)
Basophils Relative: 0 %
Eosinophils Absolute: 0.2 10*3/uL (ref 0.0–0.5)
Eosinophils Relative: 3 %
HCT: 24.7 % — ABNORMAL LOW (ref 36.0–46.0)
Hemoglobin: 8.4 g/dL — ABNORMAL LOW (ref 12.0–15.0)
Immature Granulocytes: 1 %
Lymphocytes Relative: 20 %
Lymphs Abs: 1.5 10*3/uL (ref 0.7–4.0)
MCH: 30.3 pg (ref 26.0–34.0)
MCHC: 34 g/dL (ref 30.0–36.0)
MCV: 89.2 fL (ref 80.0–100.0)
Monocytes Absolute: 0.6 10*3/uL (ref 0.1–1.0)
Monocytes Relative: 8 %
Neutro Abs: 4.9 10*3/uL (ref 1.7–7.7)
Neutrophils Relative %: 68 %
Platelets: 325 10*3/uL (ref 150–400)
RBC: 2.77 MIL/uL — ABNORMAL LOW (ref 3.87–5.11)
RDW: 12.8 % (ref 11.5–15.5)
WBC: 7.2 10*3/uL (ref 4.0–10.5)
nRBC: 0.3 % — ABNORMAL HIGH (ref 0.0–0.2)

## 2020-03-04 NOTE — Progress Notes (Signed)
PROGRESS NOTE    Patricia Miller  HMC:[id number] DOB: Jul 14, 1945 DOA: 02/28/2020 PCP: Rusty Aus, MD   Chief complaint.  Chest pain.  Brief Narrative:  Patricia Miller Safewrightis a 74 y.o.femalewith medical history significant forHTN, basal cell carcinoma, history of tubular adenoma, status post laparoscopic hysterectomy 12/28/2019 on HRT's, who presents to the emergency room with a several day history of dull achy right upper and lower abdominal pain, nonradiating, of moderate to severe intensity now associated with shortness of breath. CT scan showed marked severity PE involving right pulmonary artery and several is middle lobe and the lower lobe branches. Also showed mild to moderate multifocal infiltrates with the right lower lobe consistent with pneumonia. Duplex ultrasound also showed DVT in the left common femoral vein.  9/20.Patient was seen by vascular surgery, received thrombolysis in her right pulmonary artery, mechanical thrombectomy in the right main, middle and lower lobe pulmonary arteries, IVC filter, and mechanical thrombectomy to the left common femoral and proximal superficial femoral veins. Patient is placed on heparin drip.  9/21.Patient was placed on antibiotics with Zithromax and vancomycin for positive blood culture with Rothia Mucilaginosa.   9/22.Hemoglobin stable. Anticoagulation changed to Eliquis. Consulted ID for bacteremia.  9/24.  Antibiotics discontinued, positive blood culture probably contaminant.  Family cannot take patient home.   Assessment & Plan:   Active Problems:   Acute pulmonary embolism (HCC)   Acute deep vein thrombosis (DVT) of femoral vein of left lower extremity (HCC)   RLL pneumonia   Pulmonary nodule 1 cm or greater in diameter   Hormone replacement therapy (HRT)   History of squamous cell carcinoma   S/P laparoscopic hysterectomy 12/2019    Hx of Tubular adenoma   HTN (hypertension)   Hypokalemia   Memory loss    Acute blood loss anemia  #1.  Acute pulmonary emboli and the left lower extremity DVT. Condition has stabilized.  Status post vascular procedure and IVC filter. Continue Eliquis.  2.  Pure acute blood loss anemia with iron deficient anemia. Received IV iron.  Hemoglobin has been stable.  3.  Positive blood culture. Appear to be contaminant.  4.  Right-sided pneumonia. Appear to be secondary to pulmonary emboli.  Not due to bacterial pneumonia.  5.  Pulmonary nodule on CT scan.  Patient be followed by PCP as outpatient.     DVT prophylaxis: Eliquis Code Status: Full Family Communication: Discussed with patient niece.  .   Status is: Inpatient  Remains inpatient appropriate because:Unsafe d/c plan  Had a discussion with patient niece, there is nobody taking care of patient tonight.  She has tried to arrange for patient 24/7 care at home.  She is not ready for the patient to come tonight.  But she states that she can go tomorrow.   Dispo: The patient is from: Home              Anticipated d/c is to: Home              Anticipated d/c date is: 1 day              Patient currently is medically stable to d/c.        I/O last 3 completed shifts: In: 8 [P.O.:720] Out: 1001 [Urine:1001] Total I/O In: -  Out: 2 [Urine:2]     Consultants:   Vascular surgery  Procedures:   Antimicrobials: None  Subjective: Patient has some confusion today, otherwise doing well.  Denies any short of breath  or cough.  No abdominal pain or nausea vomiting. No fever chills.  Objective: Vitals:   03/03/20 2012 03/04/20 0508 03/04/20 0801 03/04/20 1118  BP: (!) 150/92 136/86 125/75 134/80  Pulse: (!) 104 (!) 105 95 97  Resp:   18 19  Temp: 97.9 F (36.6 C) 98.4 F (36.9 C) 98.6 F (37 C) 97.9 F (36.6 C)  TempSrc: Oral Oral Oral Oral  SpO2: 100% 96% 96% 100%  Weight:  52.2 kg    Height:        Intake/Output Summary (Last 24 hours) at 03/04/2020 1504 Last data filed at  03/04/2020 1210 Gross per 24 hour  Intake 0 ml  Output 3 ml  Net -3 ml   Filed Weights   03/02/20 0403 03/03/20 0444 03/04/20 0508  Weight: 52.6 kg 52 kg 52.2 kg    Examination:  General exam: Appears calm and comfortable  Respiratory system: Clear to auscultation. Respiratory effort normal. Cardiovascular system: S1 & S2 heard, RRR. No JVD, murmurs, rubs, gallops or clicks. No pedal edema. Gastrointestinal system: Abdomen is nondistended, soft and nontender. No organomegaly or masses felt. Normal bowel sounds heard. Central nervous system: Alert and oriented x2. No focal neurological deficits. Extremities: Symmetric  Skin: No rashes, lesions or ulcers Psychiatry:  Mood & affect appropriate.     Data Reviewed: I have personally reviewed following labs and imaging studies  CBC: Recent Labs  Lab 02/28/20 1916 02/28/20 1916 03/01/20 0611 03/01/20 0611 03/01/20 1847 03/02/20 0624 03/02/20 1301 03/03/20 0510 03/04/20 0608  WBC 12.1*  --  11.7*  --   --  9.4  --  7.3 7.2  NEUTROABS  --   --   --   --   --   --   --   --  4.9  HGB 12.9   < > 8.9*   < > 9.9* 7.8* 8.1* 8.1* 8.4*  HCT 38.5  --  26.6*  --   --  23.5*  --  23.7* 24.7*  MCV 89.3  --  89.9  --   --  90.0  --  87.8 89.2  PLT 342  --  259  --   --  269  --  316 325   < > = values in this interval not displayed.   Basic Metabolic Panel: Recent Labs  Lab 02/28/20 1916 02/29/20 0912 03/01/20 0611 03/02/20 0624 03/03/20 0510  NA 132* 132* 135 137 139  K 3.3* 3.1* 3.5 3.8 4.3  CL 92* 97* 103 107 110  CO2 23 22 21* 22 21*  GLUCOSE 96 86 86 100* 86  BUN 14 10 7* 6* 5*  CREATININE 0.92 0.79 0.68 0.74 0.73  CALCIUM 8.9 8.2* 7.7* 8.0* 8.4*  MG  --   --  1.6*  --  2.1   GFR: Estimated Creatinine Clearance: 43 mL/min (by C-G formula based on SCr of 0.73 mg/dL). Liver Function Tests: Recent Labs  Lab 02/28/20 1916 02/29/20 0912  AST 15 13*  ALT 9 8  ALKPHOS 67 63  BILITOT 1.3* 0.9  PROT 7.8 7.0  ALBUMIN  3.3* 2.9*   Recent Labs  Lab 02/28/20 1916  LIPASE 23   No results for input(s): AMMONIA in the last 168 hours. Coagulation Profile: Recent Labs  Lab 02/29/20 0008  INR 1.1   Cardiac Enzymes: No results for input(s): CKTOTAL, CKMB, CKMBINDEX, TROPONINI in the last 168 hours. BNP (last 3 results) No results for input(s): PROBNP in the last 8760 hours. HbA1C:  No results for input(s): HGBA1C in the last 72 hours. CBG: No results for input(s): GLUCAP in the last 168 hours. Lipid Profile: No results for input(s): CHOL, HDL, LDLCALC, TRIG, CHOLHDL, LDLDIRECT in the last 72 hours. Thyroid Function Tests: Recent Labs    03/02/20 0624  TSH 3.185   Anemia Panel: Recent Labs    03/02/20 0624  VITAMINB12 4,403*  FERRITIN 204  TIBC 169*  IRON 13*   Sepsis Labs: Recent Labs  Lab 02/28/20 0008 03/01/20 0611 03/02/20 0624  PROCALCITON  --  0.18 0.13  LATICACIDVEN 1.6  --   --     Recent Results (from the past 240 hour(s))  Urine Culture     Status: Abnormal   Collection Time: 02/28/20  7:16 PM   Specimen: Urine, Random  Result Value Ref Range Status   Specimen Description   Final    URINE, RANDOM Performed at [facility], [address], [place], [place] [address]    Special Requests   Final    NONE Performed at [facility], [place] [address] [place], [place] [address]    Culture MULTIPLE SPECIES PRESENT, SUGGEST RECOLLECTION (A)  Final   Report Status 03/01/2020 FINAL  Final  SARS Coronavirus 2 by RT PCR (hospital order, performed in [facility] hospital lab) Nasopharyngeal Nasopharyngeal Swab     Status: None   Collection Time: 02/29/20 12:08 AM   Specimen: Nasopharyngeal Swab  Result Value Ref Range Status   SARS Coronavirus 2 NEGATIVE NEGATIVE Final    Comment: (NOTE) SARS-CoV-2 target nucleic acids are NOT DETECTED.  The SARS-CoV-2 RNA is generally detectable in upper and lower respiratory specimens during the acute phase of infection. The  lowest concentration of SARS-CoV-2 viral copies this assay can detect is 250 copies / mL. A negative result does not preclude SARS-CoV-2 infection and should not be used as the sole basis for treatment or other patient management decisions.  A negative result may occur with improper specimen collection / handling, submission of specimen other than nasopharyngeal swab, presence of viral mutation(s) within the areas targeted by this assay, and inadequate number of viral copies (<250 copies / mL). A negative result must be combined with clinical observations, patient history, and epidemiological information.  Fact Sheet for Patients:   StrictlyIdeas.no  Fact Sheet for Healthcare Providers: BankingDealers.co.za  This test is not yet approved or  cleared by the [place] FDA and has been authorized for detection and/or diagnosis of SARS-CoV-2 by FDA under an Emergency Use Authorization (EUA).  This EUA will remain in effect (meaning this test can be used) for the duration of the COVID-19 declaration under Section 564(b)(1) of the Act, 21 U.S.C. section 360bbb-3(b)(1), unless the authorization is terminated or revoked sooner.  Performed at [facility], [address] [place], [place] [address]   Blood culture (routine x 2)     Status: Abnormal (Preliminary result)   Collection Time: 02/29/20 12:08 AM   Specimen: BLOOD  Result Value Ref Range Status   Specimen Description   Final    BLOOD RIGHT [facility] Performed at [facility], [address] [place], [place] [address]    Special Requests   Final    BOTTLES DRAWN AEROBIC AND ANAEROBIC Blood Culture adequate volume Performed at [facility], [address], [place], [place] [address]    Culture  Setup Time   Final    GRAM POSITIVE COCCI AEROBIC BOTTLE ONLY CRITICAL VALUE NOTED.  VALUE IS  CONSISTENT WITH PREVIOUSLY REPORTED AND CALLED VALUE. Performed at  [facility], [address] [place], [place] [address]    Culture (A)  Final    Irma Newness Sent to [place] for further susceptibility testing. Performed at [facility], [place] [address], [place], [place] [address]    Report Status PENDING  Incomplete  Blood culture (routine x 2)     Status: Abnormal   Collection Time: 02/29/20 12:08 AM   Specimen: BLOOD  Result Value Ref Range Status   Specimen Description   Final    BLOOD LEFT AC Performed at [facility], [address] [place], [place] [address]    Special Requests   Final    BOTTLES DRAWN AEROBIC AND ANAEROBIC Blood Culture adequate volume Performed at [facility], [address] [place], [place] [address]    Culture  Setup Time   Final    GRAM POSITIVE COCCI ANAEROBIC BOTTLE ONLY CRITICAL RESULT CALLED TO, READ BACK BY AND VERIFIED WITH: ASAJAH DUNCAN 02/29/20 AT 1702 BY ACR    Culture (A)  Final    STAPHYLOCOCCUS WARNERI THE SIGNIFICANCE OF ISOLATING THIS ORGANISM FROM A SINGLE SET OF BLOOD CULTURES WHEN MULTIPLE SETS ARE DRAWN IS UNCERTAIN. PLEASE NOTIFY THE MICROBIOLOGY DEPARTMENT WITHIN ONE WEEK IF SPECIATION AND SENSITIVITIES ARE REQUIRED. Performed at [facility], [place] [address], [place], [place] [address]    Report Status 03/02/2020 FINAL  Final  Blood Culture ID Panel (Reflexed)     Status: Abnormal   Collection Time: 02/29/20 12:08 AM  Result Value Ref Range Status   Enterococcus faecalis NOT DETECTED NOT DETECTED Final   Enterococcus Faecium NOT DETECTED NOT DETECTED Final   Listeria monocytogenes NOT DETECTED NOT DETECTED Final   Staphylococcus species DETECTED (A) NOT DETECTED Final    Comment: CRITICAL RESULT CALLED TO, READ BACK BY AND VERIFIED WITH: ASAJAH DUNCAN 02/29/20 AT 1702 BY ACR    Staphylococcus aureus (BCID) NOT DETECTED NOT DETECTED Final   Staphylococcus epidermidis NOT DETECTED NOT DETECTED Final   Staphylococcus lugdunensis NOT DETECTED  NOT DETECTED Final   Streptococcus species NOT DETECTED NOT DETECTED Final   Streptococcus agalactiae NOT DETECTED NOT DETECTED Final   Streptococcus pneumoniae NOT DETECTED NOT DETECTED Final   Streptococcus pyogenes NOT DETECTED NOT DETECTED Final   A.calcoaceticus-baumannii NOT DETECTED NOT DETECTED Final   Bacteroides fragilis NOT DETECTED NOT DETECTED Final   Enterobacterales NOT DETECTED NOT DETECTED Final   Enterobacter cloacae complex NOT DETECTED NOT DETECTED Final   Escherichia coli NOT DETECTED NOT DETECTED Final   Klebsiella aerogenes NOT DETECTED NOT DETECTED Final   Klebsiella oxytoca NOT DETECTED NOT DETECTED Final   Klebsiella pneumoniae NOT DETECTED NOT DETECTED Final   Proteus species NOT DETECTED NOT DETECTED Final   Salmonella species NOT DETECTED NOT DETECTED Final   Serratia marcescens NOT DETECTED NOT DETECTED Final   Haemophilus influenzae NOT DETECTED NOT DETECTED Final   Neisseria meningitidis NOT DETECTED NOT DETECTED Final   Pseudomonas aeruginosa NOT DETECTED NOT DETECTED Final   Stenotrophomonas maltophilia NOT DETECTED NOT DETECTED Final   Candida albicans NOT DETECTED NOT DETECTED Final   Candida auris NOT DETECTED NOT DETECTED Final   Candida glabrata NOT DETECTED NOT DETECTED Final   Candida krusei NOT DETECTED NOT DETECTED Final   Candida parapsilosis NOT DETECTED NOT DETECTED Final   Candida tropicalis NOT DETECTED NOT DETECTED Final   Cryptococcus neoformans/gattii NOT DETECTED NOT DETECTED Final    Comment: Performed  at [facility], [address] [place], [place] [address]  Susceptibility, Aer + Anaerob     Status: Abnormal   Collection Time: 02/29/20 12:08 AM  Result Value Ref Range Status   Suscept, Aer + Anaerob Preliminary report (A)  Final    Comment: (NOTE) Performed At: [facility] [place], [place] [id number] Rush Farmer MD NW:[id number]    Source of Sample [facility] BLOOD  Final     Comment: Performed at [facility], [place] [address], [place], [place] [address]  Susceptibility Result     Status: Abnormal   Collection Time: 02/29/20 12:08 AM  Result Value Ref Range Status   Suscept Result 1 Rothia mucilaginosa (A)  Final    Comment: (NOTE) Performed At: [facility] [place], [place] [id number] Rush Farmer MD LK:[id number]   CULTURE, BLOOD (ROUTINE X 2) w Reflex to ID Panel     Status: None (Preliminary result)   Collection Time: 03/02/20  6:24 AM   Specimen: BLOOD  Result Value Ref Range Status   Specimen Description BLOOD RIGTH UPPER ARM  Final   Special Requests   Final    BOTTLES DRAWN AEROBIC AND ANAEROBIC Blood Culture adequate volume   Culture   Final    NO GROWTH 2 DAYS Performed at [facility], [address], [place], [place] [address]    Report Status PENDING  Incomplete  CULTURE, BLOOD (ROUTINE X 2) w Reflex to ID Panel     Status: None (Preliminary result)   Collection Time: 03/02/20  6:34 AM   Specimen: BLOOD  Result Value Ref Range Status   Specimen Description BLOOD RIGHT HAND  Final   Special Requests   Final    BOTTLES DRAWN AEROBIC AND ANAEROBIC Blood Culture adequate volume   Culture   Final    NO GROWTH 2 DAYS Performed at [facility], [address] [place], [place] [address]    Report Status PENDING  Incomplete         Radiology Studies: No results found.      Scheduled Meds: . apixaban  10 mg Oral BID   Followed by  . [START ON 03/09/2020] apixaban  5 mg Oral BID  . buPROPion  150 mg Oral Daily  . feeding supplement (ENSURE ENLIVE)  237 mL Oral BID BM  . influenza vaccine adjuvanted  0.5 mL Intramuscular Tomorrow-1000  . multivitamin with minerals  1 tablet Oral Daily  . polyethylene glycol  17 g Oral Daily  . sodium chloride flush  10-40 mL Intracatheter Q12H   Continuous Infusions: . sodium chloride 500 mL (03/03/20 0008)     LOS: 4 days    Time spent: 26  minutes    Sharen Hones, MD [facility]   To contact the attending provider between 7A-7P or the covering provider during after hours 7P-7A, please log into the web site [email or web address] and access using universal [place] password for that web site. If you do not have the password, please call the hospital operator.  03/04/2020, 3:04 PM

## 2020-03-04 NOTE — Progress Notes (Signed)
Mobility Specialist - Progress Note   03/04/20 1623  Mobility  Activity Refused mobility  Mobility performed by Mobility specialist    Pt declined mobility stating that she just spoke with her doctor who says she'll be D/C soon. Followed up with nurse to confirm. Will attempt session another date if pt is available.    Kathee Delton Mobility Specialist 03/04/20, 4:25 PM

## 2020-03-05 LAB — CBC WITH DIFFERENTIAL/PLATELET
Abs Immature Granulocytes: 0.11 10*3/uL — ABNORMAL HIGH (ref 0.00–0.07)
Basophils Absolute: 0 10*3/uL (ref 0.0–0.1)
Basophils Relative: 0 %
Eosinophils Absolute: 0.2 10*3/uL (ref 0.0–0.5)
Eosinophils Relative: 3 %
HCT: 25.9 % — ABNORMAL LOW (ref 36.0–46.0)
Hemoglobin: 8.5 g/dL — ABNORMAL LOW (ref 12.0–15.0)
Immature Granulocytes: 2 %
Lymphocytes Relative: 20 %
Lymphs Abs: 1.4 10*3/uL (ref 0.7–4.0)
MCH: 30.1 pg (ref 26.0–34.0)
MCHC: 32.8 g/dL (ref 30.0–36.0)
MCV: 91.8 fL (ref 80.0–100.0)
Monocytes Absolute: 0.5 10*3/uL (ref 0.1–1.0)
Monocytes Relative: 8 %
Neutro Abs: 4.7 10*3/uL (ref 1.7–7.7)
Neutrophils Relative %: 67 %
Platelets: 361 10*3/uL (ref 150–400)
RBC: 2.82 MIL/uL — ABNORMAL LOW (ref 3.87–5.11)
RDW: 12.7 % (ref 11.5–15.5)
WBC: 7 10*3/uL (ref 4.0–10.5)
nRBC: 0.3 % — ABNORMAL HIGH (ref 0.0–0.2)

## 2020-03-05 MED ORDER — APIXABAN 5 MG PO TABS: 10.0000 mg | ORAL_TABLET | Freq: Two times a day (BID) | ORAL | 0 refills | Status: DC

## 2020-03-05 MED ORDER — APIXABAN 5 MG PO TABS
5.0000 mg | ORAL_TABLET | Freq: Two times a day (BID) | ORAL | 0 refills | Status: DC
Start: 2020-03-09 — End: 2020-06-13

## 2020-03-05 NOTE — Discharge Summary (Signed)
Physician Discharge Summary  Patient ID: Patricia Miller MRN: [id number] DOB/AGE: 1945-10-17 74 y.o.  Admit date: 02/28/2020 Discharge date: 03/05/2020  Admission Diagnoses:  Discharge Diagnoses:  Active Problems:   Acute pulmonary embolism (HCC)   Acute deep vein thrombosis (DVT) of femoral vein of left lower extremity (HCC)   RLL pneumonia   Pulmonary nodule 1 cm or greater in diameter   Hormone replacement therapy (HRT)   History of squamous cell carcinoma   S/P laparoscopic hysterectomy 12/2019    Hx of Tubular adenoma   HTN (hypertension)   Hypokalemia   Memory loss   Acute blood loss anemia   Discharged Condition: good  Hospital Course:  Patricia Dimaio Safewrightis a 74 y.o.femalewith medical history significant forHTN, basal cell carcinoma, history of tubular adenoma, status post laparoscopic hysterectomy 12/28/2019 on HRT's, who presents to the emergency room with a several day history of dull achy right upper and lower abdominal pain, nonradiating, of moderate to severe intensity now associated with shortness of breath. CT scan showed marked severity PE involving right pulmonary artery and several is middle lobe and the lower lobe branches. Also showed mild to moderate multifocal infiltrates with the right lower lobe consistent with pneumonia. Duplex ultrasound also showed DVT in the left common femoral vein.  9/20.Patient was seen by vascular surgery, received thrombolysis in her right pulmonary artery, mechanical thrombectomy in the right main, middle and lower lobe pulmonary arteries, IVC filter, and mechanical thrombectomy to the left common femoral and proximal superficial femoral veins. Patient is placed on heparin drip.  9/21.Patient was placed on antibiotics with Zithromax and vancomycin for positive blood culture with Rothia Mucilaginosa.   9/22.Hemoglobin stable. Anticoagulation changed to Eliquis. Consulted ID for bacteremia.  9/24.  Antibiotics  discontinued, positive blood culture probably contaminant.  Family cannot take patient home.  #1.  Acute pulmonary emboli and the left lower extremity DVT. Condition has stabilized.  Status post vascular procedure and IVC filter. Continue Eliquis.  2.  Acute blood loss anemia with iron deficient anemia. Received IV iron.  Hemoglobin has been stable.  3.  Positive blood culture. Appear to be contaminant.  4.  Right-sided pneumonia. Appear to be secondary to pulmonary emboli.  Not due to bacterial pneumonia.  5.  Pulmonary nodule on CT scan.  Patient be followed by PCP as outpatient.    Consults: Vascular surgeon, ID  Significant Diagnostic Studies:  CT ABDOMEN AND PELVIS WITH CONTRAST  TECHNIQUE: Multidetector CT imaging of the abdomen and pelvis was performed using the standard protocol following bolus administration of intravenous contrast.  CONTRAST:  55mL OMNIPAQUE IOHEXOL 300 MG/ML  SOLN  COMPARISON:  None.  FINDINGS: Lower chest: Marked severity intraluminal low attenuation is seen within the right pulmonary artery and several of its middle lobe and lower lobe branches.  Mild to moderate severity multifocal infiltrates are seen within the right lower lobe.  A 1.1 cm x 1.2 cm noncalcified lung nodule versus focal atelectasis is seen within the lateral aspect of the mid right lung.  A very small right pleural effusion is seen.  Hepatobiliary: No focal liver abnormality is seen. No gallstones, gallbladder wall thickening, or biliary dilatation.  Pancreas: Unremarkable. No pancreatic ductal dilatation or surrounding inflammatory changes.  Spleen: Normal in size without focal abnormality.  Adrenals/Urinary Tract: Adrenal glands are unremarkable. Kidneys are normal, without renal calculi, focal lesion, or hydronephrosis. Bladder is unremarkable.  Stomach/Bowel: Stomach is within normal limits. Appendix appears normal. No evidence of bowel  dilatation. Mildly  thickened small bowel loops are seen within the posterior aspect of the lower abdomen. Noninflamed diverticula are seen throughout the sigmoid colon.  Vascular/Lymphatic: There is mild calcification of the abdominal aorta and bilateral common iliac arteries, without evidence of aneurysmal dilatation. A moderate amount of intraluminal low attenuation is seen within the left common femoral vein (axial CT images 72 through 85, CT series number 2).  No enlarged abdominal or pelvic lymph nodes.  Reproductive: Status post hysterectomy. No adnexal masses.  Other: No abdominal wall hernia or abnormality. No abdominopelvic ascites.  Musculoskeletal: There is approximately 2 mm anterolisthesis of the L4 vertebral body on L5, with mild multilevel degenerative changes noted throughout the remainder of the lumbar spine.  IMPRESSION: 1. Marked severity pulmonary embolism involving the right pulmonary artery and several of its middle lobe and lower lobe branches. 2. Mild to moderate severity multifocal infiltrates within the right lower lobe, consistent with pneumonia. 3. 1.1 cm x 1.2 cm noncalcified lung nodule versus focal atelectasis within the lateral aspect of the mid right lung. Correlation with follow-up chest CT is recommended to determine stability, as an underlying neoplastic process cannot be excluded. 4. Moderate amount of DVT within the left common femoral vein. Further evaluation with left lower extremity venous Doppler ultrasound is recommended. 5. Mildly thickened small bowel loops within the lower abdomen which may represent mild enteritis. 6. Sigmoid diverticulosis.  Aortic Atherosclerosis (ICD10-I70.0).  Electronically Signed: By: Virgina Norfolk M.D. On: 02/28/2020 22:52  BILATERAL LOWER EXTREMITY VENOUS DOPPLER ULTRASOUND  TECHNIQUE: Gray-scale sonography with graded compression, as well as color Doppler and duplex ultrasound were  performed to evaluate the lower extremity deep venous systems from the level of the common femoral vein and including the common femoral, femoral, profunda femoral, popliteal and calf veins including the posterior tibial, peroneal and gastrocnemius veins when visible. The superficial great saphenous vein was also interrogated. Spectral Doppler was utilized to evaluate flow at rest and with distal augmentation maneuvers in the common femoral, femoral and popliteal veins.  COMPARISON:  None.  FINDINGS: RIGHT LOWER EXTREMITY  Common Femoral Vein: No evidence of thrombus. Normal compressibility, respiratory phasicity and response to augmentation.  Saphenofemoral Junction: No evidence of thrombus. Normal compressibility and flow on color Doppler imaging.  Profunda Femoral Vein: No evidence of thrombus. Normal compressibility and flow on color Doppler imaging.  Femoral Vein: No evidence of thrombus. Normal compressibility, respiratory phasicity and response to augmentation.  Popliteal Vein: No evidence of thrombus. Normal compressibility, respiratory phasicity and response to augmentation.  Calf Veins: No evidence of thrombus. Normal compressibility and flow on color Doppler imaging.  Superficial Great Saphenous Vein: No evidence of thrombus. Normal compressibility.  Venous Reflux:  None.  Other Findings:  None.  LEFT LOWER EXTREMITY  Common Femoral Vein: Thrombus is noted with decreased compressibility.  Saphenofemoral Junction: Thrombus is noted with decreased compressibility.  Profunda Femoral Vein: No evidence of thrombus. Normal compressibility and flow on color Doppler imaging.  Femoral Vein: Thrombus is noted with decreased compressibility.  Popliteal Vein: Thrombus is noted with decreased compressibility.  Calf Veins: Thrombus is noted in the peroneal vein. The posterior tibial vein is within normal limits.  Superficial Great Saphenous  Vein: No evidence of thrombus. Normal compressibility.  Venous Reflux:  None.  Other Findings:  None.  IMPRESSION: No evidence of deep venous thrombosis in the right lower extremity.  Extensive deep venous thrombosis in the left lower extremity as described.   Electronically Signed   By: Inez Catalina  M.D.   On: 02/29/2020 00:56   Treatments:  thrombolysis in her right pulmonary artery, mechanical thrombectomy in the right main, middle and lower lobe pulmonary arteries, IVC filter, and mechanical thrombectomy to the left common femoral and proximal superficial femoral veins.  Heparin drip,  then eliquis  Discharge Exam: Blood pressure 125/74, pulse 94, temperature 98.7 F (37.1 C), temperature source Oral, resp. rate 18, height 5' (1.524 m), weight 50.5 kg, SpO2 97 %. General appearance: alert, cooperative and Oriented to place and person. Resp: clear to auscultation bilaterally Cardio: regular rate and rhythm, S1, S2 normal, no murmur, click, rub or gallop GI: soft, non-tender; bowel sounds normal; no masses,  no organomegaly Extremities: extremities normal, atraumatic, no cyanosis or edema  Disposition: Discharge disposition: 01-Home or Self Care       Discharge Instructions    Diet - low sodium heart healthy   Complete by: As directed    Increase activity slowly   Complete by: As directed      Allergies as of 03/05/2020   No Known Allergies     Medication List    STOP taking these medications   hydrochlorothiazide 25 MG tablet Commonly known as: HYDRODIURIL     TAKE these medications   apixaban 5 MG Tabs tablet Commonly known as: ELIQUIS Take 2 tablets (10 mg total) by mouth 2 (two) times daily for 4 days.   apixaban 5 MG Tabs tablet Commonly known as: ELIQUIS Take 1 tablet (5 mg total) by mouth 2 (two) times daily. Start taking on: March 09, 2020   buPROPion 150 MG 24 hr tablet Commonly known as: WELLBUTRIN XL Take 150 mg by mouth every  morning.   cetirizine 10 MG tablet Commonly known as: ZYRTEC Take 10 mg by mouth daily as needed for allergies.   Dialyvite Vitamin D 5000 125 MCG (5000 UT) capsule Generic drug: Cholecalciferol Take 5,000 Units by mouth daily.   docusate sodium 100 MG capsule Commonly known as: COLACE Take 1 capsule (100 mg total) by mouth 2 (two) times daily. To keep stools soft   Lutein 40 MG Caps Take 40 mg by mouth daily.   OS-CAL 500 + D PO Take 1 tablet by mouth daily.   oxyCODONE 5 MG immediate release tablet Commonly known as: Oxy IR/ROXICODONE Take 1 tablet (5 mg total) by mouth every 4 (four) hours as needed for severe pain.   Prempro 0.625-5 MG tablet Generic drug: estrogen (conjugated)-medroxyprogesterone Take 1 tablet by mouth every evening.   vitamin B-12 1000 MCG tablet Commonly known as: CYANOCOBALAMIN Take 1,000 mcg by mouth daily.       Follow-up Information    Dew, Erskine Squibb, MD Follow up in 2 week(s).   Specialties: Vascular Surgery, Radiology, Interventional Cardiology Why: Seen as consult. Will need left lower extremity DVT with visit.  Contact information: [place] [address] [phone]              38 minutes Signed: Sharen Hones 03/05/2020, 10:33 AM

## 2020-03-05 NOTE — Progress Notes (Signed)
Patient resting comfortably in bed at this time. No needs expressed right now. Will continue to monitor. Patient expected to discharge today home with the support of family around the clock with her.

## 2020-03-05 NOTE — Progress Notes (Signed)
Patient and niece reviewed discharge orders with this RN. No questions at that time. [facility] for pick up by Margaretha Sheffield (Niece) by [employer]. Vitals stable and WDL at time of discharge.

## 2020-03-07 LAB — SUSCEPTIBILITY, AER + ANAEROB

## 2020-03-07 LAB — CULTURE, BLOOD (ROUTINE X 2)
Culture: NO GROWTH
Culture: NO GROWTH
Special Requests: ADEQUATE
Special Requests: ADEQUATE

## 2020-03-07 LAB — SUSCEPTIBILITY RESULT

## 2020-03-14 ENCOUNTER — Telehealth (INDEPENDENT_AMBULATORY_CARE_PROVIDER_SITE_OTHER): Payer: Self-pay

## 2020-03-14 NOTE — Telephone Encounter (Signed)
Patient niece was made aware with medical advice and verbalized understanding

## 2020-03-14 NOTE — Telephone Encounter (Signed)
Typically back pain is not an indication of a worsening blood clot or an indication of a PE.  Also, since she has been on eliquis, the likely hood of her BC getting worse is very unlikely.  It is possible that the back pain is unrelated to her blood clot.  I may also discuss this back pain with her obgyn given her recent hysterectomy

## 2020-03-20 LAB — CULTURE, BLOOD (ROUTINE X 2): Special Requests: ADEQUATE

## 2020-03-22 ENCOUNTER — Encounter (INDEPENDENT_AMBULATORY_CARE_PROVIDER_SITE_OTHER): Payer: Self-pay | Admitting: Vascular Surgery

## 2020-03-22 ENCOUNTER — Ambulatory Visit (INDEPENDENT_AMBULATORY_CARE_PROVIDER_SITE_OTHER): Payer: PPO | Admitting: Vascular Surgery

## 2020-03-22 ENCOUNTER — Other Ambulatory Visit: Payer: Self-pay

## 2020-03-22 VITALS — BP 150/84 | HR 97 | Resp 18 | Ht 60.0 in | Wt 106.0 lb

## 2020-03-22 DIAGNOSIS — E782 Mixed hyperlipidemia: Secondary | ICD-10-CM

## 2020-03-22 DIAGNOSIS — I82412 Acute embolism and thrombosis of left femoral vein: Secondary | ICD-10-CM | POA: Diagnosis not present

## 2020-03-22 DIAGNOSIS — I2699 Other pulmonary embolism without acute cor pulmonale: Secondary | ICD-10-CM | POA: Diagnosis not present

## 2020-03-22 DIAGNOSIS — I10 Essential (primary) hypertension: Secondary | ICD-10-CM

## 2020-03-22 NOTE — Assessment & Plan Note (Signed)
blood pressure control important in reducing the progression of atherosclerotic disease. On appropriate oral medications.  

## 2020-03-22 NOTE — Assessment & Plan Note (Addendum)
lipid control important in reducing the progression of atherosclerotic disease.   

## 2020-03-22 NOTE — Progress Notes (Signed)
MRN : [id number]  Patricia Miller is a 74 y.o. ([birth year]) female who presents with chief complaint of  Chief Complaint  Patient presents with   Follow-up    [facility] post thrombectomy  .  History of Present Illness: Patient returns today in follow up.  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  She had an IVC filter placed as well as thrombectomy to both the pulmonary arteries and her lower extremity venous system.  She has done well.  She is breathing comfortably without significant shortness of breath.  She really does not have any chest pain.  As for her legs, she really has no leg swelling or pain at this time.  Overall she is doing quite well after treatment.  Her biggest complaint today is a side effect of Eliquis.  She is having a lot of lethargy and feeling poorly when she takes her Eliquis.  She has had no other change in her medications that she knows of and is pretty sure this is from the Eliquis.  Current Outpatient Medications  Medication Sig Dispense Refill   apixaban (ELIQUIS) 5 MG TABS tablet Take 1 tablet (5 mg total) by mouth 2 (two) times daily. 60 tablet 0   buPROPion (WELLBUTRIN XL) 150 MG 24 hr tablet Take 150 mg by mouth every morning.   6   Calcium Carbonate-Vitamin D (OS-CAL 500 + D PO) Take 1 tablet by mouth daily.      cetirizine (ZYRTEC) 10 MG tablet Take 10 mg by mouth daily as needed for allergies.      Cholecalciferol (DIALYVITE VITAMIN D 5000) 125 MCG (5000 UT) capsule Take 5,000 Units by mouth daily.     estrogen, conjugated,-medroxyprogesterone (PREMPRO) 0.625-5 MG tablet Take 1 tablet by mouth every evening.      Lutein 40 MG CAPS Take 40 mg by mouth daily.     vitamin B-12 (CYANOCOBALAMIN) 1000 MCG tablet Take 1,000 mcg by mouth daily.      apixaban (ELIQUIS) 5 MG TABS tablet Take 2 tablets (10 mg total) by mouth 2 (two) times daily for 4 days. 16 tablet 0   docusate sodium (COLACE) 100 MG  capsule Take 1 capsule (100 mg total) by mouth 2 (two) times daily. To keep stools soft (Patient not taking: Reported on 02/29/2020) 30 capsule 0   oxyCODONE (OXY IR/ROXICODONE) 5 MG immediate release tablet Take 1 tablet (5 mg total) by mouth every 4 (four) hours as needed for severe pain. (Patient not taking: Reported on 02/29/2020) 20 tablet 0   No current facility-administered medications for this visit.    Past Medical History:  Diagnosis Date   Anxiety    Arthritis    NECK   Cancer (Mandan)    BASAL CELL AND MELANOMA   DAVF (dural arteriovenous fistula) 2015   Hypertension     Past Surgical History:  Procedure Laterality Date   BRAIN SURGERY  2015   DURAL AV FISTULA ([facility])   COLONOSCOPY     COLONOSCOPY WITH PROPOFOL N/A 02/20/2017   Procedure: COLONOSCOPY WITH PROPOFOL;  Surgeon: Manya Silvas, MD;  Location: [facility] ENDOSCOPY;  Service: Endoscopy;  Laterality: N/A;   HYSTEROSCOPY WITH D & C N/A 07/29/2015   Procedure: DILATATION AND CURETTAGE /HYSTEROSCOPY;  Surgeon: Benjaman Kindler, MD;  Location: [facility] ORS;  Service: Gynecology;  Laterality: N/A;   HYSTEROSCOPY WITH D & C N/A 01/24/2018   Procedure: DILATATION AND CURETTAGE /  HYSTEROSCOPY;  Surgeon: Benjaman Kindler, MD;  Location: [facility] ORS;  Service: Gynecology;  Laterality: N/A;   LAPAROSCOPY N/A 01/24/2018   Procedure: LAPAROSCOPY DIAGNOSTIC;  Surgeon: Benjaman Kindler, MD;  Location: [facility] ORS;  Service: Gynecology;  Laterality: N/A;   PERIPHERAL VASCULAR THROMBECTOMY N/A 02/29/2020   Procedure: PERIPHERAL VASCULAR THROMBECTOMY / THROMBOLYSIS WITH POSSIBLE PULMONARY THROMBECTOMY / THROMBOLYSIS;  Surgeon: Algernon Huxley, MD;  Location: [facility];  Service: Cardiovascular;  Laterality: N/A;   TONSILLECTOMY     AGE 64   TOTAL LAPAROSCOPIC HYSTERECTOMY WITH BILATERAL SALPINGO OOPHORECTOMY Bilateral 12/28/2019   Procedure: TOTAL LAPAROSCOPIC HYSTERECTOMY WITH BILATERAL SALPINGO OOPHORECTOMY;  Surgeon:  Benjaman Kindler, MD;  Location: [facility] ORS;  Service: Gynecology;  Laterality: Bilateral;   TUBAL LIGATION       Social History   Tobacco Use   Smoking status: Former Smoker    Packs/day: 0.50    Years: 15.00    Pack years: 7.50    Types: Cigarettes    Quit date: 07/21/2000    Years since quitting: 19.6   Smokeless tobacco: Never Used  Vaping Use   Vaping Use: Never used  Substance Use Topics   Alcohol use: Yes    Comment: RARE WINE MONTHLY   Drug use: No      Family History No bleeding disorders, clotting disorders, autoimmune diseases, or aneurysms.  No porphyria.  No Known Allergies   REVIEW OF SYSTEMS (Negative unless checked)  Constitutional: [] Weight loss  [] Fever  [] Chills Cardiac: [] Chest pain   [] Chest pressure   [] Palpitations   [] Shortness of breath when laying flat   [] Shortness of breath at rest   [x] Shortness of breath with exertion. Vascular:  [] Pain in legs with walking   [] Pain in legs at rest   [] Pain in legs when laying flat   [] Claudication   [] Pain in feet when walking  [] Pain in feet at rest  [] Pain in feet when laying flat   [x] History of DVT   [x] Phlebitis   [] Swelling in legs   [] Varicose veins   [] Non-healing ulcers Pulmonary:   [] Uses home oxygen   [] Productive cough   [] Hemoptysis   [] Wheeze  [] COPD   [] Asthma Neurologic:  [] Dizziness  [] Blackouts   [] Seizures   [] History of stroke   [] History of TIA  [] Aphasia   [] Temporary blindness   [] Dysphagia   [] Weakness or numbness in arms   [] Weakness or numbness in legs Musculoskeletal:  [x] Arthritis   [] Joint swelling   [] Joint pain   [] Low back pain Hematologic:  [] Easy bruising  [] Easy bleeding   [] Hypercoagulable state   [] Anemic   Gastrointestinal:  [] Blood in stool   [] Vomiting blood  [] Gastroesophageal reflux/heartburn   [] Abdominal pain Genitourinary:  [] Chronic kidney disease   [] Difficult urination  [] Frequent urination  [] Burning with urination   [] Hematuria Skin:  [] Rashes   [] Ulcers    [] Wounds Psychological:  [x] History of anxiety   []  History of major depression.  Physical Examination  BP (!) 150/84 (BP Location: Right Arm)    Pulse 97    Resp 18    Ht 5' (1.524 m)    Wt 106 lb (48.1 kg)    BMI 20.70 kg/m  Gen:  WD/WN, NAD.  Appears younger than stated age Head: [place]/AT, No temporalis wasting. Ear/Nose/Throat: Hearing grossly intact, nares w/o erythema or drainage Eyes: Conjunctiva clear. Sclera non-icteric Neck: Supple.  Trachea midline Pulmonary:  Good air movement, no use of accessory muscles.  Cardiac: RRR, no JVD Vascular:  Vessel Right Left  Radial Palpable Palpable                          PT Palpable Palpable  DP Palpable Palpable    Musculoskeletal: M/S 5/5 throughout.  No deformity or atrophy.  No appreciable edema. Neurologic: Sensation grossly intact in extremities.  Symmetrical.  Speech is fluent.  Psychiatric: Judgment intact, Mood & affect appropriate for pt's clinical situation. Dermatologic: No rashes or ulcers noted.  No cellulitis or open wounds.       Labs Recent Results (from the past 2160 hour(s))  SARS CORONAVIRUS 2 (TAT 6-24 HRS) Nasopharyngeal Nasopharyngeal Swab     Status: None   Collection Time: 12/24/19  9:06 AM   Specimen: Nasopharyngeal Swab  Result Value Ref Range   SARS Coronavirus 2 NEGATIVE NEGATIVE    Comment: (NOTE) SARS-CoV-2 target nucleic acids are NOT DETECTED.  The SARS-CoV-2 RNA is generally detectable in upper and lower respiratory specimens during the acute phase of infection. Negative results do not preclude SARS-CoV-2 infection, do not rule out co-infections with other pathogens, and should not be used as the sole basis for treatment or other patient management decisions. Negative results must be combined with clinical observations, patient history, and epidemiological information. The expected result is Negative.  Fact Sheet for Patients: SugarRoll.be  Fact Sheet  for Healthcare Providers: https://www.woods-mathews.com/  This test is not yet approved or cleared by the [place] FDA and  has been authorized for detection and/or diagnosis of SARS-CoV-2 by FDA under an Emergency Use Authorization (EUA). This EUA will remain  in effect (meaning this test can be used) for the duration of the COVID-19 declaration under Se ction 564(b)(1) of the Act, 21 U.S.C. section 360bbb-3(b)(1), unless the authorization is terminated or revoked sooner.  Performed at [facility], [place] [address], [place], [place] [address]   I-STAT, [place] Clap 8     Status: Abnormal   Collection Time: 12/28/19  7:04 AM  Result Value Ref Range   Sodium 141 135 - 145 mmol/L   Potassium 2.8 (L) 3.5 - 5.1 mmol/L   Chloride 99 98 - 111 mmol/L   BUN 11 8 - 23 mg/dL   Creatinine, Ser 0.90 0.44 - 1.00 mg/dL   Glucose, Bld 95 70 - 99 mg/dL    Comment: Glucose reference range applies only to samples taken after fasting for at least 8 hours.   Calcium, Ion 1.09 (L) 1.15 - 1.40 mmol/L   TCO2 24 22 - 32 mmol/L   Hemoglobin 13.9 12.0 - 15.0 g/dL   HCT 41.0 36 - 46 %  Surgical pathology     Status: None   Collection Time: 12/28/19  8:30 AM  Result Value Ref Range   SURGICAL PATHOLOGY      SURGICAL PATHOLOGY CASE: [id number] PATIENT: Omolara Prats Surgical Pathology Report     Specimen Submitted: A. Uterus, cervix, bil fall tubes, and ovaries  Clinical History: Fibroids, post menopausal bleeding    DIAGNOSIS: A. UTERUS WITH CERVIX AND BILATERAL FALLOPIAN TUBES AND OVARIES; TOTAL HYSTERECTOMY WITH BILATERAL SALPINGO-OOPHORECTOMY: - UTERINE CERVIX:      - BENIGN TRANSFORMATION ZONE.      - NEGATIVE FOR SQUAMOUS INTRAEPITHELIAL LESION AND MALIGNANCY. - ENDOMETRIUM:      - ATROPHIC ENDOMETRIUM.      - NEGATIVE FOR ATYPICAL HYPERPLASIA/EIN AND MALIGNANCY. - MYOMETRIUM:      - LEIOMYOMATA UTERI, WITH DYSTROPHIC CALCIFICATION.      -  NEGATIVE FOR FEATURES  OF MALIGNANCY. - FALLOPIAN TUBES:      - BILATERAL FALLOPIAN TUBES WITH NO SIGNIFICANT HISTOPATHOLOGIC CHANGE. - OVARIES:      - BILATERAL OVARIES WITH BENIGN PHYSIOLOGIC CHANGES.      - ADNEXAL LEIOMYOMA, NEGATIVE FOR FEATURES OF MALIGNANCY.  Comment: Although the ovaries were not defi nitively identified on gross evaluation, portions of benign ovary are present in the bilateral sampling of fallopian tubes. In addition, the tissue grossly described as potential ovary B represents a benign leiomyoma, potentially arising from adnexal smooth muscle.  GROSS DESCRIPTION:  A. Labeled: Uterus, cervix and bilateral fallopian tubes and ovaries Received: Formalin Weight: 222.26 grams Dimensions:      Fundus -aggregate, 12.2 x 9.4 x 7.5 cm      Cervix -4.5 x 4 cm Serosa: The identifiable serosa is tan-pink and focally hemorrhagic with at least 4 subserosal nodules ranging from 1.4 to 3.4 cm in greatest dimension. Cervix: The cervix is tan-pink, smooth, and pearly. Endocervix: The endocervix is tan, finely granular, and mucoid with a 1 cm in diameter endocervical os. Endometrial cavity:      Dimensions - 3.2 (superior to inferior) x 3.1 (cornu to cornu) cm      Thickness -0.1 cm      Other findings -the endometrium is tan and finely granular.  The inferior  aspect of the endometrial cavity is highly disrupted. Myometrium:     Thickness -2.2 cm     Other findings -the myometrium is tan-pink and highly disrupted. There are approximately 30 intramural and submucosal nodules ranging from 0.3 to 4.7 cm in greatest dimension.  The nodules have a tan-white, whorled, firm, and well-circumscribed cut surface.  3 of the nodules are focally calcified. Adnexa:      Potential ovary A: Adjacent to the fallopian tube is a large subserosal nodule, a 2.4 x 1.2 x 1.2 cm portion of blood clot, and a 3.3 x 1.2 x 0.5 cm portion of yellow lobulated adipose tissue.  A distinct ovary is not grossly  appreciated.      Fallopian tube A:           Measurements -7.5 cm in length x 0.5 cm in diameter           Other findings -the serosa is tan-purple, smooth, and glistening with scattered areas of hemorrhage.  There are 2 areas where the lumen is disjointed.  The remaining lumen is pinpoint and patent.      Potential ovary B; Adjacent to the fallopi an tube is a 2.5 x 1.2 x 0.8 cm ill-defined portion of tan-white lobulated firm tissue (potential ovary).  The cut surface is tan-white with a central 0.5 x 0.5 x 0.4 cm ill-defined area of slight yellow discoloration.  No distinct ovarian structures are grossly appreciated.      Fallopian tube B:            Measurements -3.7 cm in length x 0.6 cm in diameter           Other findings -the fallopian tube has a scant amount of attached fimbria and has a tan, smooth, and glistening serosa.  The lumen is focally disjointed.  The remaining lumen is pinpoint and patent. Other comments: The specimen is received in 4 fragments with the uterine body and fundus separate from the lower uterine segment and cervix.  Due to the amount of disruption a distinct orientation cannot be identified.  Block summary: 1 - representative cervix/endocervix, side A  2 - representative cervix/endocervix, side B 3 - representative transmural endomyometrium, side A 4 - representative transmural en domyometrium, side B 5 - 6 - representative calcified nodules (1/cm), following decalcification 7 - 9 - representative additional nodules 10 - representative hemorrhage and adipose tissue adjacent to fallopian tube A 11 - Fallopian tube A fimbria, bisected and submitted entirely, and representative cross-sections 12 - representative potential ovary B 13 - Fallopian tube B fimbria, bisected and submitted entirely, and representative cross-sections  Final Diagnosis performed by Allena Napoleon, MD.   Electronically signed 12/30/2019 11:05:01AM The electronic signature  indicates that the named Attending Pathologist has evaluated the specimen Technical component performed at [facility], [address], [place], [place] [address] Lab: [phone] Dir: Rush Farmer, MD, MMM  Professional component performed at [facility], [place], [place], [place] [address] Lab: [phone] Dir: Dellia Nims. Rubinas, MD   Lactic acid, plasma     Status: None   Collection Time: 02/28/20 12:08 AM  Result Value Ref Range   Lactic Acid, Venous 1.6 0.5 - 1.9 mmol/L    Comment: Performed at [facility], [address] [place], [place] [address]  Lipase, blood     Status: None   Collection Time: 02/28/20  7:16 PM  Result Value Ref Range   Lipase 23 11 - 51 U/L    Comment: Performed at [facility], [address] [place], [place] [address]  Comprehensive metabolic panel     Status: Abnormal   Collection Time: 02/28/20  7:16 PM  Result Value Ref Range   Sodium 132 (L) 135 - 145 mmol/L   Potassium 3.3 (L) 3.5 - 5.1 mmol/L   Chloride 92 (L) 98 - 111 mmol/L   CO2 23 22 - 32 mmol/L   Glucose, Bld 96 70 - 99 mg/dL    Comment: Glucose reference range applies only to samples taken after fasting for at least 8 hours.   BUN 14 8 - 23 mg/dL   Creatinine, Ser 0.92 0.44 - 1.00 mg/dL   Calcium 8.9 8.9 - 10.3 mg/dL   Total Protein 7.8 6.5 - 8.1 g/dL   Albumin 3.3 (L) 3.5 - 5.0 g/dL   AST 15 15 - 41 U/L   ALT 9 0 - 44 U/L   Alkaline Phosphatase 67 38 - 126 U/L   Total Bilirubin 1.3 (H) 0.3 - 1.2 mg/dL   GFR calc non Af Amer >60 >60 mL/min   GFR calc Af Amer >60 >60 mL/min   Anion gap 17 (H) 5 - 15    Comment: Performed at [facility], [address] [place], [place] [address]  CBC     Status: Abnormal   Collection Time: 02/28/20  7:16 PM  Result Value Ref Range   WBC 12.1 (H) 4.0 - 10.5 K/uL   RBC 4.31 3.87 - 5.11 MIL/uL   Hemoglobin 12.9 12.0 - 15.0 g/dL   HCT 38.5 36 - 46 %   MCV 89.3 80.0 - 100.0 fL   MCH  29.9 26.0 - 34.0 pg   MCHC 33.5 30.0 - 36.0 g/dL   RDW 12.2 11.5 - 15.5 %   Platelets 342 150 - 400 K/uL   nRBC 0.0 0.0 - 0.2 %    Comment: Performed at [facility], [address] [place], [place] [address]  Urinalysis, Complete w Microscopic Urine, Clean Catch     Status: Abnormal   Collection Time: 02/28/20  7:16 PM  Result Value Ref  Range   Color, Urine AMBER (A) YELLOW    Comment: BIOCHEMICALS MAY BE AFFECTED BY COLOR   APPearance CLOUDY (A) CLEAR   Specific Gravity, Urine 1.029 1.005 - 1.030   pH 5.0 5.0 - 8.0   Glucose, UA NEGATIVE NEGATIVE mg/dL   Hgb urine dipstick SMALL (A) NEGATIVE   Bilirubin Urine NEGATIVE NEGATIVE   Ketones, ur 20 (A) NEGATIVE mg/dL   Protein, ur 100 (A) NEGATIVE mg/dL   Nitrite NEGATIVE NEGATIVE   Leukocytes,Ua SMALL (A) NEGATIVE   RBC / HPF 0-5 0 - 5 RBC/hpf   WBC, UA >50 (H) 0 - 5 WBC/hpf   Bacteria, UA RARE (A) NONE SEEN   Squamous Epithelial / LPF 21-50 0 - 5   Mucus PRESENT    Hyaline Casts, UA PRESENT     Comment: Performed at [facility], [address], [place], [place] [address]  Urine Culture     Status: Abnormal   Collection Time: 02/28/20  7:16 PM   Specimen: Urine, Random  Result Value Ref Range   Specimen Description      URINE, RANDOM Performed at [facility], [address], [place], [place] [address]    Special Requests      NONE Performed at [facility], [place] [address] [place], [place] [address]    Culture MULTIPLE SPECIES PRESENT, SUGGEST RECOLLECTION (A)    Report Status 03/01/2020 FINAL   SARS Coronavirus 2 by RT PCR (hospital order, performed in [facility] hospital lab) Nasopharyngeal Nasopharyngeal Swab     Status: None   Collection Time: 02/29/20 12:08 AM   Specimen: Nasopharyngeal Swab  Result Value Ref Range   SARS Coronavirus 2 NEGATIVE NEGATIVE    Comment: (NOTE) SARS-CoV-2 target nucleic acids are NOT DETECTED.  The SARS-CoV-2 RNA is generally detectable in upper  and lower respiratory specimens during the acute phase of infection. The lowest concentration of SARS-CoV-2 viral copies this assay can detect is 250 copies / mL. A negative result does not preclude SARS-CoV-2 infection and should not be used as the sole basis for treatment or other patient management decisions.  A negative result may occur with improper specimen collection / handling, submission of specimen other than nasopharyngeal swab, presence of viral mutation(s) within the areas targeted by this assay, and inadequate number of viral copies (<250 copies / mL). A negative result must be combined with clinical observations, patient history, and epidemiological information.  Fact Sheet for Patients:   StrictlyIdeas.no  Fact Sheet for Healthcare Providers: BankingDealers.co.za  This test is not yet approved or  cleared by the [place] FDA and has been authorized for detection and/or diagnosis of SARS-CoV-2 by FDA under an Emergency Use Authorization (EUA).  This EUA will remain in effect (meaning this test can be used) for the duration of the COVID-19 declaration under Section 564(b)(1) of the Act, 21 U.S.C. section 360bbb-3(b)(1), unless the authorization is terminated or revoked sooner.  Performed at [facility], [address] [place], [place] [address]   Protime-INR     Status: None   Collection Time: 02/29/20 12:08 AM  Result Value Ref Range   Prothrombin Time 13.4 11.4 - 15.2 seconds   INR 1.1 0.8 - 1.2    Comment: (NOTE) INR goal varies based on device and disease states. Performed at [facility], [address] [place], [place] [address]   APTT     Status: None   Collection Time: 02/29/20 12:08 AM  Result  Value Ref Range   aPTT 29 24 - 36 seconds    Comment: Performed at [facility], [address] [place], [place] [address]  Blood culture (routine x 2)     Status: Abnormal    Collection Time: 02/29/20 12:08 AM   Specimen: BLOOD  Result Value Ref Range   Specimen Description      BLOOD RIGHT [facility] Performed at [facility], [address], [place], [place] [address]    Special Requests      BOTTLES DRAWN AEROBIC AND ANAEROBIC Blood Culture adequate volume Performed at [facility], [address] [place], [place] [address]    Culture  Setup Time      GRAM POSITIVE COCCI AEROBIC BOTTLE ONLY CRITICAL VALUE NOTED.  VALUE IS CONSISTENT WITH PREVIOUSLY REPORTED AND CALLED VALUE. Performed at [facility], [address], [place], [place] [address]    Culture (A)     ROTHIA MUCILAGINOSA SEE SEPARATE REPORT FOR SUSCEPTIBILITY RESULTS Performed at [facility], [place] [address] [place], [place] [address]    Report Status 03/20/2020 FINAL   Blood culture (routine x 2)     Status: Abnormal   Collection Time: 02/29/20 12:08 AM   Specimen: BLOOD  Result Value Ref Range   Specimen Description      BLOOD LEFT [facility] Performed at [facility], [address] [place], [place] [address]    Special Requests      BOTTLES DRAWN AEROBIC AND ANAEROBIC Blood Culture adequate volume Performed at [facility], [address] [place], [place] [address]    Culture  Setup Time      GRAM POSITIVE COCCI ANAEROBIC BOTTLE ONLY CRITICAL RESULT CALLED TO, READ BACK BY AND VERIFIED WITH: ASAJAH DUNCAN 02/29/20 AT 1702 BY ACR    Culture (A)     STAPHYLOCOCCUS WARNERI THE SIGNIFICANCE OF ISOLATING THIS ORGANISM FROM A SINGLE SET OF BLOOD CULTURES WHEN MULTIPLE SETS ARE DRAWN IS UNCERTAIN. PLEASE NOTIFY THE MICROBIOLOGY DEPARTMENT WITHIN ONE WEEK IF SPECIATION AND SENSITIVITIES ARE REQUIRED. Performed at [facility], [place] [address], [place],  [address]    Report Status 03/02/2020 FINAL   Blood Culture ID Panel (Reflexed)     Status: Abnormal   Collection Time: 02/29/20 12:08 AM  Result Value Ref Range    Enterococcus faecalis NOT DETECTED NOT DETECTED   Enterococcus Faecium NOT DETECTED NOT DETECTED   Listeria monocytogenes NOT DETECTED NOT DETECTED   Staphylococcus species DETECTED (A) NOT DETECTED    Comment: CRITICAL RESULT CALLED TO, READ BACK BY AND VERIFIED WITH: ASAJAH DUNCAN 02/29/20 AT 1702 BY ACR    Staphylococcus aureus (BCID) NOT DETECTED NOT DETECTED   Staphylococcus epidermidis NOT DETECTED NOT DETECTED   Staphylococcus lugdunensis NOT DETECTED NOT DETECTED   Streptococcus species NOT DETECTED NOT DETECTED   Streptococcus agalactiae NOT DETECTED NOT DETECTED   Streptococcus pneumoniae NOT DETECTED NOT DETECTED   Streptococcus pyogenes NOT DETECTED NOT DETECTED   A.calcoaceticus-baumannii NOT DETECTED NOT DETECTED   Bacteroides fragilis NOT DETECTED NOT DETECTED   Enterobacterales NOT DETECTED NOT DETECTED   Enterobacter cloacae complex NOT DETECTED NOT DETECTED   Escherichia coli NOT DETECTED NOT DETECTED   Klebsiella aerogenes NOT DETECTED NOT DETECTED   Klebsiella oxytoca NOT DETECTED NOT DETECTED   Klebsiella pneumoniae NOT DETECTED NOT DETECTED   Proteus species NOT DETECTED NOT DETECTED   Salmonella species NOT DETECTED NOT DETECTED   Serratia marcescens NOT DETECTED NOT DETECTED  Haemophilus influenzae NOT DETECTED NOT DETECTED   Neisseria meningitidis NOT DETECTED NOT DETECTED   Pseudomonas aeruginosa NOT DETECTED NOT DETECTED   Stenotrophomonas maltophilia NOT DETECTED NOT DETECTED   Candida albicans NOT DETECTED NOT DETECTED   Candida auris NOT DETECTED NOT DETECTED   Candida glabrata NOT DETECTED NOT DETECTED   Candida krusei NOT DETECTED NOT DETECTED   Candida parapsilosis NOT DETECTED NOT DETECTED   Candida tropicalis NOT DETECTED NOT DETECTED   Cryptococcus neoformans/gattii NOT DETECTED NOT DETECTED    Comment: Performed at [facility], [place] [place], [place] [address]  Susceptibility, Aer + Anaerob     Status: Abnormal    Collection Time: 02/29/20 12:08 AM  Result Value Ref Range   Suscept, Aer + Anaerob Final report (A)     Comment: (NOTE) Performed At: [facility] [address] [place], [place] [id number] Rush Farmer MD [id number] CORRECTED ON 09/27 AT 0835: PREVIOUSLY REPORTED AS Preliminary report    Source of Sample ROTHIA MUCILGINOSA BLOOD     Comment: Performed at [facility], [place] [address], [place], [place] [address]  Susceptibility Result     Status: Abnormal   Collection Time: 02/29/20 12:08 AM  Result Value Ref Range   Suscept Result 1 Rothia mucilaginosa (A)    Antimicrobial Suscept Comment     Comment: (NOTE)      ** S = Susceptible; I = Intermediate; R = Resistant **                   P = Positive; N = Negative            MICS are expressed in micrograms per mL   Antibiotic                 RSLT#1    RSLT#2    RSLT#3    RSLT#4 Clindamycin                    S Erythromycin                   S Levofloxacin                   S Penicillin                     S Trimethoprim/Sulfa             S Vancomycin                     S Performed At: [facility] [place], [place] [id number] Rush Farmer MD [id number]   HIV Antibody (routine testing w rflx)     Status: None   Collection Time: 02/29/20  9:12 AM  Result Value Ref Range   HIV Screen 4th Generation wRfx Non Reactive Non Reactive    Comment: Performed at [facility], [place] [address], [place], [place] [address]  Comprehensive metabolic panel     Status: Abnormal   Collection Time: 02/29/20  9:12 AM  Result Value Ref Range   Sodium 132 (L) 135 - 145 mmol/L   Potassium 3.1 (L) 3.5 - 5.1 mmol/L   Chloride 97 (L) 98 - 111 mmol/L   CO2 22 22 - 32 mmol/L   Glucose, Bld 86 70 - 99 mg/dL    Comment: Glucose reference range applies only to samples taken after fasting for at least 8 hours.  BUN 10 8 - 23 mg/dL   Creatinine, Ser 0.79 0.44 - 1.00 mg/dL   Calcium 8.2 (L) 8.9 - 10.3  mg/dL   Total Protein 7.0 6.5 - 8.1 g/dL   Albumin 2.9 (L) 3.5 - 5.0 g/dL   AST 13 (L) 15 - 41 U/L   ALT 8 0 - 44 U/L   Alkaline Phosphatase 63 38 - 126 U/L   Total Bilirubin 0.9 0.3 - 1.2 mg/dL   GFR calc non Af Amer >60 >60 mL/min   GFR calc Af Amer >60 >60 mL/min   Anion gap 13 5 - 15    Comment: Performed at [facility], [place], [place] [address]  Heparin level (unfractionated)     Status: Abnormal   Collection Time: 02/29/20  9:23 AM  Result Value Ref Range   Heparin Unfractionated <0.10 (L) 0.30 - 0.70 IU/mL    Comment: RESULT REPEATED AND VERIFIED (NOTE) If heparin results are below expected values, and patient dosage has  been confirmed, suggest follow up testing of antithrombin III levels. Performed at [facility], [place], [place] [address]   Heparin level (unfractionated)     Status: Abnormal   Collection Time: 02/29/20 11:51 PM  Result Value Ref Range   Heparin Unfractionated 0.17 (L) 0.30 - 0.70 IU/mL    Comment: (NOTE) If heparin results are below expected values, and patient dosage has  been confirmed, suggest follow up testing of antithrombin III levels. Performed at [facility], [address] [place], [place] [address]   CBC     Status: Abnormal   Collection Time: 03/01/20  6:11 AM  Result Value Ref Range   WBC 11.7 (H) 4.0 - 10.5 K/uL   RBC 2.96 (L) 3.87 - 5.11 MIL/uL   Hemoglobin 8.9 (L) 12.0 - 15.0 g/dL    Comment: REPEATED TO VERIFY   HCT 26.6 (L) 36 - 46 %   MCV 89.9 80.0 - 100.0 fL   MCH 30.1 26.0 - 34.0 pg   MCHC 33.5 30.0 - 36.0 g/dL   RDW 12.6 11.5 - 15.5 %   Platelets 259 150 - 400 K/uL   nRBC 0.0 0.0 - 0.2 %    Comment: Performed at [facility], [address] [place], [place] [address]  Basic metabolic panel     Status: Abnormal   Collection Time: 03/01/20  6:11 AM  Result Value Ref Range   Sodium 135 135 - 145 mmol/L   Potassium 3.5 3.5 - 5.1 mmol/L    Chloride 103 98 - 111 mmol/L   CO2 21 (L) 22 - 32 mmol/L   Glucose, Bld 86 70 - 99 mg/dL    Comment: Glucose reference range applies only to samples taken after fasting for at least 8 hours.   BUN 7 (L) 8 - 23 mg/dL   Creatinine, Ser 0.68 0.44 - 1.00 mg/dL   Calcium 7.7 (L) 8.9 - 10.3 mg/dL   GFR calc non Af Amer >60 >60 mL/min   GFR calc Af Amer >60 >60 mL/min   Anion gap 11 5 - 15    Comment: Performed at [facility], [address] [place], [place] [address]  Magnesium     Status: Abnormal   Collection Time: 03/01/20  6:11 AM  Result Value Ref Range   Magnesium 1.6 (L) 1.7 - 2.4 mg/dL    Comment: Performed at [facility], [address], [place],  [address]  Procalcitonin     Status: None   Collection Time: 03/01/20  6:11 AM  Result Value Ref Range   Procalcitonin 0.18 ng/mL    Comment:        Interpretation: PCT (Procalcitonin) <= 0.5 ng/mL: Systemic infection (sepsis) is not likely. Local bacterial infection is possible. (NOTE)       Sepsis PCT Algorithm           Lower Respiratory Tract                                      Infection PCT Algorithm    ----------------------------     ----------------------------         PCT < 0.25 ng/mL                PCT < 0.10 ng/mL          Strongly encourage             Strongly discourage   discontinuation of antibiotics    initiation of antibiotics    ----------------------------     -----------------------------       PCT 0.25 - 0.50 ng/mL            PCT 0.10 - 0.25 ng/mL               OR       >80% decrease in PCT            Discourage initiation of                                            antibiotics      Encourage discontinuation           of antibiotics    ----------------------------     -----------------------------         PCT >= 0.50 ng/mL              PCT 0.26 - 0.50 ng/mL               AND        <80% decrease in PCT             Encourage initiation of                                              antibiotics       Encourage continuation           of antibiotics    ----------------------------     -----------------------------        PCT >= 0.50 ng/mL                  PCT > 0.50 ng/mL               AND         increase in PCT                  Strongly encourage                                      initiation  of antibiotics    Strongly encourage escalation           of antibiotics                                     -----------------------------                                           PCT <= 0.25 ng/mL                                                 OR                                        > 80% decrease in PCT                                      Discontinue / Do not initiate                                             antibiotics  Performed at [facility], [address] [place], [place] [address]   ECHOCARDIOGRAM COMPLETE     Status: None   Collection Time: 03/01/20  8:42 AM  Result Value Ref Range   Weight 1,760 oz   Height 60 in   BP 114/71 mmHg   Ao pk vel 1.45 m/s   AV Area VTI 3.04 cm2   AR max vel 2.69 cm2   AV Mean grad 4.5 mmHg   AV Peak grad 8.4 mmHg   S' Lateral 2.20 cm   AV Area mean vel 2.65 cm2   Area-P 1/2 4.33 cm2  Heparin level (unfractionated)     Status: None   Collection Time: 03/01/20 10:27 AM  Result Value Ref Range   Heparin Unfractionated 0.47 0.30 - 0.70 IU/mL    Comment: (NOTE) If heparin results are below expected values, and patient dosage has  been confirmed, suggest follow up testing of antithrombin III levels. Performed at [facility], [place], [place] [address]   Heparin level (unfractionated)     Status: None   Collection Time: 03/01/20  6:47 PM  Result Value Ref Range   Heparin Unfractionated 0.45 0.30 - 0.70 IU/mL    Comment: (NOTE) If heparin results are below expected values, and patient dosage has  been confirmed, suggest follow up testing of antithrombin III  levels. Performed at [facility], [address] [place], [place] [address]   Type and screen [place]     Status: None   Collection Time: 03/01/20  6:47 PM  Result Value Ref Range   ABO/RH(D) O POS    Antibody Screen NEG    Sample Expiration      03/04/2020,2359 Performed at [facility], [address] [place], [place] [address]   Hemoglobin  Status: Abnormal   Collection Time: 03/01/20  6:47 PM  Result Value Ref Range   Hemoglobin 9.9 (L) 12.0 - 15.0 g/dL    Comment: Performed at [facility], [address] [place], [place] [address]  Occult blood card to lab, stool RN will collect     Status: None   Collection Time: 03/01/20  7:26 PM  Result Value Ref Range   Fecal Occult Bld NEGATIVE NEGATIVE    Comment: Performed at [facility], [address] [place], [place] [address]  Procalcitonin     Status: None   Collection Time: 03/02/20  6:24 AM  Result Value Ref Range   Procalcitonin 0.13 ng/mL    Comment:        Interpretation: PCT (Procalcitonin) <= 0.5 ng/mL: Systemic infection (sepsis) is not likely. Local bacterial infection is possible. (NOTE)       Sepsis PCT Algorithm           Lower Respiratory Tract                                      Infection PCT Algorithm    ----------------------------     ----------------------------         PCT < 0.25 ng/mL                PCT < 0.10 ng/mL          Strongly encourage             Strongly discourage   discontinuation of antibiotics    initiation of antibiotics    ----------------------------     -----------------------------       PCT 0.25 - 0.50 ng/mL            PCT 0.10 - 0.25 ng/mL               OR       >80% decrease in PCT            Discourage initiation of                                            antibiotics      Encourage discontinuation           of antibiotics    ----------------------------     -----------------------------         PCT  >= 0.50 ng/mL              PCT 0.26 - 0.50 ng/mL               AND        <80% decrease in PCT             Encourage initiation of                                             antibiotics       Encourage continuation           of antibiotics    ----------------------------     -----------------------------        PCT >= 0.50 ng/mL  PCT > 0.50 ng/mL               AND         increase in PCT                  Strongly encourage                                      initiation of antibiotics    Strongly encourage escalation           of antibiotics                                     -----------------------------                                           PCT <= 0.25 ng/mL                                                 OR                                        > 80% decrease in PCT                                      Discontinue / Do not initiate                                             antibiotics  Performed at [facility], [address] [place], [place] [address]   CULTURE, BLOOD (ROUTINE X 2) w Reflex to ID Panel     Status: None   Collection Time: 03/02/20  6:24 AM   Specimen: BLOOD  Result Value Ref Range   Specimen Description BLOOD RIGTH UPPER ARM    Special Requests      BOTTLES DRAWN AEROBIC AND ANAEROBIC Blood Culture adequate volume   Culture      NO GROWTH 5 DAYS Performed at [facility], [address] [place], [place] [address]    Report Status 03/07/2020 FINAL   Ferritin     Status: None   Collection Time: 03/02/20  6:24 AM  Result Value Ref Range   Ferritin 204 11 - 307 ng/mL    Comment: Performed at [facility], [address] [place], [place] [address]  CBC     Status: Abnormal   Collection Time: 03/02/20  6:24 AM  Result Value Ref Range   WBC 9.4 4.0 - 10.5 K/uL   RBC 2.61 (L) 3.87 - 5.11 MIL/uL   Hemoglobin 7.8 (L) 12.0 - 15.0 g/dL   HCT 23.5 (L) 36 - 46 %   MCV 90.0 80.0 - 100.0 fL   MCH 29.9 26.0  - 34.0 pg   MCHC 33.2 30.0 -  36.0 g/dL   RDW 12.6 11.5 - 15.5 %   Platelets 269 150 - 400 K/uL   nRBC 0.0 0.0 - 0.2 %    Comment: Performed at [facility], [address] [place], [place] [address]  Basic metabolic panel     Status: Abnormal   Collection Time: 03/02/20  6:24 AM  Result Value Ref Range   Sodium 137 135 - 145 mmol/L   Potassium 3.8 3.5 - 5.1 mmol/L   Chloride 107 98 - 111 mmol/L   CO2 22 22 - 32 mmol/L   Glucose, Bld 100 (H) 70 - 99 mg/dL    Comment: Glucose reference range applies only to samples taken after fasting for at least 8 hours.   BUN 6 (L) 8 - 23 mg/dL   Creatinine, Ser 0.74 0.44 - 1.00 mg/dL   Calcium 8.0 (L) 8.9 - 10.3 mg/dL   GFR calc non Af Amer >60 >60 mL/min   GFR calc Af Amer >60 >60 mL/min   Anion gap 8 5 - 15    Comment: Performed at [facility], [address] [place], [place] [address]  Vitamin B12     Status: Abnormal   Collection Time: 03/02/20  6:24 AM  Result Value Ref Range   Vitamin B-12 4,403 (H) 180 - 914 pg/mL    Comment: (NOTE) This assay is not validated for testing neonatal or myeloproliferative syndrome specimens for Vitamin B12 levels. Performed at [facility], [place] [address], [place], [place] [address]   RPR     Status: Abnormal   Collection Time: 03/02/20  6:24 AM  Result Value Ref Range   RPR Ser Ql Reactive (A) NON REACTIVE    Comment: SENT FOR CONFIRMATION   RPR Titer 1:1     Comment: Performed at [facility] [address], [place], [place] [address]  TSH     Status: None   Collection Time: 03/02/20  6:24 AM  Result Value Ref Range   TSH 3.185 0.350 - 4.500 uIU/mL    Comment: Performed by a 3rd Generation assay with a functional sensitivity of <=0.01 uIU/mL. Performed at [facility], [place], [place] [address]   Heparin level (unfractionated)     Status: Abnormal   Collection Time: 03/02/20  6:24 AM  Result Value Ref Range   Heparin Unfractionated  0.20 (L) 0.30 - 0.70 IU/mL    Comment: (NOTE) If heparin results are below expected values, and patient dosage has  been confirmed, suggest follow up testing of antithrombin III levels. Performed at [facility], [address] [place], [place] [address]   Iron and TIBC     Status: Abnormal   Collection Time: 03/02/20  6:24 AM  Result Value Ref Range   Iron 13 (L) 28 - 170 ug/dL   TIBC 169 (L) 250 - 450 ug/dL   Saturation Ratios 8 (L) 10.4 - 31.8 %   UIBC 156 ug/dL    Comment: Performed at [facility], [address] [place], [place] [address]  T.pallidum Ab, Total     Status: None   Collection Time: 03/02/20  6:24 AM  Result Value Ref Range   T Pallidum Abs Non Reactive Non Reactive    Comment: (NOTE) Performed At: [facility] [place], [place] [id number] Rush Farmer MD [id number]   CULTURE, BLOOD (ROUTINE X 2) w Reflex to ID Panel     Status: None  Collection Time: 03/02/20  6:34 AM   Specimen: BLOOD  Result Value Ref Range   Specimen Description BLOOD RIGHT HAND    Special Requests      BOTTLES DRAWN AEROBIC AND ANAEROBIC Blood Culture adequate volume   Culture      NO GROWTH 5 DAYS Performed at [facility], [address] [place], [place] [address]    Report Status 03/07/2020 FINAL   Hemoglobin     Status: Abnormal   Collection Time: 03/02/20  1:01 PM  Result Value Ref Range   Hemoglobin 8.1 (L) 12.0 - 15.0 g/dL    Comment: Performed at [facility], [address] [place], [place] [address]  CBC     Status: Abnormal   Collection Time: 03/03/20  5:10 AM  Result Value Ref Range   WBC 7.3 4.0 - 10.5 K/uL   RBC 2.70 (L) 3.87 - 5.11 MIL/uL   Hemoglobin 8.1 (L) 12.0 - 15.0 g/dL   HCT 23.7 (L) 36 - 46 %   MCV 87.8 80.0 - 100.0 fL   MCH 30.0 26.0 - 34.0 pg   MCHC 34.2 30.0 - 36.0 g/dL   RDW 12.8 11.5 - 15.5 %   Platelets 316 150 - 400 K/uL   nRBC 0.0 0.0 - 0.2 %    Comment: Performed at  [facility], [address], [place], [place] [address]  Basic metabolic panel     Status: Abnormal   Collection Time: 03/03/20  5:10 AM  Result Value Ref Range   Sodium 139 135 - 145 mmol/L   Potassium 4.3 3.5 - 5.1 mmol/L   Chloride 110 98 - 111 mmol/L   CO2 21 (L) 22 - 32 mmol/L   Glucose, Bld 86 70 - 99 mg/dL    Comment: Glucose reference range applies only to samples taken after fasting for at least 8 hours.   BUN 5 (L) 8 - 23 mg/dL   Creatinine, Ser 0.73 0.44 - 1.00 mg/dL   Calcium 8.4 (L) 8.9 - 10.3 mg/dL   GFR calc non Af Amer >60 >60 mL/min   GFR calc Af Amer >60 >60 mL/min   Anion gap 8 5 - 15    Comment: Performed at [facility], [address] [place], [place] [address]  Magnesium     Status: None   Collection Time: 03/03/20  5:10 AM  Result Value Ref Range   Magnesium 2.1 1.7 - 2.4 mg/dL    Comment: Performed at [facility], [address] [place], [place] [address]  CBC with Differential/Platelet     Status: Abnormal   Collection Time: 03/04/20  6:08 AM  Result Value Ref Range   WBC 7.2 4.0 - 10.5 K/uL   RBC 2.77 (L) 3.87 - 5.11 MIL/uL   Hemoglobin 8.4 (L) 12.0 - 15.0 g/dL   HCT 24.7 (L) 36 - 46 %   MCV 89.2 80.0 - 100.0 fL   MCH 30.3 26.0 - 34.0 pg   MCHC 34.0 30.0 - 36.0 g/dL   RDW 12.8 11.5 - 15.5 %   Platelets 325 150 - 400 K/uL   nRBC 0.3 (H) 0.0 - 0.2 %   Neutrophils Relative % 68 %   Neutro Abs 4.9 1.7 - 7.7 K/uL   Lymphocytes Relative 20 %   Lymphs Abs 1.5 0.7 - 4.0 K/uL   Monocytes Relative 8 %   Monocytes Absolute 0.6 0.1 - 1.0 K/uL   Eosinophils Relative 3 %  Eosinophils Absolute 0.2 0.0 - 0.5 K/uL   Basophils Relative 0 %   Basophils Absolute 0.0 0.0 - 0.1 K/uL   Immature Granulocytes 1 %   Abs Immature Granulocytes 0.09 (H) 0.00 - 0.07 K/uL    Comment: Performed at [facility], [address] [place], [place] [address]  CBC with Differential/Platelet     Status: Abnormal   Collection Time:  03/05/20  6:28 AM  Result Value Ref Range   WBC 7.0 4.0 - 10.5 K/uL   RBC 2.82 (L) 3.87 - 5.11 MIL/uL   Hemoglobin 8.5 (L) 12.0 - 15.0 g/dL   HCT 25.9 (L) 36 - 46 %   MCV 91.8 80.0 - 100.0 fL   MCH 30.1 26.0 - 34.0 pg   MCHC 32.8 30.0 - 36.0 g/dL   RDW 12.7 11.5 - 15.5 %   Platelets 361 150 - 400 K/uL   nRBC 0.3 (H) 0.0 - 0.2 %   Neutrophils Relative % 67 %   Neutro Abs 4.7 1.7 - 7.7 K/uL   Lymphocytes Relative 20 %   Lymphs Abs 1.4 0.7 - 4.0 K/uL   Monocytes Relative 8 %   Monocytes Absolute 0.5 0.1 - 1.0 K/uL   Eosinophils Relative 3 %   Eosinophils Absolute 0.2 0.0 - 0.5 K/uL   Basophils Relative 0 %   Basophils Absolute 0.0 0.0 - 0.1 K/uL   Immature Granulocytes 2 %   Abs Immature Granulocytes 0.11 (H) 0.00 - 0.07 K/uL    Comment: Performed at [facility], [address], [place], [place] [address]    Radiology CT ABDOMEN PELVIS W CONTRAST  Addendum Date: 02/28/2020   ADDENDUM REPORT: 02/28/2020 23:05 ADDENDUM: Results were discussed with Dr. Ellender Hose at 10:56 p.m. [place] on February 28, 2020. Electronically Signed   By: Virgina Norfolk M.D.   On: 02/28/2020 23:05   Result Date: 02/28/2020 CLINICAL DATA:  Right lower quadrant pain. EXAM: CT ABDOMEN AND PELVIS WITH CONTRAST TECHNIQUE: Multidetector CT imaging of the abdomen and pelvis was performed using the standard protocol following bolus administration of intravenous contrast. CONTRAST:  72mL OMNIPAQUE IOHEXOL 300 MG/ML  SOLN COMPARISON:  None. FINDINGS: Lower chest: Marked severity intraluminal low attenuation is seen within the right pulmonary artery and several of its middle lobe and lower lobe branches. Mild to moderate severity multifocal infiltrates are seen within the right lower lobe. A 1.1 cm x 1.2 cm noncalcified lung nodule versus focal atelectasis is seen within the lateral aspect of the mid right lung. A very small right pleural effusion is seen. Hepatobiliary: No focal liver abnormality is seen. No  gallstones, gallbladder wall thickening, or biliary dilatation. Pancreas: Unremarkable. No pancreatic ductal dilatation or surrounding inflammatory changes. Spleen: Normal in size without focal abnormality. Adrenals/Urinary Tract: Adrenal glands are unremarkable. Kidneys are normal, without renal calculi, focal lesion, or hydronephrosis. Bladder is unremarkable. Stomach/Bowel: Stomach is within normal limits. Appendix appears normal. No evidence of bowel dilatation. Mildly thickened small bowel loops are seen within the posterior aspect of the lower abdomen. Noninflamed diverticula are seen throughout the sigmoid colon. Vascular/Lymphatic: There is mild calcification of the abdominal aorta and bilateral common iliac arteries, without evidence of aneurysmal dilatation. A moderate amount of intraluminal low attenuation is seen within the left common femoral vein (axial CT images 72 through 85, CT series number 2). No enlarged abdominal or pelvic lymph nodes. Reproductive: Status post hysterectomy. No adnexal masses. Other: No abdominal wall hernia or abnormality. No abdominopelvic ascites. Musculoskeletal: There  is approximately 2 mm anterolisthesis of the L4 vertebral body on L5, with mild multilevel degenerative changes noted throughout the remainder of the lumbar spine. IMPRESSION: 1. Marked severity pulmonary embolism involving the right pulmonary artery and several of its middle lobe and lower lobe branches. 2. Mild to moderate severity multifocal infiltrates within the right lower lobe, consistent with pneumonia. 3. 1.1 cm x 1.2 cm noncalcified lung nodule versus focal atelectasis within the lateral aspect of the mid right lung. Correlation with follow-up chest CT is recommended to determine stability, as an underlying neoplastic process cannot be excluded. 4. Moderate amount of DVT within the left common femoral vein. Further evaluation with left lower extremity venous Doppler ultrasound is recommended. 5.  Mildly thickened small bowel loops within the lower abdomen which may represent mild enteritis. 6. Sigmoid diverticulosis. Aortic Atherosclerosis (ICD10-I70.0). Electronically Signed: By: Virgina Norfolk M.D. On: 02/28/2020 22:52   PERIPHERAL VASCULAR CATHETERIZATION  Result Date: 02/29/2020 See op note  US Venous Img Lower Bilateral  Result Date: 02/29/2020 CLINICAL DATA:  Left femoral DVT on recent CT examination. EXAM: BILATERAL LOWER EXTREMITY VENOUS DOPPLER ULTRASOUND TECHNIQUE: Gray-scale sonography with graded compression, as well as color Doppler and duplex ultrasound were performed to evaluate the lower extremity deep venous systems from the level of the common femoral vein and including the common femoral, femoral, profunda femoral, popliteal and calf veins including the posterior tibial, peroneal and gastrocnemius veins when visible. The superficial great saphenous vein was also interrogated. Spectral Doppler was utilized to evaluate flow at rest and with distal augmentation maneuvers in the common femoral, femoral and popliteal veins. COMPARISON:  None. FINDINGS: RIGHT LOWER EXTREMITY Common Femoral Vein: No evidence of thrombus. Normal compressibility, respiratory phasicity and response to augmentation. Saphenofemoral Junction: No evidence of thrombus. Normal compressibility and flow on color Doppler imaging. Profunda Femoral Vein: No evidence of thrombus. Normal compressibility and flow on color Doppler imaging. Femoral Vein: No evidence of thrombus. Normal compressibility, respiratory phasicity and response to augmentation. Popliteal Vein: No evidence of thrombus. Normal compressibility, respiratory phasicity and response to augmentation. Calf Veins: No evidence of thrombus. Normal compressibility and flow on color Doppler imaging. Superficial Great Saphenous Vein: No evidence of thrombus. Normal compressibility. Venous Reflux:  None. Other Findings:  None. LEFT LOWER EXTREMITY Common  Femoral Vein: Thrombus is noted with decreased compressibility. Saphenofemoral Junction: Thrombus is noted with decreased compressibility. Profunda Femoral Vein: No evidence of thrombus. Normal compressibility and flow on color Doppler imaging. Femoral Vein: Thrombus is noted with decreased compressibility. Popliteal Vein: Thrombus is noted with decreased compressibility. Calf Veins: Thrombus is noted in the peroneal vein. The posterior tibial vein is within normal limits. Superficial Great Saphenous Vein: No evidence of thrombus. Normal compressibility. Venous Reflux:  None. Other Findings:  None. IMPRESSION: No evidence of deep venous thrombosis in the right lower extremity. Extensive deep venous thrombosis in the left lower extremity as described. Electronically Signed   By: Inez Catalina M.D.   On: 02/29/2020 00:56   ECHOCARDIOGRAM COMPLETE  Result Date: 03/02/2020    ECHOCARDIOGRAM REPORT   Patient Name:   Patricia Miller Date of Exam: 03/01/2020 Medical Rec #:  [id number]          Height:       60.0 in Accession #:    [id number]         Weight:       110.0 lb Date of Birth:  February 05, 1946          BSA:  1.448 m Patient Age:    32 years           BP:           114/71 mmHg Patient Gender: F                  HR:           110 bpm. Exam Location:  [facility] Procedure: 2D Echo, Cardiac Doppler and Color Doppler Indications:     Pulmonary embolus 415.19  History:         Patient has no prior history of Echocardiogram examinations.                  Risk Factors:Hypertension. Anxiety.  Sonographer:     Sherrie Sport RDCS (AE) Referring Phys:  [place] Diagnosing Phys: Yolonda Kida MD IMPRESSIONS  1. Left ventricular ejection fraction, by estimation, is 60 to 65%. The left ventricle has normal function. The left ventricle has no regional wall motion abnormalities. Left ventricular diastolic parameters are consistent with Grade I diastolic dysfunction (impaired relaxation).  2. Right ventricular systolic  function is normal. The right ventricular size is normal.  3. The mitral valve is normal in structure. Trivial mitral valve regurgitation.  4. The aortic valve is normal in structure. Aortic valve regurgitation is not visualized. FINDINGS  Left Ventricle: Left ventricular ejection fraction, by estimation, is 60 to 65%. The left ventricle has normal function. The left ventricle has no regional wall motion abnormalities. The left ventricular internal cavity size was normal in size. There is  no left ventricular hypertrophy. Left ventricular diastolic parameters are consistent with Grade I diastolic dysfunction (impaired relaxation). Right Ventricle: The right ventricular size is normal. No increase in right ventricular wall thickness. Right ventricular systolic function is normal. Left Atrium: Left atrial size was normal in size. Right Atrium: Right atrial size was normal in size. Pericardium: There is no evidence of pericardial effusion. Mitral Valve: The mitral valve is normal in structure. Trivial mitral valve regurgitation. Tricuspid Valve: The tricuspid valve is normal in structure. Tricuspid valve regurgitation is mild. Aortic Valve: The aortic valve is normal in structure. Aortic valve regurgitation is not visualized. Aortic valve mean gradient measures 4.5 mmHg. Aortic valve peak gradient measures 8.4 mmHg. Aortic valve area, by VTI measures 3.04 cm. Pulmonic Valve: The pulmonic valve was normal in structure. Pulmonic valve regurgitation is not visualized. Aorta: Aortic root could not be assessed. IAS/Shunts: No atrial level shunt detected by color flow Doppler.  LEFT VENTRICLE PLAX 2D LVIDd:         3.33 cm  Diastology LVIDs:         2.20 cm  LV e' medial:    6.96 cm/s LV PW:         1.23 cm  LV E/e' medial:  10.7 LV IVS:        1.08 cm  LV e' lateral:   10.80 cm/s LVOT diam:     2.00 cm  LV E/e' lateral: 6.9 LV SV:         69 LV SV Index:   48 LVOT Area:     3.14 cm  RIGHT VENTRICLE RV Basal diam:  3.08 cm  RV S prime:     17.00 cm/s TAPSE (M-mode): 3.4 cm LEFT ATRIUM           Index       RIGHT ATRIUM           Index  LA diam:      2.30 cm 1.59 cm/m  RA Area:     13.70 cm LA Vol (A2C): 26.3 ml 18.17 ml/m RA Volume:   34.20 ml  23.62 ml/m LA Vol (A4C): 33.8 ml 23.35 ml/m  AORTIC VALVE                   PULMONIC VALVE AV Area (Vmax):    2.69 cm    PV Vmax:        0.73 m/s AV Area (Vmean):   2.65 cm    PV Peak grad:   2.1 mmHg AV Area (VTI):     3.04 cm    RVOT Peak grad: 3 mmHg AV Vmax:           145.00 cm/s AV Vmean:          98.250 cm/s AV VTI:            0.227 m AV Peak Grad:      8.4 mmHg AV Mean Grad:      4.5 mmHg LVOT Vmax:         124.00 cm/s LVOT Vmean:        83.000 cm/s LVOT VTI:          0.220 m LVOT/AV VTI ratio: 0.97  AORTA Ao Root diam: 3.20 cm MITRAL VALVE                TRICUSPID VALVE MV Area (PHT): 4.33 cm     TR Peak grad:   42.8 mmHg MV Decel Time: 175 msec     TR Vmax:        327.00 cm/s MV E velocity: 74.60 cm/s MV A velocity: 107.00 cm/s  SHUNTS MV E/A ratio:  0.70         Systemic VTI:  0.22 m                             Systemic Diam: 2.00 cm Dwayne D Callwood MD Electronically signed by Yolonda Kida MD Signature Date/Time: 03/02/2020/5:57:20 PM    Final     Assessment/Plan  HTN (hypertension) blood pressure control important in reducing the progression of atherosclerotic disease. On appropriate oral medications.   Hyperlipidemia, mixed lipid control important in reducing the progression of atherosclerotic disease.    Acute deep vein thrombosis (DVT) of femoral vein of left lower extremity (HCC) Status post thrombectomy as well as IVC filter placement.  We will plan to continue anticoagulation for a minimum of 6 months.  We can discuss removal of her IVC filter at a follow-up visit in several months.  Her legs are doing well.  We are going to switch her from Eliquis to Xarelto at her request due to side effects from Eliquis.  Acute pulmonary embolism ([place]) Doing  well status post thrombectomy.  No real chest pain or severe shortness of breath.  Really just some shortness of breath with exertion.  Continue anticoagulation but we are going to switch from Eliquis to Xarelto at her request.    Leotis Pain, MD  03/22/2020 4:45 PM    This note was created with Dragon medical transcription system.  Any errors from dictation are purely unintentional

## 2020-03-22 NOTE — Assessment & Plan Note (Signed)
Doing well status post thrombectomy.  No real chest pain or severe shortness of breath.  Really just some shortness of breath with exertion.  Continue anticoagulation but we are going to switch from Eliquis to Xarelto at her request.

## 2020-03-22 NOTE — Assessment & Plan Note (Signed)
Status post thrombectomy as well as IVC filter placement.  We will plan to continue anticoagulation for a minimum of 6 months.  We can discuss removal of her IVC filter at a follow-up visit in several months.  Her legs are doing well.  We are going to switch her from Eliquis to Xarelto at her request due to side effects from Eliquis.

## 2020-03-29 ENCOUNTER — Ambulatory Visit (INDEPENDENT_AMBULATORY_CARE_PROVIDER_SITE_OTHER): Payer: Self-pay | Admitting: Vascular Surgery

## 2020-03-30 DIAGNOSIS — F0390 Unspecified dementia without behavioral disturbance: Secondary | ICD-10-CM | POA: Diagnosis not present

## 2020-03-30 DIAGNOSIS — I82412 Acute embolism and thrombosis of left femoral vein: Secondary | ICD-10-CM | POA: Diagnosis not present

## 2020-03-30 DIAGNOSIS — Z23 Encounter for immunization: Secondary | ICD-10-CM | POA: Diagnosis not present

## 2020-03-30 DIAGNOSIS — R911 Solitary pulmonary nodule: Secondary | ICD-10-CM | POA: Diagnosis not present

## 2020-03-30 DIAGNOSIS — I2602 Saddle embolus of pulmonary artery with acute cor pulmonale: Secondary | ICD-10-CM | POA: Diagnosis not present

## 2020-05-30 ENCOUNTER — Telehealth (INDEPENDENT_AMBULATORY_CARE_PROVIDER_SITE_OTHER): Payer: Self-pay

## 2020-05-31 NOTE — Telephone Encounter (Signed)
Left a message on the patient voicemail to return a call to the office

## 2020-05-31 NOTE — Telephone Encounter (Signed)
Patient was made aware with medical recommendations and will contact her PCP for follow up. The patient will contact our office if she plan to move forward with getting the CT

## 2020-06-10 ENCOUNTER — Other Ambulatory Visit (INDEPENDENT_AMBULATORY_CARE_PROVIDER_SITE_OTHER): Payer: Self-pay | Admitting: Nurse Practitioner

## 2020-06-13 ENCOUNTER — Other Ambulatory Visit (INDEPENDENT_AMBULATORY_CARE_PROVIDER_SITE_OTHER): Payer: Self-pay

## 2020-06-29 DIAGNOSIS — F0280 Dementia in other diseases classified elsewhere without behavioral disturbance: Secondary | ICD-10-CM | POA: Diagnosis not present

## 2020-06-29 DIAGNOSIS — I2609 Other pulmonary embolism with acute cor pulmonale: Secondary | ICD-10-CM | POA: Diagnosis not present

## 2020-06-29 DIAGNOSIS — G309 Alzheimer's disease, unspecified: Secondary | ICD-10-CM | POA: Insufficient documentation

## 2020-06-29 DIAGNOSIS — G301 Alzheimer's disease with late onset: Secondary | ICD-10-CM | POA: Diagnosis not present

## 2020-06-29 DIAGNOSIS — R911 Solitary pulmonary nodule: Secondary | ICD-10-CM | POA: Diagnosis not present

## 2020-06-30 ENCOUNTER — Other Ambulatory Visit: Payer: Self-pay | Admitting: Internal Medicine

## 2020-06-30 ENCOUNTER — Other Ambulatory Visit (HOSPITAL_COMMUNITY): Payer: Self-pay | Admitting: Internal Medicine

## 2020-06-30 DIAGNOSIS — R911 Solitary pulmonary nodule: Secondary | ICD-10-CM

## 2020-06-30 DIAGNOSIS — I2609 Other pulmonary embolism with acute cor pulmonale: Secondary | ICD-10-CM

## 2020-07-18 ENCOUNTER — Ambulatory Visit: Admission: RE | Admit: 2020-07-18 | Payer: PPO | Source: Ambulatory Visit

## 2020-07-22 ENCOUNTER — Other Ambulatory Visit: Payer: Self-pay

## 2020-07-22 ENCOUNTER — Ambulatory Visit
Admission: RE | Admit: 2020-07-22 | Discharge: 2020-07-22 | Disposition: A | Discharge status: Home / Self Care | Payer: PPO | Source: Ambulatory Visit | Attending: Internal Medicine | Admitting: Internal Medicine

## 2020-07-22 DIAGNOSIS — I2609 Other pulmonary embolism with acute cor pulmonale: Secondary | ICD-10-CM

## 2020-07-22 DIAGNOSIS — R911 Solitary pulmonary nodule: Secondary | ICD-10-CM | POA: Diagnosis not present

## 2020-07-22 DIAGNOSIS — J479 Bronchiectasis, uncomplicated: Secondary | ICD-10-CM | POA: Diagnosis not present

## 2020-07-22 DIAGNOSIS — I2510 Atherosclerotic heart disease of native coronary artery without angina pectoris: Secondary | ICD-10-CM | POA: Diagnosis not present

## 2020-07-22 DIAGNOSIS — J8410 Pulmonary fibrosis, unspecified: Secondary | ICD-10-CM | POA: Diagnosis not present

## 2020-07-22 DIAGNOSIS — M47812 Spondylosis without myelopathy or radiculopathy, cervical region: Secondary | ICD-10-CM | POA: Diagnosis not present

## 2020-07-22 MED ORDER — IOHEXOL 300 MG/ML  SOLN
75.0000 mL | Freq: Once | INTRAMUSCULAR | Status: AC | PRN
  Administered 2020-07-22: 75 mL via INTRAVENOUS

## 2020-07-26 ENCOUNTER — Other Ambulatory Visit: Payer: Self-pay | Admitting: Internal Medicine

## 2020-07-26 DIAGNOSIS — R911 Solitary pulmonary nodule: Secondary | ICD-10-CM

## 2020-07-26 DIAGNOSIS — J479 Bronchiectasis, uncomplicated: Secondary | ICD-10-CM | POA: Insufficient documentation

## 2020-08-09 ENCOUNTER — Ambulatory Visit: Payer: PPO

## 2020-08-12 ENCOUNTER — Other Ambulatory Visit: Payer: Self-pay

## 2020-08-12 ENCOUNTER — Ambulatory Visit
Admission: RE | Admit: 2020-08-12 | Discharge: 2020-08-12 | Disposition: A | Discharge status: Home / Self Care | Payer: PPO | Source: Ambulatory Visit | Attending: Internal Medicine | Admitting: Internal Medicine

## 2020-08-12 DIAGNOSIS — R911 Solitary pulmonary nodule: Secondary | ICD-10-CM | POA: Diagnosis not present

## 2020-08-12 LAB — POCT I-STAT CREATININE: Creatinine, Ser: 1.1 mg/dL — ABNORMAL HIGH (ref 0.44–1.00)

## 2020-08-12 MED ORDER — IOHEXOL 300 MG/ML  SOLN: 75.0000 mL | Freq: Once | INTRAMUSCULAR | Status: DC | PRN

## 2020-08-17 ENCOUNTER — Ambulatory Visit
Admission: RE | Admit: 2020-08-17 | Discharge: 2020-08-17 | Disposition: A | Discharge status: Home / Self Care | Payer: PPO | Source: Ambulatory Visit | Attending: Internal Medicine | Admitting: Internal Medicine

## 2020-08-17 ENCOUNTER — Other Ambulatory Visit: Payer: Self-pay

## 2020-08-17 ENCOUNTER — Other Ambulatory Visit: Payer: Self-pay | Admitting: Internal Medicine

## 2020-08-17 DIAGNOSIS — G9340 Encephalopathy, unspecified: Secondary | ICD-10-CM

## 2020-08-17 DIAGNOSIS — G459 Transient cerebral ischemic attack, unspecified: Secondary | ICD-10-CM | POA: Insufficient documentation

## 2020-08-17 DIAGNOSIS — R911 Solitary pulmonary nodule: Secondary | ICD-10-CM | POA: Diagnosis not present

## 2020-08-17 DIAGNOSIS — R519 Headache, unspecified: Secondary | ICD-10-CM | POA: Diagnosis not present

## 2020-08-17 DIAGNOSIS — G301 Alzheimer's disease with late onset: Secondary | ICD-10-CM | POA: Diagnosis not present

## 2020-08-17 DIAGNOSIS — R079 Chest pain, unspecified: Secondary | ICD-10-CM | POA: Diagnosis not present

## 2020-08-17 DIAGNOSIS — F0280 Dementia in other diseases classified elsewhere without behavioral disturbance: Secondary | ICD-10-CM | POA: Diagnosis not present

## 2020-08-17 DIAGNOSIS — R0781 Pleurodynia: Secondary | ICD-10-CM | POA: Diagnosis not present

## 2020-08-23 ENCOUNTER — Ambulatory Visit (INDEPENDENT_AMBULATORY_CARE_PROVIDER_SITE_OTHER): Payer: PPO | Admitting: Vascular Surgery

## 2020-09-06 DIAGNOSIS — H5213 Myopia, bilateral: Secondary | ICD-10-CM | POA: Diagnosis not present

## 2020-09-06 DIAGNOSIS — H524 Presbyopia: Secondary | ICD-10-CM | POA: Diagnosis not present

## 2020-09-06 DIAGNOSIS — H2513 Age-related nuclear cataract, bilateral: Secondary | ICD-10-CM | POA: Diagnosis not present

## 2020-09-09 ENCOUNTER — Encounter (INDEPENDENT_AMBULATORY_CARE_PROVIDER_SITE_OTHER): Payer: Self-pay

## 2020-09-09 ENCOUNTER — Ambulatory Visit (INDEPENDENT_AMBULATORY_CARE_PROVIDER_SITE_OTHER): Payer: PPO | Admitting: Vascular Surgery

## 2020-09-16 ENCOUNTER — Ambulatory Visit (INDEPENDENT_AMBULATORY_CARE_PROVIDER_SITE_OTHER): Payer: PPO | Admitting: Vascular Surgery

## 2020-09-27 ENCOUNTER — Ambulatory Visit (INDEPENDENT_AMBULATORY_CARE_PROVIDER_SITE_OTHER): Payer: PPO | Admitting: Vascular Surgery

## 2020-10-04 DIAGNOSIS — H2513 Age-related nuclear cataract, bilateral: Secondary | ICD-10-CM | POA: Diagnosis not present

## 2020-10-04 DIAGNOSIS — H524 Presbyopia: Secondary | ICD-10-CM | POA: Diagnosis not present

## 2020-10-04 DIAGNOSIS — H5213 Myopia, bilateral: Secondary | ICD-10-CM | POA: Diagnosis not present

## 2020-10-04 DIAGNOSIS — H01009 Unspecified blepharitis unspecified eye, unspecified eyelid: Secondary | ICD-10-CM | POA: Diagnosis not present

## 2020-10-11 ENCOUNTER — Encounter (INDEPENDENT_AMBULATORY_CARE_PROVIDER_SITE_OTHER): Payer: Self-pay | Admitting: Nurse Practitioner

## 2020-10-11 ENCOUNTER — Ambulatory Visit (INDEPENDENT_AMBULATORY_CARE_PROVIDER_SITE_OTHER): Payer: PPO | Admitting: Nurse Practitioner

## 2020-10-11 ENCOUNTER — Other Ambulatory Visit: Payer: Self-pay

## 2020-10-11 VITALS — BP 172/78 | HR 98 | Resp 16 | Wt 116.6 lb

## 2020-10-11 DIAGNOSIS — I82412 Acute embolism and thrombosis of left femoral vein: Secondary | ICD-10-CM

## 2020-10-11 DIAGNOSIS — E782 Mixed hyperlipidemia: Secondary | ICD-10-CM

## 2020-10-11 DIAGNOSIS — I10 Essential (primary) hypertension: Secondary | ICD-10-CM

## 2020-10-17 ENCOUNTER — Encounter (INDEPENDENT_AMBULATORY_CARE_PROVIDER_SITE_OTHER): Payer: Self-pay | Admitting: Nurse Practitioner

## 2020-10-17 NOTE — Progress Notes (Signed)
Subjective:    Patient ID: Patricia Miller, female    DOB: [birth year], 75 y.o.   MRN: [id number] Chief Complaint  Patient presents with  . Follow-up    5 month follow up    Patricia Miller is a 75 year old female that returns today for follow-up after having a lower extremity thrombectomy as well as a pulmonary thrombectomy and.  The patient had an IVC filter placed.  Patient notes that she has stopped her Xarelto.  Initially she was placed on Eliquis but noted that she felt poorly on this.  She denies any pain or swelling in her lower extremities.  She is doing well.  She denies any significant shortness of breath.  She denies any major issues at this time.     Review of Systems  Cardiovascular: Negative for leg swelling.  All other systems reviewed and are negative.      Objective:   Physical Exam Vitals reviewed.  HENT:     Head: Normocephalic.  Cardiovascular:     Rate and Rhythm: Normal rate.     Pulses: Normal pulses.  Pulmonary:     Effort: Pulmonary effort is normal.  Musculoskeletal:        General: Normal range of motion.  Skin:    General: Skin is warm and dry.  Neurological:     Mental Status: She is alert and oriented to person, place, and time.  Psychiatric:        Mood and Affect: Mood normal.        Behavior: Behavior normal.        Thought Content: Thought content normal.        Judgment: Judgment normal.     BP (!) 172/78 (BP Location: Right Arm)   Pulse 98   Resp 16   Wt 116 lb 9.6 oz (52.9 kg)   BMI 22.77 kg/m   Past Medical History:  Diagnosis Date  . Anxiety   . Arthritis    NECK  . Cancer ([place])    BASAL CELL AND MELANOMA  . DAVF (dural arteriovenous fistula) 2015  . Hypertension     Social History   Socioeconomic History  . Marital status: Divorced    Spouse name: Not on file  . Number of children: Not on file  . Years of education: Not on file  . Highest education level: Not on file  Occupational History  . Not on  file  Tobacco Use  . Smoking status: Former Smoker    Packs/day: 0.50    Years: 15.00    Pack years: 7.50    Types: Cigarettes    Quit date: 07/21/2000    Years since quitting: 20.2  . Smokeless tobacco: Never Used  Vaping Use  . Vaping Use: Never used  Substance and Sexual Activity  . Alcohol use: Yes    Comment: RARE WINE MONTHLY  . Drug use: No  . Sexual activity: Not Currently  Other Topics Concern  . Not on file  Social History Narrative  . Not on file   Social Determinants of Health   Financial Resource Strain: Not on file  Food Insecurity: Not on file  Transportation Needs: Not on file  Physical Activity: Not on file  Stress: Not on file  Social Connections: Not on file  Intimate Partner Violence: Not on file    Past Surgical History:  Procedure Laterality Date  . BRAIN SURGERY  2015   DURAL AV FISTULA ([place] [facility])  . COLONOSCOPY    .  COLONOSCOPY WITH PROPOFOL N/A 02/20/2017   Procedure: COLONOSCOPY WITH PROPOFOL;  Surgeon: Manya Silvas, MD;  Location: [facility]COPY;  Service: Endoscopy;  Laterality: N/A;  . HYSTEROSCOPY WITH D & C N/A 07/29/2015   Procedure: DILATATION AND CURETTAGE /HYSTEROSCOPY;  Surgeon: Benjaman Kindler, MD;  Location: [facility] ORS;  Service: Gynecology;  Laterality: N/A;  . HYSTEROSCOPY WITH D & C N/A 01/24/2018   Procedure: DILATATION AND CURETTAGE /HYSTEROSCOPY;  Surgeon: Benjaman Kindler, MD;  Location: [facility] ORS;  Service: Gynecology;  Laterality: N/A;  . LAPAROSCOPY N/A 01/24/2018   Procedure: LAPAROSCOPY DIAGNOSTIC;  Surgeon: Benjaman Kindler, MD;  Location: [facility] ORS;  Service: Gynecology;  Laterality: N/A;  . PERIPHERAL VASCULAR THROMBECTOMY N/A 02/29/2020   Procedure: PERIPHERAL VASCULAR THROMBECTOMY / THROMBOLYSIS WITH POSSIBLE PULMONARY THROMBECTOMY / THROMBOLYSIS;  Surgeon: Algernon Huxley, MD;  Location: [facility];  Service: Cardiovascular;  Laterality: N/A;  . TONSILLECTOMY     AGE 27  . TOTAL LAPAROSCOPIC HYSTERECTOMY  WITH BILATERAL SALPINGO OOPHORECTOMY Bilateral 12/28/2019   Procedure: TOTAL LAPAROSCOPIC HYSTERECTOMY WITH BILATERAL SALPINGO OOPHORECTOMY;  Surgeon: Benjaman Kindler, MD;  Location: [facility] ORS;  Service: Gynecology;  Laterality: Bilateral;  . TUBAL LIGATION      History reviewed. No pertinent family history.  No Known Allergies  CBC Latest Ref Rng & Units 03/05/2020 03/04/2020 03/03/2020  WBC 4.0 - 10.5 K/uL 7.0 7.2 7.3  Hemoglobin 12.0 - 15.0 g/dL 8.5(L) 8.4(L) 8.1(L)  Hematocrit 36.0 - 46.0 % 25.9(L) 24.7(L) 23.7(L)  Platelets 150 - 400 K/uL 361 325 316      CMP     Component Value Date/Time   NA 139 03/03/2020 0510   K 4.3 03/03/2020 0510   CL 110 03/03/2020 0510   CO2 21 (L) 03/03/2020 0510   GLUCOSE 86 03/03/2020 0510   BUN 5 (L) 03/03/2020 0510   CREATININE 1.10 (H) 08/12/2020 0927   CALCIUM 8.4 (L) 03/03/2020 0510   PROT 7.0 02/29/2020 0912   ALBUMIN 2.9 (L) 02/29/2020 0912   AST 13 (L) 02/29/2020 0912   ALT 8 02/29/2020 0912   ALKPHOS 63 02/29/2020 0912   BILITOT 0.9 02/29/2020 0912   GFRNONAA >60 03/03/2020 0510   GFRAA >60 03/03/2020 0510     No results found.     Assessment & Plan:   1. Acute deep vein thrombosis (DVT) of femoral vein of left lower extremity (HCC) Had a long discussion with the patient in regards to anticoagulation as well as IVC filter removal.  Prior to the office visit today the patient had already stopped her Xarelto.  We discussed the benefits as well as the risk of removing IVC filter versus leaving it in.  Based on discussion patient wishes to leave her IVC filter at this time.  We will talk signs and symptoms of possible filter occlusion.  Otherwise, we will have the patient return to the office on an as-needed basis. 2. Primary hypertension Continue antihypertensive medications as already ordered, these medications have been reviewed and there are no changes at this time.   3. Hyperlipidemia, mixed Continue statin as ordered and  reviewed, no changes at this time    Current Outpatient Medications on File Prior to Visit  Medication Sig Dispense Refill  . Calcium Carbonate-Vitamin D (OS-CAL 500 + D PO) Take 1 tablet by mouth daily.     . cetirizine (ZYRTEC) 10 MG tablet Take 10 mg by mouth daily as needed for allergies.     . Cholecalciferol (DIALYVITE VITAMIN D 5000) 125  MCG (5000 UT) capsule Take 5,000 Units by mouth daily.    Marland Kitchen estrogen, conjugated,-medroxyprogesterone (PREMPRO) 0.625-5 MG tablet Take 1 tablet by mouth every evening.     . Lutein 40 MG CAPS Take 40 mg by mouth daily.    . vitamin B-12 (CYANOCOBALAMIN) 1000 MCG tablet Take 1,000 mcg by mouth daily.     Marland Kitchen buPROPion (WELLBUTRIN XL) 150 MG 24 hr tablet Take 150 mg by mouth every morning.  (Patient not taking: Reported on 10/11/2020)  6  . docusate sodium (COLACE) 100 MG capsule Take 1 capsule (100 mg total) by mouth 2 (two) times daily. To keep stools soft (Patient not taking: No sig reported) 30 capsule 0  . oxyCODONE (OXY IR/ROXICODONE) 5 MG immediate release tablet Take 1 tablet (5 mg total) by mouth every 4 (four) hours as needed for severe pain. (Patient not taking: No sig reported) 20 tablet 0  . rivaroxaban (XARELTO) 20 MG TABS tablet 1 tablet (20 mg total) daily with breakfast (Patient not taking: Reported on 10/11/2020)    . XARELTO 20 MG TABS tablet TAKE 1 TABLET BY MOUTH EVERY DAY (Patient not taking: Reported on 10/11/2020) 90 tablet 1   No current facility-administered medications on file prior to visit.    There are no Patient Instructions on file for this visit. No follow-ups on file.   Kris Hartmann, NP

## 2020-10-20 ENCOUNTER — Ambulatory Visit
Admission: RE | Admit: 2020-10-20 | Discharge: 2020-10-20 | Disposition: A | Discharge status: Home / Self Care | Payer: PPO | Source: Ambulatory Visit | Attending: Internal Medicine | Admitting: Internal Medicine

## 2020-10-20 ENCOUNTER — Other Ambulatory Visit: Payer: Self-pay

## 2020-10-20 DIAGNOSIS — R911 Solitary pulmonary nodule: Secondary | ICD-10-CM | POA: Insufficient documentation

## 2020-10-20 DIAGNOSIS — J8410 Pulmonary fibrosis, unspecified: Secondary | ICD-10-CM | POA: Diagnosis not present

## 2020-10-20 DIAGNOSIS — J948 Other specified pleural conditions: Secondary | ICD-10-CM | POA: Diagnosis not present

## 2020-10-20 DIAGNOSIS — J984 Other disorders of lung: Secondary | ICD-10-CM | POA: Diagnosis not present

## 2020-10-20 DIAGNOSIS — J479 Bronchiectasis, uncomplicated: Secondary | ICD-10-CM | POA: Diagnosis not present

## 2020-10-20 MED ORDER — IOHEXOL 300 MG/ML  SOLN
75.0000 mL | Freq: Once | INTRAMUSCULAR | Status: AC | PRN
  Administered 2020-10-20: 75 mL via INTRAVENOUS

## 2020-10-21 LAB — POCT I-STAT CREATININE: Creatinine, Ser: 0.9 mg/dL (ref 0.44–1.00)

## 2020-11-01 DIAGNOSIS — H524 Presbyopia: Secondary | ICD-10-CM | POA: Diagnosis not present

## 2020-11-01 DIAGNOSIS — H01009 Unspecified blepharitis unspecified eye, unspecified eyelid: Secondary | ICD-10-CM | POA: Diagnosis not present

## 2020-11-01 DIAGNOSIS — H5213 Myopia, bilateral: Secondary | ICD-10-CM | POA: Diagnosis not present

## 2020-11-01 DIAGNOSIS — H2513 Age-related nuclear cataract, bilateral: Secondary | ICD-10-CM | POA: Diagnosis not present

## 2020-11-16 DIAGNOSIS — R9431 Abnormal electrocardiogram [ECG] [EKG]: Secondary | ICD-10-CM | POA: Diagnosis not present

## 2020-11-16 DIAGNOSIS — J479 Bronchiectasis, uncomplicated: Secondary | ICD-10-CM | POA: Diagnosis not present

## 2020-11-29 DIAGNOSIS — L237 Allergic contact dermatitis due to plants, except food: Secondary | ICD-10-CM | POA: Diagnosis not present

## 2020-12-05 DIAGNOSIS — I493 Ventricular premature depolarization: Secondary | ICD-10-CM | POA: Insufficient documentation

## 2020-12-05 DIAGNOSIS — I82412 Acute embolism and thrombosis of left femoral vein: Secondary | ICD-10-CM | POA: Diagnosis not present

## 2020-12-05 DIAGNOSIS — I2609 Other pulmonary embolism with acute cor pulmonale: Secondary | ICD-10-CM | POA: Diagnosis not present

## 2020-12-05 DIAGNOSIS — J479 Bronchiectasis, uncomplicated: Secondary | ICD-10-CM | POA: Diagnosis not present

## 2020-12-05 DIAGNOSIS — I770 Arteriovenous fistula, acquired: Secondary | ICD-10-CM | POA: Diagnosis not present

## 2020-12-05 DIAGNOSIS — R002 Palpitations: Secondary | ICD-10-CM | POA: Diagnosis not present

## 2020-12-05 DIAGNOSIS — R911 Solitary pulmonary nodule: Secondary | ICD-10-CM | POA: Diagnosis not present

## 2020-12-05 DIAGNOSIS — G301 Alzheimer's disease with late onset: Secondary | ICD-10-CM | POA: Diagnosis not present

## 2020-12-05 DIAGNOSIS — E782 Mixed hyperlipidemia: Secondary | ICD-10-CM | POA: Diagnosis not present

## 2020-12-05 DIAGNOSIS — F0280 Dementia in other diseases classified elsewhere without behavioral disturbance: Secondary | ICD-10-CM | POA: Diagnosis not present

## 2021-01-04 DIAGNOSIS — R002 Palpitations: Secondary | ICD-10-CM | POA: Diagnosis not present

## 2021-01-05 DIAGNOSIS — I2609 Other pulmonary embolism with acute cor pulmonale: Secondary | ICD-10-CM | POA: Diagnosis not present

## 2021-01-05 DIAGNOSIS — I82412 Acute embolism and thrombosis of left femoral vein: Secondary | ICD-10-CM | POA: Diagnosis not present

## 2021-01-05 DIAGNOSIS — F0280 Dementia in other diseases classified elsewhere without behavioral disturbance: Secondary | ICD-10-CM | POA: Diagnosis not present

## 2021-01-05 DIAGNOSIS — I493 Ventricular premature depolarization: Secondary | ICD-10-CM | POA: Diagnosis not present

## 2021-01-05 DIAGNOSIS — J479 Bronchiectasis, uncomplicated: Secondary | ICD-10-CM | POA: Diagnosis not present

## 2021-01-05 DIAGNOSIS — E782 Mixed hyperlipidemia: Secondary | ICD-10-CM | POA: Diagnosis not present

## 2021-01-05 DIAGNOSIS — G301 Alzheimer's disease with late onset: Secondary | ICD-10-CM | POA: Diagnosis not present

## 2021-05-08 DIAGNOSIS — I493 Ventricular premature depolarization: Secondary | ICD-10-CM | POA: Diagnosis not present

## 2021-08-03 DIAGNOSIS — L309 Dermatitis, unspecified: Secondary | ICD-10-CM | POA: Diagnosis not present

## 2021-08-14 DIAGNOSIS — H5213 Myopia, bilateral: Secondary | ICD-10-CM | POA: Diagnosis not present

## 2021-08-14 DIAGNOSIS — H01009 Unspecified blepharitis unspecified eye, unspecified eyelid: Secondary | ICD-10-CM | POA: Diagnosis not present

## 2021-08-14 DIAGNOSIS — H524 Presbyopia: Secondary | ICD-10-CM | POA: Diagnosis not present

## 2021-08-14 DIAGNOSIS — H2513 Age-related nuclear cataract, bilateral: Secondary | ICD-10-CM | POA: Diagnosis not present

## 2021-09-28 IMAGING — US US EXTREM LOW VENOUS
1 series · 13 of 24 positions shown · non-contrast
Comparison: None.

CLINICAL DATA: Left femoral DVT on recent CT examination.



[Series 1: us venous img lower bilat (dvt) · portal-venous · 13 of 53 slices shown]
[im 1/53]
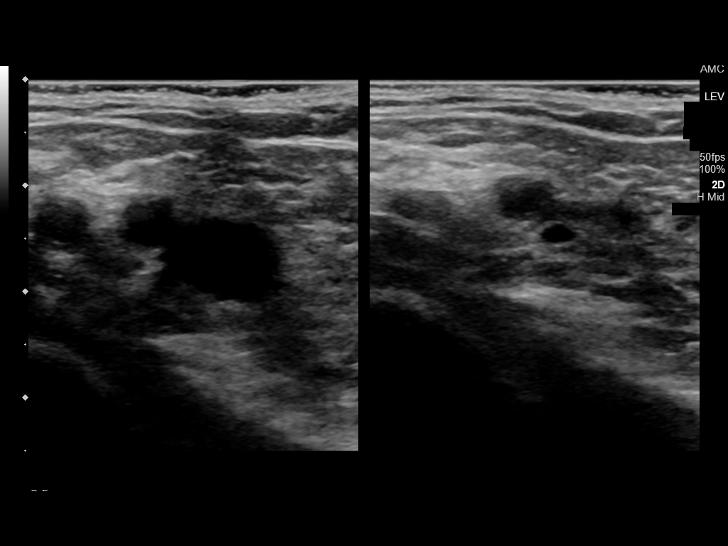
[im 5/53]
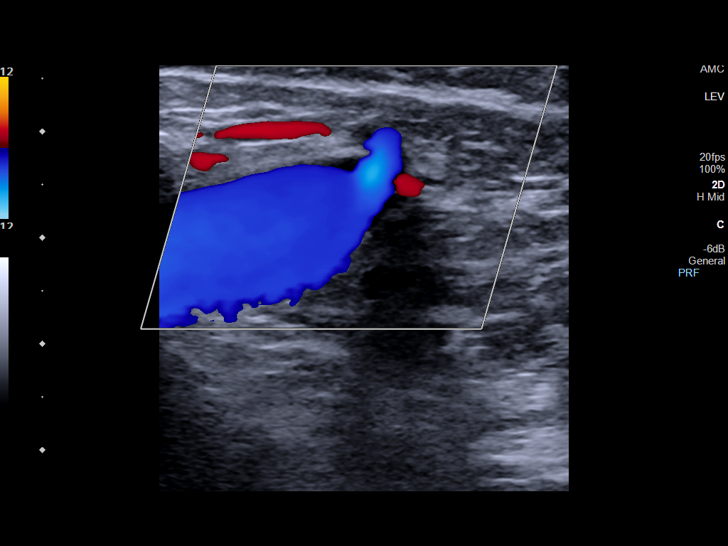
[im 10/53]
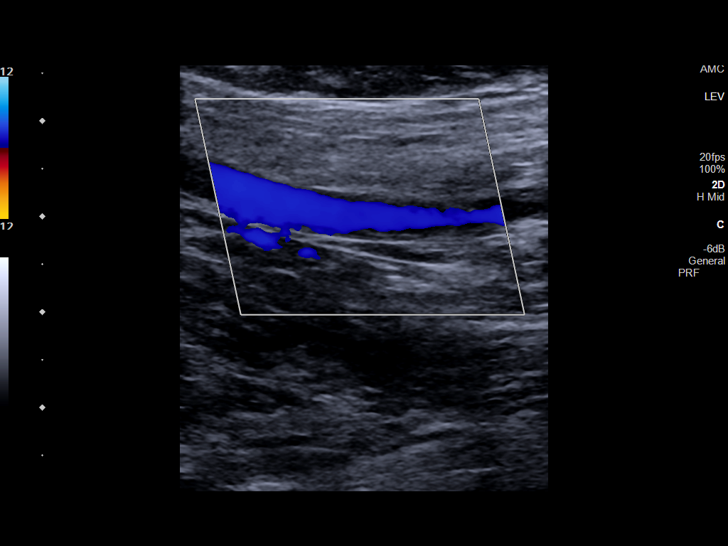
[im 14/53]
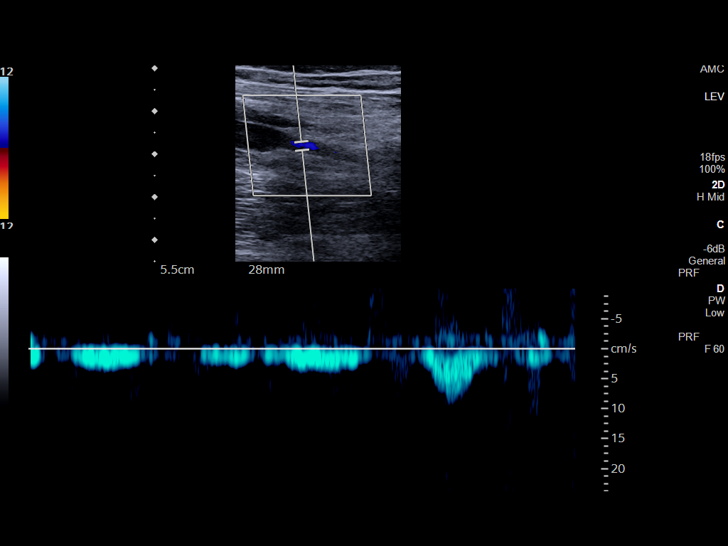
[im 19/53]
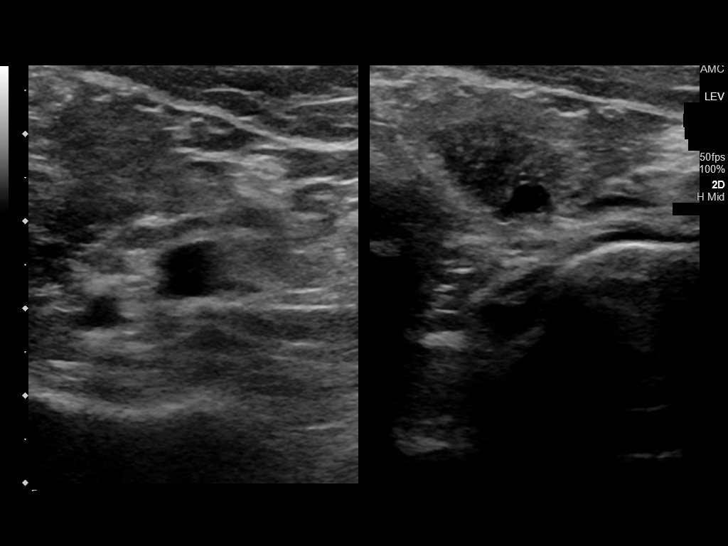
[im 23/53]
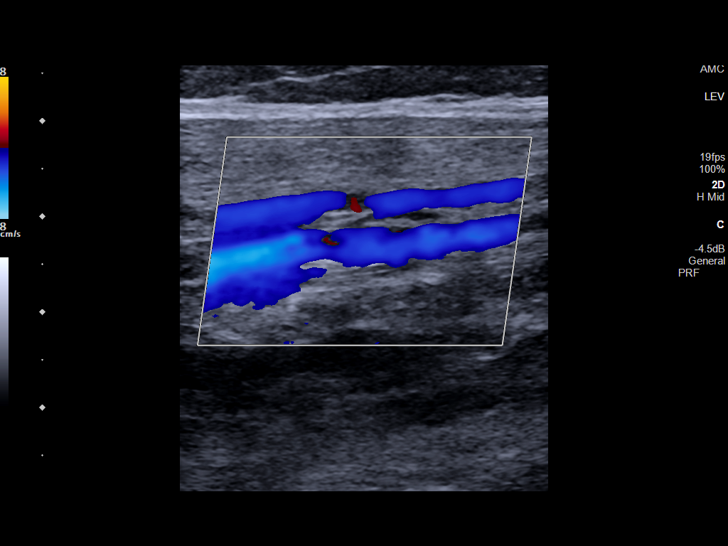
[im 28/53]
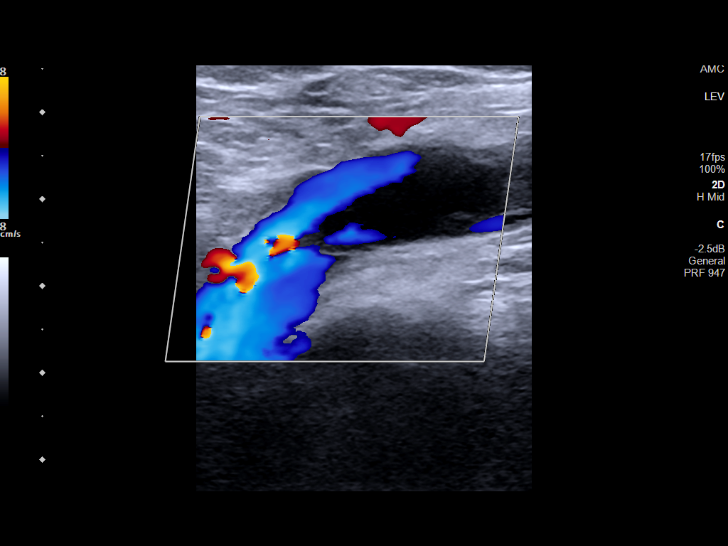
[im 30/53]
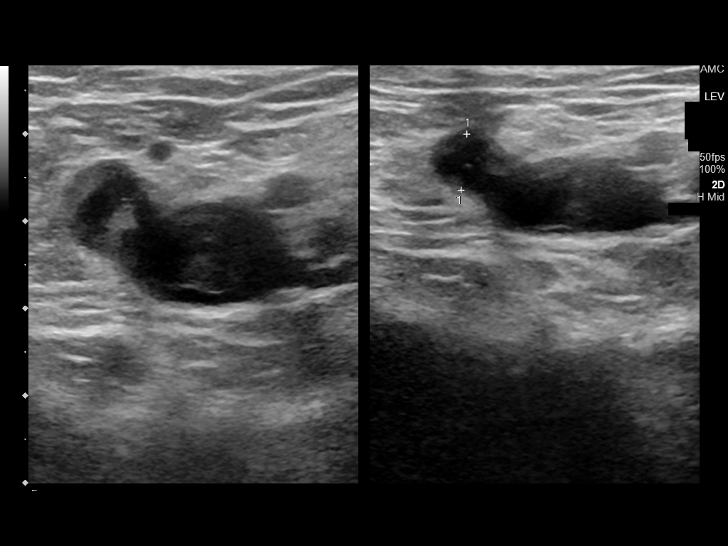
[im 34/53]
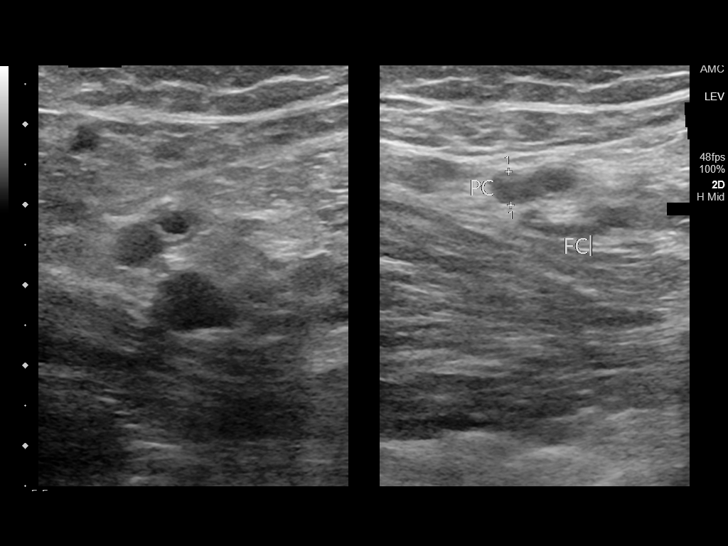
[im 39/53]
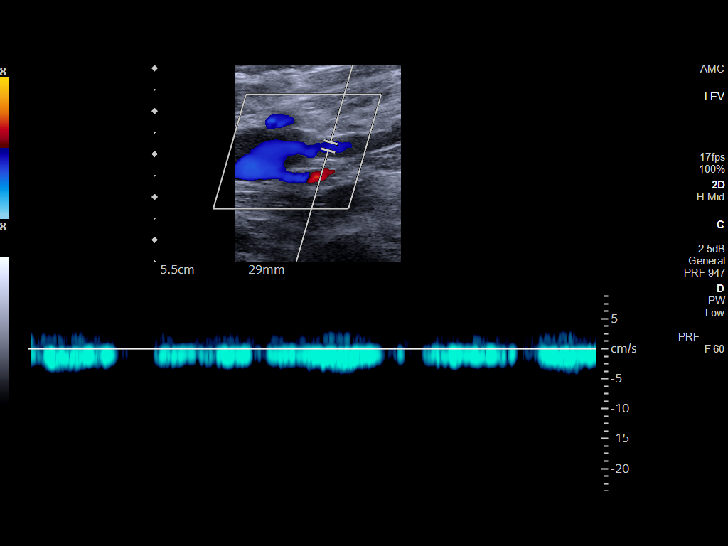
[im 43/53]
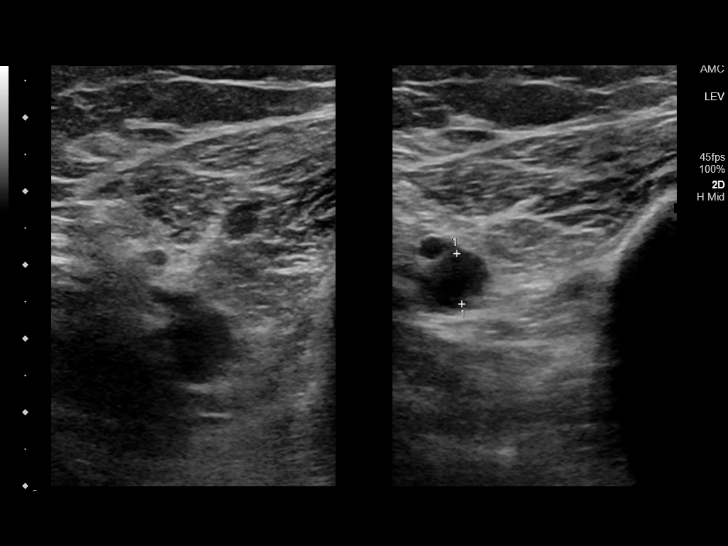
[im 48/53]
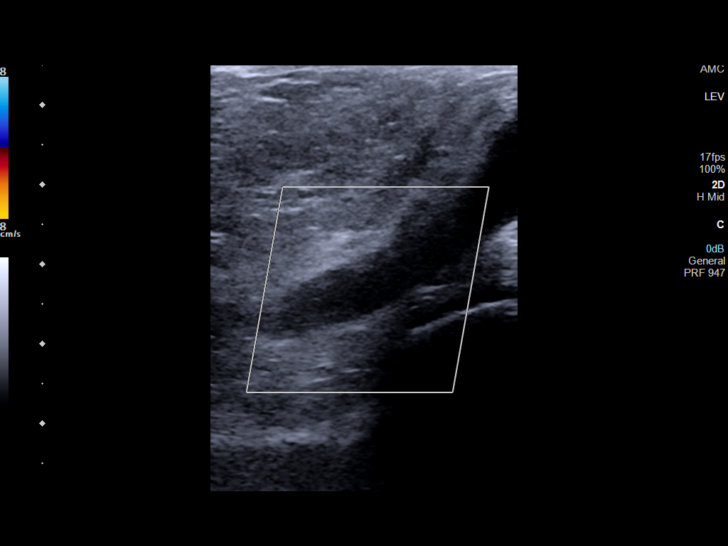
[im 53/53]
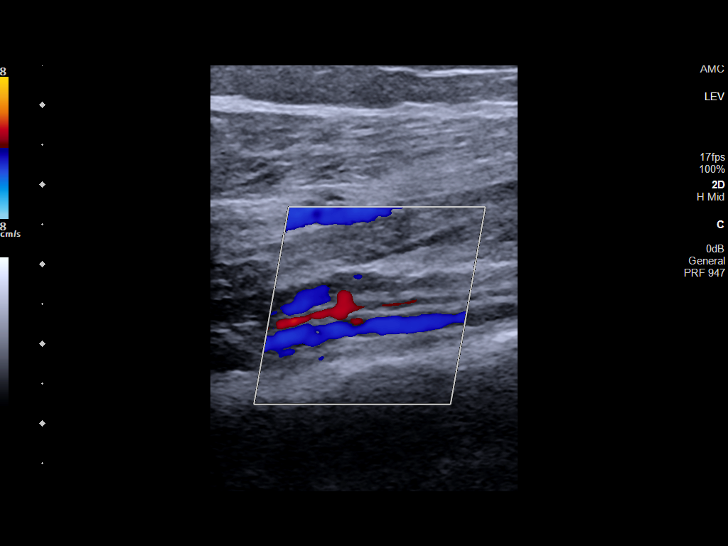

[13 of 24 positions shown; findings below may reference images not displayed]

FINDINGS: RIGHT LOWER EXTREMITY

Common Femoral Vein: No evidence of thrombus. Normal
compressibility, respiratory phasicity and response to augmentation.

Saphenofemoral Junction: No evidence of thrombus. Normal
compressibility and flow on color Doppler imaging.

Profunda Femoral Vein: No evidence of thrombus. Normal
compressibility and flow on color Doppler imaging.

Femoral Vein: No evidence of thrombus. Normal compressibility,
respiratory phasicity and response to augmentation.

Popliteal Vein: No evidence of thrombus. Normal compressibility,
respiratory phasicity and response to augmentation.

Calf Veins: No evidence of thrombus. Normal compressibility and flow
on color Doppler imaging.

Superficial Great Saphenous Vein: No evidence of thrombus. Normal
compressibility.

Venous Reflux:  None.

Other Findings:  None.

LEFT LOWER EXTREMITY

Common Femoral Vein: Thrombus is noted with decreased
compressibility.

Saphenofemoral Junction: Thrombus is noted with decreased
compressibility.

Profunda Femoral Vein: No evidence of thrombus. Normal
compressibility and flow on color Doppler imaging.

Femoral Vein: Thrombus is noted with decreased compressibility.

Popliteal Vein: Thrombus is noted with decreased compressibility.

Calf Veins: Thrombus is noted in the peroneal vein. The posterior
tibial vein is within normal limits.

Superficial Great Saphenous Vein: No evidence of thrombus. Normal
compressibility.

Venous Reflux:  None.

Other Findings:  None.
IMPRESSION: No evidence of deep venous thrombosis in the right lower extremity.

Extensive deep venous thrombosis in the left lower extremity as
described.

## 2021-10-31 DIAGNOSIS — J479 Bronchiectasis, uncomplicated: Secondary | ICD-10-CM | POA: Diagnosis not present

## 2021-10-31 DIAGNOSIS — I82412 Acute embolism and thrombosis of left femoral vein: Secondary | ICD-10-CM | POA: Diagnosis not present

## 2021-10-31 DIAGNOSIS — I493 Ventricular premature depolarization: Secondary | ICD-10-CM | POA: Diagnosis not present

## 2021-10-31 DIAGNOSIS — I2609 Other pulmonary embolism with acute cor pulmonale: Secondary | ICD-10-CM | POA: Diagnosis not present

## 2021-10-31 DIAGNOSIS — E782 Mixed hyperlipidemia: Secondary | ICD-10-CM | POA: Diagnosis not present

## 2021-11-01 DIAGNOSIS — D485 Neoplasm of uncertain behavior of skin: Secondary | ICD-10-CM | POA: Diagnosis not present

## 2021-11-01 DIAGNOSIS — L570 Actinic keratosis: Secondary | ICD-10-CM | POA: Diagnosis not present

## 2021-11-01 DIAGNOSIS — D2271 Melanocytic nevi of right lower limb, including hip: Secondary | ICD-10-CM | POA: Diagnosis not present

## 2021-11-01 DIAGNOSIS — D2262 Melanocytic nevi of left upper limb, including shoulder: Secondary | ICD-10-CM | POA: Diagnosis not present

## 2021-11-01 DIAGNOSIS — C44519 Basal cell carcinoma of skin of other part of trunk: Secondary | ICD-10-CM | POA: Diagnosis not present

## 2021-11-01 DIAGNOSIS — C44729 Squamous cell carcinoma of skin of left lower limb, including hip: Secondary | ICD-10-CM | POA: Diagnosis not present

## 2021-11-01 DIAGNOSIS — D225 Melanocytic nevi of trunk: Secondary | ICD-10-CM | POA: Diagnosis not present

## 2021-11-01 DIAGNOSIS — D2272 Melanocytic nevi of left lower limb, including hip: Secondary | ICD-10-CM | POA: Diagnosis not present

## 2021-11-01 DIAGNOSIS — D2261 Melanocytic nevi of right upper limb, including shoulder: Secondary | ICD-10-CM | POA: Diagnosis not present

## 2021-11-01 DIAGNOSIS — X32XXXA Exposure to sunlight, initial encounter: Secondary | ICD-10-CM | POA: Diagnosis not present

## 2021-11-01 DIAGNOSIS — L821 Other seborrheic keratosis: Secondary | ICD-10-CM | POA: Diagnosis not present

## 2021-11-09 ENCOUNTER — Other Ambulatory Visit: Payer: Self-pay | Admitting: Family Medicine

## 2021-11-09 DIAGNOSIS — N6321 Unspecified lump in the left breast, upper outer quadrant: Secondary | ICD-10-CM | POA: Diagnosis not present

## 2021-11-09 DIAGNOSIS — Z1231 Encounter for screening mammogram for malignant neoplasm of breast: Secondary | ICD-10-CM

## 2021-11-09 DIAGNOSIS — R001 Bradycardia, unspecified: Secondary | ICD-10-CM | POA: Diagnosis not present

## 2021-11-10 ENCOUNTER — Other Ambulatory Visit: Payer: Self-pay | Admitting: Family Medicine

## 2021-11-10 DIAGNOSIS — N6321 Unspecified lump in the left breast, upper outer quadrant: Secondary | ICD-10-CM

## 2021-11-10 DIAGNOSIS — Z1231 Encounter for screening mammogram for malignant neoplasm of breast: Secondary | ICD-10-CM

## 2021-11-15 DIAGNOSIS — G309 Alzheimer's disease, unspecified: Secondary | ICD-10-CM | POA: Diagnosis not present

## 2021-11-15 DIAGNOSIS — E782 Mixed hyperlipidemia: Secondary | ICD-10-CM | POA: Diagnosis not present

## 2021-11-15 DIAGNOSIS — J479 Bronchiectasis, uncomplicated: Secondary | ICD-10-CM | POA: Diagnosis not present

## 2021-11-15 DIAGNOSIS — F0280 Dementia in other diseases classified elsewhere without behavioral disturbance: Secondary | ICD-10-CM | POA: Diagnosis not present

## 2021-11-15 DIAGNOSIS — Z0000 Encounter for general adult medical examination without abnormal findings: Secondary | ICD-10-CM | POA: Diagnosis not present

## 2021-11-15 DIAGNOSIS — N6321 Unspecified lump in the left breast, upper outer quadrant: Secondary | ICD-10-CM | POA: Diagnosis not present

## 2021-11-27 ENCOUNTER — Other Ambulatory Visit: Payer: Self-pay | Admitting: Internal Medicine

## 2021-11-27 ENCOUNTER — Other Ambulatory Visit: Payer: Self-pay | Admitting: Family Medicine

## 2021-11-27 ENCOUNTER — Ambulatory Visit
Admission: RE | Admit: 2021-11-27 | Discharge: 2021-11-27 | Disposition: A | Discharge status: Home / Self Care | Payer: PPO | Source: Ambulatory Visit | Attending: Family Medicine | Admitting: Family Medicine

## 2021-11-27 ENCOUNTER — Ambulatory Visit
Admission: RE | Admit: 2021-11-27 | Discharge: 2021-11-27 | Disposition: A | Discharge status: Home / Self Care | Payer: PPO | Source: Ambulatory Visit | Attending: Internal Medicine | Admitting: Internal Medicine

## 2021-11-27 DIAGNOSIS — Z1231 Encounter for screening mammogram for malignant neoplasm of breast: Secondary | ICD-10-CM | POA: Diagnosis not present

## 2021-11-27 DIAGNOSIS — N6321 Unspecified lump in the left breast, upper outer quadrant: Secondary | ICD-10-CM | POA: Diagnosis not present

## 2021-11-27 DIAGNOSIS — N6310 Unspecified lump in the right breast, unspecified quadrant: Secondary | ICD-10-CM | POA: Diagnosis not present

## 2021-11-27 DIAGNOSIS — R920 Mammographic microcalcification found on diagnostic imaging of breast: Secondary | ICD-10-CM | POA: Diagnosis not present

## 2021-11-29 ENCOUNTER — Other Ambulatory Visit: Payer: Self-pay | Admitting: Family Medicine

## 2021-11-29 DIAGNOSIS — R928 Other abnormal and inconclusive findings on diagnostic imaging of breast: Secondary | ICD-10-CM

## 2021-11-29 DIAGNOSIS — N63 Unspecified lump in unspecified breast: Secondary | ICD-10-CM

## 2021-11-29 DIAGNOSIS — R921 Mammographic calcification found on diagnostic imaging of breast: Secondary | ICD-10-CM

## 2021-12-14 ENCOUNTER — Ambulatory Visit
Admission: RE | Admit: 2021-12-14 | Discharge: 2021-12-14 | Disposition: A | Discharge status: Home / Self Care | Payer: PPO | Source: Ambulatory Visit | Attending: Family Medicine | Admitting: Family Medicine

## 2021-12-14 DIAGNOSIS — C773 Secondary and unspecified malignant neoplasm of axilla and upper limb lymph nodes: Secondary | ICD-10-CM | POA: Insufficient documentation

## 2021-12-14 DIAGNOSIS — N63 Unspecified lump in unspecified breast: Secondary | ICD-10-CM

## 2021-12-14 DIAGNOSIS — D242 Benign neoplasm of left breast: Secondary | ICD-10-CM | POA: Insufficient documentation

## 2021-12-14 DIAGNOSIS — R928 Other abnormal and inconclusive findings on diagnostic imaging of breast: Secondary | ICD-10-CM | POA: Insufficient documentation

## 2021-12-14 DIAGNOSIS — N6322 Unspecified lump in the left breast, upper inner quadrant: Secondary | ICD-10-CM | POA: Diagnosis not present

## 2021-12-14 DIAGNOSIS — C50112 Malignant neoplasm of central portion of left female breast: Secondary | ICD-10-CM | POA: Insufficient documentation

## 2021-12-14 DIAGNOSIS — C50412 Malignant neoplasm of upper-outer quadrant of left female breast: Secondary | ICD-10-CM | POA: Diagnosis not present

## 2021-12-14 DIAGNOSIS — C50812 Malignant neoplasm of overlapping sites of left female breast: Secondary | ICD-10-CM | POA: Diagnosis not present

## 2021-12-14 HISTORY — PX: OTHER SURGICAL HISTORY: SHX169

## 2021-12-14 HISTORY — PX: BREAST BIOPSY: SHX20

## 2021-12-18 ENCOUNTER — Encounter: Payer: Self-pay | Admitting: *Deleted

## 2021-12-18 DIAGNOSIS — C50919 Malignant neoplasm of unspecified site of unspecified female breast: Secondary | ICD-10-CM

## 2021-12-18 DIAGNOSIS — I872 Venous insufficiency (chronic) (peripheral): Secondary | ICD-10-CM | POA: Diagnosis not present

## 2021-12-18 DIAGNOSIS — C44729 Squamous cell carcinoma of skin of left lower limb, including hip: Secondary | ICD-10-CM | POA: Diagnosis not present

## 2021-12-18 LAB — SURGICAL PATHOLOGY

## 2021-12-18 NOTE — Progress Notes (Signed)
Received referral from [facility] radiology for new diagnosis of left breast cancer.   She also has a biopsy for her right breast scheduled for tomorrow.   Navigation initiated.   Appointments scheduled for Dr. Peyton Najjar on 12/20/21 and Dr. Janese Banks on 12/22/21. Appointments given to POA Niece Maximiano Coss.

## 2021-12-19 ENCOUNTER — Ambulatory Visit
Admission: RE | Admit: 2021-12-19 | Discharge: 2021-12-19 | Disposition: A | Discharge status: Home / Self Care | Payer: PPO | Source: Ambulatory Visit | Attending: Family Medicine | Admitting: Family Medicine

## 2021-12-19 DIAGNOSIS — R928 Other abnormal and inconclusive findings on diagnostic imaging of breast: Secondary | ICD-10-CM | POA: Insufficient documentation

## 2021-12-19 DIAGNOSIS — N63 Unspecified lump in unspecified breast: Secondary | ICD-10-CM | POA: Insufficient documentation

## 2021-12-19 DIAGNOSIS — R921 Mammographic calcification found on diagnostic imaging of breast: Secondary | ICD-10-CM | POA: Diagnosis not present

## 2021-12-19 DIAGNOSIS — N6314 Unspecified lump in the right breast, lower inner quadrant: Secondary | ICD-10-CM | POA: Diagnosis not present

## 2021-12-19 DIAGNOSIS — N6091 Unspecified benign mammary dysplasia of right breast: Secondary | ICD-10-CM | POA: Diagnosis not present

## 2021-12-19 HISTORY — PX: BREAST BIOPSY: SHX20

## 2021-12-20 DIAGNOSIS — C50912 Malignant neoplasm of unspecified site of left female breast: Secondary | ICD-10-CM | POA: Diagnosis not present

## 2021-12-20 DIAGNOSIS — C773 Secondary and unspecified malignant neoplasm of axilla and upper limb lymph nodes: Secondary | ICD-10-CM | POA: Diagnosis not present

## 2021-12-20 LAB — SURGICAL PATHOLOGY

## 2021-12-22 ENCOUNTER — Encounter: Payer: Self-pay | Admitting: Oncology

## 2021-12-22 ENCOUNTER — Encounter: Payer: Self-pay | Admitting: *Deleted

## 2021-12-22 ENCOUNTER — Inpatient Hospital Stay: Payer: PPO | Attending: Oncology | Admitting: Oncology

## 2021-12-22 ENCOUNTER — Inpatient Hospital Stay: Payer: PPO

## 2021-12-22 VITALS — BP 154/84 | HR 65 | Resp 14 | Ht 59.0 in | Wt 104.8 lb

## 2021-12-22 DIAGNOSIS — Z7189 Other specified counseling: Secondary | ICD-10-CM | POA: Diagnosis not present

## 2021-12-22 DIAGNOSIS — Z171 Estrogen receptor negative status [ER-]: Secondary | ICD-10-CM | POA: Diagnosis not present

## 2021-12-22 DIAGNOSIS — C50912 Malignant neoplasm of unspecified site of left female breast: Secondary | ICD-10-CM | POA: Diagnosis not present

## 2021-12-22 DIAGNOSIS — C50919 Malignant neoplasm of unspecified site of unspecified female breast: Secondary | ICD-10-CM

## 2021-12-22 DIAGNOSIS — Z800 Family history of malignant neoplasm of digestive organs: Secondary | ICD-10-CM | POA: Diagnosis not present

## 2021-12-22 DIAGNOSIS — R918 Other nonspecific abnormal finding of lung field: Secondary | ICD-10-CM | POA: Diagnosis not present

## 2021-12-22 DIAGNOSIS — C50412 Malignant neoplasm of upper-outer quadrant of left female breast: Secondary | ICD-10-CM | POA: Diagnosis not present

## 2021-12-22 DIAGNOSIS — Z87891 Personal history of nicotine dependence: Secondary | ICD-10-CM | POA: Insufficient documentation

## 2021-12-22 DIAGNOSIS — C801 Malignant (primary) neoplasm, unspecified: Secondary | ICD-10-CM

## 2021-12-22 LAB — CBC WITH DIFFERENTIAL/PLATELET
Abs Immature Granulocytes: 0.02 10*3/uL (ref 0.00–0.07)
Basophils Absolute: 0 10*3/uL (ref 0.0–0.1)
Basophils Relative: 1 %
Eosinophils Absolute: 0.1 10*3/uL (ref 0.0–0.5)
Eosinophils Relative: 3 %
HCT: 49 % — ABNORMAL HIGH (ref 36.0–46.0)
Hemoglobin: 16.1 g/dL — ABNORMAL HIGH (ref 12.0–15.0)
Immature Granulocytes: 0 %
Lymphocytes Relative: 24 %
Lymphs Abs: 1.4 10*3/uL (ref 0.7–4.0)
MCH: 31.1 pg (ref 26.0–34.0)
MCHC: 32.9 g/dL (ref 30.0–36.0)
MCV: 94.6 fL (ref 80.0–100.0)
Monocytes Absolute: 0.4 10*3/uL (ref 0.1–1.0)
Monocytes Relative: 7 %
Neutro Abs: 3.8 10*3/uL (ref 1.7–7.7)
Neutrophils Relative %: 65 %
Platelets: 226 10*3/uL (ref 150–400)
RBC: 5.18 MIL/uL — ABNORMAL HIGH (ref 3.87–5.11)
RDW: 12.6 % (ref 11.5–15.5)
WBC: 5.7 10*3/uL (ref 4.0–10.5)
nRBC: 0 % (ref 0.0–0.2)

## 2021-12-22 LAB — COMPREHENSIVE METABOLIC PANEL
ALT: 15 U/L (ref 0–44)
AST: 26 U/L (ref 15–41)
Albumin: 4.2 g/dL (ref 3.5–5.0)
Alkaline Phosphatase: 76 U/L (ref 38–126)
Anion gap: 6 (ref 5–15)
BUN: 14 mg/dL (ref 8–23)
CO2: 26 mmol/L (ref 22–32)
Calcium: 8.9 mg/dL (ref 8.9–10.3)
Chloride: 107 mmol/L (ref 98–111)
Creatinine, Ser: 0.79 mg/dL (ref 0.44–1.00)
GFR, Estimated: >60 mL/min (ref >60)
Glucose, Bld: 85 mg/dL (ref 70–99)
Potassium: 4.3 mmol/L (ref 3.5–5.1)
Sodium: 139 mmol/L (ref 135–145)
Total Bilirubin: 1.1 mg/dL (ref 0.3–1.2)
Total Protein: 7.9 g/dL (ref 6.5–8.1)

## 2021-12-22 NOTE — Progress Notes (Signed)
Met with patient and her niece at initial medical oncology appointment.   Reviewed Breast Cancer treatment handbook.  PET scan and MRI scheduled.

## 2021-12-23 LAB — CANCER ANTIGEN 15-3: CA 15-3: 46.2 U/mL — ABNORMAL HIGH (ref 0.0–25.0)

## 2021-12-23 LAB — CANCER ANTIGEN 27.29: CA 27.29: 68.4 U/mL — ABNORMAL HIGH (ref 0.0–38.6)

## 2021-12-23 NOTE — Progress Notes (Signed)
Hematology/Oncology Consult note [facility] Telephone:([phone] [phone]  Patient Care Team: Rusty Aus, MD as PCP - General (Internal Medicine) Daiva Huge, RN as Oncology Nurse Navigator   Name of the patient: Patricia Miller  [id number]  Jul 08, 1945    Reason for referral-new diagnosis of breast cancer   Referring physician-Dr. Emily Filbert  Date of visit: 12/23/21   History of presenting illness-patient is a 76 year old female with no significant comorbidities.She self palpated a breast mass in her left breast which led to a diagnostic bilateral mammogram as well as ultrasound.  Showed a irregular mass measuring 2.3 x 3.2 x 3.6 cm.  There were 2 other smaller masses in the left breast measuring 4 x 7 x 9 mm and 3 x 5 x 5 mm.  7 abnormal lymph nodes in the left axilla.  Indeterminate solid and cystic mass in the right breast in the retroareolar region measuring 1 x 1.2 x 1.9 cm as well as another 4.5 cm group of indeterminate microcalcifications in the lower right breast.  Patient had 2 left breast biopsy along with left lymph node biopsy.  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  Patient is otherwise relatively healthy with no significant comorbidities.Menarche at the age of 46.  Menopause in her 74s.  She used OC pills in the past.  She has 1 daughter who has special needs.  Family history significant for colon cancer in her maternal aunt.No family history of breast cancer.  ECOG PS- 1  Pain scale- 0   Review of systems- Review of Systems  Constitutional:  Negative for chills, fever, malaise/fatigue and weight loss.  HENT:  Negative for congestion, ear  discharge and nosebleeds.   Eyes:  Negative for blurred vision.  Respiratory:  Negative for cough, hemoptysis, sputum production, shortness of breath and wheezing.   Cardiovascular:  Negative for chest pain, palpitations, orthopnea and claudication.  Gastrointestinal:  Negative for abdominal pain, blood in stool, constipation, diarrhea, heartburn, melena, nausea and vomiting.  Genitourinary:  Negative for dysuria, flank pain, frequency, hematuria and urgency.  Musculoskeletal:  Negative for back pain, joint pain and myalgias.  Skin:  Negative for rash.  Neurological:  Negative for dizziness, tingling, focal weakness, seizures, weakness and headaches.  Endo/Heme/Allergies:  Does not bruise/bleed easily.  Psychiatric/Behavioral:  Negative for depression and suicidal ideas. The patient does not have insomnia.     No Known Allergies  Patient Active Problem List   Diagnosis Date Noted   Cancer ([place]) 12/22/2021   Premature ventricular contractions 12/05/2020   Bronchiectasis without complication ([place]) 79/07/4095   Alzheimer's dementia without behavioral disturbance ([place]) 06/29/2020   Memory loss    Acute blood loss anemia    Acute pulmonary embolism with acute cor pulmonale ([place]) 02/29/2020   Acute deep vein thrombosis (DVT) of femoral vein of left lower extremity ([place]) 02/29/2020   RLL pneumonia 02/29/2020   Pulmonary nodule 02/29/2020   Hormone replacement therapy (HRT) 02/29/2020   S/P laparoscopic hysterectomy 12/2019 02/29/2020   HTN (hypertension) 02/29/2020   Hypokalemia    Hyperlipidemia, mixed 12/15/2019   History of squamous cell carcinoma 02/03/2018   Medicare annual wellness visit, initial 02/03/2018    Hx of Tubular adenoma 11/29/2016  Hormone replacement therapy (postmenopausal) 03/22/2015   A-V fistula ([place]) 11/08/2013     Past Medical History:  Diagnosis Date   Anxiety    Arthritis    NECK   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 path pending   BREAST BIOPSY Right 12/19/2021   Stereo bx-calcs, "COIL" clip-path pending   BREAST BIOPSY Right 12/19/2021   u/s bx-mass, 3:00, retroareolar, "HEART" clip-path pending   COLONOSCOPY     COLONOSCOPY WITH PROPOFOL N/A 02/20/2017   Procedure: COLONOSCOPY WITH PROPOFOL;  Surgeon: Manya Silvas, MD;  Location: [facility] ENDOSCOPY;  Service: Endoscopy;  Laterality: N/A;   HYSTEROSCOPY WITH D & C N/A 07/29/2015   Procedure: DILATATION AND CURETTAGE /HYSTEROSCOPY;  Surgeon: Benjaman Kindler, MD;  Location: [facility] ORS;  Service: Gynecology;  Laterality: N/A;   HYSTEROSCOPY WITH D & C N/A 01/24/2018   Procedure: DILATATION AND CURETTAGE /HYSTEROSCOPY;  Surgeon: Benjaman Kindler, MD;  Location: [facility] ORS;  Service: Gynecology;  Laterality: N/A;   LAPAROSCOPY N/A 01/24/2018   Procedure: LAPAROSCOPY DIAGNOSTIC;  Surgeon: Benjaman Kindler, MD;  Location: [facility] ORS;  Service: Gynecology;  Laterality: N/A;   PERIPHERAL VASCULAR THROMBECTOMY N/A 02/29/2020   Procedure: PERIPHERAL VASCULAR THROMBECTOMY / THROMBOLYSIS WITH POSSIBLE PULMONARY THROMBECTOMY / THROMBOLYSIS;  Surgeon: Algernon Huxley, MD;  Location: [place] CV LAB;  Service: Cardiovascular;  Laterality: N/A;   TONSILLECTOMY     AGE 59   TOTAL LAPAROSCOPIC HYSTERECTOMY WITH BILATERAL SALPINGO OOPHORECTOMY Bilateral 12/28/2019   Procedure: TOTAL LAPAROSCOPIC HYSTERECTOMY WITH BILATERAL SALPINGO OOPHORECTOMY;  Surgeon: Benjaman Kindler, MD;  Location: [facility] ORS;  Service: Gynecology;  Laterality: Bilateral;   TUBAL LIGATION      Social History   Socioeconomic History    Marital status: Divorced    Spouse name: Not on file   Number of children: Not on file   Years of education: Not on file   Highest education level: Not on file  Occupational History   Not on file  Tobacco Use   Smoking status: Former    Packs/day: 0.50    Years: 15.00    Total pack years: 7.50    Types: Cigarettes    Quit date: 07/21/2000    Years since quitting: 21.4   Smokeless tobacco: Never  Vaping Use   Vaping Use: Never used  Substance and Sexual Activity   Alcohol use: Yes    Comment: RARE WINE MONTHLY   Drug use: No   Sexual activity: Not Currently  Other Topics Concern   Not on file  Social History Narrative   Not on file   Social Determinants of Health   Financial Resource Strain: Not on file  Food Insecurity: Not on file  Transportation Needs: Not on file  Physical Activity: Not on file  Stress: Not on file  Social Connections: Not on file  Intimate Partner Violence: Not on file     Family History  Problem Relation Age of Onset   Stomach cancer Sister    Hypertension Sister    Hypertension  Sister    Diabetes Niece    Hypertension Niece    Breast cancer Neg Hx      Current Outpatient Medications:    buPROPion (WELLBUTRIN XL) 150 MG 24 hr tablet, Take 150 mg by mouth every morning.  (Patient not taking: Reported on 10/11/2020), Disp: , Rfl: 6   Calcium Carbonate-Vitamin D (OS-CAL 500 + D PO), Take 1 tablet by mouth daily.  (Patient not taking: Reported on 12/22/2021), Disp: , Rfl:    cetirizine (ZYRTEC) 10 MG tablet, Take 10 mg by mouth daily as needed for allergies.  (Patient not taking: Reported on 12/22/2021), Disp: , Rfl:    Cholecalciferol (DIALYVITE VITAMIN D 5000) 125 MCG (5000 UT) capsule, Take 5,000 Units by mouth daily. (Patient not taking: Reported on 12/22/2021), Disp: , Rfl:    docusate sodium (COLACE) 100 MG capsule, Take 1 capsule (100 mg total) by mouth 2 (two) times daily. To keep stools soft (Patient not taking: Reported on 02/29/2020),  Disp: 30 capsule, Rfl: 0   estrogen, conjugated,-medroxyprogesterone (PREMPRO) 0.625-5 MG tablet, Take 1 tablet by mouth every evening.  (Patient not taking: Reported on 12/22/2021), Disp: , Rfl:    Lutein 40 MG CAPS, Take 40 mg by mouth daily. (Patient not taking: Reported on 12/22/2021), Disp: , Rfl:    oxyCODONE (OXY IR/ROXICODONE) 5 MG immediate release tablet, Take 1 tablet (5 mg total) by mouth every 4 (four) hours as needed for severe pain. (Patient not taking: Reported on 02/29/2020), Disp: 20 tablet, Rfl: 0   rivaroxaban (XARELTO) 20 MG TABS tablet, 1 tablet (20 mg total) daily with breakfast (Patient not taking: Reported on 10/11/2020), Disp: , Rfl:    vitamin B-12 (CYANOCOBALAMIN) 1000 MCG tablet, Take 1,000 mcg by mouth daily.  (Patient not taking: Reported on 12/22/2021), Disp: , Rfl:    XARELTO 20 MG TABS tablet, TAKE 1 TABLET BY MOUTH EVERY DAY (Patient not taking: Reported on 10/11/2020), Disp: 90 tablet, Rfl: 1   Physical exam:  Vitals:   12/22/21 1054  BP: (!) 154/84  Pulse: 65  Resp: 14  SpO2: 97%  Weight: 104 lb 12.8 oz (47.5 kg)  Height: 4' 11"  (1.499 m)   Physical Exam Constitutional:      General: She is not in acute distress. Cardiovascular:     Rate and Rhythm: Normal rate and regular rhythm.     Heart sounds: Normal heart sounds.  Pulmonary:     Effort: Pulmonary effort is normal.     Breath sounds: Normal breath sounds.  Abdominal:     General: Bowel sounds are normal.     Palpations: Abdomen is soft.  Skin:    General: Skin is warm and dry.  Neurological:     Mental Status: She is alert and oriented to person, place, and time.     Breast exam: There is ill-defined large palpable mass roughly 4 cm in size in the upper outer quadrant of the left breast extending from 2 cm to 4 cm.  No distinct palpable left axillary adenopathy.  No palpable masses in the right breast or right axillary adenopathy.      Latest Ref Rng & Units 12/22/2021   11:55 AM  CMP   Glucose 70 - 99 mg/dL 85   BUN 8 - 23 mg/dL 14   Creatinine 0.44 - 1.00 mg/dL 0.79   Sodium 135 - 145 mmol/L 139   Potassium 3.5 - 5.1 mmol/L 4.3   Chloride 98 - 111 mmol/L 107   CO2  22 - 32 mmol/L 26   Calcium 8.9 - 10.3 mg/dL 8.9   Total Protein 6.5 - 8.1 g/dL 7.9   Total Bilirubin 0.3 - 1.2 mg/dL 1.1   Alkaline Phos 38 - 126 U/L 76   AST 15 - 41 U/L 26   ALT 0 - 44 U/L 15       Latest Ref Rng & Units 12/22/2021   11:55 AM  CBC  WBC 4.0 - 10.5 K/uL 5.7   Hemoglobin 12.0 - 15.0 g/dL 16.1   Hematocrit 36.0 - 46.0 % 49.0   Platelets 150 - 400 K/uL 226       [place] RT BREAST BX W LOC DEV 1ST LESION IMG BX SPEC US GUIDE  Addendum Date: 12/21/2021   ADDENDUM REPORT: 12/21/2021 14:03 ADDENDUM: PATHOLOGY revealed: Site A. BREAST MASS, RIGHT RETROAREOLAR, 3:00 o'clock. 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 Comment: The differential diagnosis for the findings in part A includes atypical ductal hyperplasia (ADH) involving an intraductal papilloma with sclerosis. Correlation with radiographic findings is required. Pathology results are CONCORDANT with imaging findings, per Dr. Audie Pinto with excision recommended. PATHOLOGY revealed: Site B. BREAST CALCIFICATIONS, RIGHT LOWER OUTER; STEREOTACTIC BIOPSY: - POLARIZABLE CALCIUM OXALATE. - CALCIFICATIONS ASSOCIATED WITH COLUMNAR CELL CHANGE. - DUCT ECTASIA, APOCRINE METAPLASIA, AND SCLEROSING ADENOSIS. - NEGATIVE FOR ATYPIA AND MALIGNANCY. Pathology results are CONCORDANT with imaging findings, per Dr. Audie Pinto. Pathology results and recommendations below were discussed with patient's niece and POA Maximiano Coss) by Electa Sniff RN via telephone on 12/20/2021. Margaretha Sheffield reported biopsy site within normal limits with no adverse symptoms. RECOMMENDATIONS: 1. Continue with current treatment plan for newly diagnosed LEFT breast cancer. 2. Surgical consultation for Site A - RIGHT breast. Surgical  consultation relayed to Casper Harrison RN at [facility] by Electa Sniff RN on 12/20/2021. Patient met with surgeon (Dr. Herbert Pun) on 12/20/2021 per niece Maximiano Coss) report. 3. If breast conversation desired, six month follow-up mammogram on site B to ensure stability of biopsied area. Pathology results reported by Electa Sniff RN on 12/21/2021. Electronically Signed   By: Audie Pinto M.D.   On: 12/21/2021 14:03   Result Date: 12/21/2021 CLINICAL DATA:  76 year old female presenting for biopsies of the right breast. EXAM: ULTRASOUND GUIDED RIGHT BREAST CORE NEEDLE BIOPSY COMPARISON:  Previous exam(s). PROCEDURE: I met with the patient and we discussed the procedure of ultrasound-guided biopsy, including benefits and alternatives. We discussed the high likelihood of a successful procedure. We discussed the risks of the procedure, including infection, bleeding, tissue injury, clip migration, and inadequate sampling. Informed written consent was given. The usual time-out protocol was performed immediately prior to the procedure. Lesion quadrant: Lower inner quadrant Using sterile technique and 1% Lidocaine as local anesthetic, under direct ultrasound visualization, a 14 gauge spring-loaded device was used to perform biopsy of a mass in the right breast at 3 o'clock retroareolar using a medial approach. At the conclusion of the procedure a heart shaped tissue marker clip was deployed into the biopsy cavity. Follow up 2 view mammogram was performed and dictated separately. IMPRESSION: Ultrasound guided biopsy of a mass in the right breast at 3 o'clock retroareolar. No apparent complications. Electronically Signed: By: Audie Pinto M.D. On: 12/19/2021 09:32   MM RT BREAST BX W LOC DEV 1ST LESION IMAGE BX SPEC STEREO GUIDE  Addendum Date: 12/21/2021   ADDENDUM REPORT: 12/21/2021 14:03 ADDENDUM: PATHOLOGY revealed: Site A. BREAST MASS, RIGHT RETROAREOLAR, 3:00 o'clock.  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 Comment: The differential diagnosis for the findings in part A includes atypical ductal hyperplasia (ADH) involving an intraductal papilloma with sclerosis. Correlation with radiographic findings is required. Pathology results are CONCORDANT with imaging findings, per Dr. Audie Pinto with excision recommended. PATHOLOGY revealed: Site B. BREAST CALCIFICATIONS, RIGHT LOWER OUTER; STEREOTACTIC BIOPSY: - POLARIZABLE CALCIUM OXALATE. - CALCIFICATIONS ASSOCIATED WITH COLUMNAR CELL CHANGE. - DUCT ECTASIA, APOCRINE METAPLASIA, AND SCLEROSING ADENOSIS. - NEGATIVE FOR ATYPIA AND MALIGNANCY. Pathology results are CONCORDANT with imaging findings, per Dr. Audie Pinto. Pathology results and recommendations below were discussed with patient's niece and POA Maximiano Coss) by Electa Sniff RN via telephone on 12/20/2021. Margaretha Sheffield reported biopsy site within normal limits with no adverse symptoms. RECOMMENDATIONS: 1. Continue with current treatment plan for newly diagnosed LEFT breast cancer. 2. Surgical consultation for Site A - RIGHT breast. Surgical consultation relayed to Casper Harrison RN at [facility] by Electa Sniff RN on 12/20/2021. Patient met with surgeon (Dr. Herbert Pun) on 12/20/2021 per niece Maximiano Coss) report. 3. If breast conversation desired, six month follow-up mammogram on site B to ensure stability of biopsied area. Pathology results reported by Electa Sniff RN on 12/21/2021. Electronically Signed   By: Audie Pinto M.D.   On: 12/21/2021 14:03   Result Date: 12/21/2021 CLINICAL DATA:  76 year old female presenting for right breast biopsies. EXAM: RIGHT BREAST STEREOTACTIC CORE NEEDLE BIOPSY COMPARISON:  Previous exam(s). FINDINGS: The patient and I discussed the procedure of stereotactic-guided biopsy including benefits and alternatives. We discussed the high likelihood of a successful  procedure. We discussed the risks of the procedure including infection, bleeding, tissue injury, clip migration, and inadequate sampling. Informed written consent was given. The usual time out protocol was performed immediately prior to the procedure. Using sterile technique and 1% Lidocaine as local anesthetic, under stereotactic guidance, a 9 gauge vacuum assisted device was used to perform core needle biopsy of calcifications in the lower outer right breast using a lateral approach. Specimen radiograph was performed showing at least 3 specimens with calcifications. Specimens with calcifications are identified for pathology. Lesion quadrant: Lower outer quadrant At the conclusion of the procedure, a coil shaped tissue marker clip was deployed into the biopsy cavity. Follow-up 2-view mammogram was performed and dictated separately. IMPRESSION: Stereotactic-guided biopsy of calcifications in the lower outer right breast. No apparent complications. Electronically Signed: By: Audie Pinto M.D. On: 12/19/2021 09:33   MM CLIP PLACEMENT RIGHT  Result Date: 12/19/2021 CLINICAL DATA:  Post procedure mammogram for clip placement EXAM: 3D DIAGNOSTIC LEFT MAMMOGRAM POST ULTRASOUND AND STEREOTACTIC BIOPSY COMPARISON:  Previous exam(s). FINDINGS: 3D Mammographic images were obtained following ultrasound guided biopsy of a mass in the right breast at 3 o'clock retroareolar. The heart biopsy marking clip is in expected position at the site of biopsy. 3D Mammographic images were obtained following stereotactic guided biopsy of calcifications in the lower outer right breast. The coil shaped biopsy marking clip appears displaced medially by approximately 3.7 cm. There are residual calcifications. IMPRESSION: Appropriate positioning of the heart shaped biopsy marking clip at the site of biopsy in the right breast at 3 o'clock retroareolar. The coil shaped biopsy marking clip appears displaced medially by approximately 3.7  cm. There are residual calcifications that could be used for localization if needed. Final Assessment: Post Procedure Mammograms for Marker Placement Electronically Signed   By: Audie Pinto M.D.   On: 12/19/2021 09:26  [place] AXILLARY NODE CORE BIOPSY LEFT  Addendum Date:  12/15/2021   ADDENDUM REPORT: 12/15/2021 16:29 ADDENDUM: PATHOLOGY revealed: Site A. 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 - NEGATIVE FOR ATYPICAL PROLIFERATIVE BREAST DISEASE. Pathology results are CONCORDANT with imaging findings, per Dr. Hassan Rowan with excision recommended. PATHOLOGY revealed: Site B. BREAST, LEFT AT 3:00, 7 CM FROM THE NIPPLE; ULTRASOUND-GUIDED CORE NEEDLE BIOPSY: - INVASIVE MAMMARY CARCINOMA, WITH SQUAMOUS METAPLASIA AND KERATINIZATION. Size of invasive carcinoma: 10 mm in this sample. Grade 3. Ductal carcinoma in situ: Present, high-grade with comedonecrosis. Lymphovascular invasion: Not identified. 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 Definitive classification is dependent upon final excision. Pathology results are CONCORDANT with imaging findings, per Dr. Hassan Rowan. PATHOLOGY revealed: Site C. LYMPH NODE, LEFT AXILLARY; ULTRASOUND-GUIDED CORE NEEDLE BIOPSY: - POSITIVE FOR MALIGNANCY. 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 Pathology results are CONCORDANT with imaging findings, per Dr. Hassan Rowan. Pathology results and recommendations below were discussed with patient and patient's POA Maximiano Coss) by telephone on 12/15/2021. Patient reported biopsy site within normal limits with slight tenderness at the site. Post biopsy care instructions were reviewed, questions were answered and my direct phone number was provided to patient. Patient was instructed to call [facility] if any concerns  or questions arise related to the biopsy. RECOMMENDATIONS: 1. Surgical and oncological consultation. Request for surgical and oncological consultation relayed to Casper Harrison RN at [facility] by Electa Sniff RN on 12/15/2021. 2. Patient has RIGHT breast stereotactic guided biopsy scheduled for 12/19/2021. Pathology results reported by Electa Sniff RN on 12/15/2021. Electronically Signed   By: Margarette Canada M.D.   On: 12/15/2021 16:29   Result Date: 12/15/2021 CLINICAL DATA:  76 year old female for tissue sampling of a 0.5 cm UPPER INNER LEFT breast mass, a 3.6 cm OUTER LEFT breast mass, and an enlarged LEFT axillary lymph node. EXAM: ULTRASOUND GUIDED LEFT BREAST CORE NEEDLE BIOPSY X 2 [place] AXILLARY NODE CORE BIOPSY LEFT COMPARISON:  None Available. PROCEDURE: I met with the patient and we discussed the procedure of ultrasound-guided biopsy, including benefits and alternatives. We discussed the high likelihood of a successful procedure. We discussed the risks of the procedure, including infection, bleeding, tissue injury, clip migration, and inadequate sampling. Informed written consent was given. The usual time-out protocol was performed immediately prior to the procedure. ULTRASOUND GUIDED LEFT BREAST CORE NEEDLE BIOPSY #1 (0.5 cm UPPER INNER LEFT breast mass-VENUS clip): Using sterile technique and 1% Lidocaine as local anesthetic, under direct ultrasound visualization, a 12 gauge spring-loaded device was used to perform biopsy of the 0.5 cm mass at the 10 o'clock position of the LEFT breast using a MEDIAL approach. At the conclusion of the procedure a VENUS tissue marker clip was deployed into the biopsy cavity. Follow up 2 view mammogram was performed and dictated separately. ULTRASOUND GUIDED LEFT BREAST CORE NEEDLE BIOPSY #2 (3.6 cm OUTER LEFT breast mass-COIL clip): Using sterile technique and 1% Lidocaine as local anesthetic, under direct ultrasound visualization, a 12 gauge spring-loaded device was  used to perform biopsy of the 3.6 cm mass at the 3 o'clock position of the LEFT breast using a superomedial approach. At the conclusion of the procedure a COIL shaped tissue marker clip was deployed into the biopsy cavity. Follow up 2 view mammogram was performed and dictated separately. [place] AXILLARY NODE CORE BIOPSY LEFT Using sterile technique and  1% Lidocaine as local anesthetic, under direct ultrasound visualization, a 12 gauge spring-loaded device was used to perform biopsy of an abnormal appearing lymph node in the LEFT axilla using a MEDIAL approach. The lowest abnormal LEFT axillary lymph node was not targeted due to an overlying artery. At the conclusion of the procedure a HydroMARK tissue marker clip was deployed into the biopsy cavity. Follow up 2 view mammogram was performed and dictated separately. IMPRESSION: Ultrasound guided biopsy of 0.5 cm UPPER INNER LEFT breast mass (VENUS clip). Ultrasound guided biopsy of 3.6 cm OUTER LEFT breast mass (RIBBON clip). Ultrasound-guided biopsy of an abnormal LEFT axillary lymph node. No apparent complications. Electronically Signed: By: Margarette Canada M.D. On: 12/14/2021 11:17   [place] LT BREAST BX W LOC DEV 1ST LESION IMG BX SPEC US GUIDE  Addendum Date: 12/15/2021   ADDENDUM REPORT: 12/15/2021 16:29 ADDENDUM: PATHOLOGY revealed: Site A. 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 - NEGATIVE FOR ATYPICAL PROLIFERATIVE BREAST DISEASE. Pathology results are CONCORDANT with imaging findings, per Dr. Hassan Rowan with excision recommended. PATHOLOGY revealed: Site B. BREAST, LEFT AT 3:00, 7 CM FROM THE NIPPLE; ULTRASOUND-GUIDED CORE NEEDLE BIOPSY: - INVASIVE MAMMARY CARCINOMA, WITH SQUAMOUS METAPLASIA AND KERATINIZATION. Size of invasive carcinoma: 10 mm in this sample. Grade 3. Ductal carcinoma in situ: Present, high-grade with comedonecrosis. Lymphovascular  invasion: Not identified. 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 Definitive classification is dependent upon final excision. Pathology results are CONCORDANT with imaging findings, per Dr. Hassan Rowan. PATHOLOGY revealed: Site C. LYMPH NODE, LEFT AXILLARY; ULTRASOUND-GUIDED CORE NEEDLE BIOPSY: - POSITIVE FOR MALIGNANCY. 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 Pathology results are CONCORDANT with imaging findings, per Dr. Hassan Rowan. Pathology results and recommendations below were discussed with patient and patient's POA Maximiano Coss) by telephone on 12/15/2021. Patient reported biopsy site within normal limits with slight tenderness at the site. Post biopsy care instructions were reviewed, questions were answered and my direct phone number was provided to patient. Patient was instructed to call [facility] if any concerns or questions arise related to the biopsy. RECOMMENDATIONS: 1. Surgical and oncological consultation. Request for surgical and oncological consultation relayed to Casper Harrison RN at [facility] by Electa Sniff RN on 12/15/2021. 2. Patient has RIGHT breast stereotactic guided biopsy scheduled for 12/19/2021. Pathology results reported by Electa Sniff RN on 12/15/2021. Electronically Signed   By: Margarette Canada M.D.   On: 12/15/2021 16:29   Result Date: 12/15/2021 CLINICAL DATA:  76 year old female for tissue sampling of a 0.5 cm UPPER INNER LEFT breast mass, a 3.6 cm OUTER LEFT breast mass, and an enlarged LEFT axillary lymph node. EXAM: ULTRASOUND GUIDED LEFT BREAST CORE NEEDLE BIOPSY X 2 [place] AXILLARY NODE CORE BIOPSY LEFT COMPARISON:  None Available. PROCEDURE: I met with the patient and we discussed the procedure of ultrasound-guided biopsy, including benefits and alternatives. We discussed the high likelihood of a successful procedure. We discussed the risks of the procedure,  including infection, bleeding, tissue injury, clip migration, and inadequate sampling. Informed written consent was given. The usual time-out protocol was performed immediately prior to the procedure. ULTRASOUND GUIDED LEFT BREAST CORE NEEDLE BIOPSY #1 (0.5 cm UPPER INNER LEFT breast mass-VENUS clip): Using sterile technique and 1% Lidocaine as local anesthetic, under direct ultrasound visualization, a 12 gauge spring-loaded device was used  to perform biopsy of the 0.5 cm mass at the 10 o'clock position of the LEFT breast using a MEDIAL approach. At the conclusion of the procedure a VENUS tissue marker clip was deployed into the biopsy cavity. Follow up 2 view mammogram was performed and dictated separately. ULTRASOUND GUIDED LEFT BREAST CORE NEEDLE BIOPSY #2 (3.6 cm OUTER LEFT breast mass-COIL clip): Using sterile technique and 1% Lidocaine as local anesthetic, under direct ultrasound visualization, a 12 gauge spring-loaded device was used to perform biopsy of the 3.6 cm mass at the 3 o'clock position of the LEFT breast using a superomedial approach. At the conclusion of the procedure a COIL shaped tissue marker clip was deployed into the biopsy cavity. Follow up 2 view mammogram was performed and dictated separately. [place] AXILLARY NODE CORE BIOPSY LEFT Using sterile technique and 1% Lidocaine as local anesthetic, under direct ultrasound visualization, a 12 gauge spring-loaded device was used to perform biopsy of an abnormal appearing lymph node in the LEFT axilla using a MEDIAL approach. The lowest abnormal LEFT axillary lymph node was not targeted due to an overlying artery. At the conclusion of the procedure a HydroMARK tissue marker clip was deployed into the biopsy cavity. Follow up 2 view mammogram was performed and dictated separately. IMPRESSION: Ultrasound guided biopsy of 0.5 cm UPPER INNER LEFT breast mass (VENUS clip). Ultrasound guided biopsy of 3.6 cm OUTER LEFT breast mass (RIBBON clip).  Ultrasound-guided biopsy of an abnormal LEFT axillary lymph node. No apparent complications. Electronically Signed: By: Margarette Canada M.D. On: 12/14/2021 11:17   [place] LT BREAST BX W LOC DEV EA ADD LESION IMG BX SPEC US GUIDE  Addendum Date: 12/15/2021   ADDENDUM REPORT: 12/15/2021 16:29 ADDENDUM: PATHOLOGY revealed: Site A. 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 - NEGATIVE FOR ATYPICAL PROLIFERATIVE BREAST DISEASE. Pathology results are CONCORDANT with imaging findings, per Dr. Hassan Rowan with excision recommended. PATHOLOGY revealed: Site B. BREAST, LEFT AT 3:00, 7 CM FROM THE NIPPLE; ULTRASOUND-GUIDED CORE NEEDLE BIOPSY: - INVASIVE MAMMARY CARCINOMA, WITH SQUAMOUS METAPLASIA AND KERATINIZATION. Size of invasive carcinoma: 10 mm in this sample. Grade 3. Ductal carcinoma in situ: Present, high-grade with comedonecrosis. Lymphovascular invasion: Not identified. 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 Definitive classification is dependent upon final excision. Pathology results are CONCORDANT with imaging findings, per Dr. Hassan Rowan. PATHOLOGY revealed: Site C. LYMPH NODE, LEFT AXILLARY; ULTRASOUND-GUIDED CORE NEEDLE BIOPSY: - POSITIVE FOR MALIGNANCY. 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 Pathology results are CONCORDANT with imaging findings, per Dr. Hassan Rowan. Pathology results and recommendations below were discussed with patient and patient's POA Maximiano Coss) by telephone on 12/15/2021. Patient reported biopsy site within normal limits with slight tenderness at the site. Post biopsy care instructions were reviewed, questions were answered and my direct phone number was provided to patient. Patient was instructed to call [facility] if any concerns or questions arise related to the  biopsy. RECOMMENDATIONS: 1. Surgical and oncological consultation. Request for surgical and oncological consultation relayed to Casper Harrison RN at [facility] by Electa Sniff RN on 12/15/2021. 2. Patient has RIGHT breast stereotactic guided biopsy scheduled for 12/19/2021. Pathology results reported by Electa Sniff RN on 12/15/2021. Electronically Signed   By: Margarette Canada M.D.   On: 12/15/2021 16:29   Result Date: 12/15/2021 CLINICAL DATA:  76 year old  female for tissue sampling of a 0.5 cm UPPER INNER LEFT breast mass, a 3.6 cm OUTER LEFT breast mass, and an enlarged LEFT axillary lymph node. EXAM: ULTRASOUND GUIDED LEFT BREAST CORE NEEDLE BIOPSY X 2 [place] AXILLARY NODE CORE BIOPSY LEFT COMPARISON:  None Available. PROCEDURE: I met with the patient and we discussed the procedure of ultrasound-guided biopsy, including benefits and alternatives. We discussed the high likelihood of a successful procedure. We discussed the risks of the procedure, including infection, bleeding, tissue injury, clip migration, and inadequate sampling. Informed written consent was given. The usual time-out protocol was performed immediately prior to the procedure. ULTRASOUND GUIDED LEFT BREAST CORE NEEDLE BIOPSY #1 (0.5 cm UPPER INNER LEFT breast mass-VENUS clip): Using sterile technique and 1% Lidocaine as local anesthetic, under direct ultrasound visualization, a 12 gauge spring-loaded device was used to perform biopsy of the 0.5 cm mass at the 10 o'clock position of the LEFT breast using a MEDIAL approach. At the conclusion of the procedure a VENUS tissue marker clip was deployed into the biopsy cavity. Follow up 2 view mammogram was performed and dictated separately. ULTRASOUND GUIDED LEFT BREAST CORE NEEDLE BIOPSY #2 (3.6 cm OUTER LEFT breast mass-COIL clip): Using sterile technique and 1% Lidocaine as local anesthetic, under direct ultrasound visualization, a 12 gauge spring-loaded device was used to perform biopsy of the 3.6  cm mass at the 3 o'clock position of the LEFT breast using a superomedial approach. At the conclusion of the procedure a COIL shaped tissue marker clip was deployed into the biopsy cavity. Follow up 2 view mammogram was performed and dictated separately. [place] AXILLARY NODE CORE BIOPSY LEFT Using sterile technique and 1% Lidocaine as local anesthetic, under direct ultrasound visualization, a 12 gauge spring-loaded device was used to perform biopsy of an abnormal appearing lymph node in the LEFT axilla using a MEDIAL approach. The lowest abnormal LEFT axillary lymph node was not targeted due to an overlying artery. At the conclusion of the procedure a HydroMARK tissue marker clip was deployed into the biopsy cavity. Follow up 2 view mammogram was performed and dictated separately. IMPRESSION: Ultrasound guided biopsy of 0.5 cm UPPER INNER LEFT breast mass (VENUS clip). Ultrasound guided biopsy of 3.6 cm OUTER LEFT breast mass (RIBBON clip). Ultrasound-guided biopsy of an abnormal LEFT axillary lymph node. No apparent complications. Electronically Signed: By: Margarette Canada M.D. On: 12/14/2021 11:17   MM CLIP PLACEMENT LEFT  Result Date: 12/14/2021 CLINICAL DATA:  Evaluate placement of VENUS, COIL and HydroMARK biopsy clips following ultrasound-guided LEFT breast and axillary biopsies. EXAM: 3D DIAGNOSTIC LEFT MAMMOGRAM POST ULTRASOUND BIOPSY COMPARISON:  Previous exam(s). FINDINGS: 3D Mammographic images of the LEFT breast were obtained following ultrasound guided biopsy of the 0.5 cm mass at the 10 o'clock position (VENUS clip), the 3.6 cm mass at the 3 o'clock position (COIL clip) and an abnormal LEFT axillary lymph node (HydroMARK clip). The VENUS biopsy marking clip is in expected position at the site of biopsy. The COIL biopsy marking clip is in expected position at the site of biopsy. The [facility] clip is in expected position at the site of biopsy. IMPRESSION: Appropriate positioning of the VENUS shaped biopsy  marking clip at the site of biopsy in the UPPER INNER LEFT breast. Appropriate positioning of the COIL shaped biopsy marking clip at the site of biopsy in the OUTER LEFT breast. Appropriate positioning of the [facility] biopsy marking clip at the site of biopsy in the LEFT axilla. Final Assessment: Post Procedure Mammograms for  Marker Placement Electronically Signed   By: Margarette Canada M.D.   On: 12/14/2021 11:37  MM DIAG BREAST TOMO BILATERAL  Result Date: 11/27/2021 CLINICAL DATA:  Patient presents for bilateral diagnostic examination due to a palpable abnormality over the outer lower left breast. EXAM: DIGITAL DIAGNOSTIC BILATERAL MAMMOGRAM WITH TOMOSYNTHESIS AND CAD; ULTRASOUND LEFT BREAST LIMITED; ULTRASOUND RIGHT BREAST LIMITED TECHNIQUE: Bilateral digital diagnostic mammography and breast tomosynthesis was performed. The images were evaluated with computer-aided detection.; Targeted ultrasound examination of the left breast was performed.; Targeted ultrasound examination of the right breast was performed COMPARISON:  Previous exam(s). ACR Breast Density Category c: The breast tissue is heterogeneously dense, which may obscure small masses. FINDINGS: Examination demonstrates an irregular mass with associated pleomorphic microcalcifications over the outer mid to lower left breast accounting for patient's palpable abnormality. There is increased density within the left retroareolar region with mild nipple retraction and associated periareolar skin thickening. There are a few scattered and grouped indeterminate microcalcifications extending from the palpable mass in the outer mid breast to the retroareolar region. Minimal increased patchy asymmetric density from the left retroareolar region to the palpable abnormality in the outer mid breast. Interval increased size of several lower left axillary lymph nodes. Segmental distribution of loosely associated indeterminate microcalcifications over the outer mid to  lower right breast in the middle to posterior third spanning approximally 4.5 cm. Oval partially circumscribed and partially obscured mass over the inner midportion of the right periareolar region. Targeted ultrasound is performed, showing a large hypoechoic heterogeneous mass with associated microcalcifications over the 3 o'clock position of the left breast 7 cm from the nipple accounting for patient's palpable abnormality. This mass has irregular and indistinct borders measures 2.3 x 3.2 x 3.6 cm. There is a separate adjacent indeterminate hypoechoic mass also at the 3 o'clock position of the left breast 6 cm from the nipple measuring 4 x 7 x 9 mm as this is approximately 1 cm from the palpable mass. There is a mildly prominent duct extending from the left retroareolar region to the palpable mass at the 3 o'clock position suspicious for extension of neoplastic disease. No definite focal mass is seen in the left retroareolar region. There is a small irregular superficial hypoechoic mass over the 10 o'clock position of the left breast 2 cm from the nipple measuring 3 x 5 x 5 mm. Ultrasound the left axilla demonstrates approximately 7 abnormal lymph nodes with the 2 largest abnormal lymph nodes adjacent to each other in the lower axilla. Ultrasound of the right breast demonstrates an indeterminate solid and cystic mass over the 3 o'clock position of the retroareolar region measuring 1 x 1.2 x 1.9 cm. Ultrasound the right axilla is normal. IMPRESSION: 1. Suspicious 3.6 cm mass over the 3 o'clock position of the left breast 7 cm from the nipple accounting for patient's palpable abnormality. Immediately adjacent indeterminate 9 mm mass at 3 o'clock 6 cm from the nipple. Approximately 7 abnormal axillary lymph nodes. 2. Scattered and grouped indeterminate microcalcifications extending from the palpable left breast mass at the 3 o'clock position 2 to the retroareolar region. 3. Indeterminate 5 mm mass over the 10 o'clock  position of the left breast 2 cm from the nipple. 4. 4.5 cm group of indeterminate microcalcifications over the outer lower right breast. 5. 1.9 cm indeterminate solid and cystic mass over the 3 o'clock position of the right retroareolar region. No abnormal right axillary lymph nodes. RECOMMENDATION: 1. Recommend ultrasound-guided core needle biopsy of the 3.6  cm mass at the 3 o'clock position of the left breast 7 cm from the nipple. Also recommend biopsy of 1 of the abnormal lower left axillary lymph nodes. 2. Recommend ultrasound-guided core needle biopsy of the indeterminate superficial 5 mm mass at the 10 o'clock position of the left periareolar region. 3. Recommend ultrasound-guided core biopsy of the indeterminate 1.9 cm mass over the 3 o'clock position of the right retroareolar region. 4. Recommend stereotactic core needle biopsy of the indeterminate microcalcifications over the outer lower right breast. I have discussed the findings and recommendations with the patient. If applicable, a reminder letter will be sent to the patient regarding the next appointment. BI-RADS CATEGORY  5: Highly suggestive of malignancy. Biopsy will be scheduled here by the administrative staff at the [facility]. Electronically Signed   By: Marin Olp M.D.   On: 11/27/2021 16:41  US BREAST LTD UNI LEFT INC AXILLA  Result Date: 11/27/2021 CLINICAL DATA:  Patient presents for bilateral diagnostic examination due to a palpable abnormality over the outer lower left breast. EXAM: DIGITAL DIAGNOSTIC BILATERAL MAMMOGRAM WITH TOMOSYNTHESIS AND CAD; ULTRASOUND LEFT BREAST LIMITED; ULTRASOUND RIGHT BREAST LIMITED TECHNIQUE: Bilateral digital diagnostic mammography and breast tomosynthesis was performed. The images were evaluated with computer-aided detection.; Targeted ultrasound examination of the left breast was performed.; Targeted ultrasound examination of the right breast was performed COMPARISON:  Previous exam(s).  ACR Breast Density Category c: The breast tissue is heterogeneously dense, which may obscure small masses. FINDINGS: Examination demonstrates an irregular mass with associated pleomorphic microcalcifications over the outer mid to lower left breast accounting for patient's palpable abnormality. There is increased density within the left retroareolar region with mild nipple retraction and associated periareolar skin thickening. There are a few scattered and grouped indeterminate microcalcifications extending from the palpable mass in the outer mid breast to the retroareolar region. Minimal increased patchy asymmetric density from the left retroareolar region to the palpable abnormality in the outer mid breast. Interval increased size of several lower left axillary lymph nodes. Segmental distribution of loosely associated indeterminate microcalcifications over the outer mid to lower right breast in the middle to posterior third spanning approximally 4.5 cm. Oval partially circumscribed and partially obscured mass over the inner midportion of the right periareolar region. Targeted ultrasound is performed, showing a large hypoechoic heterogeneous mass with associated microcalcifications over the 3 o'clock position of the left breast 7 cm from the nipple accounting for patient's palpable abnormality. This mass has irregular and indistinct borders measures 2.3 x 3.2 x 3.6 cm. There is a separate adjacent indeterminate hypoechoic mass also at the 3 o'clock position of the left breast 6 cm from the nipple measuring 4 x 7 x 9 mm as this is approximately 1 cm from the palpable mass. There is a mildly prominent duct extending from the left retroareolar region to the palpable mass at the 3 o'clock position suspicious for extension of neoplastic disease. No definite focal mass is seen in the left retroareolar region. There is a small irregular superficial hypoechoic mass over the 10 o'clock position of the left breast 2 cm from  the nipple measuring 3 x 5 x 5 mm. Ultrasound the left axilla demonstrates approximately 7 abnormal lymph nodes with the 2 largest abnormal lymph nodes adjacent to each other in the lower axilla. Ultrasound of the right breast demonstrates an indeterminate solid and cystic mass over the 3 o'clock position of the retroareolar region measuring 1 x 1.2 x 1.9 cm. Ultrasound the right axilla  is normal. IMPRESSION: 1. Suspicious 3.6 cm mass over the 3 o'clock position of the left breast 7 cm from the nipple accounting for patient's palpable abnormality. Immediately adjacent indeterminate 9 mm mass at 3 o'clock 6 cm from the nipple. Approximately 7 abnormal axillary lymph nodes. 2. Scattered and grouped indeterminate microcalcifications extending from the palpable left breast mass at the 3 o'clock position 2 to the retroareolar region. 3. Indeterminate 5 mm mass over the 10 o'clock position of the left breast 2 cm from the nipple. 4. 4.5 cm group of indeterminate microcalcifications over the outer lower right breast. 5. 1.9 cm indeterminate solid and cystic mass over the 3 o'clock position of the right retroareolar region. No abnormal right axillary lymph nodes. RECOMMENDATION: 1. Recommend ultrasound-guided core needle biopsy of the 3.6 cm mass at the 3 o'clock position of the left breast 7 cm from the nipple. Also recommend biopsy of 1 of the abnormal lower left axillary lymph nodes. 2. Recommend ultrasound-guided core needle biopsy of the indeterminate superficial 5 mm mass at the 10 o'clock position of the left periareolar region. 3. Recommend ultrasound-guided core biopsy of the indeterminate 1.9 cm mass over the 3 o'clock position of the right retroareolar region. 4. Recommend stereotactic core needle biopsy of the indeterminate microcalcifications over the outer lower right breast. I have discussed the findings and recommendations with the patient. If applicable, a reminder letter will be sent to the patient  regarding the next appointment. BI-RADS CATEGORY  5: Highly suggestive of malignancy. Biopsy will be scheduled here by the administrative staff at the [facility]. Electronically Signed   By: Marin Olp M.D.   On: 11/27/2021 16:41  US BREAST LTD UNI RIGHT INC AXILLA  Result Date: 11/27/2021 CLINICAL DATA:  Patient presents for bilateral diagnostic examination due to a palpable abnormality over the outer lower left breast. EXAM: DIGITAL DIAGNOSTIC BILATERAL MAMMOGRAM WITH TOMOSYNTHESIS AND CAD; ULTRASOUND LEFT BREAST LIMITED; ULTRASOUND RIGHT BREAST LIMITED TECHNIQUE: Bilateral digital diagnostic mammography and breast tomosynthesis was performed. The images were evaluated with computer-aided detection.; Targeted ultrasound examination of the left breast was performed.; Targeted ultrasound examination of the right breast was performed COMPARISON:  Previous exam(s). ACR Breast Density Category c: The breast tissue is heterogeneously dense, which may obscure small masses. FINDINGS: Examination demonstrates an irregular mass with associated pleomorphic microcalcifications over the outer mid to lower left breast accounting for patient's palpable abnormality. There is increased density within the left retroareolar region with mild nipple retraction and associated periareolar skin thickening. There are a few scattered and grouped indeterminate microcalcifications extending from the palpable mass in the outer mid breast to the retroareolar region. Minimal increased patchy asymmetric density from the left retroareolar region to the palpable abnormality in the outer mid breast. Interval increased size of several lower left axillary lymph nodes. Segmental distribution of loosely associated indeterminate microcalcifications over the outer mid to lower right breast in the middle to posterior third spanning approximally 4.5 cm. Oval partially circumscribed and partially obscured mass over the inner midportion of  the right periareolar region. Targeted ultrasound is performed, showing a large hypoechoic heterogeneous mass with associated microcalcifications over the 3 o'clock position of the left breast 7 cm from the nipple accounting for patient's palpable abnormality. This mass has irregular and indistinct borders measures 2.3 x 3.2 x 3.6 cm. There is a separate adjacent indeterminate hypoechoic mass also at the 3 o'clock position of the left breast 6 cm from the nipple measuring 4 x 7 x  9 mm as this is approximately 1 cm from the palpable mass. There is a mildly prominent duct extending from the left retroareolar region to the palpable mass at the 3 o'clock position suspicious for extension of neoplastic disease. No definite focal mass is seen in the left retroareolar region. There is a small irregular superficial hypoechoic mass over the 10 o'clock position of the left breast 2 cm from the nipple measuring 3 x 5 x 5 mm. Ultrasound the left axilla demonstrates approximately 7 abnormal lymph nodes with the 2 largest abnormal lymph nodes adjacent to each other in the lower axilla. Ultrasound of the right breast demonstrates an indeterminate solid and cystic mass over the 3 o'clock position of the retroareolar region measuring 1 x 1.2 x 1.9 cm. Ultrasound the right axilla is normal. IMPRESSION: 1. Suspicious 3.6 cm mass over the 3 o'clock position of the left breast 7 cm from the nipple accounting for patient's palpable abnormality. Immediately adjacent indeterminate 9 mm mass at 3 o'clock 6 cm from the nipple. Approximately 7 abnormal axillary lymph nodes. 2. Scattered and grouped indeterminate microcalcifications extending from the palpable left breast mass at the 3 o'clock position 2 to the retroareolar region. 3. Indeterminate 5 mm mass over the 10 o'clock position of the left breast 2 cm from the nipple. 4. 4.5 cm group of indeterminate microcalcifications over the outer lower right breast. 5. 1.9 cm indeterminate solid  and cystic mass over the 3 o'clock position of the right retroareolar region. No abnormal right axillary lymph nodes. RECOMMENDATION: 1. Recommend ultrasound-guided core needle biopsy of the 3.6 cm mass at the 3 o'clock position of the left breast 7 cm from the nipple. Also recommend biopsy of 1 of the abnormal lower left axillary lymph nodes. 2. Recommend ultrasound-guided core needle biopsy of the indeterminate superficial 5 mm mass at the 10 o'clock position of the left periareolar region. 3. Recommend ultrasound-guided core biopsy of the indeterminate 1.9 cm mass over the 3 o'clock position of the right retroareolar region. 4. Recommend stereotactic core needle biopsy of the indeterminate microcalcifications over the outer lower right breast. I have discussed the findings and recommendations with the patient. If applicable, a reminder letter will be sent to the patient regarding the next appointment. BI-RADS CATEGORY  5: Highly suggestive of malignancy. Biopsy will be scheduled here by the administrative staff at the [facility]. Electronically Signed   By: Marin Olp M.D.   On: 11/27/2021 16:41   Assessment and plan- Patient is a 76 y.o. female with newly diagnosed invasive mammary carcinoma of the left breast here to discuss further management  Patient has a 3 cm left breast mass along with 7 abnormal lymph nodes noted on mammogram and ultrasound.  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  At this time I will get a PET CT scan as well as MRI brain with and without contrast to complete her staging work-up and obtain baseline tumor markers today.  If PET CT scan shows no evidence of metastatic disease we will also proceed with MRI breasts  bilaterally to see if there is anything more than what needs DIN mammogram.  Given the extent of the disease in her left breast as well as 2 additional masses it is very  likely that she is 1 and need a left mastectomy.  As far as the right breast management is concerned it depends on individual patient preference as well as MRI findings.  Regardless of whether patient has metastatic disease or not given that she essentially has a triple negative breast cancer she would need systemic chemotherapy and we will reach out to Dr. Deniece Ree office for port placement to expedite her treatment.  I will also refer her for genetic counseling.  I will see her after PET CT scan and MRI results are back   Thank you for this kind referral and the opportunity to participate in the care of this patient   Visit Diagnosis 1. Cancer ([place])   2. Invasive carcinoma of breast ([place])     Dr. Randa Evens, MD, MPH [facility] [phone] 12/23/2021

## 2021-12-27 ENCOUNTER — Encounter
Admission: RE | Admit: 2021-12-27 | Discharge: 2021-12-27 | Disposition: A | Discharge status: Home / Self Care | Payer: PPO | Source: Ambulatory Visit | Attending: Oncology | Admitting: Oncology

## 2021-12-27 DIAGNOSIS — Z171 Estrogen receptor negative status [ER-]: Secondary | ICD-10-CM | POA: Insufficient documentation

## 2021-12-27 DIAGNOSIS — C50912 Malignant neoplasm of unspecified site of left female breast: Secondary | ICD-10-CM | POA: Diagnosis not present

## 2021-12-27 DIAGNOSIS — C50412 Malignant neoplasm of upper-outer quadrant of left female breast: Secondary | ICD-10-CM | POA: Diagnosis not present

## 2021-12-27 LAB — GLUCOSE, CAPILLARY: Glucose-Capillary: 85 mg/dL (ref 70–99)

## 2021-12-27 MED ORDER — FLUDEOXYGLUCOSE F - 18 (FDG) INJECTION
5.9400 | Freq: Once | INTRAVENOUS | Status: AC | PRN
  Administered 2021-12-27: 5.94 via INTRAVENOUS

## 2021-12-29 ENCOUNTER — Encounter: Payer: Self-pay | Admitting: Oncology

## 2021-12-29 ENCOUNTER — Inpatient Hospital Stay: Payer: PPO | Admitting: Oncology

## 2021-12-29 VITALS — BP 119/76 | HR 89 | Temp 97.3°F | Resp 16 | Wt 104.9 lb

## 2021-12-29 DIAGNOSIS — Z171 Estrogen receptor negative status [ER-]: Secondary | ICD-10-CM | POA: Diagnosis not present

## 2021-12-29 DIAGNOSIS — C50912 Malignant neoplasm of unspecified site of left female breast: Secondary | ICD-10-CM | POA: Diagnosis not present

## 2021-12-29 DIAGNOSIS — C44519 Basal cell carcinoma of skin of other part of trunk: Secondary | ICD-10-CM | POA: Diagnosis not present

## 2021-12-29 DIAGNOSIS — C50412 Malignant neoplasm of upper-outer quadrant of left female breast: Secondary | ICD-10-CM

## 2021-12-29 DIAGNOSIS — Z4801 Encounter for change or removal of surgical wound dressing: Secondary | ICD-10-CM | POA: Diagnosis not present

## 2021-12-29 DIAGNOSIS — Z48817 Encounter for surgical aftercare following surgery on the skin and subcutaneous tissue: Secondary | ICD-10-CM | POA: Diagnosis not present

## 2021-12-29 DIAGNOSIS — Z7189 Other specified counseling: Secondary | ICD-10-CM

## 2021-12-29 MED ORDER — LORAZEPAM 0.5 MG PO TABS: 0.5000 mg | ORAL_TABLET | Freq: Four times a day (QID) | ORAL | 0 refills | Status: DC | PRN

## 2021-12-29 MED ORDER — LIDOCAINE-PRILOCAINE 2.5-2.5 % EX CREA: TOPICAL_CREAM | CUTANEOUS | 3 refills | Status: DC

## 2021-12-29 MED ORDER — DEXAMETHASONE 4 MG PO TABS: 8.0000 mg | ORAL_TABLET | Freq: Every day | ORAL | 1 refills | Status: DC

## 2021-12-29 MED ORDER — ONDANSETRON HCL 8 MG PO TABS: 8.0000 mg | ORAL_TABLET | Freq: Two times a day (BID) | ORAL | 1 refills | Status: DC | PRN

## 2021-12-29 MED ORDER — PROCHLORPERAZINE MALEATE 10 MG PO TABS: 10.0000 mg | ORAL_TABLET | Freq: Four times a day (QID) | ORAL | 1 refills | Status: DC | PRN

## 2021-12-29 NOTE — Progress Notes (Signed)
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  Showed a irregular mass measuring 2.3 x 3.2 x 3.6 cm.  There were 2 other smaller masses in the left breast measuring 4 x 7 x 9 mm and 3 x 5 x 5 mm.  7 abnormal lymph nodes in the left axilla.  Indeterminate solid and cystic mass in the right breast in the retroareolar region measuring 1 x 1.2 x 1.9 cm as well as another 4.5 cm group of indeterminate microcalcifications in the lower right breast.  Patient had 2 left breast biopsy along with left lymph node biopsy.  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   PET CT scan showed hypermetabolic left breast mass and clusters of small but hypermetabolic left axillary and subpectoral lymph nodes.  1.2 x 0.9 cm subpleural nodule with an SUV of 1.6.  Interval history-patient is doing well presently.  Denies any specific complaints.  She is here with her niece.  She does have some ongoing chronic memory issues  ECOG  PS- 1 Pain scale- 0   Review of systems- Review of Systems  Constitutional:  Negative for chills, fever, malaise/fatigue and weight loss.  HENT:  Negative for congestion, ear discharge and nosebleeds.   Eyes:  Negative for blurred vision.  Respiratory:  Negative for cough, hemoptysis, sputum production, shortness of breath and wheezing.   Cardiovascular:  Negative for chest pain, palpitations, orthopnea and claudication.  Gastrointestinal:  Negative for abdominal pain, blood in stool, constipation, diarrhea, heartburn, melena, nausea and vomiting.  Genitourinary:  Negative for dysuria, flank pain, frequency, hematuria and urgency.  Musculoskeletal:  Negative for back pain, joint pain and myalgias.  Skin:  Negative for rash.  Neurological:  Negative for dizziness, tingling, focal weakness, seizures, weakness and headaches.  Endo/Heme/Allergies:  Does not bruise/bleed easily.  Psychiatric/Behavioral:  Negative for depression and suicidal ideas. The patient does not have insomnia.       No Known Allergies   Past Medical History:  Diagnosis Date   Anxiety    Arthritis    NECK   Cancer (HCC)    BASAL CELL AND MELANOMA   DAVF (dural arteriovenous fistula) 2015   Hypertension      Past Surgical History:  Procedure Laterality Date   BRAIN SURGERY  2015   DURAL AV FISTULA ([facility])   BREAST BIOPSY Left 12/14/2021   [place] Bx 3:00 7 CMFN, Coil Clip, Path Pending   BREAST BIOPSY Left 12/14/2021   [place] Bx 10:00 2 CMFN, Venus Clip, path pending   breast biopsy Left 12/14/2021  [place] Axille, Hydromarker (butterfly). path pending   BREAST BIOPSY Right 12/19/2021   Stereo bx-calcs, "COIL" clip-path pending   BREAST BIOPSY Right 12/19/2021   u/s bx-mass, 3:00, retroareolar, "HEART" clip-path pending   COLONOSCOPY     COLONOSCOPY WITH PROPOFOL N/A 02/20/2017   Procedure: COLONOSCOPY WITH PROPOFOL;  Surgeon: Manya Silvas, MD;  Location: [facility] ENDOSCOPY;  Service: Endoscopy;   Laterality: N/A;   HYSTEROSCOPY WITH D & C N/A 07/29/2015   Procedure: DILATATION AND CURETTAGE /HYSTEROSCOPY;  Surgeon: Benjaman Kindler, MD;  Location: [facility] ORS;  Service: Gynecology;  Laterality: N/A;   HYSTEROSCOPY WITH D & C N/A 01/24/2018   Procedure: DILATATION AND CURETTAGE /HYSTEROSCOPY;  Surgeon: Benjaman Kindler, MD;  Location: [facility] ORS;  Service: Gynecology;  Laterality: N/A;   LAPAROSCOPY N/A 01/24/2018   Procedure: LAPAROSCOPY DIAGNOSTIC;  Surgeon: Benjaman Kindler, MD;  Location: [facility] ORS;  Service: Gynecology;  Laterality: N/A;   PERIPHERAL VASCULAR THROMBECTOMY N/A 02/29/2020   Procedure: PERIPHERAL VASCULAR THROMBECTOMY / THROMBOLYSIS WITH POSSIBLE PULMONARY THROMBECTOMY / THROMBOLYSIS;  Surgeon: Algernon Huxley, MD;  Location: [facility];  Service: Cardiovascular;  Laterality: N/A;   TONSILLECTOMY     AGE 28   TOTAL LAPAROSCOPIC HYSTERECTOMY WITH BILATERAL SALPINGO OOPHORECTOMY Bilateral 12/28/2019   Procedure: TOTAL LAPAROSCOPIC HYSTERECTOMY WITH BILATERAL SALPINGO OOPHORECTOMY;  Surgeon: Benjaman Kindler, MD;  Location: [facility] ORS;  Service: Gynecology;  Laterality: Bilateral;   TUBAL LIGATION      Social History   Socioeconomic History   Marital status: Divorced    Spouse name: Not on file   Number of children: Not on file   Years of education: Not on file   Highest education level: Not on file  Occupational History   Not on file  Tobacco Use   Smoking status: Former    Packs/day: 0.50    Years: 15.00    Total pack years: 7.50    Types: Cigarettes    Quit date: 07/21/2000    Years since quitting: 21.4   Smokeless tobacco: Never  Vaping Use   Vaping Use: Never used  Substance and Sexual Activity   Alcohol use: Yes    Comment: RARE WINE MONTHLY   Drug use: No   Sexual activity: Not Currently  Other Topics Concern   Not on file  Social History Narrative   Not on file   Social Determinants of Health   Financial Resource Strain: Not on file  Food  Insecurity: Not on file  Transportation Needs: Not on file  Physical Activity: Not on file  Stress: Not on file  Social Connections: Not on file  Intimate Partner Violence: Not on file    Family History  Problem Relation Age of Onset   Stomach cancer Sister    Hypertension Sister    Hypertension Sister    Diabetes Niece    Hypertension Niece    Breast cancer Neg Hx      Current Outpatient Medications:    buPROPion (WELLBUTRIN XL) 150 MG 24 hr tablet, Take 150 mg by mouth every morning.  (Patient not taking: Reported on 10/11/2020), Disp: , Rfl: 6   Calcium Carbonate-Vitamin D (OS-CAL 500 + D PO), Take 1 tablet by mouth daily.  (Patient not taking: Reported on 12/22/2021), Disp: , Rfl:    cetirizine (ZYRTEC) 10 MG tablet, Take 10 mg by mouth daily as needed for allergies.  (Patient not taking: Reported on 12/22/2021), Disp: , Rfl:    Cholecalciferol (DIALYVITE VITAMIN D 5000) 125 MCG (5000 UT)  capsule, Take 5,000 Units by mouth daily. (Patient not taking: Reported on 12/22/2021), Disp: , Rfl:    docusate sodium (COLACE) 100 MG capsule, Take 1 capsule (100 mg total) by mouth 2 (two) times daily. To keep stools soft (Patient not taking: Reported on 02/29/2020), Disp: 30 capsule, Rfl: 0   estrogen, conjugated,-medroxyprogesterone (PREMPRO) 0.625-5 MG tablet, Take 1 tablet by mouth every evening.  (Patient not taking: Reported on 12/22/2021), Disp: , Rfl:    Lutein 40 MG CAPS, Take 40 mg by mouth daily. (Patient not taking: Reported on 12/22/2021), Disp: , Rfl:    oxyCODONE (OXY IR/ROXICODONE) 5 MG immediate release tablet, Take 1 tablet (5 mg total) by mouth every 4 (four) hours as needed for severe pain. (Patient not taking: Reported on 02/29/2020), Disp: 20 tablet, Rfl: 0   rivaroxaban (XARELTO) 20 MG TABS tablet, 1 tablet (20 mg total) daily with breakfast (Patient not taking: Reported on 10/11/2020), Disp: , Rfl:    vitamin B-12 (CYANOCOBALAMIN) 1000 MCG tablet, Take 1,000 mcg by mouth daily.   (Patient not taking: Reported on 12/22/2021), Disp: , Rfl:    XARELTO 20 MG TABS tablet, TAKE 1 TABLET BY MOUTH EVERY DAY (Patient not taking: Reported on 10/11/2020), Disp: 90 tablet, Rfl: 1  Physical exam:  Vitals:   12/29/21 1430  BP: 119/76  Pulse: 89  Resp: 16  Temp: (!) 97.3 F (36.3 C)  SpO2: 98%  Weight: 104 lb 14.4 oz (47.6 kg)   Physical Exam Cardiovascular:     Rate and Rhythm: Normal rate and regular rhythm.     Heart sounds: Normal heart sounds.  Pulmonary:     Effort: Pulmonary effort is normal.  Skin:    General: Skin is warm and dry.  Neurological:     Mental Status: She is alert and oriented to person, place, and time.         Latest Ref Rng & Units 12/22/2021   11:55 AM  CMP  Glucose 70 - 99 mg/dL 85   BUN 8 - 23 mg/dL 14   Creatinine 0.44 - 1.00 mg/dL 0.79   Sodium 135 - 145 mmol/L 139   Potassium 3.5 - 5.1 mmol/L 4.3   Chloride 98 - 111 mmol/L 107   CO2 22 - 32 mmol/L 26   Calcium 8.9 - 10.3 mg/dL 8.9   Total Protein 6.5 - 8.1 g/dL 7.9   Total Bilirubin 0.3 - 1.2 mg/dL 1.1   Alkaline Phos 38 - 126 U/L 76   AST 15 - 41 U/L 26   ALT 0 - 44 U/L 15       Latest Ref Rng & Units 12/22/2021   11:55 AM  CBC  WBC 4.0 - 10.5 K/uL 5.7   Hemoglobin 12.0 - 15.0 g/dL 16.1   Hematocrit 36.0 - 46.0 % 49.0   Platelets 150 - 400 K/uL 226     No images are attached to the encounter.  NM PET Image Initial (PI) Skull Base To Thigh  Result Date: 12/28/2021 CLINICAL DATA:  Initial treatment strategy for left breast cancer. EXAM: NUCLEAR MEDICINE PET SKULL BASE TO THIGH TECHNIQUE: 5.9 mCi F-18 FDG was injected intravenously. Full-ring PET imaging was performed from the skull base to thigh after the radiotracer. CT data was obtained and used for attenuation correction and anatomic localization. Fasting blood glucose: 85 mg/dl COMPARISON:  CT examinations from 10/20/2020 and 02/28/2020 FINDINGS: Mediastinal blood pool activity: SUV max 1.6 Liver activity: SUV max NA  NECK: No significant abnormal  hypermetabolic activity in this region. Incidental CT findings: none CHEST: 3.0 cm left lateral breast mass, maximum SUV 9.3, compatible with malignancy. Clustered small left axillary lymph nodes, maximum SUV 3.2, substantially above blood pool and suspicious for malignancy. Individual nodes in this cluster measure up to about 0.7 cm in diameter. There also small but faintly hypermetabolic subpectoral lymph nodes with maximum SUV to 1.8. A 1.2 by 0.9 cm right lower lobe subpleural nodule on image 93 series 2 has a maximum SUV of 1.6. This lesion measured 1.2 by 1.1 cm on 07/22/2020 and there is an indistinct airspace opacity in this region on 02/28/2020. Incidental CT findings: Atherosclerotic calcification the aortic arch and branch vessels. ABDOMEN/PELVIS: No significant abnormal hypermetabolic activity in this region. Incidental CT findings: Atherosclerosis is present, including aortoiliac atherosclerotic disease. IVC filter noted. Sigmoid colon diverticulosis. SKELETON: No significant abnormal hypermetabolic activity in this region. Incidental CT findings: Lower cervical spondylosis IMPRESSION: 1. Hypermetabolic left breast mass compatible with malignancy. Clustered small but hypermetabolic left axillary and subpectoral lymph nodes are likely involved. 2. 1.2 by 0.9 cm right lower lobe subpleural nodule is maximum SUV of 1.6. Possibilities include chronic inflammation or low-grade adenocarcinoma. 3. Other imaging findings of potential clinical significance: Aortic Atherosclerosis (ICD10-I70.0). Sigmoid colon diverticulosis. Lower cervical spondylosis. Electronically Signed   By: Van Clines M.D.   On: 12/28/2021 15:40   [place] RT BREAST BX W LOC DEV 1ST LESION IMG BX SPEC US GUIDE  Addendum Date: 12/21/2021   ADDENDUM REPORT: 12/21/2021 14:03 ADDENDUM: PATHOLOGY revealed: Site A. BREAST MASS, RIGHT RETROAREOLAR, 3:00 o'clock. 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 Comment: The differential diagnosis for the findings in part A includes atypical ductal hyperplasia (ADH) involving an intraductal papilloma with sclerosis. Correlation with radiographic findings is required. Pathology results are CONCORDANT with imaging findings, per Dr. Audie Pinto with excision recommended. PATHOLOGY revealed: Site B. BREAST CALCIFICATIONS, RIGHT LOWER OUTER; STEREOTACTIC BIOPSY: - POLARIZABLE CALCIUM OXALATE. - CALCIFICATIONS ASSOCIATED WITH COLUMNAR CELL CHANGE. - DUCT ECTASIA, APOCRINE METAPLASIA, AND SCLEROSING ADENOSIS. - NEGATIVE FOR ATYPIA AND MALIGNANCY. Pathology results are CONCORDANT with imaging findings, per Dr. Audie Pinto. Pathology results and recommendations below were discussed with patient's niece and POA Maximiano Coss) by Electa Sniff RN via telephone on 12/20/2021. Margaretha Sheffield reported biopsy site within normal limits with no adverse symptoms. RECOMMENDATIONS: 1. Continue with current treatment plan for newly diagnosed LEFT breast cancer. 2. Surgical consultation for Site A - RIGHT breast. Surgical consultation relayed to Casper Harrison RN at [facility] by Electa Sniff RN on 12/20/2021. Patient met with surgeon (Dr. Herbert Pun) on 12/20/2021 per niece Maximiano Coss) report. 3. If breast conversation desired, six month follow-up mammogram on site B to ensure stability of biopsied area. Pathology results reported by Electa Sniff RN on 12/21/2021. Electronically Signed   By: Audie Pinto M.D.   On: 12/21/2021 14:03   Result Date: 12/21/2021 CLINICAL DATA:  76 year old female presenting for biopsies of the right breast. EXAM: ULTRASOUND GUIDED RIGHT BREAST CORE NEEDLE BIOPSY COMPARISON:  Previous exam(s). PROCEDURE: I met with the patient and we discussed the procedure of ultrasound-guided biopsy, including benefits and alternatives. We discussed the high likelihood of a successful procedure. We discussed  the risks of the procedure, including infection, bleeding, tissue injury, clip migration, and inadequate sampling. Informed written consent was given. The usual time-out protocol was performed immediately prior to the procedure. Lesion quadrant: Lower inner quadrant Using sterile technique and 1% Lidocaine as local  anesthetic, under direct ultrasound visualization, a 14 gauge spring-loaded device was used to perform biopsy of a mass in the right breast at 3 o'clock retroareolar using a medial approach. At the conclusion of the procedure a heart shaped tissue marker clip was deployed into the biopsy cavity. Follow up 2 view mammogram was performed and dictated separately. IMPRESSION: Ultrasound guided biopsy of a mass in the right breast at 3 o'clock retroareolar. No apparent complications. Electronically Signed: By: Audie Pinto M.D. On: 12/19/2021 09:32   MM RT BREAST BX W LOC DEV 1ST LESION IMAGE BX SPEC STEREO GUIDE  Addendum Date: 12/21/2021   ADDENDUM REPORT: 12/21/2021 14:03 ADDENDUM: PATHOLOGY revealed: Site A. BREAST MASS, RIGHT RETROAREOLAR, 3:00 o'clock. 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 Comment: The differential diagnosis for the findings in part A includes atypical ductal hyperplasia (ADH) involving an intraductal papilloma with sclerosis. Correlation with radiographic findings is required. Pathology results are CONCORDANT with imaging findings, per Dr. Audie Pinto with excision recommended. PATHOLOGY revealed: Site B. BREAST CALCIFICATIONS, RIGHT LOWER OUTER; STEREOTACTIC BIOPSY: - POLARIZABLE CALCIUM OXALATE. - CALCIFICATIONS ASSOCIATED WITH COLUMNAR CELL CHANGE. - DUCT ECTASIA, APOCRINE METAPLASIA, AND SCLEROSING ADENOSIS. - NEGATIVE FOR ATYPIA AND MALIGNANCY. Pathology results are CONCORDANT with imaging findings, per Dr. Audie Pinto. Pathology results and recommendations below were discussed with patient's niece and POA  Maximiano Coss) by Electa Sniff RN via telephone on 12/20/2021. Margaretha Sheffield reported biopsy site within normal limits with no adverse symptoms. RECOMMENDATIONS: 1. Continue with current treatment plan for newly diagnosed LEFT breast cancer. 2. Surgical consultation for Site A - RIGHT breast. Surgical consultation relayed to Casper Harrison RN at [facility] by Electa Sniff RN on 12/20/2021. Patient met with surgeon (Dr. Herbert Pun) on 12/20/2021 per niece Maximiano Coss) report. 3. If breast conversation desired, six month follow-up mammogram on site B to ensure stability of biopsied area. Pathology results reported by Electa Sniff RN on 12/21/2021. Electronically Signed   By: Audie Pinto M.D.   On: 12/21/2021 14:03   Result Date: 12/21/2021 CLINICAL DATA:  76 year old female presenting for right breast biopsies. EXAM: RIGHT BREAST STEREOTACTIC CORE NEEDLE BIOPSY COMPARISON:  Previous exam(s). FINDINGS: The patient and I discussed the procedure of stereotactic-guided biopsy including benefits and alternatives. We discussed the high likelihood of a successful procedure. We discussed the risks of the procedure including infection, bleeding, tissue injury, clip migration, and inadequate sampling. Informed written consent was given. The usual time out protocol was performed immediately prior to the procedure. Using sterile technique and 1% Lidocaine as local anesthetic, under stereotactic guidance, a 9 gauge vacuum assisted device was used to perform core needle biopsy of calcifications in the lower outer right breast using a lateral approach. Specimen radiograph was performed showing at least 3 specimens with calcifications. Specimens with calcifications are identified for pathology. Lesion quadrant: Lower outer quadrant At the conclusion of the procedure, a coil shaped tissue marker clip was deployed into the biopsy cavity. Follow-up 2-view mammogram was performed and dictated separately. IMPRESSION:  Stereotactic-guided biopsy of calcifications in the lower outer right breast. No apparent complications. Electronically Signed: By: Audie Pinto M.D. On: 12/19/2021 09:33   MM CLIP PLACEMENT RIGHT  Result Date: 12/19/2021 CLINICAL DATA:  Post procedure mammogram for clip placement EXAM: 3D DIAGNOSTIC LEFT MAMMOGRAM POST ULTRASOUND AND STEREOTACTIC BIOPSY COMPARISON:  Previous exam(s). FINDINGS: 3D Mammographic images were obtained following ultrasound guided biopsy of a mass in the right breast at 3 o'clock retroareolar. The heart  biopsy marking clip is in expected position at the site of biopsy. 3D Mammographic images were obtained following stereotactic guided biopsy of calcifications in the lower outer right breast. The coil shaped biopsy marking clip appears displaced medially by approximately 3.7 cm. There are residual calcifications. IMPRESSION: Appropriate positioning of the heart shaped biopsy marking clip at the site of biopsy in the right breast at 3 o'clock retroareolar. The coil shaped biopsy marking clip appears displaced medially by approximately 3.7 cm. There are residual calcifications that could be used for localization if needed. Final Assessment: Post Procedure Mammograms for Marker Placement Electronically Signed   By: Audie Pinto M.D.   On: 12/19/2021 09:26  [place] AXILLARY NODE CORE BIOPSY LEFT  Addendum Date: 12/15/2021   ADDENDUM REPORT: 12/15/2021 16:29 ADDENDUM: PATHOLOGY revealed: Site A. 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 - NEGATIVE FOR ATYPICAL PROLIFERATIVE BREAST DISEASE. Pathology results are CONCORDANT with imaging findings, per Dr. Hassan Rowan with excision recommended. PATHOLOGY revealed: Site B. BREAST, LEFT AT 3:00, 7 CM FROM THE NIPPLE; ULTRASOUND-GUIDED CORE NEEDLE BIOPSY: - INVASIVE MAMMARY CARCINOMA, WITH SQUAMOUS METAPLASIA AND  KERATINIZATION. Size of invasive carcinoma: 10 mm in this sample. Grade 3. Ductal carcinoma in situ: Present, high-grade with comedonecrosis. Lymphovascular invasion: Not identified. 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 Definitive classification is dependent upon final excision. Pathology results are CONCORDANT with imaging findings, per Dr. Hassan Rowan. PATHOLOGY revealed: Site C. LYMPH NODE, LEFT AXILLARY; ULTRASOUND-GUIDED CORE NEEDLE BIOPSY: - POSITIVE FOR MALIGNANCY. 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 Pathology results are CONCORDANT with imaging findings, per Dr. Hassan Rowan. Pathology results and recommendations below were discussed with patient and patient's POA Maximiano Coss) by telephone on 12/15/2021. Patient reported biopsy site within normal limits with slight tenderness at the site. Post biopsy care instructions were reviewed, questions were answered and my direct phone number was provided to patient. Patient was instructed to call [facility] if any concerns or questions arise related to the biopsy. RECOMMENDATIONS: 1. Surgical and oncological consultation. Request for surgical and oncological consultation relayed to Casper Harrison RN at [facility] by Electa Sniff RN on 12/15/2021. 2. Patient has RIGHT breast stereotactic guided biopsy scheduled for 12/19/2021. Pathology results reported by Electa Sniff RN on 12/15/2021. Electronically Signed   By: Margarette Canada M.D.   On: 12/15/2021 16:29   Result Date: 12/15/2021 CLINICAL DATA:  76 year old female for tissue sampling of a 0.5 cm UPPER INNER LEFT breast mass, a 3.6 cm OUTER LEFT breast mass, and an enlarged LEFT axillary lymph node. EXAM: ULTRASOUND GUIDED LEFT BREAST CORE NEEDLE BIOPSY X 2 [place] AXILLARY NODE CORE BIOPSY LEFT COMPARISON:  None Available. PROCEDURE: I met with the patient and we discussed the procedure of  ultrasound-guided biopsy, including benefits and alternatives. We discussed the high likelihood of a successful procedure. We discussed the risks of the procedure, including infection, bleeding, tissue injury, clip migration, and inadequate sampling. Informed written consent was given. The usual time-out protocol was performed immediately prior to the procedure. ULTRASOUND GUIDED LEFT BREAST CORE NEEDLE BIOPSY #1 (0.5 cm UPPER INNER LEFT breast mass-VENUS clip): Using sterile technique and 1% Lidocaine as local anesthetic, under direct ultrasound visualization, a 12 gauge spring-loaded device was used to perform biopsy of the 0.5 cm mass at the 10 o'clock position of the LEFT  breast using a MEDIAL approach. At the conclusion of the procedure a VENUS tissue marker clip was deployed into the biopsy cavity. Follow up 2 view mammogram was performed and dictated separately. ULTRASOUND GUIDED LEFT BREAST CORE NEEDLE BIOPSY #2 (3.6 cm OUTER LEFT breast mass-COIL clip): Using sterile technique and 1% Lidocaine as local anesthetic, under direct ultrasound visualization, a 12 gauge spring-loaded device was used to perform biopsy of the 3.6 cm mass at the 3 o'clock position of the LEFT breast using a superomedial approach. At the conclusion of the procedure a COIL shaped tissue marker clip was deployed into the biopsy cavity. Follow up 2 view mammogram was performed and dictated separately. [place] AXILLARY NODE CORE BIOPSY LEFT Using sterile technique and 1% Lidocaine as local anesthetic, under direct ultrasound visualization, a 12 gauge spring-loaded device was used to perform biopsy of an abnormal appearing lymph node in the LEFT axilla using a MEDIAL approach. The lowest abnormal LEFT axillary lymph node was not targeted due to an overlying artery. At the conclusion of the procedure a HydroMARK tissue marker clip was deployed into the biopsy cavity. Follow up 2 view mammogram was performed and dictated separately. IMPRESSION:  Ultrasound guided biopsy of 0.5 cm UPPER INNER LEFT breast mass (VENUS clip). Ultrasound guided biopsy of 3.6 cm OUTER LEFT breast mass (RIBBON clip). Ultrasound-guided biopsy of an abnormal LEFT axillary lymph node. No apparent complications. Electronically Signed: By: Margarette Canada M.D. On: 12/14/2021 11:17   [place] LT BREAST BX W LOC DEV 1ST LESION IMG BX SPEC US GUIDE  Addendum Date: 12/15/2021   ADDENDUM REPORT: 12/15/2021 16:29 ADDENDUM: PATHOLOGY revealed: Site A. 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 - NEGATIVE FOR ATYPICAL PROLIFERATIVE BREAST DISEASE. Pathology results are CONCORDANT with imaging findings, per Dr. Hassan Rowan with excision recommended. PATHOLOGY revealed: Site B. BREAST, LEFT AT 3:00, 7 CM FROM THE NIPPLE; ULTRASOUND-GUIDED CORE NEEDLE BIOPSY: - INVASIVE MAMMARY CARCINOMA, WITH SQUAMOUS METAPLASIA AND KERATINIZATION. Size of invasive carcinoma: 10 mm in this sample. Grade 3. Ductal carcinoma in situ: Present, high-grade with comedonecrosis. Lymphovascular invasion: Not identified. 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 Definitive classification is dependent upon final excision. Pathology results are CONCORDANT with imaging findings, per Dr. Hassan Rowan. PATHOLOGY revealed: Site C. LYMPH NODE, LEFT AXILLARY; ULTRASOUND-GUIDED CORE NEEDLE BIOPSY: - POSITIVE FOR MALIGNANCY. 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 Pathology results are CONCORDANT with imaging findings, per Dr. Hassan Rowan. Pathology results and recommendations below were discussed with patient and patient's POA Maximiano Coss) by telephone on 12/15/2021. Patient reported biopsy site within normal limits with slight tenderness at the site. Post biopsy care instructions were reviewed, questions were answered and my direct  phone number was provided to patient. Patient was instructed to call [facility] if any concerns or questions arise related to the biopsy. RECOMMENDATIONS: 1. Surgical and oncological consultation. Request for surgical and oncological consultation relayed to Casper Harrison RN at [facility] by Electa Sniff RN on 12/15/2021. 2. Patient has RIGHT breast stereotactic guided biopsy scheduled for 12/19/2021. Pathology results reported by Electa Sniff RN on 12/15/2021. Electronically Signed   By: Margarette Canada M.D.   On: 12/15/2021 16:29   Result Date: 12/15/2021 CLINICAL DATA:  76 year old female for tissue sampling of a 0.5 cm UPPER INNER LEFT breast mass, a 3.6 cm OUTER  LEFT breast mass, and an enlarged LEFT axillary lymph node. EXAM: ULTRASOUND GUIDED LEFT BREAST CORE NEEDLE BIOPSY X 2 [place] AXILLARY NODE CORE BIOPSY LEFT COMPARISON:  None Available. PROCEDURE: I met with the patient and we discussed the procedure of ultrasound-guided biopsy, including benefits and alternatives. We discussed the high likelihood of a successful procedure. We discussed the risks of the procedure, including infection, bleeding, tissue injury, clip migration, and inadequate sampling. Informed written consent was given. The usual time-out protocol was performed immediately prior to the procedure. ULTRASOUND GUIDED LEFT BREAST CORE NEEDLE BIOPSY #1 (0.5 cm UPPER INNER LEFT breast mass-VENUS clip): Using sterile technique and 1% Lidocaine as local anesthetic, under direct ultrasound visualization, a 12 gauge spring-loaded device was used to perform biopsy of the 0.5 cm mass at the 10 o'clock position of the LEFT breast using a MEDIAL approach. At the conclusion of the procedure a VENUS tissue marker clip was deployed into the biopsy cavity. Follow up 2 view mammogram was performed and dictated separately. ULTRASOUND GUIDED LEFT BREAST CORE NEEDLE BIOPSY #2 (3.6 cm OUTER LEFT breast mass-COIL clip): Using sterile technique and 1%  Lidocaine as local anesthetic, under direct ultrasound visualization, a 12 gauge spring-loaded device was used to perform biopsy of the 3.6 cm mass at the 3 o'clock position of the LEFT breast using a superomedial approach. At the conclusion of the procedure a COIL shaped tissue marker clip was deployed into the biopsy cavity. Follow up 2 view mammogram was performed and dictated separately. [place] AXILLARY NODE CORE BIOPSY LEFT Using sterile technique and 1% Lidocaine as local anesthetic, under direct ultrasound visualization, a 12 gauge spring-loaded device was used to perform biopsy of an abnormal appearing lymph node in the LEFT axilla using a MEDIAL approach. The lowest abnormal LEFT axillary lymph node was not targeted due to an overlying artery. At the conclusion of the procedure a HydroMARK tissue marker clip was deployed into the biopsy cavity. Follow up 2 view mammogram was performed and dictated separately. IMPRESSION: Ultrasound guided biopsy of 0.5 cm UPPER INNER LEFT breast mass (VENUS clip). Ultrasound guided biopsy of 3.6 cm OUTER LEFT breast mass (RIBBON clip). Ultrasound-guided biopsy of an abnormal LEFT axillary lymph node. No apparent complications. Electronically Signed: By: Margarette Canada M.D. On: 12/14/2021 11:17   [place] LT BREAST BX W LOC DEV EA ADD LESION IMG BX SPEC US GUIDE  Addendum Date: 12/15/2021   ADDENDUM REPORT: 12/15/2021 16:29 ADDENDUM: PATHOLOGY revealed: Site A. 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 - NEGATIVE FOR ATYPICAL PROLIFERATIVE BREAST DISEASE. Pathology results are CONCORDANT with imaging findings, per Dr. Hassan Rowan with excision recommended. PATHOLOGY revealed: Site B. BREAST, LEFT AT 3:00, 7 CM FROM THE NIPPLE; ULTRASOUND-GUIDED CORE NEEDLE BIOPSY: - INVASIVE MAMMARY CARCINOMA, WITH SQUAMOUS METAPLASIA AND KERATINIZATION. Size of invasive carcinoma: 10 mm  in this sample. Grade 3. Ductal carcinoma in situ: Present, high-grade with comedonecrosis. Lymphovascular invasion: Not identified. 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 Definitive classification is dependent upon final excision. Pathology results are CONCORDANT with imaging findings, per Dr. Hassan Rowan. PATHOLOGY revealed: Site C. LYMPH NODE, LEFT AXILLARY; ULTRASOUND-GUIDED CORE NEEDLE BIOPSY: - POSITIVE FOR MALIGNANCY. 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 Pathology results are CONCORDANT with imaging findings, per Dr. Hassan Rowan. Pathology results and recommendations below were discussed with patient and patient's POA (  Maximiano Coss) by telephone on 12/15/2021. Patient reported biopsy site within normal limits with slight tenderness at the site. Post biopsy care instructions were reviewed, questions were answered and my direct phone number was provided to patient. Patient was instructed to call [facility] if any concerns or questions arise related to the biopsy. RECOMMENDATIONS: 1. Surgical and oncological consultation. Request for surgical and oncological consultation relayed to Casper Harrison RN at [facility] by Electa Sniff RN on 12/15/2021. 2. Patient has RIGHT breast stereotactic guided biopsy scheduled for 12/19/2021. Pathology results reported by Electa Sniff RN on 12/15/2021. Electronically Signed   By: Margarette Canada M.D.   On: 12/15/2021 16:29   Result Date: 12/15/2021 CLINICAL DATA:  76 year old female for tissue sampling of a 0.5 cm UPPER INNER LEFT breast mass, a 3.6 cm OUTER LEFT breast mass, and an enlarged LEFT axillary lymph node. EXAM: ULTRASOUND GUIDED LEFT BREAST CORE NEEDLE BIOPSY X 2 [place] AXILLARY NODE CORE BIOPSY LEFT COMPARISON:  None Available. PROCEDURE: I met with the patient and we discussed the procedure of ultrasound-guided biopsy, including benefits and alternatives.  We discussed the high likelihood of a successful procedure. We discussed the risks of the procedure, including infection, bleeding, tissue injury, clip migration, and inadequate sampling. Informed written consent was given. The usual time-out protocol was performed immediately prior to the procedure. ULTRASOUND GUIDED LEFT BREAST CORE NEEDLE BIOPSY #1 (0.5 cm UPPER INNER LEFT breast mass-VENUS clip): Using sterile technique and 1% Lidocaine as local anesthetic, under direct ultrasound visualization, a 12 gauge spring-loaded device was used to perform biopsy of the 0.5 cm mass at the 10 o'clock position of the LEFT breast using a MEDIAL approach. At the conclusion of the procedure a VENUS tissue marker clip was deployed into the biopsy cavity. Follow up 2 view mammogram was performed and dictated separately. ULTRASOUND GUIDED LEFT BREAST CORE NEEDLE BIOPSY #2 (3.6 cm OUTER LEFT breast mass-COIL clip): Using sterile technique and 1% Lidocaine as local anesthetic, under direct ultrasound visualization, a 12 gauge spring-loaded device was used to perform biopsy of the 3.6 cm mass at the 3 o'clock position of the LEFT breast using a superomedial approach. At the conclusion of the procedure a COIL shaped tissue marker clip was deployed into the biopsy cavity. Follow up 2 view mammogram was performed and dictated separately. [place] AXILLARY NODE CORE BIOPSY LEFT Using sterile technique and 1% Lidocaine as local anesthetic, under direct ultrasound visualization, a 12 gauge spring-loaded device was used to perform biopsy of an abnormal appearing lymph node in the LEFT axilla using a MEDIAL approach. The lowest abnormal LEFT axillary lymph node was not targeted due to an overlying artery. At the conclusion of the procedure a HydroMARK tissue marker clip was deployed into the biopsy cavity. Follow up 2 view mammogram was performed and dictated separately. IMPRESSION: Ultrasound guided biopsy of 0.5 cm UPPER INNER LEFT breast mass  (VENUS clip). Ultrasound guided biopsy of 3.6 cm OUTER LEFT breast mass (RIBBON clip). Ultrasound-guided biopsy of an abnormal LEFT axillary lymph node. No apparent complications. Electronically Signed: By: Margarette Canada M.D. On: 12/14/2021 11:17   MM CLIP PLACEMENT LEFT  Result Date: 12/14/2021 CLINICAL DATA:  Evaluate placement of VENUS, COIL and HydroMARK biopsy clips following ultrasound-guided LEFT breast and axillary biopsies. EXAM: 3D DIAGNOSTIC LEFT MAMMOGRAM POST ULTRASOUND BIOPSY COMPARISON:  Previous exam(s). FINDINGS: 3D Mammographic images of the LEFT breast were obtained following ultrasound guided biopsy of the 0.5 cm mass at the 10 o'clock  position (VENUS clip), the 3.6 cm mass at the 3 o'clock position (COIL clip) and an abnormal LEFT axillary lymph node (HydroMARK clip). The VENUS biopsy marking clip is in expected position at the site of biopsy. The COIL biopsy marking clip is in expected position at the site of biopsy. The [facility] clip is in expected position at the site of biopsy. IMPRESSION: Appropriate positioning of the VENUS shaped biopsy marking clip at the site of biopsy in the UPPER INNER LEFT breast. Appropriate positioning of the COIL shaped biopsy marking clip at the site of biopsy in the OUTER LEFT breast. Appropriate positioning of the [facility] biopsy marking clip at the site of biopsy in the LEFT axilla. Final Assessment: Post Procedure Mammograms for Marker Placement Electronically Signed   By: Margarette Canada M.D.   On: 12/14/2021 11:37    Assessment and plan- Patient is a 76 y.o. 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  There was a  subpleural nodule 1.2 cm with a small SUV uptake of 1.6.  I will plan to keep an eye on this in the future.  If there is continued growth of this nodule we will consider a biopsy down the line.  As such I will be treating her with a curative intent for stage III triple negative breast cancer although she is PR weakly positive.  Patient is doing well for her age overall and her main issue is her cognitive decline more than other medical comorbidities.  No baseline cardiac issues.  I would therefore recommend neoadjuvant chemotherapy as per keynote 522 regimen.  I would recommend doxorubicin at 60 mg per metered square along with cyclophosphamide 600 mg per metered square and pembrolizumab 200 mg IV every 3 weeks for 4 cycles.Following this I will obtain an interim ultrasound  to see how she is responding to chemotherapy.  If patient continues to respond well clinically and on the basis of ultrasound she will proceed with Carboplatin at AUC 5 given IV every 3 weeks for 4 cycles along with weekly Taxol at 80 mg per metered square for 12 cycles plus pembrolizumab 200 mg IV 3 weeks for 4 cycles.  Using anthracycline-based chemotherapy first given the carboplatin shortage.  Discussed risks and benefits of chemotherapy including all but not limited to nausea, vomiting, low blood counts, risk of infections and hospitalizations.  Risk of peripheral neuropathy, hair loss and infusion reaction associated with carboplatin and Taxol.  Discussed the risk of cardiotoxicity associated with doxorubicin and the need for getting an echocardiogram prior to starting doxorubicin.  Discussed the risks of immunotherapy including all but not limited to autoimmune side effect such as colitis pneumonitis thyroiditis endocrinopathies as well as abnormal kidney and liver functions.  Treatment will be given with a curative intent.Keynote 522 trial showed that patients who received pembrolizumab along with standard chemotherapy at a higher  rate of pathological complete response 64.8% versus 51.2% in the placebo group.  At a median follow-up of 15.5 months 7.4% in the pembrolizumab group had disease progression as compared to 11.8% in the placebo group.  Patient will need port placement, chemo teach, echocardiogram.  She already has an MRI breast and MRI brain scheduled for next week.  I will tentatively see her back in 10 days to start cycle 1 of AC Keytruda chemotherapy   Cancer Staging  Malignant neoplasm of upper-outer quadrant of left breast in female, estrogen receptor negative ([place]) Staging form: Breast, AJCC 8th Edition - Clinical stage from 12/29/2021: Stage IIIA (cT2, cN1(f), cM0, G3, ER-, PR+, HER2-) - Signed by Sindy Guadeloupe, MD on 12/29/2021 Method of lymph node assessment: Core biopsy Histologic grading system: 3 grade system     Visit Diagnosis 1. Malignant neoplasm of upper-outer quadrant of left breast in female, estrogen receptor negative ([place])   2. Goals of care, counseling/discussion      Dr. Randa Evens, MD, MPH [facility] [phone] 12/29/2021 2:39 PM

## 2021-12-29 NOTE — Progress Notes (Signed)
START ON PATHWAY REGIMEN - Breast     Cycles 1 through 4: A cycle is every 21 days:     Pembrolizumab      Paclitaxel      Carboplatin      Filgrastim-xxxx    Cycles 5 through 8: A cycle is every 21 days:     Pembrolizumab      Doxorubicin      Cyclophosphamide      Pegfilgrastim-xxxx   **Always confirm dose/schedule in your pharmacy ordering system**  Patient Characteristics: Preoperative or Nonsurgical Candidate (Clinical Staging), Neoadjuvant Therapy followed by Surgery, Invasive Disease, Chemotherapy, HER2 Negative/Unknown/Equivocal, ER Negative/Unknown, Platinum Therapy Indicated and Candidate for Checkpoint Inhibitor Therapeutic Status: Preoperative or Nonsurgical Candidate (Clinical Staging) AJCC M Category: cM0 AJCC Grade: G3 Breast Surgical Plan: Neoadjuvant Therapy followed by Surgery ER Status: Negative (-) AJCC 8 Stage Grouping: IIIA HER2 Status: Negative (-) AJCC T Category: cT2 AJCC N Category: cN1 PR Status: Positive (+) Intent of Therapy: Curative Intent, Discussed with Patient

## 2021-12-31 ENCOUNTER — Encounter: Payer: Self-pay | Admitting: Oncology

## 2022-01-01 ENCOUNTER — Other Ambulatory Visit: Payer: Self-pay

## 2022-01-01 ENCOUNTER — Telehealth: Payer: Self-pay

## 2022-01-01 ENCOUNTER — Other Ambulatory Visit: Payer: PPO

## 2022-01-01 ENCOUNTER — Ambulatory Visit: Payer: PPO

## 2022-01-01 NOTE — Telephone Encounter (Signed)
Both PA for lidocaine cream (EMLA) and ondesatron has been sent via CoverMyMeds on 01/01/2022

## 2022-01-02 ENCOUNTER — Ambulatory Visit
Admission: RE | Admit: 2022-01-02 | Discharge: 2022-01-02 | Disposition: A | Discharge status: Home / Self Care | Payer: PPO | Source: Ambulatory Visit | Attending: Oncology | Admitting: Oncology

## 2022-01-02 DIAGNOSIS — C50412 Malignant neoplasm of upper-outer quadrant of left female breast: Secondary | ICD-10-CM | POA: Diagnosis not present

## 2022-01-02 DIAGNOSIS — I10 Essential (primary) hypertension: Secondary | ICD-10-CM | POA: Insufficient documentation

## 2022-01-02 DIAGNOSIS — I340 Nonrheumatic mitral (valve) insufficiency: Secondary | ICD-10-CM | POA: Insufficient documentation

## 2022-01-02 DIAGNOSIS — Z01818 Encounter for other preprocedural examination: Secondary | ICD-10-CM | POA: Insufficient documentation

## 2022-01-02 DIAGNOSIS — R928 Other abnormal and inconclusive findings on diagnostic imaging of breast: Secondary | ICD-10-CM | POA: Diagnosis not present

## 2022-01-02 DIAGNOSIS — I6789 Other cerebrovascular disease: Secondary | ICD-10-CM | POA: Insufficient documentation

## 2022-01-02 DIAGNOSIS — Z171 Estrogen receptor negative status [ER-]: Secondary | ICD-10-CM | POA: Insufficient documentation

## 2022-01-02 MED ORDER — GADOBUTROL 1 MMOL/ML IV SOLN
5.0000 mL | Freq: Once | INTRAVENOUS | Status: AC | PRN
  Administered 2022-01-02: 5 mL via INTRAVENOUS

## 2022-01-03 ENCOUNTER — Ambulatory Visit (HOSPITAL_BASED_OUTPATIENT_CLINIC_OR_DEPARTMENT_OTHER)
Admission: RE | Admit: 2022-01-03 | Discharge: 2022-01-03 | Disposition: A | Discharge status: Home / Self Care | Payer: PPO | Source: Ambulatory Visit | Attending: Oncology | Admitting: Oncology

## 2022-01-03 ENCOUNTER — Ambulatory Visit
Admission: RE | Admit: 2022-01-03 | Discharge: 2022-01-03 | Disposition: A | Discharge status: Home / Self Care | Payer: PPO | Source: Ambulatory Visit | Attending: Oncology | Admitting: Oncology

## 2022-01-03 DIAGNOSIS — Z01818 Encounter for other preprocedural examination: Secondary | ICD-10-CM | POA: Diagnosis not present

## 2022-01-03 DIAGNOSIS — Z0189 Encounter for other specified special examinations: Secondary | ICD-10-CM | POA: Diagnosis not present

## 2022-01-03 DIAGNOSIS — I739 Peripheral vascular disease, unspecified: Secondary | ICD-10-CM | POA: Diagnosis not present

## 2022-01-03 DIAGNOSIS — I619 Nontraumatic intracerebral hemorrhage, unspecified: Secondary | ICD-10-CM | POA: Diagnosis not present

## 2022-01-03 DIAGNOSIS — C50412 Malignant neoplasm of upper-outer quadrant of left female breast: Secondary | ICD-10-CM | POA: Diagnosis not present

## 2022-01-03 DIAGNOSIS — I6381 Other cerebral infarction due to occlusion or stenosis of small artery: Secondary | ICD-10-CM | POA: Diagnosis not present

## 2022-01-03 DIAGNOSIS — Z171 Estrogen receptor negative status [ER-]: Secondary | ICD-10-CM | POA: Diagnosis not present

## 2022-01-03 DIAGNOSIS — C50919 Malignant neoplasm of unspecified site of unspecified female breast: Secondary | ICD-10-CM | POA: Diagnosis not present

## 2022-01-03 LAB — ECHOCARDIOGRAM COMPLETE
AR max vel: 1.85 cm2
AV Area VTI: 1.92 cm2
AV Area mean vel: 1.46 cm2
AV Mean grad: 4 mmHg
AV Peak grad: 5 mmHg
Ao pk vel: 1.12 m/s
Area-P 1/2: 4.24 cm2
S' Lateral: 2.8 cm

## 2022-01-03 MED ORDER — GADOBUTROL 1 MMOL/ML IV SOLN
4.0000 mL | Freq: Once | INTRAVENOUS | Status: AC | PRN
  Administered 2022-01-03: 4 mL via INTRAVENOUS

## 2022-01-03 NOTE — Progress Notes (Signed)
*  PRELIMINARY RESULTS* Echocardiogram 2D Echocardiogram has been performed.  Sherrie Sport 01/03/2022, 9:30 AM

## 2022-01-04 ENCOUNTER — Ambulatory Visit: Payer: Self-pay | Admitting: General Surgery

## 2022-01-04 ENCOUNTER — Other Ambulatory Visit: Payer: Self-pay

## 2022-01-04 ENCOUNTER — Encounter
Admission: RE | Admit: 2022-01-04 | Discharge: 2022-01-04 | Disposition: A | Discharge status: Home / Self Care | Payer: PPO | Source: Ambulatory Visit | Attending: General Surgery | Admitting: General Surgery

## 2022-01-04 ENCOUNTER — Telehealth: Payer: Self-pay | Admitting: *Deleted

## 2022-01-04 DIAGNOSIS — C50412 Malignant neoplasm of upper-outer quadrant of left female breast: Secondary | ICD-10-CM

## 2022-01-04 HISTORY — DX: Family history of other specified conditions: Z84.89

## 2022-01-04 HISTORY — DX: Unspecified dementia, unspecified severity, without behavioral disturbance, psychotic disturbance, mood disturbance, and anxiety: F03.90

## 2022-01-04 HISTORY — DX: Pneumonia, unspecified organism: J18.9

## 2022-01-04 HISTORY — DX: Anemia, unspecified: D64.9

## 2022-01-04 MED ORDER — LORAZEPAM 0.5 MG PO TABS: 0.5000 mg | ORAL_TABLET | Freq: Four times a day (QID) | ORAL | 0 refills | Status: DC | PRN

## 2022-01-04 NOTE — Patient Instructions (Addendum)
Your procedure is scheduled on: 01/08/22 - Monday Report to the Registration Desk on the 1st floor of the [facility]. To find out your arrival time, please call [phone] between 1PM - 3PM on: 01/05/22 - Friday If your arrival time is 6:00 am, do not arrive prior to that time as the [facility] entrance doors do not open until 6:00 am.  REMEMBER: Instructions that are not followed completely may result in serious medical risk, up to and including death; or upon the discretion of your surgeon and anesthesiologist your surgery may need to be rescheduled.  Do not eat food after midnight the night before surgery.  No gum chewing, lozengers or hard candies.  You may however, drink CLEAR liquids up to 2 hours before you are scheduled to arrive for your surgery. Do not drink anything within 2 hours of your scheduled arrival time.  Clear liquids include: - water  - apple juice without pulp - gatorade (not RED colors) - black coffee or tea (Do NOT add milk or creamers to the coffee or tea) Do NOT drink anything that is not on this list.  TAKE THESE MEDICATIONS THE MORNING OF SURGERY WITH A SIP OF WATER:  none  One week prior to surgery: Stop Anti-inflammatories (NSAIDS) such as Advil, Aleve, Ibuprofen, Motrin, Naproxen, Naprosyn and Aspirin based products such as Excedrin, Goodys Powder, BC Powder.  Stop ANY OVER THE COUNTER supplements until after surgery.  You may take Tylenol if needed for pain up until the day of surgery.  No Alcohol for 24 hours before or after surgery.  No Smoking including e-cigarettes for 24 hours prior to surgery.  No chewable tobacco products for at least 6 hours prior to surgery.  No nicotine patches on the day of surgery.  Do not use any "recreational" drugs for at least a week prior to your surgery.  Please be advised that the combination of cocaine and anesthesia may have negative outcomes, up to and including death. If you test positive for cocaine,  your surgery will be cancelled.  On the morning of surgery brush your teeth with toothpaste and water, you may rinse your mouth with mouthwash if you wish. Do not swallow any toothpaste or mouthwash.  Do not wear jewelry, make-up, hairpins, clips or nail polish.  Do not wear lotions, powders, or perfumes.   Do not shave body from the neck down 48 hours prior to surgery just in case you cut yourself which could leave a site for infection.  Also, freshly shaved skin may become irritated if using the CHG soap.  Contact lenses, hearing aids and dentures may not be worn into surgery.  Do not bring valuables to the hospital. [facility] is not responsible for any missing/lost belongings or valuables.   Notify your doctor if there is any change in your medical condition (cold, fever, infection).  Wear comfortable clothing (specific to your surgery type) to the hospital.  After surgery, you can help prevent lung complications by doing breathing exercises.  Take deep breaths and cough every 1-2 hours. Your doctor may order a device called an Incentive Spirometer to help you take deep breaths. When coughing or sneezing, hold a pillow firmly against your incision with both hands. This is called "splinting." Doing this helps protect your incision. It also decreases belly discomfort.  If you are being admitted to the hospital overnight, leave your suitcase in the car. After surgery it may be brought to your room.  If you are  being discharged the day of surgery, you will not be allowed to drive home. You will need a responsible adult (18 years or older) to drive you home and stay with you that night.   If you are taking public transportation, you will need to have a responsible adult (18 years or older) with you. Please confirm with your physician that it is acceptable to use public transportation.   Please call the [facility]. at [phone] if you have any questions about  these instructions.  Surgery Visitation Policy:  Patients undergoing a surgery or procedure may have two family members or support persons with them as long as the person is not COVID-19 positive or experiencing its symptoms.   Inpatient Visitation:    Visiting hours are 7 a.m. to 8 p.m. Up to four visitors are allowed at one time in a patient room, including children. The visitors may rotate out with other people during the day. One designated support person (adult) may remain overnight.

## 2022-01-04 NOTE — Telephone Encounter (Signed)
Patient POA called stating that 5 r were sent to pharmacy and she was only able to get 2 of them. One is not in stock and 2 need PA and she states the pharmacy tol her that we are not responding to their request for PA. She is asking for a return call to let her know what to do. Maximiano Coss [phone]

## 2022-01-04 NOTE — Telephone Encounter (Signed)
Called Margaretha Sheffield to inform her that [facility] not have [facility] available which other pharmacy will she like to use. Niece stated to try the [facility] in [place] I will reach out to see if medication is available;spoke to [place] at [facility] and he stated medication is available so I went ahead and re-directed rx to Dr. Janese Banks for that pharmacy.   In regards to PA's: our office sent off the PA on 7/24. Recieved an approval for lidocaine cream but no response on Zofran. I spoke to [place] at the pts plan and per rep zofran was approved and she will be faxing over the approval letter. Will [facility] for [facility] to [facility] rx and will reach back out to [place] to make her aware of the approval.

## 2022-01-05 ENCOUNTER — Inpatient Hospital Stay: Payer: PPO

## 2022-01-08 ENCOUNTER — Encounter
Admission: RE | Disposition: A | Discharge status: Home / Self Care | Payer: Self-pay | Source: Home / Self Care | Attending: General Surgery

## 2022-01-08 ENCOUNTER — Ambulatory Visit: Payer: PPO | Admitting: Certified Registered Nurse Anesthetist

## 2022-01-08 ENCOUNTER — Ambulatory Visit: Payer: PPO

## 2022-01-08 ENCOUNTER — Other Ambulatory Visit: Payer: Self-pay

## 2022-01-08 ENCOUNTER — Ambulatory Visit
Admission: RE | Admit: 2022-01-08 | Discharge: 2022-01-08 | Disposition: A | Discharge status: Home / Self Care | Payer: PPO | Attending: General Surgery | Admitting: General Surgery

## 2022-01-08 ENCOUNTER — Encounter: Payer: Self-pay | Admitting: General Surgery

## 2022-01-08 DIAGNOSIS — Z85820 Personal history of malignant melanoma of skin: Secondary | ICD-10-CM | POA: Insufficient documentation

## 2022-01-08 DIAGNOSIS — I10 Essential (primary) hypertension: Secondary | ICD-10-CM | POA: Diagnosis not present

## 2022-01-08 DIAGNOSIS — Z87891 Personal history of nicotine dependence: Secondary | ICD-10-CM | POA: Insufficient documentation

## 2022-01-08 DIAGNOSIS — Z79899 Other long term (current) drug therapy: Secondary | ICD-10-CM | POA: Insufficient documentation

## 2022-01-08 DIAGNOSIS — C50919 Malignant neoplasm of unspecified site of unspecified female breast: Secondary | ICD-10-CM | POA: Diagnosis not present

## 2022-01-08 DIAGNOSIS — Z452 Encounter for adjustment and management of vascular access device: Secondary | ICD-10-CM | POA: Diagnosis not present

## 2022-01-08 DIAGNOSIS — I700 Atherosclerosis of aorta: Secondary | ICD-10-CM | POA: Diagnosis not present

## 2022-01-08 DIAGNOSIS — C50812 Malignant neoplasm of overlapping sites of left female breast: Secondary | ICD-10-CM | POA: Diagnosis not present

## 2022-01-08 HISTORY — PX: PORTACATH PLACEMENT: SHX2246

## 2022-01-08 SURGERY — INSERTION, TUNNELED CENTRAL VENOUS DEVICE, WITH PORT
Anesthesia: General | Site: Chest

## 2022-01-08 MED ORDER — FAMOTIDINE 20 MG PO TABS: 20.0000 mg | ORAL_TABLET | Freq: Once | ORAL | Status: AC

## 2022-01-08 MED ORDER — EPHEDRINE 5 MG/ML INJ
INTRAVENOUS | Status: AC
  Filled 2022-01-08: qty 5

## 2022-01-08 MED ORDER — LIDOCAINE HCL (PF) 2 % IJ SOLN
INTRAMUSCULAR | Status: AC
  Filled 2022-01-08: qty 5

## 2022-01-08 MED ORDER — HEPARIN SODIUM (PORCINE) 5000 UNIT/ML IJ SOLN
INTRAMUSCULAR | Status: AC
  Filled 2022-01-08: qty 1

## 2022-01-08 MED ORDER — PROPOFOL 1000 MG/100ML IV EMUL
INTRAVENOUS | Status: AC
  Filled 2022-01-08: qty 100

## 2022-01-08 MED ORDER — CEFAZOLIN SODIUM-DEXTROSE 2-4 GM/100ML-% IV SOLN
INTRAVENOUS | Status: AC
  Filled 2022-01-08: qty 100

## 2022-01-08 MED ORDER — OXYCODONE HCL 5 MG PO TABS: 5.0000 mg | ORAL_TABLET | ORAL | 0 refills | Status: DC | PRN

## 2022-01-08 MED ORDER — GLYCOPYRROLATE 0.2 MG/ML IJ SOLN
INTRAMUSCULAR | Status: AC
  Filled 2022-01-08: qty 1

## 2022-01-08 MED ORDER — BUPIVACAINE-EPINEPHRINE (PF) 0.25% -1:200000 IJ SOLN
INTRAMUSCULAR | Status: AC
  Filled 2022-01-08: qty 30

## 2022-01-08 MED ORDER — ORAL CARE MOUTH RINSE: 15.0000 mL | Freq: Once | OROMUCOSAL | Status: AC

## 2022-01-08 MED ORDER — EPHEDRINE SULFATE (PRESSORS) 50 MG/ML IJ SOLN
INTRAMUSCULAR | Status: DC | PRN
  Administered 2022-01-08: 5 mg via INTRAVENOUS

## 2022-01-08 MED ORDER — DEXAMETHASONE SODIUM PHOSPHATE 10 MG/ML IJ SOLN
INTRAMUSCULAR | Status: AC
  Filled 2022-01-08: qty 1

## 2022-01-08 MED ORDER — FENTANYL CITRATE (PF) 100 MCG/2ML IJ SOLN
INTRAMUSCULAR | Status: DC | PRN
  Administered 2022-01-08: 25 ug via INTRAVENOUS
  Administered 2022-01-08: 50 ug via INTRAVENOUS
  Administered 2022-01-08: 25 ug via INTRAVENOUS

## 2022-01-08 MED ORDER — ONDANSETRON HCL 4 MG/2ML IJ SOLN
INTRAMUSCULAR | Status: AC
  Filled 2022-01-08: qty 2

## 2022-01-08 MED ORDER — CHLORHEXIDINE GLUCONATE 0.12 % MT SOLN: 15.0000 mL | Freq: Once | OROMUCOSAL | Status: AC

## 2022-01-08 MED ORDER — CHLORHEXIDINE GLUCONATE 0.12 % MT SOLN
OROMUCOSAL | Status: AC
  Administered 2022-01-08: 15 mL via OROMUCOSAL
  Filled 2022-01-08: qty 15

## 2022-01-08 MED ORDER — PROPOFOL 500 MG/50ML IV EMUL
INTRAVENOUS | Status: DC | PRN
  Administered 2022-01-08: 50 ug/kg/min via INTRAVENOUS

## 2022-01-08 MED ORDER — MIDAZOLAM HCL 2 MG/2ML IJ SOLN
INTRAMUSCULAR | Status: AC
  Filled 2022-01-08: qty 2

## 2022-01-08 MED ORDER — FENTANYL CITRATE (PF) 100 MCG/2ML IJ SOLN: 25.0000 ug | INTRAMUSCULAR | Status: DC | PRN

## 2022-01-08 MED ORDER — CEFAZOLIN SODIUM-DEXTROSE 2-4 GM/100ML-% IV SOLN
2.0000 g | INTRAVENOUS | Status: AC
  Administered 2022-01-08: 2 g via INTRAVENOUS

## 2022-01-08 MED ORDER — LACTATED RINGERS IV SOLN: INTRAVENOUS | Status: DC

## 2022-01-08 MED ORDER — BUPIVACAINE-EPINEPHRINE (PF) 0.25% -1:200000 IJ SOLN
INTRAMUSCULAR | Status: DC | PRN
  Administered 2022-01-08: 8 mL

## 2022-01-08 MED ORDER — FAMOTIDINE 20 MG PO TABS
ORAL_TABLET | ORAL | Status: AC
  Administered 2022-01-08: 20 mg via ORAL
  Filled 2022-01-08: qty 1

## 2022-01-08 MED ORDER — SODIUM CHLORIDE 0.9 % IV SOLN
INTRAVENOUS | Status: DC | PRN
  Administered 2022-01-08: 15 mL via INTRAMUSCULAR

## 2022-01-08 MED ORDER — FENTANYL CITRATE (PF) 100 MCG/2ML IJ SOLN
INTRAMUSCULAR | Status: AC
  Filled 2022-01-08: qty 2

## 2022-01-08 MED ORDER — PROPOFOL 10 MG/ML IV BOLUS
INTRAVENOUS | Status: AC
  Filled 2022-01-08: qty 40

## 2022-01-08 MED FILL — Fosaprepitant Dimeglumine For IV Infusion 150 MG (Base Eq): INTRAVENOUS | Qty: 5 | Status: AC

## 2022-01-08 MED FILL — Dexamethasone Sodium Phosphate Inj 100 MG/10ML: INTRAMUSCULAR | Qty: 1 | Status: AC

## 2022-01-08 SURGICAL SUPPLY — 38 items
ADH SKN CLS APL DERMABOND .7 (GAUZE/BANDAGES/DRESSINGS) ×1
APL PRP STRL LF DISP 70% ISPRP (MISCELLANEOUS) ×1
BAG DECANTER FOR FLEXI CONT (MISCELLANEOUS) ×2 IMPLANT
BLADE SURG 11 STRL SS SAFETY (MISCELLANEOUS) ×2 IMPLANT
BLADE SURG SZ11 CARB STEEL (BLADE) ×2 IMPLANT
BOOT SUTURE AID YELLOW STND (SUTURE) ×2 IMPLANT
CHLORAPREP W/TINT 26 (MISCELLANEOUS) ×2 IMPLANT
COVER LIGHT HANDLE STERIS (MISCELLANEOUS) ×4 IMPLANT
DERMABOND ADVANCED (GAUZE/BANDAGES/DRESSINGS) ×1
DERMABOND ADVANCED .7 DNX12 (GAUZE/BANDAGES/DRESSINGS) ×1 IMPLANT
DRAPE C-ARM XRAY 36X54 (DRAPES) ×2 IMPLANT
ELECT REM PT RETURN 9FT ADLT (ELECTROSURGICAL) ×2
ELECTRODE REM PT RTRN 9FT ADLT (ELECTROSURGICAL) ×1 IMPLANT
GAUZE 4X4 16PLY ~~LOC~~+RFID DBL (SPONGE) ×2 IMPLANT
GLOVE BIO SURGEON STRL SZ 6.5 (GLOVE) ×4 IMPLANT
GLOVE BIOGEL PI IND STRL 6.5 (GLOVE) ×1 IMPLANT
GLOVE BIOGEL PI INDICATOR 6.5 (GLOVE) ×3
GOWN STRL REUS W/ TWL LRG LVL3 (GOWN DISPOSABLE) ×3 IMPLANT
GOWN STRL REUS W/TWL LRG LVL3 (GOWN DISPOSABLE) ×6
IV NS 500ML (IV SOLUTION) ×2
IV NS 500ML BAXH (IV SOLUTION) ×1 IMPLANT
KIT PORT POWER 8FR ISP CVUE (Port) ×2 IMPLANT
KIT TURNOVER KIT A (KITS) ×2 IMPLANT
LABEL OR SOLS (LABEL) ×2 IMPLANT
MANIFOLD NEPTUNE II (INSTRUMENTS) ×2 IMPLANT
NDL FILTER BLUNT 18X1 1/2 (NEEDLE) ×1 IMPLANT
NEEDLE FILTER BLUNT 18X 1/2SAF (NEEDLE) ×1
NEEDLE FILTER BLUNT 18X1 1/2 (NEEDLE) ×1 IMPLANT
PACK PORT-A-CATH (MISCELLANEOUS) ×2 IMPLANT
SUT MNCRL AB 4-0 PS2 18 (SUTURE) ×2 IMPLANT
SUT PROLENE 2 0 FS (SUTURE) ×2 IMPLANT
SUT VIC AB 2-0 SH 27 (SUTURE) ×2
SUT VIC AB 2-0 SH 27XBRD (SUTURE) ×1 IMPLANT
SUT VIC AB 3-0 SH 27 (SUTURE) ×2
SUT VIC AB 3-0 SH 27X BRD (SUTURE) ×1 IMPLANT
SYR 10ML LL (SYRINGE) ×4 IMPLANT
SYR 3ML LL SCALE MARK (SYRINGE) ×2 IMPLANT
WATER STERILE IRR 500ML POUR (IV SOLUTION) ×1 IMPLANT

## 2022-01-08 NOTE — Anesthesia Preprocedure Evaluation (Signed)
Anesthesia Evaluation  Patient identified by MRN, date of birth, ID band Patient awake    Reviewed: Allergy & Precautions, NPO status , Patient's Chart, lab work & pertinent test results  History of Anesthesia Complications Negative for: history of anesthetic complications  Airway Mallampati: II  TM Distance: >3 FB Neck ROM: Full    Dental no notable dental hx. (+) Dental Advisory Given, Teeth Intact   Pulmonary neg pulmonary ROS, neg sleep apnea, neg COPD, former smoker,    breath sounds clear to auscultation- rhonchi (-) wheezing      Cardiovascular Exercise Tolerance: Good hypertension, Pt. on medications (-) angina+ DVT  (-) CAD, (-) Past MI, (-) Cardiac Stents and (-) CABG (-) dysrhythmias (-) Valvular Problems/Murmurs Rhythm:Regular Rate:Normal - Systolic murmurs and - Diastolic murmurs    Neuro/Psych PSYCHIATRIC DISORDERS Anxiety Dementia negative neurological ROS     GI/Hepatic negative GI ROS, Neg liver ROS,   Endo/Other  negative endocrine ROSneg diabetes  Renal/GU negative Renal ROS     Musculoskeletal  (+) Arthritis ,   Abdominal (+) - obese,   Peds  Hematology negative hematology ROS (+)   Anesthesia Other Findings Past Medical History: No date: Anxiety No date: Arthritis     Comment:  NECK No date: Cancer ([place])     Comment:  BASAL CELL AND MELANOMA No date: Hypertension   Reproductive/Obstetrics                             Anesthesia Physical  Anesthesia Plan  ASA: 2  Anesthesia Plan: General   Post-op Pain Management:    Induction: Intravenous  PONV Risk Score and Plan: 3 and TIVA, Propofol infusion and Treatment may vary due to age or medical condition  Airway Management Planned: Natural Airway and Nasal Cannula  Additional Equipment: None  Intra-op Plan:   Post-operative Plan:   Informed Consent: I have reviewed the patients History and Physical,  chart, labs and discussed the procedure including the risks, benefits and alternatives for the proposed anesthesia with the patient or authorized representative who has indicated his/her understanding and acceptance.     Dental advisory given  Plan Discussed with: CRNA and Anesthesiologist  Anesthesia Plan Comments:         Anesthesia Quick Evaluation

## 2022-01-08 NOTE — Discharge Instructions (Addendum)
  Diet: Resume home heart healthy regular diet.   Activity: Increase activity as tolerated, but light activity and walking are encouraged. Do not drive or drink alcohol if taking narcotic pain medications.  Wound care: May shower with soapy water and pat dry (do not rub incisions), but no baths or submerging incision underwater until follow-up. (no swimming)   Medications: Resume all home medications. For mild to moderate pain: acetaminophen (Tylenol) or ibuprofen (if no kidney disease). Combining Tylenol with alcohol can substantially increase your risk of causing liver disease. Narcotic pain medications, if prescribed, can be used for severe pain, though may cause nausea, constipation, and drowsiness. Do not combine Tylenol and Norco within a 6 hour period as Norco contains Tylenol. If you do not need the narcotic pain medication, you do not need to fill the prescription.  Call office [phone]) at any time if any questions, worsening pain, fevers/chills, bleeding, drainage from incision site, or other concerns.    AMBULATORY SURGERY  DISCHARGE INSTRUCTIONS   The drugs that you were given will stay in your system until tomorrow so for the next 24 hours you should not:  Drive an automobile Make any legal decisions Drink any alcoholic beverage   You may resume regular meals tomorrow.  Today it is better to start with liquids and gradually work up to solid foods.  You may eat anything you prefer, but it is better to start with liquids, then soup and crackers, and gradually work up to solid foods.   Please notify your doctor immediately if you have any unusual bleeding, trouble breathing, redness and pain at the surgery site, drainage, fever, or pain not relieved by medication.    Additional Instructions:        Please contact your physician with any problems or Same Day Surgery at [phone], Monday through Friday 6 am to 4 pm, or [place] at [facility] number at  [phone].

## 2022-01-08 NOTE — Anesthesia Postprocedure Evaluation (Signed)
Anesthesia Post Note  Patient: Patricia Miller  Procedure(s) Performed: INSERTION PORT-A-CATH (Chest)  Patient location during evaluation: PACU Anesthesia Type: General Level of consciousness: awake and alert Pain management: pain level controlled Vital Signs Assessment: post-procedure vital signs reviewed and stable Respiratory status: spontaneous breathing, nonlabored ventilation, respiratory function stable and patient connected to nasal cannula oxygen Cardiovascular status: blood pressure returned to baseline and stable Postop Assessment: no apparent nausea or vomiting Anesthetic complications: no   No notable events documented.   Last Vitals:  Vitals:   01/08/22 0900 01/08/22 0906  BP: (!) 162/85 (!) 171/74  Pulse: 60 71  Resp: 16 18  Temp: (!) 36.2 C (!) 36.2 C  SpO2: 99% 98%    Last Pain:  Vitals:   01/08/22 0906  TempSrc: Temporal  PainSc: 0-No pain                 Martha Clan

## 2022-01-08 NOTE — H&P (Signed)
History of Present Illness Patricia Miller is a 76 y.o. female with breast cancer.  Patient presents today's to the operating room for insertion of Port-A-Cath.  Patient previously evaluated in the office and Port-A-Cath procedure discussed.  She has been evaluated by medical oncology and decision is to proceed with neoadjuvant chemotherapy.  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 path pending   BREAST BIOPSY Right 12/19/2021   Stereo bx-calcs, "COIL" clip-path pending   BREAST BIOPSY Right 12/19/2021   u/s bx-mass, 3:00, retroareolar, "HEART" clip-path pending   COLONOSCOPY     COLONOSCOPY WITH PROPOFOL N/A 02/20/2017   Procedure: COLONOSCOPY WITH PROPOFOL;  Surgeon: Manya Silvas, MD;  Location: [facility] ENDOSCOPY;  Service: Endoscopy;  Laterality: N/A;   HYSTEROSCOPY WITH D & C N/A 07/29/2015   Procedure: DILATATION AND CURETTAGE /HYSTEROSCOPY;  Surgeon: Benjaman Kindler, MD;  Location: [facility] ORS;  Service: Gynecology;  Laterality: N/A;   HYSTEROSCOPY WITH D & C N/A 01/24/2018   Procedure: DILATATION AND CURETTAGE /HYSTEROSCOPY;  Surgeon: Benjaman Kindler, MD;  Location: [facility] ORS;  Service: Gynecology;  Laterality: N/A;   LAPAROSCOPY N/A 01/24/2018   Procedure: LAPAROSCOPY DIAGNOSTIC;  Surgeon: Benjaman Kindler, MD;  Location: [facility] ORS;  Service:  Gynecology;  Laterality: N/A;   PERIPHERAL VASCULAR THROMBECTOMY N/A 02/29/2020   Procedure: PERIPHERAL VASCULAR THROMBECTOMY / THROMBOLYSIS WITH POSSIBLE PULMONARY THROMBECTOMY / THROMBOLYSIS;  Surgeon: Algernon Huxley, MD;  Location: [facility];  Service: Cardiovascular;  Laterality: N/A;   TONSILLECTOMY     AGE 52   TOTAL LAPAROSCOPIC HYSTERECTOMY WITH BILATERAL SALPINGO OOPHORECTOMY Bilateral 12/28/2019   Procedure: TOTAL LAPAROSCOPIC HYSTERECTOMY WITH BILATERAL SALPINGO OOPHORECTOMY;  Surgeon: Benjaman Kindler, MD;  Location: [facility] ORS;  Service: Gynecology;  Laterality: Bilateral;   TUBAL LIGATION      No Known Allergies  Current Facility-Administered Medications  Medication Dose Route Frequency Provider Last Rate Last Admin   ceFAZolin (ANCEF) 2-4 GM/100ML-% IVPB            ceFAZolin (ANCEF) IVPB 2g/100 mL premix  2 g Intravenous On Call to OR Herbert Pun, MD       lactated ringers infusion   Intravenous Continuous Arita Miss, MD 10 mL/hr at 01/08/22 4034 Continued from Pre-op at 01/08/22 7425    Family History Family History  Problem Relation Age of Onset   Stomach cancer Sister    Hypertension Sister    Hypertension Sister    Diabetes Niece    Hypertension Niece    Breast cancer Neg Hx        Social History Social History   Tobacco Use   Smoking status: Former    Packs/day: 0.50    Years:  15.00    Total pack years: 7.50    Types: Cigarettes    Quit date: 07/21/2000    Years since quitting: 21.4   Smokeless tobacco: Never  Vaping Use   Vaping Use: Never used  Substance Use Topics   Alcohol use: Not Currently   Drug use: No        ROS Full ROS of systems performed and is otherwise negative there than what is stated in the HPI  Physical Exam Blood pressure (!) 174/81, pulse 68, temperature (!) 97.5 F (36.4 C), temperature source Temporal, resp. rate 17, SpO2 98 %.  CONSTITUTIONAL: Alert, oriented x3 EYES: Pupils equal, round, and  reactive to light, Sclera non-icteric. EARS, NOSE, MOUTH AND THROAT: The oropharynx is clear. Oral mucosa is pink and moist. Hearing is intact to voice.  NECK: Trachea is midline, and there is no jugular venous distension. Thyroid is without palpable abnormalities. LYMPH NODES:  Lymph nodes in the neck are not enlarged. RESPIRATORY:  Lungs are clear, and breath sounds are equal bilaterally. Normal respiratory effort without pathologic use of accessory muscles. CARDIOVASCULAR: Heart is regular without murmurs, gallops, or rubs. BREAST: Left breast palpable mass, no skin changes. GI: The abdomen is soft, nontender, and nondistended. There were no palpable masses. There was no hepatosplenomegaly. There were normal bowel sounds. GU: Deferred MUSCULOSKELETAL:  Normal muscle strength and tone in all four extremities.    SKIN: Skin turgor is normal. There are no pathologic skin lesions.  NEUROLOGIC:  Motor and sensation is grossly normal.  Cranial nerves are grossly intact. PSYCH:  Alert and oriented to person, place and time. Affect is normal.  Data Reviewed Bilateral breast MRI reviewed.  Large left breast mass identified.  I have personally reviewed the patient's imaging and medical records.    Assessment    Breast cancer  Plan    Insertion of Port-A-Cath.  Patient has been again oriented about the procedure of the insertion of Port-A-Cath.  She was oriented about the risk that includes infection, pain, bleeding, pneumothorax, hemothorax, arteriovenous fistula, among others.  The patient reports she understood the risk and agreed to proceed with insertion of Port-A-Cath.  Herbert Pun, MD  Herbert Pun 01/08/2022, 7:08 AM

## 2022-01-08 NOTE — Transfer of Care (Signed)
Immediate Anesthesia Transfer of Care Note  Patient: [name]  Procedure(s) Performed: INSERTION PORT-A-CATH (Chest)  Patient Location: PACU  Anesthesia Type:Mac  Level of Consciousness: awake, alert  and oriented  Airway & Oxygen Therapy: Patient Spontanous Breathing and Patient connected to face mask oxygen  Post-op Assessment: Report given to RN and Post -op Vital signs reviewed and stable  Post vital signs: Reviewed and stable  Last Vitals:  Vitals Value Taken Time  BP 138/90 01/08/22 0830  Temp    Pulse 69 01/08/22 0829  Resp 12 01/08/22 0830  SpO2 98 % 01/08/22 0829  Vitals shown include unvalidated device data.  Last Pain:  Vitals:   01/08/22 0630  TempSrc: Temporal  PainSc: 0-No pain         Complications: No notable events documented.

## 2022-01-08 NOTE — Anesthesia Procedure Notes (Signed)
Date/Time: 01/08/2022 7:44 AM  Performed by: Demetrius Charity, CRNAPre-anesthesia Checklist: Patient identified, Emergency Drugs available, Suction available, Patient being monitored and Timeout performed Patient Re-evaluated:Patient Re-evaluated prior to induction Oxygen Delivery Method: Simple face mask Induction Type: IV induction Placement Confirmation: CO2 detector and positive ETCO2

## 2022-01-08 NOTE — Op Note (Signed)
SURGICAL PROCEDURE REPORT  DATE OF PROCEDURE: 01/08/2022   SURGEON: Dr. Windell Moment   ANESTHESIA: Local with light IV sedation   PRE-OPERATIVE DIAGNOSIS: Advanced breast cancer requiring durable central venous access for chemotherapy   POST-OPERATIVE DIAGNOSIS: Same  PROCEDURE(S): (cpt: 67341) 1.) Percutaneous access of Right internal jugular vein under ultrasound guidance 2.) Insertion of tunneled Right internal jugular central venous catheter with subcutaneous port  INTRAOPERATIVE FINDINGS: Patent easily compressible Right internal jugular vein with appropriate respiratory variations and well-secured tunneled central venous catheter with subcutaneous port at completion of the procedure  ESTIMATED BLOOD LOSS: Minimal (<20 mL)   SPECIMENS: None   IMPLANTS: 36F tunneled Bard PowerPort central venous catheter with subcutaneous port  DRAINS: None   COMPLICATIONS: None apparent   CONDITION AT COMPLETION: Hemodynamically stable, awake   DISPOSITION: PACU   INDICATION(S) FOR PROCEDURE:  Patient is a 76 y.o. female who presented with advanced breast cancer requiring durable central venous access for chemotherapy. All risks, benefits, and alternatives to above elective procedures were discussed with the patient, who elected to proceed, and informed consent was accordingly obtained at that time.  DETAILS OF PROCEDURE:  Patient was brought to the operative suite and appropriately identified. In Trendelenburg position, Right IJ venous access site was prepped and draped in the usual sterile fashion, and following a brief timeout, percutaneous Right IJ venous access was obtained under ultrasound guidance using Seldinger technique, by which local anesthetic was injected over the Right IJ vein, and access needle was inserted under direct ultrasound visualization into the Right IJ vein, through which soft guidewire was advanced, over which access needle was withdrawn. Guidewire was secured,  attention was directed to injection of local anesthetic along the planned tunnel site, 2-3 cm transverse Right chest incision was made and confirmed to accommodate the subcutaneous port, and flushed catheter was tunneled retrograde from the port site over the Right chest to the Right IJ access site with the attached port well-secured to the catheter and within the subcutaneous pocket. Insertion sheath was advanced over the guidewire, which was withdrawn along with the insertion sheath dilator. The catheter was introduced through the sheath and left on the [place] junction under fluoro guidance and catheter cut to desire lenght. Catheter connected to port and fixed to the pocket on two side to avoid twisting. Port was confirmed to withdraw blood and flush easily, after which concentrated heparin was instilled into the port and catheter. Dermis at the subcutaneous pocket was re-approximated using buried interrupted 3-0 Vicryl suture, and 4-0 Monocryl suture was used to re-approximate skin at the insertion and subcutaneous port sites in running subcuticular fashion for the subcutaneous port and buried interrupted fashion for the insertion site. Skin was cleaned, dried, and sterile skin glue was applied. Patient was then safely transferred to PACU for a chest x-ray. Ultrasound images are available on paper chart and Fluoroscopy guidance images are available in Epic.

## 2022-01-09 ENCOUNTER — Other Ambulatory Visit: Payer: Self-pay

## 2022-01-09 ENCOUNTER — Encounter: Payer: Self-pay | Admitting: *Deleted

## 2022-01-09 ENCOUNTER — Encounter: Payer: Self-pay | Admitting: Oncology

## 2022-01-09 ENCOUNTER — Inpatient Hospital Stay: Payer: PPO | Admitting: Oncology

## 2022-01-09 ENCOUNTER — Inpatient Hospital Stay: Payer: PPO

## 2022-01-09 ENCOUNTER — Inpatient Hospital Stay: Payer: PPO | Attending: Oncology

## 2022-01-09 VITALS — BP 150/75 | HR 58 | Temp 97.5°F | Resp 16 | Wt 105.3 lb

## 2022-01-09 VITALS — BP 154/66 | HR 68

## 2022-01-09 DIAGNOSIS — Z5189 Encounter for other specified aftercare: Secondary | ICD-10-CM | POA: Insufficient documentation

## 2022-01-09 DIAGNOSIS — Z79899 Other long term (current) drug therapy: Secondary | ICD-10-CM | POA: Diagnosis not present

## 2022-01-09 DIAGNOSIS — Z171 Estrogen receptor negative status [ER-]: Secondary | ICD-10-CM | POA: Diagnosis not present

## 2022-01-09 DIAGNOSIS — Z5112 Encounter for antineoplastic immunotherapy: Secondary | ICD-10-CM | POA: Diagnosis not present

## 2022-01-09 DIAGNOSIS — C50412 Malignant neoplasm of upper-outer quadrant of left female breast: Secondary | ICD-10-CM | POA: Diagnosis not present

## 2022-01-09 DIAGNOSIS — Z5111 Encounter for antineoplastic chemotherapy: Secondary | ICD-10-CM

## 2022-01-09 LAB — CBC WITH DIFFERENTIAL/PLATELET
Abs Immature Granulocytes: 0.01 10*3/uL (ref 0.00–0.07)
Basophils Absolute: 0 10*3/uL (ref 0.0–0.1)
Basophils Relative: 1 %
Eosinophils Absolute: 0.2 10*3/uL (ref 0.0–0.5)
Eosinophils Relative: 4 %
HCT: 43.7 % (ref 36.0–46.0)
Hemoglobin: 14.6 g/dL (ref 12.0–15.0)
Immature Granulocytes: 0 %
Lymphocytes Relative: 27 %
Lymphs Abs: 1.5 10*3/uL (ref 0.7–4.0)
MCH: 31.1 pg (ref 26.0–34.0)
MCHC: 33.4 g/dL (ref 30.0–36.0)
MCV: 93.2 fL (ref 80.0–100.0)
Monocytes Absolute: 0.4 10*3/uL (ref 0.1–1.0)
Monocytes Relative: 7 %
Neutro Abs: 3.6 10*3/uL (ref 1.7–7.7)
Neutrophils Relative %: 61 %
Platelets: 188 10*3/uL (ref 150–400)
RBC: 4.69 MIL/uL (ref 3.87–5.11)
RDW: 12.3 % (ref 11.5–15.5)
WBC: 5.8 10*3/uL (ref 4.0–10.5)
nRBC: 0 % (ref 0.0–0.2)

## 2022-01-09 LAB — COMPREHENSIVE METABOLIC PANEL
ALT: 13 U/L (ref 0–44)
AST: 23 U/L (ref 15–41)
Albumin: 3.8 g/dL (ref 3.5–5.0)
Alkaline Phosphatase: 70 U/L (ref 38–126)
Anion gap: 7 (ref 5–15)
BUN: 13 mg/dL (ref 8–23)
CO2: 26 mmol/L (ref 22–32)
Calcium: 9.2 mg/dL (ref 8.9–10.3)
Chloride: 108 mmol/L (ref 98–111)
Creatinine, Ser: 0.77 mg/dL (ref 0.44–1.00)
GFR, Estimated: >60 mL/min (ref >60)
Glucose, Bld: 99 mg/dL (ref 70–99)
Potassium: 3.7 mmol/L (ref 3.5–5.1)
Sodium: 141 mmol/L (ref 135–145)
Total Bilirubin: 1.4 mg/dL — ABNORMAL HIGH (ref 0.3–1.2)
Total Protein: 6.9 g/dL (ref 6.5–8.1)

## 2022-01-09 LAB — TSH: TSH: 5.054 u[IU]/mL — ABNORMAL HIGH (ref 0.350–4.500)

## 2022-01-09 MED ORDER — SODIUM CHLORIDE 0.9 % IV SOLN
150.0000 mg | Freq: Once | INTRAVENOUS | Status: AC
  Administered 2022-01-09: 150 mg via INTRAVENOUS
  Filled 2022-01-09: qty 150

## 2022-01-09 MED ORDER — PALONOSETRON HCL INJECTION 0.25 MG/5ML
0.2500 mg | Freq: Once | INTRAVENOUS | Status: AC
  Administered 2022-01-09: 0.25 mg via INTRAVENOUS
  Filled 2022-01-09: qty 5

## 2022-01-09 MED ORDER — DOXORUBICIN HCL CHEMO IV INJECTION 2 MG/ML
60.0000 mg/m2 | Freq: Once | INTRAVENOUS | Status: AC
  Administered 2022-01-09: 84 mg via INTRAVENOUS
  Filled 2022-01-09: qty 42

## 2022-01-09 MED ORDER — SODIUM CHLORIDE 0.9 % IV SOLN
10.0000 mg | Freq: Once | INTRAVENOUS | Status: AC
  Administered 2022-01-09: 10 mg via INTRAVENOUS
  Filled 2022-01-09: qty 10

## 2022-01-09 MED ORDER — HEPARIN SOD (PORK) LOCK FLUSH 100 UNIT/ML IV SOLN
500.0000 [IU] | Freq: Once | INTRAVENOUS | Status: AC
  Filled 2022-01-09: qty 5

## 2022-01-09 MED ORDER — HEPARIN SOD (PORK) LOCK FLUSH 100 UNIT/ML IV SOLN
INTRAVENOUS | Status: AC
  Administered 2022-01-09: 500 [IU] via INTRAVENOUS
  Filled 2022-01-09: qty 5

## 2022-01-09 MED ORDER — SODIUM CHLORIDE 0.9 % IV SOLN
600.0000 mg/m2 | Freq: Once | INTRAVENOUS | Status: AC
  Administered 2022-01-09: 840 mg via INTRAVENOUS
  Filled 2022-01-09: qty 42

## 2022-01-09 MED ORDER — SODIUM CHLORIDE 0.9% FLUSH
10.0000 mL | Freq: Once | INTRAVENOUS | Status: AC
  Administered 2022-01-09: 10 mL via INTRAVENOUS
  Filled 2022-01-09: qty 10

## 2022-01-09 MED ORDER — SODIUM CHLORIDE 0.9 % IV SOLN
Freq: Once | INTRAVENOUS | Status: AC
  Filled 2022-01-09: qty 250

## 2022-01-09 MED ORDER — HEPARIN SOD (PORK) LOCK FLUSH 100 UNIT/ML IV SOLN
500.0000 [IU] | Freq: Once | INTRAVENOUS | Status: DC | PRN
  Filled 2022-01-09: qty 5

## 2022-01-09 MED ORDER — SODIUM CHLORIDE 0.9 % IV SOLN
200.0000 mg | Freq: Once | INTRAVENOUS | Status: AC
  Administered 2022-01-09: 200 mg via INTRAVENOUS
  Filled 2022-01-09: qty 8

## 2022-01-09 NOTE — Patient Instructions (Addendum)
[facility] AT [place]  Discharge Instructions: Thank you for choosing [facility] to provide your oncology and hematology care.  If you have a lab appointment with the [facility], please go directly to the [facility] and check in at the registration area.  Wear comfortable clothing and clothing appropriate for easy access to any Portacath or PICC line.   We strive to give you quality time with your provider. You may need to reschedule your appointment if you arrive late (15 or more minutes).  Arriving late affects you and other patients whose appointments are after yours.  Also, if you miss three or more appointments without notifying the office, you may be dismissed from the clinic at the provider's discretion.      For prescription refill requests, have your pharmacy contact our office and allow 72 hours for refills to be completed.    Today you received the following chemotherapy and/or immunotherapy agents Adriamycin, Cytoxan, Keytruda      To help prevent nausea and vomiting after your treatment, we encourage you to take your nausea medication as directed.  BELOW ARE SYMPTOMS THAT SHOULD BE REPORTED IMMEDIATELY: *FEVER GREATER THAN 100.4 F (38 C) OR HIGHER *CHILLS OR SWEATING *NAUSEA AND VOMITING THAT IS NOT CONTROLLED WITH YOUR NAUSEA MEDICATION *UNUSUAL SHORTNESS OF BREATH *UNUSUAL BRUISING OR BLEEDING *URINARY PROBLEMS (pain or burning when urinating, or frequent urination) *BOWEL PROBLEMS (unusual diarrhea, constipation, pain near the anus) TENDERNESS IN MOUTH AND THROAT WITH OR WITHOUT PRESENCE OF ULCERS (sore throat, sores in mouth, or a toothache) UNUSUAL RASH, SWELLING OR PAIN  UNUSUAL VAGINAL DISCHARGE OR ITCHING   Items with * indicate a potential emergency and should be followed up as soon as possible or go to the Emergency Department if any problems should occur.  Please show the CHEMOTHERAPY ALERT CARD or IMMUNOTHERAPY ALERT  CARD at check-in to the Emergency Department and triage nurse.  Should you have questions after your visit or need to cancel or reschedule your appointment, please contact [facility] [place] [facility] [place]  [phone] and follow the prompts.  Office hours are 8:00 a.m. to 4:30 p.m. Monday - Friday. Please note that voicemails left after 4:00 p.m. may not be returned until the following business day.  We are closed weekends and major holidays. You have access to a nurse at all times for urgent questions. Please call the main number to the clinic [phone] and follow the prompts.  For any non-urgent questions, you may also contact your provider using MyChart. We now offer e-Visits for anyone 26 and older to request care online for non-urgent symptoms. For details visit [email or web address].   Also download the MyChart app! Go to the app store, search "MyChart", open the app, select [facility], and log in with your MyChart username and password.  Masks are optional in the cancer centers. If you would like for your care team to wear a mask while they are taking care of you, please let them know. For doctor visits, patients may have with them one support person who is at least 78 years old. At this time, visitors are not allowed in the infusion area.  Pembrolizumab injection What is this medication? PEMBROLIZUMAB (pem broe liz ue mab) is a monoclonal antibody. It is used to treat certain types of cancer. This medicine may be used for other purposes; ask your health care provider or pharmacist if you have questions. COMMON BRAND NAME(S): Keytruda What should I tell my care  team before I take this medication? They need to know if you have any of these conditions: autoimmune diseases like Crohn's disease, ulcerative colitis, or lupus have had or planning to have an allogeneic stem cell transplant (uses someone else's stem cells) history of organ transplant history of chest  radiation nervous system problems like myasthenia gravis or Guillain-Barre syndrome an unusual or allergic reaction to pembrolizumab, other medicines, foods, dyes, or preservatives pregnant or trying to get pregnant breast-feeding How should I use this medication? This medicine is for infusion into a vein. It is given by a health care professional in a hospital or clinic setting. A special MedGuide will be given to you before each treatment. Be sure to read this information carefully each time. Talk to your pediatrician regarding the use of this medicine in children. While this drug may be prescribed for children as young as 6 months for selected conditions, precautions do apply. Overdosage: If you think you have taken too much of this medicine contact a poison control center or emergency room at once. NOTE: This medicine is only for you. Do not share this medicine with others. What if I miss a dose? It is important not to miss your dose. Call your doctor or health care professional if you are unable to keep an appointment. What may interact with this medication? Interactions have not been studied. This list may not describe all possible interactions. Give your health care provider a list of all the medicines, herbs, non-prescription drugs, or dietary supplements you use. Also tell them if you smoke, drink alcohol, or use illegal drugs. Some items may interact with your medicine. What should I watch for while using this medication? Your condition will be monitored carefully while you are receiving this medicine. You may need blood work done while you are taking this medicine. Do not become pregnant while taking this medicine or for 4 months after stopping it. Women should inform their doctor if they wish to become pregnant or think they might be pregnant. There is a potential for serious side effects to an unborn child. Talk to your health care professional or pharmacist for more information. Do  not breast-feed an infant while taking this medicine or for 4 months after the last dose. What side effects may I notice from receiving this medication? Side effects that you should report to your doctor or health care professional as soon as possible: allergic reactions like skin rash, itching or hives, swelling of the face, lips, or tongue bloody or black, tarry breathing problems changes in vision chest pain chills confusion constipation cough diarrhea dizziness or feeling faint or lightheaded fast or irregular heartbeat fever flushing joint pain low blood counts - this medicine may decrease the number of white blood cells, red blood cells and platelets. You may be at increased risk for infections and bleeding. muscle pain muscle weakness pain, tingling, numbness in the hands or feet persistent headache redness, blistering, peeling or loosening of the skin, including inside the mouth signs and symptoms of high blood sugar such as dizziness; dry mouth; dry skin; fruity breath; nausea; stomach pain; increased hunger or thirst; increased urination signs and symptoms of kidney injury like trouble passing urine or change in the amount of urine signs and symptoms of liver injury like dark urine, light-colored stools, loss of appetite, nausea, right upper belly pain, yellowing of the eyes or skin sweating swollen lymph nodes weight loss Side effects that usually do not require medical attention (report to your doctor  or health care professional if they continue or are bothersome): decreased appetite hair loss tiredness This list may not describe all possible side effects. Call your doctor for medical advice about side effects. You may report side effects to FDA at 1-800-FDA-1088. Where should I keep my medication? This drug is given in a hospital or clinic and will not be stored at home. NOTE: This sheet is a summary. It may not cover all possible information. If you have questions  about this medicine, talk to your doctor, pharmacist, or health care provider.  2023 Elsevier/Gold Standard (2021-04-28 00:00:00)   Doxorubicin injection What is this medication? DOXORUBICIN (dox oh ROO bi sin) is a chemotherapy drug. It is used to treat many kinds of cancer like leukemia, lymphoma, neuroblastoma, sarcoma, and Wilms' tumor. It is also used to treat bladder cancer, breast cancer, lung cancer, ovarian cancer, stomach cancer, and thyroid cancer. This medicine may be used for other purposes; ask your health care provider or pharmacist if you have questions. COMMON BRAND NAME(S): Adriamycin, Adriamycin PFS, Adriamycin RDF, Rubex What should I tell my care team before I take this medication? They need to know if you have any of these conditions: heart disease history of low blood counts caused by a medicine liver disease recent or ongoing radiation therapy an unusual or allergic reaction to doxorubicin, other chemotherapy agents, other medicines, foods, dyes, or preservatives pregnant or trying to get pregnant breast-feeding How should I use this medication? This drug is given as an infusion into a vein. It is administered in a hospital or clinic by a specially trained health care professional. If you have pain, swelling, burning or any unusual feeling around the site of your injection, tell your health care professional right away. Talk to your pediatrician regarding the use of this medicine in children. Special care may be needed. Overdosage: If you think you have taken too much of this medicine contact a poison control center or emergency room at once. NOTE: This medicine is only for you. Do not share this medicine with others. What if I miss a dose? It is important not to miss your dose. Call your doctor or health care professional if you are unable to keep an appointment. What may interact with this medication? This medicine may interact with the following  medications: 6-mercaptopurine paclitaxel phenytoin St. John's Wort trastuzumab verapamil This list may not describe all possible interactions. Give your health care provider a list of all the medicines, herbs, non-prescription drugs, or dietary supplements you use. Also tell them if you smoke, drink alcohol, or use illegal drugs. Some items may interact with your medicine. What should I watch for while using this medication? This drug may make you feel generally unwell. This is not uncommon, as chemotherapy can affect healthy cells as well as cancer cells. Report any side effects. Continue your course of treatment even though you feel ill unless your doctor tells you to stop. There is a maximum amount of this medicine you should receive throughout your life. The amount depends on the medical condition being treated and your overall health. Your doctor will watch how much of this medicine you receive in your lifetime. Tell your doctor if you have taken this medicine before. You may need blood work done while you are taking this medicine. Your urine may turn red for a few days after your dose. This is not blood. If your urine is dark or brown, call your doctor. In some cases, you may be given  additional medicines to help with side effects. Follow all directions for their use. Call your doctor or health care professional for advice if you get a fever, chills or sore throat, or other symptoms of a cold or flu. Do not treat yourself. This drug decreases your body's ability to fight infections. Try to avoid being around people who are sick. This medicine may increase your risk to bruise or bleed. Call your doctor or health care professional if you notice any unusual bleeding. Talk to your doctor about your risk of cancer. You may be more at risk for certain types of cancers if you take this medicine. Do not become pregnant while taking this medicine or for 6 months after stopping it. Women should inform  their doctor if they wish to become pregnant or think they might be pregnant. Men should not father a child while taking this medicine and for 6 months after stopping it. There is a potential for serious side effects to an unborn child. Talk to your health care professional or pharmacist for more information. Do not breast-feed an infant while taking this medicine. This medicine has caused ovarian failure in some women and reduced sperm counts in some men This medicine may interfere with the ability to have a child. Talk with your doctor or health care professional if you are concerned about your fertility. This medicine may cause a decrease in Co-Enzyme Q-10. You should make sure that you get enough Co-Enzyme Q-10 while you are taking this medicine. Discuss the foods you eat and the vitamins you take with your health care professional. What side effects may I notice from receiving this medication? Side effects that you should report to your doctor or health care professional as soon as possible: allergic reactions like skin rash, itching or hives, swelling of the face, lips, or tongue breathing problems chest pain fast or irregular heartbeat low blood counts - this medicine may decrease the number of white blood cells, red blood cells and platelets. You may be at increased risk for infections and bleeding. pain, redness, or irritation at site where injected signs of infection - fever or chills, cough, sore throat, pain or difficulty passing urine signs of decreased platelets or bleeding - bruising, pinpoint red spots on the skin, black, tarry stools, blood in the urine swelling of the ankles, feet, hands tiredness weakness Side effects that usually do not require medical attention (report to your doctor or health care professional if they continue or are bothersome): diarrhea hair loss mouth sores nail discoloration or damage nausea red colored urine vomiting This list may not describe all  possible side effects. Call your doctor for medical advice about side effects. You may report side effects to FDA at 1-800-FDA-1088. Where should I keep my medication? This drug is given in a hospital or clinic and will not be stored at home. NOTE: This sheet is a summary. It may not cover all possible information. If you have questions about this medicine, talk to your doctor, pharmacist, or health care provider.  2023 Elsevier/Gold Standard (2017-01-31 00:00:00)   Cyclophosphamide Injection What is this medication? CYCLOPHOSPHAMIDE (sye kloe FOSS fa mide) is a chemotherapy drug. It slows the growth of cancer cells. This medicine is used to treat many types of cancer like lymphoma, myeloma, leukemia, breast cancer, and ovarian cancer, to name a few. This medicine may be used for other purposes; ask your health care provider or pharmacist if you have questions. COMMON BRAND NAME(S): Cyclophosphamide, Cytoxan, Neosar What  should I tell my care team before I take this medication? They need to know if you have any of these conditions: heart disease history of irregular heartbeat infection kidney disease liver disease low blood counts, like white cells, platelets, or red blood cells on hemodialysis recent or ongoing radiation therapy scarring or thickening of the lungs trouble passing urine an unusual or allergic reaction to cyclophosphamide, other medicines, foods, dyes, or preservatives pregnant or trying to get pregnant breast-feeding How should I use this medication? This drug is usually given as an injection into a vein or muscle or by infusion into a vein. It is administered in a hospital or clinic by a specially trained health care professional. Talk to your pediatrician regarding the use of this medicine in children. Special care may be needed. Overdosage: If you think you have taken too much of this medicine contact a poison control center or emergency room at once. NOTE: This  medicine is only for you. Do not share this medicine with others. What if I miss a dose? It is important not to miss your dose. Call your doctor or health care professional if you are unable to keep an appointment. What may interact with this medication? amphotericin B azathioprine certain antivirals for HIV or hepatitis certain medicines for blood pressure, heart disease, irregular heart beat certain medicines that treat or prevent blood clots like warfarin certain other medicines for cancer cyclosporine etanercept indomethacin medicines that relax muscles for surgery medicines to increase blood counts metronidazole This list may not describe all possible interactions. Give your health care provider a list of all the medicines, herbs, non-prescription drugs, or dietary supplements you use. Also tell them if you smoke, drink alcohol, or use illegal drugs. Some items may interact with your medicine. What should I watch for while using this medication? Your condition will be monitored carefully while you are receiving this medicine. You may need blood work done while you are taking this medicine. Drink water or other fluids as directed. Urinate often, even at night. Some products may contain alcohol. Ask your health care professional if this medicine contains alcohol. Be sure to tell all health care professionals you are taking this medicine. Certain medicines, like metronidazole and disulfiram, can cause an unpleasant reaction when taken with alcohol. The reaction includes flushing, headache, nausea, vomiting, sweating, and increased thirst. The reaction can last from 30 minutes to several hours. Do not become pregnant while taking this medicine or for 1 year after stopping it. Women should inform their health care professional if they wish to become pregnant or think they might be pregnant. Men should not father a child while taking this medicine and for 4 months after stopping it. There is  potential for serious side effects to an unborn child. Talk to your health care professional for more information. Do not breast-feed an infant while taking this medicine or for 1 week after stopping it. This medicine has caused ovarian failure in some women. This medicine may make it more difficult to get pregnant. Talk to your health care professional if you are concerned about your fertility. This medicine has caused decreased sperm counts in some men. This may make it more difficult to father a child. Talk to your health care professional if you are concerned about your fertility. Call your health care professional for advice if you get a fever, chills, or sore throat, or other symptoms of a cold or flu. Do not treat yourself. This medicine decreases your body's ability  to fight infections. Try to avoid being around people who are sick. Avoid taking medicines that contain aspirin, acetaminophen, ibuprofen, naproxen, or ketoprofen unless instructed by your health care professional. These medicines may hide a fever. Talk to your health care professional about your risk of cancer. You may be more at risk for certain types of cancer if you take this medicine. If you are going to need surgery or other procedure, tell your health care professional that you are using this medicine. Be careful brushing or flossing your teeth or using a toothpick because you may get an infection or bleed more easily. If you have any dental work done, tell your dentist you are receiving this medicine. What side effects may I notice from receiving this medication? Side effects that you should report to your doctor or health care professional as soon as possible: allergic reactions like skin rash, itching or hives, swelling of the face, lips, or tongue breathing problems nausea, vomiting signs and symptoms of bleeding such as bloody or black, tarry stools; red or dark brown urine; spitting up blood or brown material that looks  like coffee grounds; red spots on the skin; unusual bruising or bleeding from the eyes, gums, or nose signs and symptoms of heart failure like fast, irregular heartbeat, sudden weight gain; swelling of the ankles, feet, hands signs and symptoms of infection like fever; chills; cough; sore throat; pain or trouble passing urine signs and symptoms of kidney injury like trouble passing urine or change in the amount of urine signs and symptoms of liver injury like dark yellow or brown urine; general ill feeling or flu-like symptoms; light-colored stools; loss of appetite; nausea; right upper belly pain; unusually weak or tired; yellowing of the eyes or skin Side effects that usually do not require medical attention (report to your doctor or health care professional if they continue or are bothersome): confusion decreased hearing diarrhea facial flushing hair loss headache loss of appetite missed menstrual periods signs and symptoms of low red blood cells or anemia such as unusually weak or tired; feeling faint or lightheaded; falls skin discoloration This list may not describe all possible side effects. Call your doctor for medical advice about side effects. You may report side effects to FDA at 1-800-FDA-1088. Where should I keep my medication? This drug is given in a hospital or clinic and will not be stored at home. NOTE: This sheet is a summary. It may not cover all possible information. If you have questions about this medicine, talk to your doctor, pharmacist, or health care provider.  2023 Elsevier/Gold Standard (2021-04-28 00:00:00)   Palonosetron Injection What is this medication? PALONOSETRON (pal oh NOE se tron) prevents nausea and vomiting from chemotherapy or surgery. It works by blocking substances in your body that may cause nausea and vomiting. It belongs to a class of medications called antiemetics. This medicine may be used for other purposes; ask your health care provider or  pharmacist if you have questions. COMMON BRAND NAME(S): Aloxi What should I tell my care team before I take this medication? They need to know if you have any of these conditions: An unusual or allergic reaction to palonosetron, dolasetron, granisetron, ondansetron, other medications, foods, dyes, or preservatives Pregnant or trying to get pregnant Breast-feeding How should I use this medication? This medication is for infusion into a vein. It is given in a hospital or clinic setting. Talk to your care team about the use of this medication in children. While this medication may  be prescribed for children as young as 1 month for selected conditions, precautions do apply. Overdosage: If you think you have taken too much of this medicine contact a poison control center or emergency room at once. NOTE: This medicine is only for you. Do not share this medicine with others. What if I miss a dose? This does not apply. What may interact with this medication? Certain medications for depression, anxiety, or mental health conditions Fentanyl Linezolid MAOIs, such as Carbex, Eldepryl, Marplan, Nardil, and Parnate Methylene blue (injected into a vein) Tramadol This list may not describe all possible interactions. Give your health care provider a list of all the medicines, herbs, non-prescription drugs, or dietary supplements you use. Also tell them if you smoke, drink alcohol, or use illegal drugs. Some items may interact with your medicine. What should I watch for while using this medication? Your condition will be monitored carefully while you are receiving this medication. What side effects may I notice from receiving this medication? Side effects that you should report to your care team as soon as possible: Allergic reactions--skin rash, itching, hives, swelling of the face, lips, tongue, or throat Irritability, confusion, fast or irregular heartbeat, muscle stiffness, twitching muscles, sweating,  high fever, seizure, chills, vomiting, diarrhea, which may be signs of serotonin syndrome Side effects that usually do not require medical attention (report to your care team if they continue or are bothersome): Constipation Headache This list may not describe all possible side effects. Call your doctor for medical advice about side effects. You may report side effects to FDA at 1-800-FDA-1088. Where should I keep my medication? This medication is given in a hospital or clinic and will not be stored at home. NOTE: This sheet is a summary. It may not cover all possible information. If you have questions about this medicine, talk to your doctor, pharmacist, or health care provider.  2023 Elsevier/Gold Standard (2021-04-04 00:00:00)  Fosaprepitant Injection What is this medication? FOSAPREPITANT (fos ap RE pi tant) prevents nausea and vomiting from chemotherapy. It works by blocking substances in your body that may cause nausea and vomiting. It belongs to a class of medications called antiemetics. This medicine may be used for other purposes; ask your health care provider or pharmacist if you have questions. COMMON BRAND NAME(S): Emend What should I tell my care team before I take this medication? They need to know if you have any of these conditions: Liver disease An unusual or allergic reaction to fosaprepitant, aprepitant, medications, foods, dyes, or preservatives Pregnant or trying to get pregnant Breast-feeding How should I use this medication? This medication is for injection into a vein. It is given in a hospital or clinic setting. Talk to your care team about the use of this medication in children. While this medication may be prescribed for children as young as 6 months for selected conditions, precautions do apply. Overdosage: If you think you have taken too much of this medicine contact a poison control center or emergency room at once. NOTE: This medicine is only for you. Do not  share this medicine with others. What if I miss a dose? This does not apply. What may interact with this medication? Do not take this medication with any of these: Cisapride Flibanserin Lomitapide Pimozide This medication may also interact with the following: Diltiazem Estrogen or progestin hormones Medications for fungal infections, such as ketoconazole and itraconazole Medications for HIV Medications for seizures or to control epilepsy, such as carbamazepine or phenytoin Medications used for  sleep or anxiety disorders, such as alprazolam, diazepam, or midazolam Nefazodone Paroxetine Ranolazine Rifampin Some antibiotics, such as clarithromycin, erythromycin, troleandomycin Some chemotherapy medications, such as etoposide, ifosfamide, vinblastine, vincristine Steroid medications, such as dexamethasone or methylprednisolone Tolbutamide Warfarin This list may not describe all possible interactions. Give your health care provider a list of all the medicines, herbs, non-prescription drugs, or dietary supplements you use. Also tell them if you smoke, drink alcohol, or use illegal drugs. Some items may interact with your medicine. What should I watch for while using this medication? Do not take this medication if you already have nausea and vomiting. Ask your care team what to do if you already have nausea. Estrogen and/or progestin hormones may not work as well while you are taking this medication. A barrier contraceptive, such as a condom or diaphragm, is recommended during treatment and for up to 1 month after your last dose of fosaprepitant if you are using these hormones for contraception. Talk to your care team about other forms of contraception. This medication should not be used continuously for a long time. Visit your care team for regular check-ups. This medication may change your liver function blood test results. What side effects may I notice from receiving this  medication? Side effects that you should report to your care team as soon as possible: Allergic reactions--skin rash, itching, hives, swelling of the face, lips, tongue, or throat Side effects that usually do not require medical attention (report to your care team if they continue or are bothersome): Diarrhea Fatigue Pain, redness, or irritation at injection site Pain, tingling, or numbness in the hands or feet Unusual weakness or fatigue Upset stomach This list may not describe all possible side effects. Call your doctor for medical advice about side effects. You may report side effects to FDA at 1-800-FDA-1088. Where should I keep my medication? This medication is given in a hospital or clinic and will not be stored at home. NOTE: This sheet is a summary. It may not cover all possible information. If you have questions about this medicine, talk to your doctor, pharmacist, or health care provider.  2023 [employer] (2021-04-07 00:00:00)

## 2022-01-09 NOTE — Progress Notes (Signed)
Met with patient and daughter prior to first chemo tx today.

## 2022-01-09 NOTE — Progress Notes (Signed)
Pts niece will like to discuss the nausea medications.

## 2022-01-09 NOTE — Progress Notes (Signed)
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  Showed a irregular mass measuring 2.3 x 3.2 x 3.6 cm.  There were 2 other smaller masses in the left breast measuring 4 x 7 x 9 mm and 3 x 5 x 5 mm.  7 abnormal lymph nodes in the left axilla.  Indeterminate solid and cystic mass in the right breast in the retroareolar region measuring 1 x 1.2 x 1.9 cm as well as another 4.5 cm group of indeterminate microcalcifications in the lower right breast.  Patient had 2 left breast biopsy along with left lymph node biopsy.  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   PET CT scan showed hypermetabolic left breast mass and clusters of small but hypermetabolic left axillary and subpectoral lymph nodes.  1.2 x 0.9 cm subpleural nodule with an SUV of 1.6.  Interval history-patient is here with her niece today.  She is doing well overall.  She has baseline cognitive impairment and denies any  specific complaints at this time  ECOG PS- 1 Pain scale- 0  Review of systems- Review of Systems  Constitutional:  Negative for chills, fever, malaise/fatigue and weight loss.  HENT:  Negative for congestion, ear discharge and nosebleeds.   Eyes:  Negative for blurred vision.  Respiratory:  Negative for cough, hemoptysis, sputum production, shortness of breath and wheezing.   Cardiovascular:  Negative for chest pain, palpitations, orthopnea and claudication.  Gastrointestinal:  Negative for abdominal pain, blood in stool, constipation, diarrhea, heartburn, melena, nausea and vomiting.  Genitourinary:  Negative for dysuria, flank pain, frequency, hematuria and urgency.  Musculoskeletal:  Negative for back pain, joint pain and myalgias.  Skin:  Negative for rash.  Neurological:  Negative for dizziness, tingling, focal weakness, seizures, weakness and headaches.  Endo/Heme/Allergies:  Does not bruise/bleed easily.  Psychiatric/Behavioral:  Negative for depression and suicidal ideas. The patient does not have insomnia.       No Known Allergies   Past Medical History:  Diagnosis Date   Anemia    Anxiety    Arthritis    NECK   Cancer ([place])    BASAL CELL AND MELANOMA   DAVF (dural arteriovenous fistula) 2015   Dementia ([facility])    Family history of adverse reaction to anesthesia    niece has to come out of anesthesia slow   Hypertension    Pneumonia      Past Surgical History:  Procedure Laterality Date   BRAIN SURGERY  2015   DURAL AV FISTULA ([facility])   BREAST BIOPSY  Left 12/14/2021   [place] Bx 3:00 7 CMFN, Coil Clip, Path Pending   BREAST BIOPSY Left 12/14/2021   [place] Bx 10:00 2 CMFN, Venus Clip, path pending   breast biopsy Left 12/14/2021   [place] Axille, Hydromarker (butterfly). path pending   BREAST BIOPSY Right 12/19/2021   Stereo bx-calcs, "COIL" clip-path pending   BREAST BIOPSY Right 12/19/2021   u/s bx-mass, 3:00, retroareolar, "HEART" clip-path pending    COLONOSCOPY     COLONOSCOPY WITH PROPOFOL N/A 02/20/2017   Procedure: COLONOSCOPY WITH PROPOFOL;  Surgeon: Manya Silvas, MD;  Location: [facility] ENDOSCOPY;  Service: Endoscopy;  Laterality: N/A;   HYSTEROSCOPY WITH D & C N/A 07/29/2015   Procedure: DILATATION AND CURETTAGE /HYSTEROSCOPY;  Surgeon: Benjaman Kindler, MD;  Location: [facility] ORS;  Service: Gynecology;  Laterality: N/A;   HYSTEROSCOPY WITH D & C N/A 01/24/2018   Procedure: DILATATION AND CURETTAGE /HYSTEROSCOPY;  Surgeon: Benjaman Kindler, MD;  Location: [facility] ORS;  Service: Gynecology;  Laterality: N/A;   LAPAROSCOPY N/A 01/24/2018   Procedure: LAPAROSCOPY DIAGNOSTIC;  Surgeon: Benjaman Kindler, MD;  Location: [facility] ORS;  Service: Gynecology;  Laterality: N/A;   PERIPHERAL VASCULAR THROMBECTOMY N/A 02/29/2020   Procedure: PERIPHERAL VASCULAR THROMBECTOMY / THROMBOLYSIS WITH POSSIBLE PULMONARY THROMBECTOMY / THROMBOLYSIS;  Surgeon: Algernon Huxley, MD;  Location: [place] [facility];  Service: Cardiovascular;  Laterality: N/A;   PORTACATH PLACEMENT N/A 01/08/2022   Procedure: INSERTION PORT-A-CATH;  Surgeon: Herbert Pun, MD;  Location: [facility] ORS;  Service: General;  Laterality: N/A;   TONSILLECTOMY     AGE 27   TOTAL LAPAROSCOPIC HYSTERECTOMY WITH BILATERAL SALPINGO OOPHORECTOMY Bilateral 12/28/2019   Procedure: TOTAL LAPAROSCOPIC HYSTERECTOMY WITH BILATERAL SALPINGO OOPHORECTOMY;  Surgeon: Benjaman Kindler, MD;  Location: [facility] ORS;  Service: Gynecology;  Laterality: Bilateral;   TUBAL LIGATION      Social History   Socioeconomic History   Marital status: Divorced    Spouse name: Not on file   Number of children: Not on file   Years of education: Not on file   Highest education level: Not on file  Occupational History   Not on file  Tobacco Use   Smoking status: Former    Packs/day: 0.50    Years: 15.00    Total pack years: 7.50    Types: Cigarettes    Quit date: 07/21/2000    Years since quitting: 21.4   Smokeless  tobacco: Never  Vaping Use   Vaping Use: Never used  Substance and Sexual Activity   Alcohol use: Not Currently   Drug use: No   Sexual activity: Not Currently  Other Topics Concern   Not on file  Social History Narrative   Lives alone, caregiver 77 year old daughter   Social Determinants of Health   Financial Resource Strain: Not on file  Food Insecurity: Not on file  Transportation Needs: Not on file  Physical Activity: Not on file  Stress: Not on file  Social Connections: Not on file  Intimate Partner Violence: Not on file    Family History  Problem Relation Age of Onset   Stomach cancer Sister    Hypertension Sister    Hypertension Sister    Diabetes Niece    Hypertension Niece    Breast cancer Neg Hx      Current Outpatient Medications:    buPROPion (WELLBUTRIN XL) 150 MG 24 hr tablet, Take 150 mg by mouth every morning.  (Patient not taking: Reported on 10/11/2020), Disp: , Rfl: 6   Calcium Carbonate-Vitamin  D (OS-CAL 500 + D PO), Take 1 tablet by mouth daily.  (Patient not taking: Reported on 12/22/2021), Disp: , Rfl:    cetirizine (ZYRTEC) 10 MG tablet, Take 10 mg by mouth daily as needed for allergies.  (Patient not taking: Reported on 12/22/2021), Disp: , Rfl:    Cholecalciferol (DIALYVITE VITAMIN D 5000) 125 MCG (5000 UT) capsule, Take 5,000 Units by mouth daily. (Patient not taking: Reported on 12/22/2021), Disp: , Rfl:    dexamethasone (DECADRON) 4 MG tablet, Take 2 tablets (8 mg total) by mouth daily. Start the day after chemotherapy for 2 days., Disp: 30 tablet, Rfl: 1   docusate sodium (COLACE) 100 MG capsule, Take 1 capsule (100 mg total) by mouth 2 (two) times daily. To keep stools soft (Patient not taking: Reported on 02/29/2020), Disp: 30 capsule, Rfl: 0   estrogen, conjugated,-medroxyprogesterone (PREMPRO) 0.625-5 MG tablet, Take 1 tablet by mouth every evening.  (Patient not taking: Reported on 12/22/2021), Disp: , Rfl:    lidocaine-prilocaine (EMLA) cream,  Apply to affected area once, Disp: 30 g, Rfl: 3   LORazepam (ATIVAN) 0.5 MG tablet, Take 1 tablet (0.5 mg total) by mouth every 6 (six) hours as needed (Nausea or vomiting)., Disp: 30 tablet, Rfl: 0   Lutein 40 MG CAPS, Take 40 mg by mouth daily. (Patient not taking: Reported on 12/22/2021), Disp: , Rfl:    ondansetron (ZOFRAN) 8 MG tablet, Take 1 tablet (8 mg total) by mouth 2 (two) times daily as needed for refractory nausea / vomiting. Start on day 3 after chemo., Disp: 30 tablet, Rfl: 1   oxyCODONE (OXY IR/ROXICODONE) 5 MG immediate release tablet, Take 1 tablet (5 mg total) by mouth every 4 (four) hours as needed for severe pain. (Patient not taking: Reported on 02/29/2020), Disp: 20 tablet, Rfl: 0   oxyCODONE (ROXICODONE) 5 MG immediate release tablet, Take 1 tablet (5 mg total) by mouth every 4 (four) hours as needed for severe pain., Disp: 5 tablet, Rfl: 0   prochlorperazine (COMPAZINE) 10 MG tablet, Take 1 tablet (10 mg total) by mouth every 6 (six) hours as needed (Nausea or vomiting)., Disp: 30 tablet, Rfl: 1   rivaroxaban (XARELTO) 20 MG TABS tablet, 1 tablet (20 mg total) daily with breakfast (Patient not taking: Reported on 10/11/2020), Disp: , Rfl:    vitamin B-12 (CYANOCOBALAMIN) 1000 MCG tablet, Take 1,000 mcg by mouth daily.  (Patient not taking: Reported on 12/22/2021), Disp: , Rfl:    XARELTO 20 MG TABS tablet, TAKE 1 TABLET BY MOUTH EVERY DAY (Patient not taking: Reported on 10/11/2020), Disp: 90 tablet, Rfl: 1 No current facility-administered medications for this visit.  Facility-Administered Medications Ordered in Other Visits:    heparin lock flush 100 unit/mL, 500 Units, Intracatheter, Once PRN, Sindy Guadeloupe, MD  Physical exam:  Vitals:   01/09/22 0900  BP: (!) 150/75  Pulse: (!) 58  Resp: 16  Temp: (!) 97.5 F (36.4 C)  SpO2: 100%  Weight: 105 lb 4.8 oz (47.8 kg)   Physical Exam Constitutional:      General: She is not in acute distress. Cardiovascular:     Rate and  Rhythm: Normal rate and regular rhythm.     Heart sounds: Normal heart sounds.  Pulmonary:     Effort: Pulmonary effort is normal.     Breath sounds: Normal breath sounds.  Abdominal:     General: Bowel sounds are normal.     Palpations: Abdomen is soft.  Skin:  General: Skin is warm and dry.  Neurological:     Mental Status: She is alert and oriented to person, place, and time.         Latest Ref Rng & Units 01/09/2022    8:40 AM  CMP  Glucose 70 - 99 mg/dL 99   BUN 8 - 23 mg/dL 13   Creatinine 0.44 - 1.00 mg/dL 0.77   Sodium 135 - 145 mmol/L 141   Potassium 3.5 - 5.1 mmol/L 3.7   Chloride 98 - 111 mmol/L 108   CO2 22 - 32 mmol/L 26   Calcium 8.9 - 10.3 mg/dL 9.2   Total Protein 6.5 - 8.1 g/dL 6.9   Total Bilirubin 0.3 - 1.2 mg/dL 1.4   Alkaline Phos 38 - 126 U/L 70   AST 15 - 41 U/L 23   ALT 0 - 44 U/L 13       Latest Ref Rng & Units 01/09/2022    8:40 AM  CBC  WBC 4.0 - 10.5 K/uL 5.8   Hemoglobin 12.0 - 15.0 g/dL 14.6   Hematocrit 36.0 - 46.0 % 43.7   Platelets 150 - 400 K/uL 188     No images are attached to the encounter.  DG Chest Port 1 View  Result Date: 01/08/2022 CLINICAL DATA:  Evaluate porta catheter placement. EXAM: PORTABLE CHEST 1 VIEW COMPARISON:  10/20/2020 FINDINGS: There is a right chest wall port a catheter with tip in the distal SVC. No pneumothorax visualized. Aortic atherosclerotic calcifications. Heart size and mediastinal contours are unremarkable. No pleural effusion or edema. No airspace opacities. IMPRESSION: Satisfactory position of right chest wall port a catheter. No pneumothorax. Electronically Signed   By: Kerby Moors M.D.   On: 01/08/2022 08:46   DG C-Arm 1-60 Min-No Report  Result Date: 01/08/2022 Fluoroscopy was utilized by the requesting physician.  No radiographic interpretation.   MR Brain W Wo Contrast  Result Date: 01/04/2022 CLINICAL DATA:  76 year old female with breast cancer. Evidence of left axillary lymph node  involvement on PET-CT. History of right occipital dural AV fistula in 2014. Dizziness. Staging. EXAM: MRI HEAD WITHOUT AND WITH CONTRAST TECHNIQUE: Multiplanar, multiecho pulse sequences of the brain and surrounding structures were obtained without and with intravenous contrast. CONTRAST:  3m GADAVIST GADOBUTROL 1 MMOL/ML IV SOLN COMPARISON:  PET-CT 12/27/2021.  Head CT 08/17/2020. CTA head and neck 05/13/2013. FINDINGS: Brain: No restricted diffusion to suggest acute infarction. No midline shift, mass effect, evidence of mass lesion, ventriculomegaly, extra-axial collection or acute intracranial hemorrhage. Cervicomedullary junction and pituitary are within normal limits. No abnormal intra-axial enhancement is identified to indicate parenchymal brain metastases. However, there are several small nodular areas of dural thickening and enhancement (such as right anterior superior frontal convexity series 18 image 131 and coronal will series 19, image 17. And along the posterior right mastoid bone there is a roughly 17 mm segment of abnormal soft tissue thickening and enhancement (series 18, image 46) corresponding to abnormal bone mineralization on the recent PET-CT and head CT. However, no hypermetabolism there by PET. And furthermore, this is the area of the 2014 dural fistula. Patchy and scattered moderate for age bilateral cerebral white matter T2 and FLAIR hyperintensity including some deep white matter capsule involvement. Several chronic microhemorrhages scattered in the brain including the left thalamus (series 13 image 31). Moderate associated T2 heterogeneity throughout the bilateral deep gray nuclei, including chronic thalamic lacunar infarcts left greater than right. Brainstem and cerebellum are relatively spared. Vascular: Major  intracranial vascular flow voids are preserved, the distal right vertebral artery appears dominant. Major dural venous sinuses are enhancing and appear to be patent. Skull and  upper cervical spine: Abnormal right lateral occipital bone posterior to the mastoid as detailed above. But in general, background bone mineralization is normal. No other suspicious bone lesion identified. Cervical spine C3-C4 degeneration. Sinuses/Orbits: Negative orbits. Paranasal Visualized paranasal sinuses and mastoids are stable and well aerated. Other: Visible internal auditory structures appear normal. Negative visible scalp and face. IMPRESSION: 1. No brain parenchymal metastasis. But there are several tiny areas of nodular dural thickening at the convexities (series 18, image 110). And there is a 1.8 cm area of abnormal bone posterior to the right mastoid bone. However, benign etiology of these is favored (the right mastoid region corresponds to a 2014 dural fistula site and seemed negative on a recent PET-CT. 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 Recommend repeat Brain MRI without and with contrast in 3 months to document stability. 2. No other acute intracranial abnormality. 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 Electronically Signed   By: Genevie Ann M.D.   On: 01/04/2022 06:59   ECHOCARDIOGRAM COMPLETE  Result Date: 01/03/2022    ECHOCARDIOGRAM REPORT   Patient Name:   NAJE RICE Stencil Date of Exam: 01/03/2022 Medical Rec #:  [id number]          Height:       59.0 in Accession #:    [id number]         Weight:       104.9 lb Date of Birth:  1946/06/02          BSA:          1.402 m Patient Age:    71 years           BP:           119/76 mmHg Patient Gender: F                  HR:           89 bpm. Exam Location:  [facility] Procedure: 2D Echo, Cardiac Doppler, Color Doppler and Strain Analysis Indications:     Chemo Z09  History:         Patient has prior history of Echocardiogram examinations, most                  recent 03/01/2020. Risk Factors:Hypertension. Cancer.  Sonographer:     Sherrie Sport Referring Phys:  [id number] [facility] C Dody Smartt Diagnosing Phys: Harrell Gave End MD  Sonographer  Comments: Global longitudinal strain was attempted. IMPRESSIONS  1. Left ventricular ejection fraction, by estimation, is 55 to 60%. The left ventricle has normal function. Left ventricular endocardial border not optimally defined to evaluate regional wall motion. Left ventricular diastolic parameters are consistent with Grade I diastolic dysfunction (impaired relaxation). The average left ventricular global longitudinal strain is -18.0 %. The global longitudinal strain is normal.  2. Right ventricular systolic function is normal. The right ventricular size is normal. Tricuspid regurgitation signal is inadequate for assessing PA pressure.  3. The mitral valve is degenerative. Mild to moderate mitral valve regurgitation. No evidence of mitral stenosis.  4. The aortic valve was not well visualized. Aortic valve regurgitation is not visualized. No aortic stenosis is present.  5. The inferior vena cava is normal in size with greater than 50% respiratory variability, suggesting right atrial pressure of 3 mmHg. FINDINGS  Left Ventricle: Left  ventricular ejection fraction, by estimation, is 55 to 60%. The left ventricle has normal function. Left ventricular endocardial border not optimally defined to evaluate regional wall motion. The average left ventricular global longitudinal strain is -18.0 %. The global longitudinal strain is normal. The left ventricular internal cavity size was normal in size. There is no left ventricular hypertrophy. Left ventricular diastolic parameters are consistent with Grade I diastolic dysfunction (impaired relaxation). Right Ventricle: The right ventricular size is normal. No increase in right ventricular wall thickness. Right ventricular systolic function is normal. Tricuspid regurgitation signal is inadequate for assessing PA pressure. Left Atrium: Left atrial size was normal in size. Right Atrium: Right atrial size was normal in size. Pericardium: There is no evidence of pericardial  effusion. Mitral Valve: The mitral valve is degenerative in appearance. There is mild thickening of the mitral valve leaflet(s). Mild to moderate mitral valve regurgitation. No evidence of mitral valve stenosis. Tricuspid Valve: The tricuspid valve is not well visualized. Tricuspid valve regurgitation is trivial. Aortic Valve: The aortic valve was not well visualized. Aortic valve regurgitation is not visualized. No aortic stenosis is present. Aortic valve mean gradient measures 4.0 mmHg. Aortic valve peak gradient measures 5.0 mmHg. Aortic valve area, by VTI measures 1.92 cm. Pulmonic Valve: The pulmonic valve was not well visualized. Pulmonic valve regurgitation is not visualized. No evidence of pulmonic stenosis. Aorta: The aortic root is normal in size and structure. Pulmonary Artery: The pulmonary artery is not well seen. Venous: The inferior vena cava is normal in size with greater than 50% respiratory variability, suggesting right atrial pressure of 3 mmHg. IAS/Shunts: The interatrial septum was not well visualized.  LEFT VENTRICLE PLAX 2D LVIDd:         3.80 cm   Diastology LVIDs:         2.80 cm   LV e' medial:    4.90 cm/s LV PW:         0.88 cm   LV E/e' medial:  13.5 LV IVS:        0.90 cm   LV e' lateral:   5.66 cm/s LVOT diam:     1.90 cm   LV E/e' lateral: 11.7 LV SV:         49 LV SV Index:   35        2D Longitudinal Strain LVOT Area:     2.84 cm  2D Strain GLS Avg:     -18.0 %                           3D Volume EF:                          3D EF:        56 %                          LV EDV:       131 ml                          LV ESV:       58 ml                          LV SV:        73 ml RIGHT VENTRICLE RV Basal diam:  2.40 cm RV S prime:     10.80 cm/s TAPSE (M-mode): 2.0 cm LEFT ATRIUM             Index        RIGHT ATRIUM           Index LA diam:        2.60 cm 1.85 cm/m   RA Area:     14.30 cm LA Vol (A2C):   36.4 ml 25.96 ml/m  RA Volume:   31.10 ml  22.18 ml/m LA Vol (A4C):   36.1  ml 25.75 ml/m LA Biplane Vol: 40.4 ml 28.82 ml/m  AORTIC VALVE AV Area (Vmax):    1.85 cm AV Area (Vmean):   1.46 cm AV Area (VTI):     1.92 cm AV Vmax:           112.00 cm/s AV Vmean:          90.200 cm/s AV VTI:            0.254 m AV Peak Grad:      5.0 mmHg AV Mean Grad:      4.0 mmHg LVOT Vmax:         73.10 cm/s LVOT Vmean:        46.400 cm/s LVOT VTI:          0.172 m LVOT/AV VTI ratio: 0.68  AORTA Ao Root diam: 2.63 cm MITRAL VALVE MV Area (PHT): 4.24 cm    SHUNTS MV Decel Time: 179 msec    Systemic VTI:  0.17 m MV E velocity: 66.00 cm/s  Systemic Diam: 1.90 cm MV A velocity: 84.80 cm/s MV E/A ratio:  0.78 Christopher End MD Electronically signed by Nelva Bush MD Signature Date/Time: 01/03/2022/3:13:07 PM    Final    MR BREAST BILATERAL W WO CONTRAST INC CAD  Addendum Date: 01/02/2022   ADDENDUM REPORT: 01/02/2022 12:44 ADDENDUM: There is retraction of the left nipple with non mass enhancement extending from the biopsy proven malignancy to the base of the nipple concerning for disease involvement (series 8, image 30). Electronically Signed   By: Audie Pinto M.D.   On: 01/02/2022 12:44   Result Date: 01/02/2022 CLINICAL DATA:  76 year old female with newly diagnosed left breast invasive mammary carcinoma with positive metastatic lymph node. Patient also has a newly diagnosed left breast intraductal papilloma and right breast atypical complex fibroepithelial proliferation. EXAM: BILATERAL BREAST MRI WITH AND WITHOUT CONTRAST TECHNIQUE: Multiplanar, multisequence MR images of both breasts were obtained prior to and following the intravenous administration of 5 ml of Gadavist Three-dimensional MR images were rendered by post-processing of the original MR data on an independent workstation. The three-dimensional MR images were interpreted, and findings are reported in the following complete MRI report for this study. Three dimensional images were evaluated at the independent interpreting  workstation using the DynaCAD thin client. COMPARISON:  None available. FINDINGS: Breast composition: c. Heterogeneous fibroglandular tissue. Background parenchymal enhancement: Moderate. Right breast: There is susceptibility artifact in the central and medial retroareolar right breast at the sites of recent biopsy. In the central breast there is biopsy tract enhancement and a small amount of enhancement surrounding the biopsy cavity which is indeterminate and may represent post biopsy change. Similarly at the biopsy site in the anterior retroareolar right breast there is ill-defined small amount of enhancement measuring up to 1.2 cm which is indeterminate and could also represent post biopsy change. No additional suspicious enhancement elsewhere in the right breast. Left  breast: There is a large heterogeneous enhancing mass in the outer left breast measuring approximately 3.8 x 2.8 x 3.1 cm (series 12, image 30). There is abnormal non mass enhancement extending anteriorly from the mass by 4.4 cm (series 12, image 23). There is abnormal extending posteriorly to the pectoralis measuring 2.8 cm (series 12, image 35). There is an indeterminate small enhancing mass in the anterior slightly medial left breast measuring 4 mm (series 12, image 49). There is susceptibility artifact in the anteromedial left breast at the site of recent biopsy with no associated abnormal enhancement. Diffuse nonenhancing skin thickening in the upper outer left breast. Lymph nodes: Multiple enlarged abnormal left axillary lymph nodes (10+) including retropectoral lymph nodes. No right axillary adenopathy. Ancillary findings: Small nonenhancing T2 bright lesion in the left sternum (series 3, image 18). IMPRESSION: 1. Large heterogeneous enhancing mass consistent with known invasive malignancy in the outer left breast with abnormal non mass enhancement extending anteriorly by 4.4 cm and posteriorly to the pectoralis by 2.8 cm. 2. Indeterminate 4  mm enhancing mass in the anterior slightly medial left breast. 3. Diffuse nonenhancing skin thickening in the upper outer left breast which is nonspecific. Correlate with physical exam for inflammatory breast cancer. 4. Post biopsy changes in the central and medial retroareolar right breast with mild surrounding enhancement. 5. Multiple enlarged left axillary lymph nodes (10+) including retropectoral lymph nodes. 6. Small nonenhancing T2 bright lesion in the left sternum, favor benign. Consider correlation with bone scan. RECOMMENDATION: If breast conservation of the left breast is being considered recommend additional biopsies of the non mass enhancement extending anterior and posterior to the known malignancy. Additionally may consider biopsy of the small indeterminate mass in the anteromedial left breast. BI-RADS CATEGORY  6: Known biopsy-proven malignancy. Electronically Signed: By: Audie Pinto M.D. On: 01/02/2022 09:25   NM PET Image Initial (PI) Skull Base To Thigh  Result Date: 12/28/2021 CLINICAL DATA:  Initial treatment strategy for left breast cancer. EXAM: NUCLEAR MEDICINE PET SKULL BASE TO THIGH TECHNIQUE: 5.9 mCi F-18 FDG was injected intravenously. Full-ring PET imaging was performed from the skull base to thigh after the radiotracer. CT data was obtained and used for attenuation correction and anatomic localization. Fasting blood glucose: 85 mg/dl COMPARISON:  CT examinations from 10/20/2020 and 02/28/2020 FINDINGS: Mediastinal blood pool activity: SUV max 1.6 Liver activity: SUV max NA NECK: No significant abnormal hypermetabolic activity in this region. Incidental CT findings: none CHEST: 3.0 cm left lateral breast mass, maximum SUV 9.3, compatible with malignancy. Clustered small left axillary lymph nodes, maximum SUV 3.2, substantially above blood pool and suspicious for malignancy. Individual nodes in this cluster measure up to about 0.7 cm in diameter. There also small but faintly  hypermetabolic subpectoral lymph nodes with maximum SUV to 1.8. A 1.2 by 0.9 cm right lower lobe subpleural nodule on image 93 series 2 has a maximum SUV of 1.6. This lesion measured 1.2 by 1.1 cm on 07/22/2020 and there is an indistinct airspace opacity in this region on 02/28/2020. Incidental CT findings: Atherosclerotic calcification the aortic arch and branch vessels. ABDOMEN/PELVIS: No significant abnormal hypermetabolic activity in this region. Incidental CT findings: Atherosclerosis is present, including aortoiliac atherosclerotic disease. IVC filter noted. Sigmoid colon diverticulosis. SKELETON: No significant abnormal hypermetabolic activity in this region. Incidental CT findings: Lower cervical spondylosis IMPRESSION: 1. Hypermetabolic left breast mass compatible with malignancy. Clustered small but hypermetabolic left axillary and subpectoral lymph nodes are likely involved. 2. 1.2 by 0.9 cm right  lower lobe subpleural nodule is maximum SUV of 1.6. Possibilities include chronic inflammation or low-grade adenocarcinoma. 3. Other imaging findings of potential clinical significance: Aortic Atherosclerosis (ICD10-I70.0). Sigmoid colon diverticulosis. Lower cervical spondylosis. Electronically Signed   By: Van Clines M.D.   On: 12/28/2021 15:40   [place] RT BREAST BX W LOC DEV 1ST LESION IMG BX SPEC US GUIDE  Addendum Date: 12/21/2021   ADDENDUM REPORT: 12/21/2021 14:03 ADDENDUM: PATHOLOGY revealed: Site A. BREAST MASS, RIGHT RETROAREOLAR, 3:00 o'clock. 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 Comment: The differential diagnosis for the findings in part A includes atypical ductal hyperplasia (ADH) involving an intraductal papilloma with sclerosis. Correlation with radiographic findings is required. Pathology results are CONCORDANT with imaging findings, per Dr. Audie Pinto with excision recommended. PATHOLOGY revealed: Site B. BREAST CALCIFICATIONS,  RIGHT LOWER OUTER; STEREOTACTIC BIOPSY: - POLARIZABLE CALCIUM OXALATE. - CALCIFICATIONS ASSOCIATED WITH COLUMNAR CELL CHANGE. - DUCT ECTASIA, APOCRINE METAPLASIA, AND SCLEROSING ADENOSIS. - NEGATIVE FOR ATYPIA AND MALIGNANCY. Pathology results are CONCORDANT with imaging findings, per Dr. Audie Pinto. Pathology results and recommendations below were discussed with patient's niece and POA Maximiano Coss) by Electa Sniff RN via telephone on 12/20/2021. Margaretha Sheffield reported biopsy site within normal limits with no adverse symptoms. RECOMMENDATIONS: 1. Continue with current treatment plan for newly diagnosed LEFT breast cancer. 2. Surgical consultation for Site A - RIGHT breast. Surgical consultation relayed to Casper Harrison RN at [facility] by Electa Sniff RN on 12/20/2021. Patient met with surgeon (Dr. Herbert Pun) on 12/20/2021 per niece Maximiano Coss) report. 3. If breast conversation desired, six month follow-up mammogram on site B to ensure stability of biopsied area. Pathology results reported by Electa Sniff RN on 12/21/2021. Electronically Signed   By: Audie Pinto M.D.   On: 12/21/2021 14:03   Result Date: 12/21/2021 CLINICAL DATA:  76 year old female presenting for biopsies of the right breast. EXAM: ULTRASOUND GUIDED RIGHT BREAST CORE NEEDLE BIOPSY COMPARISON:  Previous exam(s). PROCEDURE: I met with the patient and we discussed the procedure of ultrasound-guided biopsy, including benefits and alternatives. We discussed the high likelihood of a successful procedure. We discussed the risks of the procedure, including infection, bleeding, tissue injury, clip migration, and inadequate sampling. Informed written consent was given. The usual time-out protocol was performed immediately prior to the procedure. Lesion quadrant: Lower inner quadrant Using sterile technique and 1% Lidocaine as local anesthetic, under direct ultrasound visualization, a 14 gauge spring-loaded device was used to  perform biopsy of a mass in the right breast at 3 o'clock retroareolar using a medial approach. At the conclusion of the procedure a heart shaped tissue marker clip was deployed into the biopsy cavity. Follow up 2 view mammogram was performed and dictated separately. IMPRESSION: Ultrasound guided biopsy of a mass in the right breast at 3 o'clock retroareolar. No apparent complications. Electronically Signed: By: Audie Pinto M.D. On: 12/19/2021 09:32   MM RT BREAST BX W LOC DEV 1ST LESION IMAGE BX SPEC STEREO GUIDE  Addendum Date: 12/21/2021   ADDENDUM REPORT: 12/21/2021 14:03 ADDENDUM: PATHOLOGY revealed: Site A. BREAST MASS, RIGHT RETROAREOLAR, 3:00 o'clock. 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 Comment: The differential diagnosis for the findings in part A includes atypical ductal hyperplasia (ADH) involving an intraductal papilloma with sclerosis. Correlation with radiographic findings is required. Pathology results are CONCORDANT with imaging findings, per Dr. Audie Pinto with excision recommended. PATHOLOGY revealed: Site B. BREAST CALCIFICATIONS, RIGHT LOWER OUTER; STEREOTACTIC BIOPSY: - POLARIZABLE CALCIUM  OXALATE. - CALCIFICATIONS ASSOCIATED WITH COLUMNAR CELL CHANGE. - DUCT ECTASIA, APOCRINE METAPLASIA, AND SCLEROSING ADENOSIS. - NEGATIVE FOR ATYPIA AND MALIGNANCY. Pathology results are CONCORDANT with imaging findings, per Dr. Audie Pinto. Pathology results and recommendations below were discussed with patient's niece and POA Maximiano Coss) by Electa Sniff RN via telephone on 12/20/2021. Margaretha Sheffield reported biopsy site within normal limits with no adverse symptoms. RECOMMENDATIONS: 1. Continue with current treatment plan for newly diagnosed LEFT breast cancer. 2. Surgical consultation for Site A - RIGHT breast. Surgical consultation relayed to Casper Harrison RN at [facility] by Electa Sniff RN on 12/20/2021. Patient met with  surgeon (Dr. Herbert Pun) on 12/20/2021 per niece Maximiano Coss) report. 3. If breast conversation desired, six month follow-up mammogram on site B to ensure stability of biopsied area. Pathology results reported by Electa Sniff RN on 12/21/2021. Electronically Signed   By: Audie Pinto M.D.   On: 12/21/2021 14:03   Result Date: 12/21/2021 CLINICAL DATA:  76 year old female presenting for right breast biopsies. EXAM: RIGHT BREAST STEREOTACTIC CORE NEEDLE BIOPSY COMPARISON:  Previous exam(s). FINDINGS: The patient and I discussed the procedure of stereotactic-guided biopsy including benefits and alternatives. We discussed the high likelihood of a successful procedure. We discussed the risks of the procedure including infection, bleeding, tissue injury, clip migration, and inadequate sampling. Informed written consent was given. The usual time out protocol was performed immediately prior to the procedure. Using sterile technique and 1% Lidocaine as local anesthetic, under stereotactic guidance, a 9 gauge vacuum assisted device was used to perform core needle biopsy of calcifications in the lower outer right breast using a lateral approach. Specimen radiograph was performed showing at least 3 specimens with calcifications. Specimens with calcifications are identified for pathology. Lesion quadrant: Lower outer quadrant At the conclusion of the procedure, a coil shaped tissue marker clip was deployed into the biopsy cavity. Follow-up 2-view mammogram was performed and dictated separately. IMPRESSION: Stereotactic-guided biopsy of calcifications in the lower outer right breast. No apparent complications. Electronically Signed: By: Audie Pinto M.D. On: 12/19/2021 09:33   MM CLIP PLACEMENT RIGHT  Result Date: 12/19/2021 CLINICAL DATA:  Post procedure mammogram for clip placement EXAM: 3D DIAGNOSTIC LEFT MAMMOGRAM POST ULTRASOUND AND STEREOTACTIC BIOPSY COMPARISON:  Previous exam(s). FINDINGS: 3D  Mammographic images were obtained following ultrasound guided biopsy of a mass in the right breast at 3 o'clock retroareolar. The heart biopsy marking clip is in expected position at the site of biopsy. 3D Mammographic images were obtained following stereotactic guided biopsy of calcifications in the lower outer right breast. The coil shaped biopsy marking clip appears displaced medially by approximately 3.7 cm. There are residual calcifications. IMPRESSION: Appropriate positioning of the heart shaped biopsy marking clip at the site of biopsy in the right breast at 3 o'clock retroareolar. The coil shaped biopsy marking clip appears displaced medially by approximately 3.7 cm. There are residual calcifications that could be used for localization if needed. Final Assessment: Post Procedure Mammograms for Marker Placement Electronically Signed   By: Audie Pinto M.D.   On: 12/19/2021 09:26  [place] AXILLARY NODE CORE BIOPSY LEFT  Addendum Date: 12/15/2021   ADDENDUM REPORT: 12/15/2021 16:29 ADDENDUM: PATHOLOGY revealed: Site A. 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 - NEGATIVE FOR ATYPICAL PROLIFERATIVE BREAST DISEASE. Pathology results are CONCORDANT with imaging findings, per Dr. Hassan Rowan with excision recommended. PATHOLOGY revealed: Site B. BREAST,  LEFT AT 3:00, 7 CM FROM THE NIPPLE; ULTRASOUND-GUIDED CORE NEEDLE BIOPSY: - INVASIVE MAMMARY CARCINOMA, WITH SQUAMOUS METAPLASIA AND KERATINIZATION. Size of invasive carcinoma: 10 mm in this sample. Grade 3. Ductal carcinoma in situ: Present, high-grade with comedonecrosis. Lymphovascular invasion: Not identified. 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 Definitive classification is dependent upon final excision. Pathology results are CONCORDANT with imaging findings, per Dr. Hassan Rowan.  PATHOLOGY revealed: Site C. LYMPH NODE, LEFT AXILLARY; ULTRASOUND-GUIDED CORE NEEDLE BIOPSY: - POSITIVE FOR MALIGNANCY. 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 Pathology results are CONCORDANT with imaging findings, per Dr. Hassan Rowan. Pathology results and recommendations below were discussed with patient and patient's POA Maximiano Coss) by telephone on 12/15/2021. Patient reported biopsy site within normal limits with slight tenderness at the site. Post biopsy care instructions were reviewed, questions were answered and my direct phone number was provided to patient. Patient was instructed to call [facility] if any concerns or questions arise related to the biopsy. RECOMMENDATIONS: 1. Surgical and oncological consultation. Request for surgical and oncological consultation relayed to Casper Harrison RN at [facility] by Electa Sniff RN on 12/15/2021. 2. Patient has RIGHT breast stereotactic guided biopsy scheduled for 12/19/2021. Pathology results reported by Electa Sniff RN on 12/15/2021. Electronically Signed   By: Margarette Canada M.D.   On: 12/15/2021 16:29   Result Date: 12/15/2021 CLINICAL DATA:  76 year old female for tissue sampling of a 0.5 cm UPPER INNER LEFT breast mass, a 3.6 cm OUTER LEFT breast mass, and an enlarged LEFT axillary lymph node. EXAM: ULTRASOUND GUIDED LEFT BREAST CORE NEEDLE BIOPSY X 2 [place] AXILLARY NODE CORE BIOPSY LEFT COMPARISON:  None Available. PROCEDURE: I met with the patient and we discussed the procedure of ultrasound-guided biopsy, including benefits and alternatives. We discussed the high likelihood of a successful procedure. We discussed the risks of the procedure, including infection, bleeding, tissue injury, clip migration, and inadequate sampling. Informed written consent was given. The usual time-out protocol was performed immediately prior to the procedure. ULTRASOUND GUIDED LEFT BREAST CORE NEEDLE BIOPSY #1 (0.5 cm UPPER  INNER LEFT breast mass-VENUS clip): Using sterile technique and 1% Lidocaine as local anesthetic, under direct ultrasound visualization, a 12 gauge spring-loaded device was used to perform biopsy of the 0.5 cm mass at the 10 o'clock position of the LEFT breast using a MEDIAL approach. At the conclusion of the procedure a VENUS tissue marker clip was deployed into the biopsy cavity. Follow up 2 view mammogram was performed and dictated separately. ULTRASOUND GUIDED LEFT BREAST CORE NEEDLE BIOPSY #2 (3.6 cm OUTER LEFT breast mass-COIL clip): Using sterile technique and 1% Lidocaine as local anesthetic, under direct ultrasound visualization, a 12 gauge spring-loaded device was used to perform biopsy of the 3.6 cm mass at the 3 o'clock position of the LEFT breast using a superomedial approach. At the conclusion of the procedure a COIL shaped tissue marker clip was deployed into the biopsy cavity. Follow up 2 view mammogram was performed and dictated separately. [place] AXILLARY NODE CORE BIOPSY LEFT Using sterile technique and 1% Lidocaine as local anesthetic, under direct ultrasound visualization, a 12 gauge spring-loaded device was used to perform biopsy of an abnormal appearing lymph node in the LEFT axilla using a MEDIAL approach. The lowest abnormal LEFT axillary lymph node was not targeted due to an overlying artery. At the conclusion of the procedure a HydroMARK tissue marker clip was deployed  into the biopsy cavity. Follow up 2 view mammogram was performed and dictated separately. IMPRESSION: Ultrasound guided biopsy of 0.5 cm UPPER INNER LEFT breast mass (VENUS clip). Ultrasound guided biopsy of 3.6 cm OUTER LEFT breast mass (RIBBON clip). Ultrasound-guided biopsy of an abnormal LEFT axillary lymph node. No apparent complications. Electronically Signed: By: Margarette Canada M.D. On: 12/14/2021 11:17   [place] LT BREAST BX W LOC DEV 1ST LESION IMG BX SPEC US GUIDE  Addendum Date: 12/15/2021   ADDENDUM REPORT: 12/15/2021  16:29 ADDENDUM: PATHOLOGY revealed: Site A. 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 - NEGATIVE FOR ATYPICAL PROLIFERATIVE BREAST DISEASE. Pathology results are CONCORDANT with imaging findings, per Dr. Hassan Rowan with excision recommended. PATHOLOGY revealed: Site B. BREAST, LEFT AT 3:00, 7 CM FROM THE NIPPLE; ULTRASOUND-GUIDED CORE NEEDLE BIOPSY: - INVASIVE MAMMARY CARCINOMA, WITH SQUAMOUS METAPLASIA AND KERATINIZATION. Size of invasive carcinoma: 10 mm in this sample. Grade 3. Ductal carcinoma in situ: Present, high-grade with comedonecrosis. Lymphovascular invasion: Not identified. 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 Definitive classification is dependent upon final excision. Pathology results are CONCORDANT with imaging findings, per Dr. Hassan Rowan. PATHOLOGY revealed: Site C. LYMPH NODE, LEFT AXILLARY; ULTRASOUND-GUIDED CORE NEEDLE BIOPSY: - POSITIVE FOR MALIGNANCY. 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 Pathology results are CONCORDANT with imaging findings, per Dr. Hassan Rowan. Pathology results and recommendations below were discussed with patient and patient's POA Maximiano Coss) by telephone on 12/15/2021. Patient reported biopsy site within normal limits with slight tenderness at the site. Post biopsy care instructions were reviewed, questions were answered and my direct phone number was provided to patient. Patient was instructed to call [facility] if any concerns or questions arise related to the biopsy. RECOMMENDATIONS: 1. Surgical and oncological consultation. Request for surgical and oncological consultation relayed to Casper Harrison RN at [facility] by Electa Sniff RN on 12/15/2021. 2. Patient has RIGHT breast stereotactic guided biopsy scheduled for 12/19/2021.  Pathology results reported by Electa Sniff RN on 12/15/2021. Electronically Signed   By: Margarette Canada M.D.   On: 12/15/2021 16:29   Result Date: 12/15/2021 CLINICAL DATA:  76 year old female for tissue sampling of a 0.5 cm UPPER INNER LEFT breast mass, a 3.6 cm OUTER LEFT breast mass, and an enlarged LEFT axillary lymph node. EXAM: ULTRASOUND GUIDED LEFT BREAST CORE NEEDLE BIOPSY X 2 [place] AXILLARY NODE CORE BIOPSY LEFT COMPARISON:  None Available. PROCEDURE: I met with the patient and we discussed the procedure of ultrasound-guided biopsy, including benefits and alternatives. We discussed the high likelihood of a successful procedure. We discussed the risks of the procedure, including infection, bleeding, tissue injury, clip migration, and inadequate sampling. Informed written consent was given. The usual time-out protocol was performed immediately prior to the procedure. ULTRASOUND GUIDED LEFT BREAST CORE NEEDLE BIOPSY #1 (0.5 cm UPPER INNER LEFT breast mass-VENUS clip): Using sterile technique and 1% Lidocaine as local anesthetic, under direct ultrasound visualization, a 12 gauge spring-loaded device was used to perform biopsy of the 0.5 cm mass at the 10 o'clock position of the LEFT breast using a MEDIAL approach. At the conclusion of the procedure a VENUS tissue marker clip was deployed into the biopsy cavity. Follow up 2 view mammogram was performed and dictated separately. ULTRASOUND GUIDED LEFT BREAST CORE NEEDLE BIOPSY #2 (3.6 cm OUTER LEFT breast  mass-COIL clip): Using sterile technique and 1% Lidocaine as local anesthetic, under direct ultrasound visualization, a 12 gauge spring-loaded device was used to perform biopsy of the 3.6 cm mass at the 3 o'clock position of the LEFT breast using a superomedial approach. At the conclusion of the procedure a COIL shaped tissue marker clip was deployed into the biopsy cavity. Follow up 2 view mammogram was performed and dictated separately. [place] AXILLARY NODE CORE BIOPSY  LEFT Using sterile technique and 1% Lidocaine as local anesthetic, under direct ultrasound visualization, a 12 gauge spring-loaded device was used to perform biopsy of an abnormal appearing lymph node in the LEFT axilla using a MEDIAL approach. The lowest abnormal LEFT axillary lymph node was not targeted due to an overlying artery. At the conclusion of the procedure a HydroMARK tissue marker clip was deployed into the biopsy cavity. Follow up 2 view mammogram was performed and dictated separately. IMPRESSION: Ultrasound guided biopsy of 0.5 cm UPPER INNER LEFT breast mass (VENUS clip). Ultrasound guided biopsy of 3.6 cm OUTER LEFT breast mass (RIBBON clip). Ultrasound-guided biopsy of an abnormal LEFT axillary lymph node. No apparent complications. Electronically Signed: By: Margarette Canada M.D. On: 12/14/2021 11:17   [place] LT BREAST BX W LOC DEV EA ADD LESION IMG BX SPEC US GUIDE  Addendum Date: 12/15/2021   ADDENDUM REPORT: 12/15/2021 16:29 ADDENDUM: PATHOLOGY revealed: Site A. 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 - NEGATIVE FOR ATYPICAL PROLIFERATIVE BREAST DISEASE. Pathology results are CONCORDANT with imaging findings, per Dr. Hassan Rowan with excision recommended. PATHOLOGY revealed: Site B. BREAST, LEFT AT 3:00, 7 CM FROM THE NIPPLE; ULTRASOUND-GUIDED CORE NEEDLE BIOPSY: - INVASIVE MAMMARY CARCINOMA, WITH SQUAMOUS METAPLASIA AND KERATINIZATION. Size of invasive carcinoma: 10 mm in this sample. Grade 3. Ductal carcinoma in situ: Present, high-grade with comedonecrosis. Lymphovascular invasion: Not identified. 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 Definitive classification is dependent upon final excision. Pathology results are CONCORDANT with imaging findings, per Dr. Hassan Rowan. PATHOLOGY revealed: Site C. LYMPH NODE, LEFT AXILLARY;  ULTRASOUND-GUIDED CORE NEEDLE BIOPSY: - POSITIVE FOR MALIGNANCY. 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 Pathology results are CONCORDANT with imaging findings, per Dr. Hassan Rowan. Pathology results and recommendations below were discussed with patient and patient's POA Maximiano Coss) by telephone on 12/15/2021. Patient reported biopsy site within normal limits with slight tenderness at the site. Post biopsy care instructions were reviewed, questions were answered and my direct phone number was provided to patient. Patient was instructed to call [facility] if any concerns or questions arise related to the biopsy. RECOMMENDATIONS: 1. Surgical and oncological consultation. Request for surgical and oncological consultation relayed to Casper Harrison RN at [facility] by Electa Sniff RN on 12/15/2021. 2. Patient has RIGHT breast stereotactic guided biopsy scheduled for 12/19/2021. Pathology results reported by Electa Sniff RN on 12/15/2021. Electronically Signed   By: Margarette Canada M.D.   On: 12/15/2021 16:29   Result Date: 12/15/2021 CLINICAL DATA:  76 year old female for tissue sampling of a 0.5 cm UPPER INNER LEFT breast mass, a 3.6 cm OUTER LEFT breast mass, and an enlarged LEFT axillary lymph node. EXAM: ULTRASOUND GUIDED LEFT BREAST CORE NEEDLE BIOPSY X 2 [place] AXILLARY NODE CORE BIOPSY LEFT COMPARISON:  None Available. PROCEDURE: I met with the patient and we discussed the procedure of ultrasound-guided biopsy,  including benefits and alternatives. We discussed the high likelihood of a successful procedure. We discussed the risks of the procedure, including infection, bleeding, tissue injury, clip migration, and inadequate sampling. Informed written consent was given. The usual time-out protocol was performed immediately prior to the procedure. ULTRASOUND GUIDED LEFT BREAST CORE NEEDLE BIOPSY #1 (0.5 cm UPPER INNER LEFT breast mass-VENUS clip): Using sterile  technique and 1% Lidocaine as local anesthetic, under direct ultrasound visualization, a 12 gauge spring-loaded device was used to perform biopsy of the 0.5 cm mass at the 10 o'clock position of the LEFT breast using a MEDIAL approach. At the conclusion of the procedure a VENUS tissue marker clip was deployed into the biopsy cavity. Follow up 2 view mammogram was performed and dictated separately. ULTRASOUND GUIDED LEFT BREAST CORE NEEDLE BIOPSY #2 (3.6 cm OUTER LEFT breast mass-COIL clip): Using sterile technique and 1% Lidocaine as local anesthetic, under direct ultrasound visualization, a 12 gauge spring-loaded device was used to perform biopsy of the 3.6 cm mass at the 3 o'clock position of the LEFT breast using a superomedial approach. At the conclusion of the procedure a COIL shaped tissue marker clip was deployed into the biopsy cavity. Follow up 2 view mammogram was performed and dictated separately. [place] AXILLARY NODE CORE BIOPSY LEFT Using sterile technique and 1% Lidocaine as local anesthetic, under direct ultrasound visualization, a 12 gauge spring-loaded device was used to perform biopsy of an abnormal appearing lymph node in the LEFT axilla using a MEDIAL approach. The lowest abnormal LEFT axillary lymph node was not targeted due to an overlying artery. At the conclusion of the procedure a HydroMARK tissue marker clip was deployed into the biopsy cavity. Follow up 2 view mammogram was performed and dictated separately. IMPRESSION: Ultrasound guided biopsy of 0.5 cm UPPER INNER LEFT breast mass (VENUS clip). Ultrasound guided biopsy of 3.6 cm OUTER LEFT breast mass (RIBBON clip). Ultrasound-guided biopsy of an abnormal LEFT axillary lymph node. No apparent complications. Electronically Signed: By: Margarette Canada M.D. On: 12/14/2021 11:17   MM CLIP PLACEMENT LEFT  Result Date: 12/14/2021 CLINICAL DATA:  Evaluate placement of VENUS, COIL and HydroMARK biopsy clips following ultrasound-guided LEFT breast and  axillary biopsies. EXAM: 3D DIAGNOSTIC LEFT MAMMOGRAM POST ULTRASOUND BIOPSY COMPARISON:  Previous exam(s). FINDINGS: 3D Mammographic images of the LEFT breast were obtained following ultrasound guided biopsy of the 0.5 cm mass at the 10 o'clock position (VENUS clip), the 3.6 cm mass at the 3 o'clock position (COIL clip) and an abnormal LEFT axillary lymph node (HydroMARK clip). The VENUS biopsy marking clip is in expected position at the site of biopsy. The COIL biopsy marking clip is in expected position at the site of biopsy. [facility] clip is in expected position at the site of biopsy. IMPRESSION: Appropriate positioning of the VENUS shaped biopsy marking clip at the site of biopsy in the UPPER INNER LEFT breast. Appropriate positioning of the COIL shaped biopsy marking clip at the site of biopsy in the OUTER LEFT breast. Appropriate positioning of the [facility] biopsy marking clip at the site of biopsy in the LEFT axilla. Final Assessment: Post Procedure Mammograms for Marker Placement Electronically Signed   By: Margarette Canada M.D.   On: 12/14/2021 11:37    Assessment and plan- Patient is a 76 y.o. 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  She is here for on treatment assessment prior to  cycle 1 of neoadjuvant AC Keytruda chemotherapy  Baseline echocardiogram was normal.  Also discussed the results of MRI bilateral breast which showsLocally advanced disease in her left breast as well as local regional adenopathy but no overt abnormal findings in her right breast.  However her right breast biopsy for abnormal mammogram did show atypical complex proliferation with sclerosis.  She is therefore leaning towards bilateral mastectomy at this time without reconstruction  Patient has stage III triple negative breast cancer and will proceed with cycle 1 of AC Keytruda chemotherapy today with Udenyca on  day 3.  Return to clinic in 10 days with labs for possible fluids and see covering NP and I will see her back in 3 weeks for cycle 2 of AC Keytruda chemotherapy.   Visit Diagnosis 1. Malignant neoplasm of upper-outer quadrant of left breast in female, estrogen receptor negative ([place])   2. Encounter for antineoplastic chemotherapy      Dr. Randa Evens, MD, MPH [facility] [phone] 01/09/2022 4:14 PM

## 2022-01-10 ENCOUNTER — Other Ambulatory Visit: Payer: Self-pay

## 2022-01-10 ENCOUNTER — Encounter: Payer: Self-pay | Admitting: Oncology

## 2022-01-10 LAB — T4: T4, Total: 8.1 ug/dL (ref 4.5–12.0)

## 2022-01-11 ENCOUNTER — Inpatient Hospital Stay: Payer: PPO

## 2022-01-11 DIAGNOSIS — Z5112 Encounter for antineoplastic immunotherapy: Secondary | ICD-10-CM | POA: Diagnosis not present

## 2022-01-11 DIAGNOSIS — C50412 Malignant neoplasm of upper-outer quadrant of left female breast: Secondary | ICD-10-CM

## 2022-01-11 MED ORDER — PEGFILGRASTIM-CBQV 6 MG/0.6ML ~~LOC~~ SOSY
6.0000 mg | PREFILLED_SYRINGE | Freq: Once | SUBCUTANEOUS | Status: AC
  Administered 2022-01-11: 6 mg via SUBCUTANEOUS
  Filled 2022-01-11: qty 0.6

## 2022-01-19 ENCOUNTER — Inpatient Hospital Stay: Payer: PPO

## 2022-01-19 ENCOUNTER — Inpatient Hospital Stay (HOSPITAL_BASED_OUTPATIENT_CLINIC_OR_DEPARTMENT_OTHER): Payer: PPO | Admitting: Medical Oncology

## 2022-01-19 ENCOUNTER — Encounter: Payer: Self-pay | Admitting: Medical Oncology

## 2022-01-19 VITALS — BP 108/75 | HR 82 | Temp 98.5°F | Resp 16 | Ht 59.0 in | Wt 102.7 lb

## 2022-01-19 DIAGNOSIS — C50412 Malignant neoplasm of upper-outer quadrant of left female breast: Secondary | ICD-10-CM | POA: Diagnosis not present

## 2022-01-19 DIAGNOSIS — Z171 Estrogen receptor negative status [ER-]: Secondary | ICD-10-CM

## 2022-01-19 DIAGNOSIS — Z5112 Encounter for antineoplastic immunotherapy: Secondary | ICD-10-CM | POA: Diagnosis not present

## 2022-01-19 LAB — CBC WITH DIFFERENTIAL/PLATELET
Abs Immature Granulocytes: 1.11 10*3/uL — ABNORMAL HIGH (ref 0.00–0.07)
Basophils Absolute: 0.1 10*3/uL (ref 0.0–0.1)
Basophils Relative: 1 %
Eosinophils Absolute: 0.1 10*3/uL (ref 0.0–0.5)
Eosinophils Relative: 1 %
HCT: 41.2 % (ref 36.0–46.0)
Hemoglobin: 13.6 g/dL (ref 12.0–15.0)
Immature Granulocytes: 10 %
Lymphocytes Relative: 9 %
Lymphs Abs: 1 10*3/uL (ref 0.7–4.0)
MCH: 30.8 pg (ref 26.0–34.0)
MCHC: 33 g/dL (ref 30.0–36.0)
MCV: 93.2 fL (ref 80.0–100.0)
Monocytes Absolute: 0.9 10*3/uL (ref 0.1–1.0)
Monocytes Relative: 8 %
Neutro Abs: 8.1 10*3/uL — ABNORMAL HIGH (ref 1.7–7.7)
Neutrophils Relative %: 71 %
Platelets: 83 10*3/uL — ABNORMAL LOW (ref 150–400)
RBC: 4.42 MIL/uL (ref 3.87–5.11)
RDW: 11.8 % (ref 11.5–15.5)
Smear Review: NORMAL
WBC: 11.1 10*3/uL — ABNORMAL HIGH (ref 4.0–10.5)
nRBC: 0 % (ref 0.0–0.2)

## 2022-01-19 LAB — BASIC METABOLIC PANEL
Anion gap: 11 (ref 5–15)
BUN: 12 mg/dL (ref 8–23)
CO2: 24 mmol/L (ref 22–32)
Calcium: 8.8 mg/dL — ABNORMAL LOW (ref 8.9–10.3)
Chloride: 103 mmol/L (ref 98–111)
Creatinine, Ser: 0.95 mg/dL (ref 0.44–1.00)
GFR, Estimated: >60 mL/min (ref >60)
Glucose, Bld: 105 mg/dL — ABNORMAL HIGH (ref 70–99)
Potassium: 3.8 mmol/L (ref 3.5–5.1)
Sodium: 138 mmol/L (ref 135–145)

## 2022-01-19 MED ORDER — SODIUM CHLORIDE 0.9% FLUSH
10.0000 mL | Freq: Once | INTRAVENOUS | Status: AC
  Administered 2022-01-19: 10 mL via INTRAVENOUS
  Filled 2022-01-19: qty 10

## 2022-01-19 MED ORDER — HEPARIN SOD (PORK) LOCK FLUSH 100 UNIT/ML IV SOLN
500.0000 [IU] | Freq: Once | INTRAVENOUS | Status: AC
  Administered 2022-01-19: 500 [IU] via INTRAVENOUS
  Filled 2022-01-19: qty 5

## 2022-01-19 NOTE — Progress Notes (Signed)
Per Nelwyn Salisbury, no IV fluids today. Port deaccessed.

## 2022-01-19 NOTE — Progress Notes (Signed)
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  Showed a irregular mass measuring 2.3 x 3.2 x 3.6 cm.  There were 2 other smaller masses in the left breast measuring 4 x 7 x 9 mm and 3 x 5 x 5 mm.  7 abnormal lymph nodes in the left axilla.  Indeterminate solid and cystic mass in the right breast in the retroareolar region measuring 1 x 1.2 x 1.9 cm as well as another 4.5 cm group of indeterminate microcalcifications in the lower right breast.  Patient had 2 left breast biopsy along with left lymph node biopsy.  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   PET CT scan showed hypermetabolic left breast mass and clusters of small but hypermetabolic left axillary and subpectoral lymph nodes.  1.2 x 0.9 cm subpleural nodule with an SUV of 1.6.  Interval history- She is here with her niece today who helps manage her care. She is doing well overall. Tolerated her first cycle of  chemotherapy well. She is eating/drinking well. No side effects from chemotherapy. No fevers, N/V/D/C.   ECOG PS- 1 Pain scale- 0  Review of systems- Review of Systems  Constitutional:  Negative for chills, fever, malaise/fatigue and weight loss.  HENT:  Negative for congestion, ear discharge and nosebleeds.   Eyes:  Negative for blurred vision.  Respiratory:  Negative for cough, hemoptysis, sputum production, shortness of breath and wheezing.   Cardiovascular:  Negative for chest pain, palpitations, orthopnea and claudication.  Gastrointestinal:  Negative for abdominal pain, blood in stool, constipation, diarrhea, heartburn, melena, nausea and vomiting.  Genitourinary:  Negative for dysuria, flank pain, frequency, hematuria and urgency.  Musculoskeletal:  Negative for back pain, joint pain and myalgias.  Skin:  Negative for rash.  Neurological:  Negative for dizziness, tingling, focal weakness, seizures, weakness and headaches.  Endo/Heme/Allergies:  Does not bruise/bleed easily.  Psychiatric/Behavioral:  Negative for depression and suicidal ideas. The patient does not have insomnia.       No Known Allergies   Past Medical History:  Diagnosis Date   Anemia    Anxiety    Arthritis    NECK   Cancer ([place])    BASAL CELL AND MELANOMA   DAVF (dural arteriovenous fistula) 2015   Dementia ([facility])    Family history of adverse reaction to anesthesia    niece has to come out of anesthesia slow   Hypertension    Pneumonia      Past Surgical History:  Procedure Laterality Date   BRAIN SURGERY  2015  DURAL AV FISTULA (Cataract [facility])   BREAST BIOPSY Left 12/14/2021   [place] Bx 3:00 7 CMFN, Coil Clip, Path Pending   BREAST BIOPSY Left 12/14/2021   [place] Bx 10:00 2 CMFN, Venus Clip, path pending   breast biopsy Left 12/14/2021   [place] Axille, Hydromarker (butterfly). path pending   BREAST BIOPSY Right 12/19/2021   Stereo bx-calcs, "COIL" clip-path pending   BREAST BIOPSY Right 12/19/2021    u/s bx-mass, 3:00, retroareolar, "HEART" clip-path pending   COLONOSCOPY     COLONOSCOPY WITH PROPOFOL N/A 02/20/2017   Procedure: COLONOSCOPY WITH PROPOFOL;  Surgeon: Manya Silvas, MD;  Location: [facility] ENDOSCOPY;  Service: Endoscopy;  Laterality: N/A;   HYSTEROSCOPY WITH D & C N/A 07/29/2015   Procedure: DILATATION AND CURETTAGE /HYSTEROSCOPY;  Surgeon: Benjaman Kindler, MD;  Location: [facility] ORS;  Service: Gynecology;  Laterality: N/A;   HYSTEROSCOPY WITH D & C N/A 01/24/2018   Procedure: DILATATION AND CURETTAGE /HYSTEROSCOPY;  Surgeon: Benjaman Kindler, MD;  Location: [facility] ORS;  Service: Gynecology;  Laterality: N/A;   LAPAROSCOPY N/A 01/24/2018   Procedure: LAPAROSCOPY DIAGNOSTIC;  Surgeon: Benjaman Kindler, MD;  Location: [facility] ORS;  Service: Gynecology;  Laterality: N/A;   PERIPHERAL VASCULAR THROMBECTOMY N/A 02/29/2020   Procedure: PERIPHERAL VASCULAR THROMBECTOMY / THROMBOLYSIS WITH POSSIBLE PULMONARY THROMBECTOMY / THROMBOLYSIS;  Surgeon: Algernon Huxley, MD;  Location: [place] CV LAB;  Service: Cardiovascular;  Laterality: N/A;   PORTACATH PLACEMENT N/A 01/08/2022   Procedure: INSERTION PORT-A-CATH;  Surgeon: Herbert Pun, MD;  Location: [facility] ORS;  Service: General;  Laterality: N/A;   TONSILLECTOMY     AGE 36   TOTAL LAPAROSCOPIC HYSTERECTOMY WITH BILATERAL SALPINGO OOPHORECTOMY Bilateral 12/28/2019   Procedure: TOTAL LAPAROSCOPIC HYSTERECTOMY WITH BILATERAL SALPINGO OOPHORECTOMY;  Surgeon: Benjaman Kindler, MD;  Location: [facility] ORS;  Service: Gynecology;  Laterality: Bilateral;   TUBAL LIGATION      Social History   Socioeconomic History   Marital status: Divorced    Spouse name: Not on file   Number of children: Not on file   Years of education: Not on file   Highest education level: Not on file  Occupational History   Not on file  Tobacco Use   Smoking status: Former    Packs/day: 0.50    Years: 15.00    Total pack years: 7.50    Types: Cigarettes    Quit  date: 07/21/2000    Years since quitting: 21.5   Smokeless tobacco: Never  Vaping Use   Vaping Use: Never used  Substance and Sexual Activity   Alcohol use: Not Currently   Drug use: No   Sexual activity: Not Currently  Other Topics Concern   Not on file  Social History Narrative   Lives alone, caregiver 80 year old daughter   Social Determinants of Health   Financial Resource Strain: Not on file  Food Insecurity: Not on file  Transportation Needs: Not on file  Physical Activity: Not on file  Stress: Not on file  Social Connections: Not on file  Intimate Partner Violence: Not on file    Family History  Problem Relation Age of Onset   Stomach cancer Sister    Hypertension Sister    Hypertension Sister    Diabetes Niece    Hypertension Niece    Breast cancer Neg Hx      Current Outpatient Medications:    buPROPion (WELLBUTRIN XL) 150 MG 24 hr tablet, Take 150 mg by mouth every morning., Disp: , Rfl: 6  cetirizine (ZYRTEC) 10 MG tablet, Take 10 mg by mouth daily as needed for allergies., Disp: , Rfl:    dexamethasone (DECADRON) 4 MG tablet, Take 2 tablets (8 mg total) by mouth daily. Start the day after chemotherapy for 2 days., Disp: 30 tablet, Rfl: 1   lidocaine-prilocaine (EMLA) cream, Apply to affected area once, Disp: 30 g, Rfl: 3   LORazepam (ATIVAN) 0.5 MG tablet, Take 1 tablet (0.5 mg total) by mouth every 6 (six) hours as needed (Nausea or vomiting)., Disp: 30 tablet, Rfl: 0   ondansetron (ZOFRAN) 8 MG tablet, Take 1 tablet (8 mg total) by mouth 2 (two) times daily as needed for refractory nausea / vomiting. Start on day 3 after chemo., Disp: 30 tablet, Rfl: 1   polyethylene glycol (MIRALAX / GLYCOLAX) 17 g packet, Take 17 g by mouth daily., Disp: , Rfl:    prochlorperazine (COMPAZINE) 10 MG tablet, Take 1 tablet (10 mg total) by mouth every 6 (six) hours as needed (Nausea or vomiting)., Disp: 30 tablet, Rfl: 1   Calcium Carbonate-Vitamin D (OS-CAL 500 + D PO), Take  1 tablet by mouth daily.  (Patient not taking: Reported on 12/22/2021), Disp: , Rfl:    Cholecalciferol (DIALYVITE VITAMIN D 5000) 125 MCG (5000 UT) capsule, Take 5,000 Units by mouth daily. (Patient not taking: Reported on 12/22/2021), Disp: , Rfl:    docusate sodium (COLACE) 100 MG capsule, Take 1 capsule (100 mg total) by mouth 2 (two) times daily. To keep stools soft (Patient not taking: Reported on 02/29/2020), Disp: 30 capsule, Rfl: 0   estrogen, conjugated,-medroxyprogesterone (PREMPRO) 0.625-5 MG tablet, Take 1 tablet by mouth every evening.  (Patient not taking: Reported on 12/22/2021), Disp: , Rfl:    Lutein 40 MG CAPS, Take 40 mg by mouth daily. (Patient not taking: Reported on 12/22/2021), Disp: , Rfl:    oxyCODONE (OXY IR/ROXICODONE) 5 MG immediate release tablet, Take 1 tablet (5 mg total) by mouth every 4 (four) hours as needed for severe pain. (Patient not taking: Reported on 02/29/2020), Disp: 20 tablet, Rfl: 0   oxyCODONE (ROXICODONE) 5 MG immediate release tablet, Take 1 tablet (5 mg total) by mouth every 4 (four) hours as needed for severe pain. (Patient not taking: Reported on 01/19/2022), Disp: 5 tablet, Rfl: 0   rivaroxaban (XARELTO) 20 MG TABS tablet, 1 tablet (20 mg total) daily with breakfast (Patient not taking: Reported on 01/19/2022), Disp: , Rfl:    vitamin B-12 (CYANOCOBALAMIN) 1000 MCG tablet, Take 1,000 mcg by mouth daily.  (Patient not taking: Reported on 12/22/2021), Disp: , Rfl:    XARELTO 20 MG TABS tablet, TAKE 1 TABLET BY MOUTH EVERY DAY (Patient not taking: Reported on 10/11/2020), Disp: 90 tablet, Rfl: 1  Physical exam:  Vitals:   01/19/22 1035  BP: 108/75  Pulse: 82  Resp: 16  Temp: 98.5 F (36.9 C)  TempSrc: Tympanic  SpO2: 100%  Weight: 102 lb 11.2 oz (46.6 kg)  Height: 4' 11"  (1.499 m)   Physical Exam Constitutional:      General: She is not in acute distress. Cardiovascular:     Rate and Rhythm: Normal rate and regular rhythm.     Heart sounds: Normal  heart sounds.  Pulmonary:     Effort: Pulmonary effort is normal.     Breath sounds: Normal breath sounds.  Abdominal:     General: Bowel sounds are normal.     Palpations: Abdomen is soft.  Skin:    General: Skin is warm  and dry.  Neurological:     Mental Status: She is alert and oriented to person, place, and time.         Latest Ref Rng & Units 01/19/2022   10:07 AM  CMP  Glucose 70 - 99 mg/dL 105   BUN 8 - 23 mg/dL 12   Creatinine 0.44 - 1.00 mg/dL 0.95   Sodium 135 - 145 mmol/L 138   Potassium 3.5 - 5.1 mmol/L 3.8   Chloride 98 - 111 mmol/L 103   CO2 22 - 32 mmol/L 24   Calcium 8.9 - 10.3 mg/dL 8.8       Latest Ref Rng & Units 01/19/2022   10:07 AM  CBC  WBC 4.0 - 10.5 K/uL 11.1   Hemoglobin 12.0 - 15.0 g/dL 13.6   Hematocrit 36.0 - 46.0 % 41.2   Platelets 150 - 400 K/uL 83     No images are attached to the encounter.  DG Chest Port 1 View  Result Date: 01/08/2022 CLINICAL DATA:  Evaluate porta catheter placement. EXAM: PORTABLE CHEST 1 VIEW COMPARISON:  10/20/2020 FINDINGS: There is a right chest wall port a catheter with tip in the distal SVC. No pneumothorax visualized. Aortic atherosclerotic calcifications. Heart size and mediastinal contours are unremarkable. No pleural effusion or edema. No airspace opacities. IMPRESSION: Satisfactory position of right chest wall port a catheter. No pneumothorax. Electronically Signed   By: Kerby Moors M.D.   On: 01/08/2022 08:46   DG C-Arm 1-60 Min-No Report  Result Date: 01/08/2022 Fluoroscopy was utilized by the requesting physician.  No radiographic interpretation.   MR Brain W Wo Contrast  Result Date: 01/04/2022 CLINICAL DATA:  76 year old female with breast cancer. Evidence of left axillary lymph node involvement on PET-CT. History of right occipital dural AV fistula in 2014. Dizziness. Staging. EXAM: MRI HEAD WITHOUT AND WITH CONTRAST TECHNIQUE: Multiplanar, multiecho pulse sequences of the brain and surrounding  structures were obtained without and with intravenous contrast. CONTRAST:  85mL GADAVIST GADOBUTROL 1 MMOL/ML IV SOLN COMPARISON:  PET-CT 12/27/2021.  Head CT 08/17/2020. CTA head and neck 05/13/2013. FINDINGS: Brain: No restricted diffusion to suggest acute infarction. No midline shift, mass effect, evidence of mass lesion, ventriculomegaly, extra-axial collection or acute intracranial hemorrhage. Cervicomedullary junction and pituitary are within normal limits. No abnormal intra-axial enhancement is identified to indicate parenchymal brain metastases. However, there are several small nodular areas of dural thickening and enhancement (such as right anterior superior frontal convexity series 18 image 131 and coronal will series 19, image 17. And along the posterior right mastoid bone there is a roughly 17 mm segment of abnormal soft tissue thickening and enhancement (series 18, image 46) corresponding to abnormal bone mineralization on the recent PET-CT and head CT. However, no hypermetabolism there by PET. And furthermore, this is the area of the 2014 dural fistula. Patchy and scattered moderate for age bilateral cerebral white matter T2 and FLAIR hyperintensity including some deep white matter capsule involvement. Several chronic microhemorrhages scattered in the brain including the left thalamus (series 13 image 31). Moderate associated T2 heterogeneity throughout the bilateral deep gray nuclei, including chronic thalamic lacunar infarcts left greater than right. Brainstem and cerebellum are relatively spared. Vascular: Major intracranial vascular flow voids are preserved, the distal right vertebral artery appears dominant. Major dural venous sinuses are enhancing and appear to be patent. Skull and upper cervical spine: Abnormal right lateral occipital bone posterior to the mastoid as detailed above. But in general, background bone mineralization is normal.  No other suspicious bone lesion identified. Cervical  spine C3-C4 degeneration. Sinuses/Orbits: Negative orbits. Paranasal Visualized paranasal sinuses and mastoids are stable and well aerated. Other: Visible internal auditory structures appear normal. Negative visible scalp and face. IMPRESSION: 1. No brain parenchymal metastasis. But there are several tiny areas of nodular dural thickening at the convexities (series 18, image 110). And there is a 1.8 cm area of abnormal bone posterior to the right mastoid bone. However, benign etiology of these is favored (the right mastoid region corresponds to a 2014 dural fistula site and seemed negative on a recent PET-CT. 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 Recommend repeat Brain MRI without and with contrast in 3 months to document stability. 2. No other acute intracranial abnormality. 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 Electronically Signed   By: Genevie Ann M.D.   On: 01/04/2022 06:59   ECHOCARDIOGRAM COMPLETE  Result Date: 01/03/2022    ECHOCARDIOGRAM REPORT   Patient Name:   CHERITY BLICKENSTAFF Lefebre Date of Exam: 01/03/2022 Medical Rec #:  [id number]          Height:       59.0 in Accession #:    [id number]         Weight:       104.9 lb Date of Birth:  Jan 09, 1946          BSA:          1.402 m Patient Age:    11 years           BP:           119/76 mmHg Patient Gender: F                  HR:           89 bpm. Exam Location:  [facility] Procedure: 2D Echo, Cardiac Doppler, Color Doppler and Strain Analysis Indications:     Chemo Z09  History:         Patient has prior history of Echocardiogram examinations, most                  recent 03/01/2020. Risk Factors:Hypertension. Cancer.  Sonographer:     Sherrie Sport Referring Phys:  [id number] [facility] C RAO Diagnosing Phys: Harrell Gave End MD  Sonographer Comments: Global longitudinal strain was attempted. IMPRESSIONS  1. Left ventricular ejection fraction, by estimation, is 55 to 60%. The left ventricle has normal function. Left ventricular endocardial border not  optimally defined to evaluate regional wall motion. Left ventricular diastolic parameters are consistent with Grade I diastolic dysfunction (impaired relaxation). The average left ventricular global longitudinal strain is -18.0 %. The global longitudinal strain is normal.  2. Right ventricular systolic function is normal. The right ventricular size is normal. Tricuspid regurgitation signal is inadequate for assessing PA pressure.  3. The mitral valve is degenerative. Mild to moderate mitral valve regurgitation. No evidence of mitral stenosis.  4. The aortic valve was not well visualized. Aortic valve regurgitation is not visualized. No aortic stenosis is present.  5. The inferior vena cava is normal in size with greater than 50% respiratory variability, suggesting right atrial pressure of 3 mmHg. FINDINGS  Left Ventricle: Left ventricular ejection fraction, by estimation, is 55 to 60%. The left ventricle has normal function. Left ventricular endocardial border not optimally defined to evaluate regional wall motion. The average left ventricular global longitudinal strain is -18.0 %. The global longitudinal strain is normal. The left ventricular internal cavity size  was normal in size. There is no left ventricular hypertrophy. Left ventricular diastolic parameters are consistent with Grade I diastolic dysfunction (impaired relaxation). Right Ventricle: The right ventricular size is normal. No increase in right ventricular wall thickness. Right ventricular systolic function is normal. Tricuspid regurgitation signal is inadequate for assessing PA pressure. Left Atrium: Left atrial size was normal in size. Right Atrium: Right atrial size was normal in size. Pericardium: There is no evidence of pericardial effusion. Mitral Valve: The mitral valve is degenerative in appearance. There is mild thickening of the mitral valve leaflet(s). Mild to moderate mitral valve regurgitation. No evidence of mitral valve stenosis.  Tricuspid Valve: The tricuspid valve is not well visualized. Tricuspid valve regurgitation is trivial. Aortic Valve: The aortic valve was not well visualized. Aortic valve regurgitation is not visualized. No aortic stenosis is present. Aortic valve mean gradient measures 4.0 mmHg. Aortic valve peak gradient measures 5.0 mmHg. Aortic valve area, by VTI measures 1.92 cm. Pulmonic Valve: The pulmonic valve was not well visualized. Pulmonic valve regurgitation is not visualized. No evidence of pulmonic stenosis. Aorta: The aortic root is normal in size and structure. Pulmonary Artery: The pulmonary artery is not well seen. Venous: The inferior vena cava is normal in size with greater than 50% respiratory variability, suggesting right atrial pressure of 3 mmHg. IAS/Shunts: The interatrial septum was not well visualized.  LEFT VENTRICLE PLAX 2D LVIDd:         3.80 cm   Diastology LVIDs:         2.80 cm   LV e' medial:    4.90 cm/s LV PW:         0.88 cm   LV E/e' medial:  13.5 LV IVS:        0.90 cm   LV e' lateral:   5.66 cm/s LVOT diam:     1.90 cm   LV E/e' lateral: 11.7 LV SV:         49 LV SV Index:   35        2D Longitudinal Strain LVOT Area:     2.84 cm  2D Strain GLS Avg:     -18.0 %                           3D Volume EF:                          3D EF:        56 %                          LV EDV:       131 ml                          LV ESV:       58 ml                          LV SV:        73 ml RIGHT VENTRICLE RV Basal diam:  2.40 cm RV S prime:     10.80 cm/s TAPSE (M-mode): 2.0 cm LEFT ATRIUM             Index        RIGHT ATRIUM  Index LA diam:        2.60 cm 1.85 cm/m   RA Area:     14.30 cm LA Vol (A2C):   36.4 ml 25.96 ml/m  RA Volume:   31.10 ml  22.18 ml/m LA Vol (A4C):   36.1 ml 25.75 ml/m LA Biplane Vol: 40.4 ml 28.82 ml/m  AORTIC VALVE AV Area (Vmax):    1.85 cm AV Area (Vmean):   1.46 cm AV Area (VTI):     1.92 cm AV Vmax:           112.00 cm/s AV Vmean:          90.200 cm/s  AV VTI:            0.254 m AV Peak Grad:      5.0 mmHg AV Mean Grad:      4.0 mmHg LVOT Vmax:         73.10 cm/s LVOT Vmean:        46.400 cm/s LVOT VTI:          0.172 m LVOT/AV VTI ratio: 0.68  AORTA Ao Root diam: 2.63 cm MITRAL VALVE MV Area (PHT): 4.24 cm    SHUNTS MV Decel Time: 179 msec    Systemic VTI:  0.17 m MV E velocity: 66.00 cm/s  Systemic Diam: 1.90 cm MV A velocity: 84.80 cm/s MV E/A ratio:  0.78 Christopher End MD Electronically signed by Nelva Bush MD Signature Date/Time: 01/03/2022/3:13:07 PM    Final    MR BREAST BILATERAL W WO CONTRAST INC CAD  Addendum Date: 01/02/2022   ADDENDUM REPORT: 01/02/2022 12:44 ADDENDUM: There is retraction of the left nipple with non mass enhancement extending from the biopsy proven malignancy to the base of the nipple concerning for disease involvement (series 8, image 30). Electronically Signed   By: Audie Pinto M.D.   On: 01/02/2022 12:44   Result Date: 01/02/2022 CLINICAL DATA:  76 year old female with newly diagnosed left breast invasive mammary carcinoma with positive metastatic lymph node. Patient also has a newly diagnosed left breast intraductal papilloma and right breast atypical complex fibroepithelial proliferation. EXAM: BILATERAL BREAST MRI WITH AND WITHOUT CONTRAST TECHNIQUE: Multiplanar, multisequence MR images of both breasts were obtained prior to and following the intravenous administration of 5 ml of Gadavist Three-dimensional MR images were rendered by post-processing of the original MR data on an independent workstation. The three-dimensional MR images were interpreted, and findings are reported in the following complete MRI report for this study. Three dimensional images were evaluated at the independent interpreting workstation using the DynaCAD thin client. COMPARISON:  None available. FINDINGS: Breast composition: c. Heterogeneous fibroglandular tissue. Background parenchymal enhancement: Moderate. Right breast: There is  susceptibility artifact in the central and medial retroareolar right breast at the sites of recent biopsy. In the central breast there is biopsy tract enhancement and a small amount of enhancement surrounding the biopsy cavity which is indeterminate and may represent post biopsy change. Similarly at the biopsy site in the anterior retroareolar right breast there is ill-defined small amount of enhancement measuring up to 1.2 cm which is indeterminate and could also represent post biopsy change. No additional suspicious enhancement elsewhere in the right breast. Left breast: There is a large heterogeneous enhancing mass in the outer left breast measuring approximately 3.8 x 2.8 x 3.1 cm (series 12, image 30). There is abnormal non mass enhancement extending anteriorly from the mass by 4.4 cm (series 12, image 23). There is abnormal extending posteriorly to  the pectoralis measuring 2.8 cm (series 12, image 35). There is an indeterminate small enhancing mass in the anterior slightly medial left breast measuring 4 mm (series 12, image 49). There is susceptibility artifact in the anteromedial left breast at the site of recent biopsy with no associated abnormal enhancement. Diffuse nonenhancing skin thickening in the upper outer left breast. Lymph nodes: Multiple enlarged abnormal left axillary lymph nodes (10+) including retropectoral lymph nodes. No right axillary adenopathy. Ancillary findings: Small nonenhancing T2 bright lesion in the left sternum (series 3, image 18). IMPRESSION: 1. Large heterogeneous enhancing mass consistent with known invasive malignancy in the outer left breast with abnormal non mass enhancement extending anteriorly by 4.4 cm and posteriorly to the pectoralis by 2.8 cm. 2. Indeterminate 4 mm enhancing mass in the anterior slightly medial left breast. 3. Diffuse nonenhancing skin thickening in the upper outer left breast which is nonspecific. Correlate with physical exam for inflammatory breast  cancer. 4. Post biopsy changes in the central and medial retroareolar right breast with mild surrounding enhancement. 5. Multiple enlarged left axillary lymph nodes (10+) including retropectoral lymph nodes. 6. Small nonenhancing T2 bright lesion in the left sternum, favor benign. Consider correlation with bone scan. RECOMMENDATION: If breast conservation of the left breast is being considered recommend additional biopsies of the non mass enhancement extending anterior and posterior to the known malignancy. Additionally may consider biopsy of the small indeterminate mass in the anteromedial left breast. BI-RADS CATEGORY  6: Known biopsy-proven malignancy. Electronically Signed: By: Audie Pinto M.D. On: 01/02/2022 09:25   NM PET Image Initial (PI) Skull Base To Thigh  Result Date: 12/28/2021 CLINICAL DATA:  Initial treatment strategy for left breast cancer. EXAM: NUCLEAR MEDICINE PET SKULL BASE TO THIGH TECHNIQUE: 5.9 mCi F-18 FDG was injected intravenously. Full-ring PET imaging was performed from the skull base to thigh after the radiotracer. CT data was obtained and used for attenuation correction and anatomic localization. Fasting blood glucose: 85 mg/dl COMPARISON:  CT examinations from 10/20/2020 and 02/28/2020 FINDINGS: Mediastinal blood pool activity: SUV max 1.6 Liver activity: SUV max NA NECK: No significant abnormal hypermetabolic activity in this region. Incidental CT findings: none CHEST: 3.0 cm left lateral breast mass, maximum SUV 9.3, compatible with malignancy. Clustered small left axillary lymph nodes, maximum SUV 3.2, substantially above blood pool and suspicious for malignancy. Individual nodes in this cluster measure up to about 0.7 cm in diameter. There also small but faintly hypermetabolic subpectoral lymph nodes with maximum SUV to 1.8. A 1.2 by 0.9 cm right lower lobe subpleural nodule on image 93 series 2 has a maximum SUV of 1.6. This lesion measured 1.2 by 1.1 cm on 07/22/2020  and there is an indistinct airspace opacity in this region on 02/28/2020. Incidental CT findings: Atherosclerotic calcification the aortic arch and branch vessels. ABDOMEN/PELVIS: No significant abnormal hypermetabolic activity in this region. Incidental CT findings: Atherosclerosis is present, including aortoiliac atherosclerotic disease. IVC filter noted. Sigmoid colon diverticulosis. SKELETON: No significant abnormal hypermetabolic activity in this region. Incidental CT findings: Lower cervical spondylosis IMPRESSION: 1. Hypermetabolic left breast mass compatible with malignancy. Clustered small but hypermetabolic left axillary and subpectoral lymph nodes are likely involved. 2. 1.2 by 0.9 cm right lower lobe subpleural nodule is maximum SUV of 1.6. Possibilities include chronic inflammation or low-grade adenocarcinoma. 3. Other imaging findings of potential clinical significance: Aortic Atherosclerosis (ICD10-I70.0). Sigmoid colon diverticulosis. Lower cervical spondylosis. Electronically Signed   By: Van Clines M.D.   On: 12/28/2021 15:40  Assessment and plan- Patient is a 76 y.o. 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  She is here for on treatment assessment prior to cycle 1 of neoadjuvant AC Keytruda chemotherapy  Baseline echocardiogram was normal.  Also discussed the results of MRI bilateral breast which showsLocally advanced disease in her left breast as well as local regional adenopathy but no overt abnormal findings in her right breast.  However her right breast biopsy for abnormal mammogram did show atypical complex proliferation with sclerosis.  She is therefore leaning towards bilateral mastectomy at this time without reconstruction  Patient has stage III triple negative breast cancer. Tolerated her first cycle of AC Keytruda with Udenyca on day 3 well. Labs reviewed and  explained to patient. Pt defers IVF today. Return in 3 weeks for cycle 2 of AC Keytruda chemotherapy.   Visit Diagnosis 1. Malignant neoplasm of upper-outer quadrant of left breast in female, estrogen receptor negative ([place])     Nelwyn Salisbury PA-C [place] [facility] 01/19/2022 11:40 AM

## 2022-01-29 ENCOUNTER — Other Ambulatory Visit: Payer: Self-pay | Admitting: *Deleted

## 2022-01-29 DIAGNOSIS — C50412 Malignant neoplasm of upper-outer quadrant of left female breast: Secondary | ICD-10-CM

## 2022-01-29 DIAGNOSIS — Z298 Encounter for other specified prophylactic measures: Secondary | ICD-10-CM

## 2022-01-29 MED FILL — Dexamethasone Sodium Phosphate Inj 100 MG/10ML: INTRAMUSCULAR | Qty: 1 | Status: AC

## 2022-01-30 ENCOUNTER — Inpatient Hospital Stay: Payer: PPO

## 2022-01-30 ENCOUNTER — Inpatient Hospital Stay: Payer: PPO | Admitting: Oncology

## 2022-01-30 ENCOUNTER — Encounter: Payer: Self-pay | Admitting: Oncology

## 2022-01-30 ENCOUNTER — Ambulatory Visit: Payer: PPO

## 2022-01-30 VITALS — BP 137/73 | HR 77 | Temp 97.6°F | Resp 14 | Wt 102.7 lb

## 2022-01-30 DIAGNOSIS — Z5111 Encounter for antineoplastic chemotherapy: Secondary | ICD-10-CM | POA: Diagnosis not present

## 2022-01-30 DIAGNOSIS — C50412 Malignant neoplasm of upper-outer quadrant of left female breast: Secondary | ICD-10-CM | POA: Diagnosis not present

## 2022-01-30 DIAGNOSIS — Z5112 Encounter for antineoplastic immunotherapy: Secondary | ICD-10-CM | POA: Diagnosis not present

## 2022-01-30 DIAGNOSIS — Z171 Estrogen receptor negative status [ER-]: Secondary | ICD-10-CM

## 2022-01-30 LAB — CBC WITH DIFFERENTIAL/PLATELET
Abs Immature Granulocytes: 0.14 10*3/uL — ABNORMAL HIGH (ref 0.00–0.07)
Basophils Absolute: 0.1 10*3/uL (ref 0.0–0.1)
Basophils Relative: 1 %
Eosinophils Absolute: 0 10*3/uL (ref 0.0–0.5)
Eosinophils Relative: 0 %
HCT: 39.3 % (ref 36.0–46.0)
Hemoglobin: 12.9 g/dL (ref 12.0–15.0)
Immature Granulocytes: 1 %
Lymphocytes Relative: 9 %
Lymphs Abs: 1.1 10*3/uL (ref 0.7–4.0)
MCH: 30.9 pg (ref 26.0–34.0)
MCHC: 32.8 g/dL (ref 30.0–36.0)
MCV: 94.2 fL (ref 80.0–100.0)
Monocytes Absolute: 1.1 10*3/uL — ABNORMAL HIGH (ref 0.1–1.0)
Monocytes Relative: 9 %
Neutro Abs: 9.6 10*3/uL — ABNORMAL HIGH (ref 1.7–7.7)
Neutrophils Relative %: 80 %
Platelets: 440 10*3/uL — ABNORMAL HIGH (ref 150–400)
RBC: 4.17 MIL/uL (ref 3.87–5.11)
RDW: 12.7 % (ref 11.5–15.5)
WBC: 12.1 10*3/uL — ABNORMAL HIGH (ref 4.0–10.5)
nRBC: 0 % (ref 0.0–0.2)

## 2022-01-30 LAB — COMPREHENSIVE METABOLIC PANEL
ALT: 13 U/L (ref 0–44)
AST: 21 U/L (ref 15–41)
Albumin: 3.6 g/dL (ref 3.5–5.0)
Alkaline Phosphatase: 82 U/L (ref 38–126)
Anion gap: 5 (ref 5–15)
BUN: 17 mg/dL (ref 8–23)
CO2: 27 mmol/L (ref 22–32)
Calcium: 9.3 mg/dL (ref 8.9–10.3)
Chloride: 110 mmol/L (ref 98–111)
Creatinine, Ser: 0.79 mg/dL (ref 0.44–1.00)
GFR, Estimated: >60 mL/min (ref >60)
Glucose, Bld: 96 mg/dL (ref 70–99)
Potassium: 4.2 mmol/L (ref 3.5–5.1)
Sodium: 142 mmol/L (ref 135–145)
Total Bilirubin: 0.5 mg/dL (ref 0.3–1.2)
Total Protein: 6.9 g/dL (ref 6.5–8.1)

## 2022-01-30 MED ORDER — SODIUM CHLORIDE 0.9 % IV SOLN
600.0000 mg/m2 | Freq: Once | INTRAVENOUS | Status: AC
  Administered 2022-01-30: 840 mg via INTRAVENOUS
  Filled 2022-01-30: qty 42

## 2022-01-30 MED ORDER — PALONOSETRON HCL INJECTION 0.25 MG/5ML
0.2500 mg | Freq: Once | INTRAVENOUS | Status: AC
  Administered 2022-01-30: 0.25 mg via INTRAVENOUS
  Filled 2022-01-30: qty 5

## 2022-01-30 MED ORDER — SODIUM CHLORIDE 0.9 % IV SOLN
200.0000 mg | Freq: Once | INTRAVENOUS | Status: AC
  Administered 2022-01-30: 200 mg via INTRAVENOUS
  Filled 2022-01-30: qty 8

## 2022-01-30 MED ORDER — HEPARIN SOD (PORK) LOCK FLUSH 100 UNIT/ML IV SOLN
INTRAVENOUS | Status: AC
  Filled 2022-01-30: qty 5

## 2022-01-30 MED ORDER — SODIUM CHLORIDE 0.9 % IV SOLN
10.0000 mg | Freq: Once | INTRAVENOUS | Status: AC
  Administered 2022-01-30: 10 mg via INTRAVENOUS
  Filled 2022-01-30: qty 1

## 2022-01-30 MED ORDER — SODIUM CHLORIDE 0.9 % IV SOLN
150.0000 mg | Freq: Once | INTRAVENOUS | Status: AC
  Administered 2022-01-30: 150 mg via INTRAVENOUS
  Filled 2022-01-30: qty 5

## 2022-01-30 MED ORDER — DOXORUBICIN HCL CHEMO IV INJECTION 2 MG/ML
60.0000 mg/m2 | Freq: Once | INTRAVENOUS | Status: AC
  Administered 2022-01-30: 84 mg via INTRAVENOUS
  Filled 2022-01-30: qty 42

## 2022-01-30 MED ORDER — SODIUM CHLORIDE 0.9 % IV SOLN
Freq: Once | INTRAVENOUS | Status: AC
  Filled 2022-01-30: qty 250

## 2022-01-30 MED ORDER — HEPARIN SOD (PORK) LOCK FLUSH 100 UNIT/ML IV SOLN
500.0000 [IU] | Freq: Once | INTRAVENOUS | Status: AC | PRN
  Administered 2022-01-30: 500 [IU]
  Filled 2022-01-30: qty 5

## 2022-01-30 MED FILL — Fosaprepitant Dimeglumine For IV Infusion 150 MG (Base Eq): INTRAVENOUS | Qty: 5 | Status: AC

## 2022-01-30 NOTE — Patient Instructions (Signed)
[facility]  Discharge Instructions: Thank you for choosing [facility] to provide your oncology and hematology care.  If you have a lab appointment with the Cancer Center, please go directly to the Cancer Center and check in at the registration area.  Wear comfortable clothing and clothing appropriate for easy access to any Portacath or PICC line.   We strive to give you quality time with your provider. You may need to reschedule your appointment if you arrive late (15 or more minutes).  Arriving late affects you and other patients whose appointments are after yours.  Also, if you miss three or more appointments without notifying the office, you may be dismissed from the clinic at the provider's discretion.      For prescription refill requests, have your pharmacy contact our office and allow 72 hours for refills to be completed.    Today you received the following chemotherapy and/or immunotherapy agents Keytruda, Adriamycin, Cytoxan      To help prevent nausea and vomiting after your treatment, we encourage you to take your nausea medication as directed.  BELOW ARE SYMPTOMS THAT SHOULD BE REPORTED IMMEDIATELY: *FEVER GREATER THAN 100.4 F (38 C) OR HIGHER *CHILLS OR SWEATING *NAUSEA AND VOMITING THAT IS NOT CONTROLLED WITH YOUR NAUSEA MEDICATION *UNUSUAL SHORTNESS OF BREATH *UNUSUAL BRUISING OR BLEEDING *URINARY PROBLEMS (pain or burning when urinating, or frequent urination) *BOWEL PROBLEMS (unusual diarrhea, constipation, pain near the anus) TENDERNESS IN MOUTH AND THROAT WITH OR WITHOUT PRESENCE OF ULCERS (sore throat, sores in mouth, or a toothache) UNUSUAL RASH, SWELLING OR PAIN  UNUSUAL VAGINAL DISCHARGE OR ITCHING   Items with * indicate a potential emergency and should be followed up as soon as possible or go to the Emergency Department if any problems should occur.  Please show the CHEMOTHERAPY ALERT CARD or IMMUNOTHERAPY ALERT  CARD at check-in to the Emergency Department and triage nurse.  Should you have questions after your visit or need to cancel or reschedule your appointment, please contact [facility]  [phone] and follow the prompts.  Office hours are 8:00 a.m. to 4:30 p.m. Monday - Friday. Please note that voicemails left after 4:00 p.m. may not be returned until the following business day.  We are closed weekends and major holidays. You have access to a nurse at all times for urgent questions. Please call the main number to the clinic [phone] and follow the prompts.  For any non-urgent questions, you may also contact your provider using MyChart. We now offer e-Visits for anyone 18 and older to request care online for non-urgent symptoms. For details visit [email or web address].   Also download the MyChart app! Go to the app store, search "MyChart", open the app, select [facility], and log in with your MyChart username and password.  Masks are optional in the cancer centers. If you would like for your care team to wear a mask while they are taking care of you, please let them know. For doctor visits, patients may have with them one support person who is at least 78 years old. At this time, visitors are not allowed in the infusion area.   

## 2022-01-30 NOTE — Progress Notes (Signed)
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  Showed a irregular mass measuring 2.3 x 3.2 x 3.6 cm.  There were 2 other smaller masses in the left breast measuring 4 x 7 x 9 mm and 3 x 5 x 5 mm.  7 abnormal lymph nodes in the left axilla.  Indeterminate solid and cystic mass in the right breast in the retroareolar region measuring 1 x 1.2 x 1.9 cm as well as another 4.5 cm group of indeterminate microcalcifications in the lower right breast.  Patient had 2 left breast biopsy along with left lymph node biopsy.  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   PET CT scan showed hypermetabolic left breast mass and clusters of small but hypermetabolic left axillary and subpectoral lymph nodes.  1.2 x 0.9 cm subpleural nodule with an SUV of 1.6.  Patient is being treated as per keynote 522 regimen starting on 01/09/2022  Interval history-tolerated first cycle of chemotherapy well.   She did have some fatigue and mild self-limited nausea which was well controlled with medications.  She is here with her niece today.  Appetite is fair and her niece is trying to get her to eat better.  ECOG PS- 1 Pain scale- 0  Review of systems- Review of Systems  Constitutional:  Positive for malaise/fatigue. Negative for chills, fever and weight loss.  HENT:  Negative for congestion, ear discharge and nosebleeds.   Eyes:  Negative for blurred vision.  Respiratory:  Negative for cough, hemoptysis, sputum production, shortness of breath and wheezing.   Cardiovascular:  Negative for chest pain, palpitations, orthopnea and claudication.  Gastrointestinal:  Negative for abdominal pain, blood in stool, constipation, diarrhea, heartburn, melena, nausea and vomiting.  Genitourinary:  Negative for dysuria, flank pain, frequency, hematuria and urgency.  Musculoskeletal:  Negative for back pain, joint pain and myalgias.  Skin:  Negative for rash.  Neurological:  Negative for dizziness, tingling, focal weakness, seizures, weakness and headaches.  Endo/Heme/Allergies:  Does not bruise/bleed easily.  Psychiatric/Behavioral:  Negative for depression and suicidal ideas. The patient does not have insomnia.       No Known Allergies   Past Medical History:  Diagnosis Date   Anemia    Anxiety    Arthritis    NECK   Cancer ([place])    BASAL CELL AND MELANOMA   DAVF (dural arteriovenous fistula) 2015   Dementia ([facility])    Family history of adverse reaction to anesthesia    niece has to come out of anesthesia slow   Hypertension  Pneumonia      Past Surgical History:  Procedure Laterality Date   BRAIN SURGERY  2015   DURAL AV FISTULA ([facility])   BREAST BIOPSY Left 12/14/2021   [place] Bx 3:00 7 CMFN, Coil Clip, Path Pending   BREAST BIOPSY Left 12/14/2021   [place] Bx 10:00 2 CMFN, Venus Clip, path pending   breast biopsy Left 12/14/2021   [place] Axille, Hydromarker (butterfly). path pending    BREAST BIOPSY Right 12/19/2021   Stereo bx-calcs, "COIL" clip-path pending   BREAST BIOPSY Right 12/19/2021   u/s bx-mass, 3:00, retroareolar, "HEART" clip-path pending   COLONOSCOPY     COLONOSCOPY WITH PROPOFOL N/A 02/20/2017   Procedure: COLONOSCOPY WITH PROPOFOL;  Surgeon: Manya Silvas, MD;  Location: [facility] ENDOSCOPY;  Service: Endoscopy;  Laterality: N/A;   HYSTEROSCOPY WITH D & C N/A 07/29/2015   Procedure: DILATATION AND CURETTAGE /HYSTEROSCOPY;  Surgeon: Benjaman Kindler, MD;  Location: [facility] ORS;  Service: Gynecology;  Laterality: N/A;   HYSTEROSCOPY WITH D & C N/A 01/24/2018   Procedure: DILATATION AND CURETTAGE /HYSTEROSCOPY;  Surgeon: Benjaman Kindler, MD;  Location: [facility] ORS;  Service: Gynecology;  Laterality: N/A;   LAPAROSCOPY N/A 01/24/2018   Procedure: LAPAROSCOPY DIAGNOSTIC;  Surgeon: Benjaman Kindler, MD;  Location: [facility] ORS;  Service: Gynecology;  Laterality: N/A;   PERIPHERAL VASCULAR THROMBECTOMY N/A 02/29/2020   Procedure: PERIPHERAL VASCULAR THROMBECTOMY / THROMBOLYSIS WITH POSSIBLE PULMONARY THROMBECTOMY / THROMBOLYSIS;  Surgeon: Algernon Huxley, MD;  Location: [facility];  Service: Cardiovascular;  Laterality: N/A;   PORTACATH PLACEMENT N/A 01/08/2022   Procedure: INSERTION PORT-A-CATH;  Surgeon: Herbert Pun, MD;  Location: [facility] ORS;  Service: General;  Laterality: N/A;   TONSILLECTOMY     AGE 5   TOTAL LAPAROSCOPIC HYSTERECTOMY WITH BILATERAL SALPINGO OOPHORECTOMY Bilateral 12/28/2019   Procedure: TOTAL LAPAROSCOPIC HYSTERECTOMY WITH BILATERAL SALPINGO OOPHORECTOMY;  Surgeon: Benjaman Kindler, MD;  Location: [facility] ORS;  Service: Gynecology;  Laterality: Bilateral;   TUBAL LIGATION      Social History   Socioeconomic History   Marital status: Divorced    Spouse name: Not on file   Number of children: Not on file   Years of education: Not on file   Highest education level: Not on file  Occupational History   Not on file  Tobacco Use    Smoking status: Former    Packs/day: 0.50    Years: 15.00    Total pack years: 7.50    Types: Cigarettes    Quit date: 07/21/2000    Years since quitting: 21.5   Smokeless tobacco: Never  Vaping Use   Vaping Use: Never used  Substance and Sexual Activity   Alcohol use: Not Currently   Drug use: No   Sexual activity: Not Currently  Other Topics Concern   Not on file  Social History Narrative   Lives alone, caregiver 92 year old daughter   Social Determinants of Health   Financial Resource Strain: Not on file  Food Insecurity: Not on file  Transportation Needs: Not on file  Physical Activity: Not on file  Stress: Not on file  Social Connections: Not on file  Intimate Partner Violence: Not on file    Family History  Problem Relation Age of Onset   Stomach cancer Sister    Hypertension Sister    Hypertension Sister    Diabetes Niece    Hypertension Niece    Breast cancer Neg Hx      Current Outpatient Medications:  buPROPion (WELLBUTRIN XL) 150 MG 24 hr tablet, Take 150 mg by mouth every morning., Disp: , Rfl: 6   cetirizine (ZYRTEC) 10 MG tablet, Take 10 mg by mouth daily as needed for allergies., Disp: , Rfl:    polyethylene glycol (MIRALAX / GLYCOLAX) 17 g packet, Take 17 g by mouth daily., Disp: , Rfl:    Calcium Carbonate-Vitamin D (OS-CAL 500 + D PO), Take 1 tablet by mouth daily.  (Patient not taking: Reported on 12/22/2021), Disp: , Rfl:    Cholecalciferol (DIALYVITE VITAMIN D 5000) 125 MCG (5000 UT) capsule, Take 5,000 Units by mouth daily. (Patient not taking: Reported on 12/22/2021), Disp: , Rfl:    docusate sodium (COLACE) 100 MG capsule, Take 1 capsule (100 mg total) by mouth 2 (two) times daily. To keep stools soft (Patient not taking: Reported on 02/29/2020), Disp: 30 capsule, Rfl: 0   estrogen, conjugated,-medroxyprogesterone (PREMPRO) 0.625-5 MG tablet, Take 1 tablet by mouth every evening.  (Patient not taking: Reported on 12/22/2021), Disp: , Rfl:     Lutein 40 MG CAPS, Take 40 mg by mouth daily. (Patient not taking: Reported on 12/22/2021), Disp: , Rfl:    oxyCODONE (OXY IR/ROXICODONE) 5 MG immediate release tablet, Take 1 tablet (5 mg total) by mouth every 4 (four) hours as needed for severe pain. (Patient not taking: Reported on 02/29/2020), Disp: 20 tablet, Rfl: 0   oxyCODONE (ROXICODONE) 5 MG immediate release tablet, Take 1 tablet (5 mg total) by mouth every 4 (four) hours as needed for severe pain. (Patient not taking: Reported on 01/19/2022), Disp: 5 tablet, Rfl: 0   rivaroxaban (XARELTO) 20 MG TABS tablet, 1 tablet (20 mg total) daily with breakfast (Patient not taking: Reported on 01/19/2022), Disp: , Rfl:    vitamin B-12 (CYANOCOBALAMIN) 1000 MCG tablet, Take 1,000 mcg by mouth daily.  (Patient not taking: Reported on 12/22/2021), Disp: , Rfl:    XARELTO 20 MG TABS tablet, TAKE 1 TABLET BY MOUTH EVERY DAY (Patient not taking: Reported on 10/11/2020), Disp: 90 tablet, Rfl: 1 No current facility-administered medications for this visit.  Facility-Administered Medications Ordered in Other Visits:    heparin lock flush 100 UNIT/ML injection, , , ,   Physical exam:  Vitals:   01/30/22 0838  BP: 137/73  Pulse: 77  Resp: 14  Temp: 97.6 F (36.4 C)  SpO2: 100%  Weight: 102 lb 11.2 oz (46.6 kg)   Physical Exam Constitutional:      General: She is not in acute distress. Cardiovascular:     Rate and Rhythm: Normal rate and regular rhythm.     Heart sounds: Normal heart sounds.  Pulmonary:     Effort: Pulmonary effort is normal.     Breath sounds: Normal breath sounds.  Abdominal:     General: Bowel sounds are normal.     Palpations: Abdomen is soft.  Skin:    General: Skin is warm and dry.  Neurological:     Mental Status: She is alert and oriented to person, place, and time.   Breast exam: Palpable left breast mass and left axillary adenopathy overall stable as compared to prior exam.     Latest Ref Rng & Units 01/30/2022     8:23 AM  CMP  Glucose 70 - 99 mg/dL 96   BUN 8 - 23 mg/dL 17   Creatinine 0.44 - 1.00 mg/dL 0.79   Sodium 135 - 145 mmol/L 142   Potassium 3.5 - 5.1 mmol/L 4.2   Chloride 98 -  111 mmol/L 110   CO2 22 - 32 mmol/L 27   Calcium 8.9 - 10.3 mg/dL 9.3   Total Protein 6.5 - 8.1 g/dL 6.9   Total Bilirubin 0.3 - 1.2 mg/dL 0.5   Alkaline Phos 38 - 126 U/L 82   AST 15 - 41 U/L 21   ALT 0 - 44 U/L 13       Latest Ref Rng & Units 01/30/2022    8:23 AM  CBC  WBC 4.0 - 10.5 K/uL 12.1   Hemoglobin 12.0 - 15.0 g/dL 12.9   Hematocrit 36.0 - 46.0 % 39.3   Platelets 150 - 400 K/uL 440     No images are attached to the encounter.  DG Chest Port 1 View  Result Date: 01/08/2022 CLINICAL DATA:  Evaluate porta catheter placement. EXAM: PORTABLE CHEST 1 VIEW COMPARISON:  10/20/2020 FINDINGS: There is a right chest wall port a catheter with tip in the distal SVC. No pneumothorax visualized. Aortic atherosclerotic calcifications. Heart size and mediastinal contours are unremarkable. No pleural effusion or edema. No airspace opacities. IMPRESSION: Satisfactory position of right chest wall port a catheter. No pneumothorax. Electronically Signed   By: Kerby Moors M.D.   On: 01/08/2022 08:46   DG C-Arm 1-60 Min-No Report  Result Date: 01/08/2022 Fluoroscopy was utilized by the requesting physician.  No radiographic interpretation.   MR Brain W Wo Contrast  Result Date: 01/04/2022 CLINICAL DATA:  76 year old female with breast cancer. Evidence of left axillary lymph node involvement on PET-CT. History of right occipital dural AV fistula in 2014. Dizziness. Staging. EXAM: MRI HEAD WITHOUT AND WITH CONTRAST TECHNIQUE: Multiplanar, multiecho pulse sequences of the brain and surrounding structures were obtained without and with intravenous contrast. CONTRAST:  41mL GADAVIST GADOBUTROL 1 MMOL/ML IV SOLN COMPARISON:  PET-CT 12/27/2021.  Head CT 08/17/2020. CTA head and neck 05/13/2013. FINDINGS: Brain: No restricted  diffusion to suggest acute infarction. No midline shift, mass effect, evidence of mass lesion, ventriculomegaly, extra-axial collection or acute intracranial hemorrhage. Cervicomedullary junction and pituitary are within normal limits. No abnormal intra-axial enhancement is identified to indicate parenchymal brain metastases. However, there are several small nodular areas of dural thickening and enhancement (such as right anterior superior frontal convexity series 18 image 131 and coronal will series 19, image 17. And along the posterior right mastoid bone there is a roughly 17 mm segment of abnormal soft tissue thickening and enhancement (series 18, image 46) corresponding to abnormal bone mineralization on the recent PET-CT and head CT. However, no hypermetabolism there by PET. And furthermore, this is the area of the 2014 dural fistula. Patchy and scattered moderate for age bilateral cerebral white matter T2 and FLAIR hyperintensity including some deep white matter capsule involvement. Several chronic microhemorrhages scattered in the brain including the left thalamus (series 13 image 31). Moderate associated T2 heterogeneity throughout the bilateral deep gray nuclei, including chronic thalamic lacunar infarcts left greater than right. Brainstem and cerebellum are relatively spared. Vascular: Major intracranial vascular flow voids are preserved, the distal right vertebral artery appears dominant. Major dural venous sinuses are enhancing and appear to be patent. Skull and upper cervical spine: Abnormal right lateral occipital bone posterior to the mastoid as detailed above. But in general, background bone mineralization is normal. No other suspicious bone lesion identified. Cervical spine C3-C4 degeneration. Sinuses/Orbits: Negative orbits. Paranasal Visualized paranasal sinuses and mastoids are stable and well aerated. Other: Visible internal auditory structures appear normal. Negative visible scalp and face.  IMPRESSION: 1. No  brain parenchymal metastasis. But there are several tiny areas of nodular dural thickening at the convexities (series 18, image 110). And there is a 1.8 cm area of abnormal bone posterior to the right mastoid bone. However, benign etiology of these is favored (the right mastoid region corresponds to a 2014 dural fistula site and seemed negative on a recent PET-CT. 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 Recommend repeat Brain MRI without and with contrast in 3 months to document stability. 2. No other acute intracranial abnormality. 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 Electronically Signed   By: Genevie Ann M.D.   On: 01/04/2022 06:59   ECHOCARDIOGRAM COMPLETE  Result Date: 01/03/2022    ECHOCARDIOGRAM REPORT   Patient Name:   KITARA HEBB Opdahl Date of Exam: 01/03/2022 Medical Rec #:  [id number]          Height:       59.0 in Accession #:    [id number]         Weight:       104.9 lb Date of Birth:  1945/11/14          BSA:          1.402 m Patient Age:    35 years           BP:           119/76 mmHg Patient Gender: F                  HR:           89 bpm. Exam Location:  [facility] Procedure: 2D Echo, Cardiac Doppler, Color Doppler and Strain Analysis Indications:     Chemo Z09  History:         Patient has prior history of Echocardiogram examinations, most                  recent 03/01/2020. Risk Factors:Hypertension. Cancer.  Sonographer:     Sherrie Sport Referring Phys:  [id number] [facility] Margurite Duffy Diagnosing Phys: Harrell Gave End MD  Sonographer Comments: Global longitudinal strain was attempted. IMPRESSIONS  1. Left ventricular ejection fraction, by estimation, is 55 to 60%. The left ventricle has normal function. Left ventricular endocardial border not optimally defined to evaluate regional wall motion. Left ventricular diastolic parameters are consistent with Grade I diastolic dysfunction (impaired relaxation). The average left ventricular global longitudinal strain is -18.0 %. The  global longitudinal strain is normal.  2. Right ventricular systolic function is normal. The right ventricular size is normal. Tricuspid regurgitation signal is inadequate for assessing PA pressure.  3. The mitral valve is degenerative. Mild to moderate mitral valve regurgitation. No evidence of mitral stenosis.  4. The aortic valve was not well visualized. Aortic valve regurgitation is not visualized. No aortic stenosis is present.  5. The inferior vena cava is normal in size with greater than 50% respiratory variability, suggesting right atrial pressure of 3 mmHg. FINDINGS  Left Ventricle: Left ventricular ejection fraction, by estimation, is 55 to 60%. The left ventricle has normal function. Left ventricular endocardial border not optimally defined to evaluate regional wall motion. The average left ventricular global longitudinal strain is -18.0 %. The global longitudinal strain is normal. The left ventricular internal cavity size was normal in size. There is no left ventricular hypertrophy. Left ventricular diastolic parameters are consistent with Grade I diastolic dysfunction (impaired relaxation). Right Ventricle: The right ventricular size is normal. No increase in right ventricular wall thickness. Right ventricular  systolic function is normal. Tricuspid regurgitation signal is inadequate for assessing PA pressure. Left Atrium: Left atrial size was normal in size. Right Atrium: Right atrial size was normal in size. Pericardium: There is no evidence of pericardial effusion. Mitral Valve: The mitral valve is degenerative in appearance. There is mild thickening of the mitral valve leaflet(s). Mild to moderate mitral valve regurgitation. No evidence of mitral valve stenosis. Tricuspid Valve: The tricuspid valve is not well visualized. Tricuspid valve regurgitation is trivial. Aortic Valve: The aortic valve was not well visualized. Aortic valve regurgitation is not visualized. No aortic stenosis is present. Aortic  valve mean gradient measures 4.0 mmHg. Aortic valve peak gradient measures 5.0 mmHg. Aortic valve area, by VTI measures 1.92 cm. Pulmonic Valve: The pulmonic valve was not well visualized. Pulmonic valve regurgitation is not visualized. No evidence of pulmonic stenosis. Aorta: The aortic root is normal in size and structure. Pulmonary Artery: The pulmonary artery is not well seen. Venous: The inferior vena cava is normal in size with greater than 50% respiratory variability, suggesting right atrial pressure of 3 mmHg. IAS/Shunts: The interatrial septum was not well visualized.  LEFT VENTRICLE PLAX 2D LVIDd:         3.80 cm   Diastology LVIDs:         2.80 cm   LV e' medial:    4.90 cm/s LV PW:         0.88 cm   LV E/e' medial:  13.5 LV IVS:        0.90 cm   LV e' lateral:   5.66 cm/s LVOT diam:     1.90 cm   LV E/e' lateral: 11.7 LV SV:         49 LV SV Index:   35        2D Longitudinal Strain LVOT Area:     2.84 cm  2D Strain GLS Avg:     -18.0 %                           3D Volume EF:                          3D EF:        56 %                          LV EDV:       131 ml                          LV ESV:       58 ml                          LV SV:        73 ml RIGHT VENTRICLE RV Basal diam:  2.40 cm RV S prime:     10.80 cm/s TAPSE (M-mode): 2.0 cm LEFT ATRIUM             Index        RIGHT ATRIUM           Index LA diam:        2.60 cm 1.85 cm/m   RA Area:     14.30 cm LA Vol (A2C):   36.4 ml 25.96 ml/m  RA Volume:   31.10 ml  22.18 ml/m LA Vol (A4C):   36.1 ml 25.75 ml/m LA Biplane Vol: 40.4 ml 28.82 ml/m  AORTIC VALVE AV Area (Vmax):    1.85 cm AV Area (Vmean):   1.46 cm AV Area (VTI):     1.92 cm AV Vmax:           112.00 cm/s AV Vmean:          90.200 cm/s AV VTI:            0.254 m AV Peak Grad:      5.0 mmHg AV Mean Grad:      4.0 mmHg LVOT Vmax:         73.10 cm/s LVOT Vmean:        46.400 cm/s LVOT VTI:          0.172 m LVOT/AV VTI ratio: 0.68  AORTA Ao Root diam: 2.63 cm MITRAL VALVE MV Area  (PHT): 4.24 cm    SHUNTS MV Decel Time: 179 msec    Systemic VTI:  0.17 m MV E velocity: 66.00 cm/s  Systemic Diam: 1.90 cm MV A velocity: 84.80 cm/s MV E/A ratio:  0.78 Christopher End MD Electronically signed by Nelva Bush MD Signature Date/Time: 01/03/2022/3:13:07 PM    Final    MR BREAST BILATERAL W WO CONTRAST INC CAD  Addendum Date: 01/02/2022   ADDENDUM REPORT: 01/02/2022 12:44 ADDENDUM: There is retraction of the left nipple with non mass enhancement extending from the biopsy proven malignancy to the base of the nipple concerning for disease involvement (series 8, image 30). Electronically Signed   By: Audie Pinto M.D.   On: 01/02/2022 12:44   Result Date: 01/02/2022 CLINICAL DATA:  76 year old female with newly diagnosed left breast invasive mammary carcinoma with positive metastatic lymph node. Patient also has a newly diagnosed left breast intraductal papilloma and right breast atypical complex fibroepithelial proliferation. EXAM: BILATERAL BREAST MRI WITH AND WITHOUT CONTRAST TECHNIQUE: Multiplanar, multisequence MR images of both breasts were obtained prior to and following the intravenous administration of 5 ml of Gadavist Three-dimensional MR images were rendered by post-processing of the original MR data on an independent workstation. The three-dimensional MR images were interpreted, and findings are reported in the following complete MRI report for this study. Three dimensional images were evaluated at the independent interpreting workstation using the DynaCAD thin client. COMPARISON:  None available. FINDINGS: Breast composition: c. Heterogeneous fibroglandular tissue. Background parenchymal enhancement: Moderate. Right breast: There is susceptibility artifact in the central and medial retroareolar right breast at the sites of recent biopsy. In the central breast there is biopsy tract enhancement and a small amount of enhancement surrounding the biopsy cavity which is indeterminate  and may represent post biopsy change. Similarly at the biopsy site in the anterior retroareolar right breast there is ill-defined small amount of enhancement measuring up to 1.2 cm which is indeterminate and could also represent post biopsy change. No additional suspicious enhancement elsewhere in the right breast. Left breast: There is a large heterogeneous enhancing mass in the outer left breast measuring approximately 3.8 x 2.8 x 3.1 cm (series 12, image 30). There is abnormal non mass enhancement extending anteriorly from the mass by 4.4 cm (series 12, image 23). There is abnormal extending posteriorly to the pectoralis measuring 2.8 cm (series 12, image 35). There is an indeterminate small enhancing mass in the anterior slightly medial left breast measuring 4 mm (series 12, image 49). There is susceptibility artifact in the anteromedial left breast at the  site of recent biopsy with no associated abnormal enhancement. Diffuse nonenhancing skin thickening in the upper outer left breast. Lymph nodes: Multiple enlarged abnormal left axillary lymph nodes (10+) including retropectoral lymph nodes. No right axillary adenopathy. Ancillary findings: Small nonenhancing T2 bright lesion in the left sternum (series 3, image 18). IMPRESSION: 1. Large heterogeneous enhancing mass consistent with known invasive malignancy in the outer left breast with abnormal non mass enhancement extending anteriorly by 4.4 cm and posteriorly to the pectoralis by 2.8 cm. 2. Indeterminate 4 mm enhancing mass in the anterior slightly medial left breast. 3. Diffuse nonenhancing skin thickening in the upper outer left breast which is nonspecific. Correlate with physical exam for inflammatory breast cancer. 4. Post biopsy changes in the central and medial retroareolar right breast with mild surrounding enhancement. 5. Multiple enlarged left axillary lymph nodes (10+) including retropectoral lymph nodes. 6. Small nonenhancing T2 bright lesion in  the left sternum, favor benign. Consider correlation with bone scan. RECOMMENDATION: If breast conservation of the left breast is being considered recommend additional biopsies of the non mass enhancement extending anterior and posterior to the known malignancy. Additionally may consider biopsy of the small indeterminate mass in the anteromedial left breast. BI-RADS CATEGORY  6: Known biopsy-proven malignancy. Electronically Signed: By: Audie Pinto M.D. On: 01/02/2022 09:25     Assessment and plan- Patient is a 76 y.o. 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  She is here for on treatment assessment prior to cycle 2 of neoadjuvant AC Keytruda chemotherapy  Counts okay to proceed with cycle 2 of neoadjuvant AC Keytruda chemotherapy today with Udenyca on day 3.  She has tolerated first cycle well so far.  I will see her back in 3 weeks for cycle 3 of chemotherapy.   Chemo-induced nausea: Continue as needed nausea medications as prescribed   Visit Diagnosis 1. Malignant neoplasm of upper-outer quadrant of left breast in female, estrogen receptor negative ([place])   2. Encounter for antineoplastic chemotherapy      Dr. Randa Evens, MD, MPH [facility] at [facility] [place] [phone] 01/30/2022 12:34 PM

## 2022-01-30 NOTE — Progress Notes (Signed)
ON PATHWAY REGIMEN - Breast  No Change  Continue With Treatment as Ordered.  Original Decision Date/Time: 12/29/2021 14:48     Cycles 1 through 4: A cycle is every 21 days:     Pembrolizumab      Paclitaxel      Carboplatin      Filgrastim-xxxx    Cycles 5 through 8: A cycle is every 21 days:     Pembrolizumab      Doxorubicin      Cyclophosphamide      Pegfilgrastim-xxxx   **Always confirm dose/schedule in your pharmacy ordering system**  Patient Characteristics: Preoperative or Nonsurgical Candidate (Clinical Staging), Neoadjuvant Therapy followed by Surgery, Invasive Disease, Chemotherapy, HER2 Negative/Unknown/Equivocal, ER Negative/Unknown, Platinum Therapy Indicated and Candidate for Checkpoint Inhibitor Therapeutic Status: Preoperative or Nonsurgical Candidate (Clinical Staging) AJCC M Category: cM0 AJCC Grade: G3 Breast Surgical Plan: Neoadjuvant Therapy followed by Surgery ER Status: Negative (-) AJCC 8 Stage Grouping: IIIA HER2 Status: Negative (-) AJCC T Category: cT2 AJCC N Category: cN1 PR Status: Positive (+) Intent of Therapy: Curative Intent, Discussed with Patient

## 2022-02-01 ENCOUNTER — Inpatient Hospital Stay: Payer: PPO

## 2022-02-01 DIAGNOSIS — Z171 Estrogen receptor negative status [ER-]: Secondary | ICD-10-CM

## 2022-02-01 DIAGNOSIS — Z5112 Encounter for antineoplastic immunotherapy: Secondary | ICD-10-CM | POA: Diagnosis not present

## 2022-02-01 DIAGNOSIS — C50412 Malignant neoplasm of upper-outer quadrant of left female breast: Secondary | ICD-10-CM

## 2022-02-01 MED ORDER — PEGFILGRASTIM-CBQV 6 MG/0.6ML ~~LOC~~ SOSY
6.0000 mg | PREFILLED_SYRINGE | Freq: Once | SUBCUTANEOUS | Status: AC
  Administered 2022-02-01: 6 mg via SUBCUTANEOUS
  Filled 2022-02-01: qty 0.6

## 2022-02-13 ENCOUNTER — Other Ambulatory Visit: Payer: Self-pay | Admitting: Oncology

## 2022-02-13 DIAGNOSIS — Z171 Estrogen receptor negative status [ER-]: Secondary | ICD-10-CM

## 2022-02-15 ENCOUNTER — Other Ambulatory Visit: Payer: Self-pay

## 2022-02-19 MED FILL — Fosaprepitant Dimeglumine For IV Infusion 150 MG (Base Eq): INTRAVENOUS | Qty: 5 | Status: AC

## 2022-02-19 MED FILL — Dexamethasone Sodium Phosphate Inj 100 MG/10ML: INTRAMUSCULAR | Qty: 1 | Status: AC

## 2022-02-20 ENCOUNTER — Inpatient Hospital Stay (HOSPITAL_BASED_OUTPATIENT_CLINIC_OR_DEPARTMENT_OTHER): Payer: PPO | Admitting: Oncology

## 2022-02-20 ENCOUNTER — Inpatient Hospital Stay: Payer: PPO

## 2022-02-20 ENCOUNTER — Inpatient Hospital Stay: Payer: PPO | Attending: Oncology

## 2022-02-20 ENCOUNTER — Encounter: Payer: Self-pay | Admitting: Oncology

## 2022-02-20 VITALS — BP 133/81 | HR 77 | Temp 96.7°F | Resp 18 | Wt 101.1 lb

## 2022-02-20 DIAGNOSIS — Z171 Estrogen receptor negative status [ER-]: Secondary | ICD-10-CM | POA: Insufficient documentation

## 2022-02-20 DIAGNOSIS — C50412 Malignant neoplasm of upper-outer quadrant of left female breast: Secondary | ICD-10-CM

## 2022-02-20 DIAGNOSIS — Z5111 Encounter for antineoplastic chemotherapy: Secondary | ICD-10-CM

## 2022-02-20 DIAGNOSIS — Z5189 Encounter for other specified aftercare: Secondary | ICD-10-CM | POA: Diagnosis not present

## 2022-02-20 DIAGNOSIS — Z79899 Other long term (current) drug therapy: Secondary | ICD-10-CM | POA: Diagnosis not present

## 2022-02-20 DIAGNOSIS — Z5112 Encounter for antineoplastic immunotherapy: Secondary | ICD-10-CM | POA: Diagnosis not present

## 2022-02-20 LAB — COMPREHENSIVE METABOLIC PANEL
ALT: 16 U/L (ref 0–44)
AST: 21 U/L (ref 15–41)
Albumin: 3.6 g/dL (ref 3.5–5.0)
Alkaline Phosphatase: 75 U/L (ref 38–126)
Anion gap: 3 — ABNORMAL LOW (ref 5–15)
BUN: 11 mg/dL (ref 8–23)
CO2: 26 mmol/L (ref 22–32)
Calcium: 9 mg/dL (ref 8.9–10.3)
Chloride: 112 mmol/L — ABNORMAL HIGH (ref 98–111)
Creatinine, Ser: 0.72 mg/dL (ref 0.44–1.00)
GFR, Estimated: >60 mL/min (ref >60)
Glucose, Bld: 92 mg/dL (ref 70–99)
Potassium: 3.8 mmol/L (ref 3.5–5.1)
Sodium: 141 mmol/L (ref 135–145)
Total Bilirubin: 0.5 mg/dL (ref 0.3–1.2)
Total Protein: 6.5 g/dL (ref 6.5–8.1)

## 2022-02-20 LAB — CBC WITH DIFFERENTIAL/PLATELET
Abs Immature Granulocytes: 0.09 10*3/uL — ABNORMAL HIGH (ref 0.00–0.07)
Basophils Absolute: 0.1 10*3/uL (ref 0.0–0.1)
Basophils Relative: 1 %
Eosinophils Absolute: 0 10*3/uL (ref 0.0–0.5)
Eosinophils Relative: 0 %
HCT: 36.9 % (ref 36.0–46.0)
Hemoglobin: 12.2 g/dL (ref 12.0–15.0)
Immature Granulocytes: 1 %
Lymphocytes Relative: 8 %
Lymphs Abs: 0.8 10*3/uL (ref 0.7–4.0)
MCH: 31.2 pg (ref 26.0–34.0)
MCHC: 33.1 g/dL (ref 30.0–36.0)
MCV: 94.4 fL (ref 80.0–100.0)
Monocytes Absolute: 0.9 10*3/uL (ref 0.1–1.0)
Monocytes Relative: 9 %
Neutro Abs: 7.7 10*3/uL (ref 1.7–7.7)
Neutrophils Relative %: 81 %
Platelets: 420 10*3/uL — ABNORMAL HIGH (ref 150–400)
RBC: 3.91 MIL/uL (ref 3.87–5.11)
RDW: 14.8 % (ref 11.5–15.5)
WBC: 9.6 10*3/uL (ref 4.0–10.5)
nRBC: 0 % (ref 0.0–0.2)

## 2022-02-20 MED ORDER — HEPARIN SOD (PORK) LOCK FLUSH 100 UNIT/ML IV SOLN
500.0000 [IU] | Freq: Once | INTRAVENOUS | Status: AC
  Administered 2022-02-20: 500 [IU] via INTRAVENOUS
  Filled 2022-02-20: qty 5

## 2022-02-20 MED ORDER — SODIUM CHLORIDE 0.9 % IV SOLN
Freq: Once | INTRAVENOUS | Status: AC
  Filled 2022-02-20: qty 250

## 2022-02-20 MED ORDER — SODIUM CHLORIDE 0.9 % IV SOLN
10.0000 mg | Freq: Once | INTRAVENOUS | Status: AC
  Administered 2022-02-20: 10 mg via INTRAVENOUS
  Filled 2022-02-20: qty 10

## 2022-02-20 MED ORDER — SODIUM CHLORIDE 0.9 % IV SOLN
600.0000 mg/m2 | Freq: Once | INTRAVENOUS | Status: AC
  Administered 2022-02-20: 840 mg via INTRAVENOUS
  Filled 2022-02-20: qty 42

## 2022-02-20 MED ORDER — SODIUM CHLORIDE 0.9 % IV SOLN
200.0000 mg | Freq: Once | INTRAVENOUS | Status: AC
  Administered 2022-02-20: 200 mg via INTRAVENOUS
  Filled 2022-02-20: qty 8

## 2022-02-20 MED ORDER — HEPARIN SOD (PORK) LOCK FLUSH 100 UNIT/ML IV SOLN
500.0000 [IU] | Freq: Once | INTRAVENOUS | Status: DC | PRN
  Filled 2022-02-20: qty 5

## 2022-02-20 MED ORDER — PALONOSETRON HCL INJECTION 0.25 MG/5ML
0.2500 mg | Freq: Once | INTRAVENOUS | Status: AC
  Administered 2022-02-20: 0.25 mg via INTRAVENOUS
  Filled 2022-02-20: qty 5

## 2022-02-20 MED ORDER — SODIUM CHLORIDE 0.9% FLUSH
10.0000 mL | Freq: Once | INTRAVENOUS | Status: AC
  Administered 2022-02-20: 10 mL via INTRAVENOUS
  Filled 2022-02-20: qty 10

## 2022-02-20 MED ORDER — SODIUM CHLORIDE 0.9 % IV SOLN
150.0000 mg | Freq: Once | INTRAVENOUS | Status: AC
  Administered 2022-02-20: 150 mg via INTRAVENOUS
  Filled 2022-02-20: qty 150

## 2022-02-20 MED ORDER — DOXORUBICIN HCL CHEMO IV INJECTION 2 MG/ML
60.0000 mg/m2 | Freq: Once | INTRAVENOUS | Status: AC
  Administered 2022-02-20: 84 mg via INTRAVENOUS
  Filled 2022-02-20: qty 42

## 2022-02-20 NOTE — Progress Notes (Signed)
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  Showed a irregular mass measuring 2.3 x 3.2 x 3.6 cm.  There were 2 other smaller masses in the left breast measuring 4 x 7 x 9 mm and 3 x 5 x 5 mm.  7 abnormal lymph nodes in the left axilla.  Indeterminate solid and cystic mass in the right breast in the retroareolar region measuring 1 x 1.2 x 1.9 cm as well as another 4.5 cm group of indeterminate microcalcifications in the lower right breast.  Patient had 2 left breast biopsy along with left lymph node biopsy.  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   PET CT scan showed hypermetabolic left breast mass and clusters of small but hypermetabolic left axillary and subpectoral lymph nodes.  1.2 x 0.9 cm subpleural nodule with an SUV of 1.6.   Patient is being treated as per keynote 522 regimen starting on 01/09/2022  Interval history-tolerating chemotherapy well so far.   Other than mild fatigue she denies other complaints at this time.  Appetite and weight have remained stable.  ECOG PS- 1 Pain scale- 0 Opioid associated constipation- no  Review of systems- Review of Systems  Constitutional:  Positive for malaise/fatigue. Negative for chills, fever and weight loss.  HENT:  Negative for congestion, ear discharge and nosebleeds.   Eyes:  Negative for blurred vision.  Respiratory:  Negative for cough, hemoptysis, sputum production, shortness of breath and wheezing.   Cardiovascular:  Negative for chest pain, palpitations, orthopnea and claudication.  Gastrointestinal:  Negative for abdominal pain, blood in stool, constipation, diarrhea, heartburn, melena, nausea and vomiting.  Genitourinary:  Negative for dysuria, flank pain, frequency, hematuria and urgency.  Musculoskeletal:  Negative for back pain, joint pain and myalgias.  Skin:  Negative for rash.  Neurological:  Negative for dizziness, tingling, focal weakness, seizures, weakness and headaches.  Endo/Heme/Allergies:  Does not bruise/bleed easily.  Psychiatric/Behavioral:  Negative for depression and suicidal ideas. The patient does not have insomnia.       No Known Allergies   Past Medical History:  Diagnosis Date   Anemia    Anxiety    Arthritis    NECK   Cancer ([place])    BASAL CELL AND MELANOMA   DAVF (dural arteriovenous fistula) 2015   Dementia (HCC)    Family history of adverse reaction to anesthesia    niece has to come out of anesthesia slow   Hypertension    Pneumonia      Past Surgical History:  Procedure  Laterality Date   BRAIN SURGERY  2015   DURAL AV FISTULA ([facility])   BREAST BIOPSY Left 12/14/2021   [place] Bx 3:00 7 CMFN, Coil Clip, Path Pending   BREAST BIOPSY Left 12/14/2021   [place] Bx 10:00 2 CMFN, Venus Clip, path pending   breast biopsy Left 12/14/2021   [place] Axille, Hydromarker (butterfly). path pending   BREAST BIOPSY Right 12/19/2021   Stereo bx-calcs, "COIL"  clip-path pending   BREAST BIOPSY Right 12/19/2021   u/s bx-mass, 3:00, retroareolar, "HEART" clip-path pending   COLONOSCOPY     COLONOSCOPY WITH PROPOFOL N/A 02/20/2017   Procedure: COLONOSCOPY WITH PROPOFOL;  Surgeon: Manya Silvas, MD;  Location: [facility] ENDOSCOPY;  Service: Endoscopy;  Laterality: N/A;   HYSTEROSCOPY WITH D & C N/A 07/29/2015   Procedure: DILATATION AND CURETTAGE /HYSTEROSCOPY;  Surgeon: Benjaman Kindler, MD;  Location: [facility] ORS;  Service: Gynecology;  Laterality: N/A;   HYSTEROSCOPY WITH D & C N/A 01/24/2018   Procedure: DILATATION AND CURETTAGE /HYSTEROSCOPY;  Surgeon: Benjaman Kindler, MD;  Location: [facility] ORS;  Service: Gynecology;  Laterality: N/A;   LAPAROSCOPY N/A 01/24/2018   Procedure: LAPAROSCOPY DIAGNOSTIC;  Surgeon: Benjaman Kindler, MD;  Location: [facility] ORS;  Service: Gynecology;  Laterality: N/A;   PERIPHERAL VASCULAR THROMBECTOMY N/A 02/29/2020   Procedure: PERIPHERAL VASCULAR THROMBECTOMY / THROMBOLYSIS WITH POSSIBLE PULMONARY THROMBECTOMY / THROMBOLYSIS;  Surgeon: Algernon Huxley, MD;  Location: [place] [facility];  Service: Cardiovascular;  Laterality: N/A;   PORTACATH PLACEMENT N/A 01/08/2022   Procedure: INSERTION PORT-A-CATH;  Surgeon: Herbert Pun, MD;  Location: [facility] ORS;  Service: General;  Laterality: N/A;   TONSILLECTOMY     AGE 9   TOTAL LAPAROSCOPIC HYSTERECTOMY WITH BILATERAL SALPINGO OOPHORECTOMY Bilateral 12/28/2019   Procedure: TOTAL LAPAROSCOPIC HYSTERECTOMY WITH BILATERAL SALPINGO OOPHORECTOMY;  Surgeon: Benjaman Kindler, MD;  Location: [facility] ORS;  Service: Gynecology;  Laterality: Bilateral;   TUBAL LIGATION      Social History   Socioeconomic History   Marital status: Divorced    Spouse name: Not on file   Number of children: Not on file   Years of education: Not on file   Highest education level: Not on file  Occupational History   Not on file  Tobacco Use   Smoking status: Former    Packs/day: 0.50    Years: 15.00     Total pack years: 7.50    Types: Cigarettes    Quit date: 07/21/2000    Years since quitting: 21.6   Smokeless tobacco: Never  Vaping Use   Vaping Use: Never used  Substance and Sexual Activity   Alcohol use: Not Currently   Drug use: No   Sexual activity: Not Currently  Other Topics Concern   Not on file  Social History Narrative   Lives alone, caregiver 27 year old daughter   Social Determinants of Health   Financial Resource Strain: Not on file  Food Insecurity: Not on file  Transportation Needs: Not on file  Physical Activity: Not on file  Stress: Not on file  Social Connections: Not on file  Intimate Partner Violence: Not on file    Family History  Problem Relation Age of Onset   Stomach cancer Sister    Hypertension Sister    Hypertension Sister    Diabetes Niece    Hypertension Niece    Breast cancer Neg Hx      Current Outpatient Medications:    buPROPion (WELLBUTRIN XL) 150 MG 24 hr tablet, Take 150 mg  by mouth every morning., Disp: , Rfl: 6   cetirizine (ZYRTEC) 10 MG tablet, Take 10 mg by mouth daily as needed for allergies., Disp: , Rfl:    Calcium Carbonate-Vitamin D (OS-CAL 500 + D PO), Take 1 tablet by mouth daily.  (Patient not taking: Reported on 12/22/2021), Disp: , Rfl:    Cholecalciferol (DIALYVITE VITAMIN D 5000) 125 MCG (5000 UT) capsule, Take 5,000 Units by mouth daily. (Patient not taking: Reported on 12/22/2021), Disp: , Rfl:    docusate sodium (COLACE) 100 MG capsule, Take 1 capsule (100 mg total) by mouth 2 (two) times daily. To keep stools soft (Patient not taking: Reported on 02/29/2020), Disp: 30 capsule, Rfl: 0   estrogen, conjugated,-medroxyprogesterone (PREMPRO) 0.625-5 MG tablet, Take 1 tablet by mouth every evening.  (Patient not taking: Reported on 12/22/2021), Disp: , Rfl:    Lutein 40 MG CAPS, Take 40 mg by mouth daily. (Patient not taking: Reported on 12/22/2021), Disp: , Rfl:    oxyCODONE (OXY IR/ROXICODONE) 5 MG immediate release  tablet, Take 1 tablet (5 mg total) by mouth every 4 (four) hours as needed for severe pain. (Patient not taking: Reported on 02/29/2020), Disp: 20 tablet, Rfl: 0   oxyCODONE (ROXICODONE) 5 MG immediate release tablet, Take 1 tablet (5 mg total) by mouth every 4 (four) hours as needed for severe pain. (Patient not taking: Reported on 01/19/2022), Disp: 5 tablet, Rfl: 0   polyethylene glycol (MIRALAX / GLYCOLAX) 17 g packet, Take 17 g by mouth daily. (Patient not taking: Reported on 02/20/2022), Disp: , Rfl:    rivaroxaban (XARELTO) 20 MG TABS tablet, 1 tablet (20 mg total) daily with breakfast (Patient not taking: Reported on 01/19/2022), Disp: , Rfl:    vitamin B-12 (CYANOCOBALAMIN) 1000 MCG tablet, Take 1,000 mcg by mouth daily.  (Patient not taking: Reported on 12/22/2021), Disp: , Rfl:    XARELTO 20 MG TABS tablet, TAKE 1 TABLET BY MOUTH EVERY DAY (Patient not taking: Reported on 10/11/2020), Disp: 90 tablet, Rfl: 1 No current facility-administered medications for this visit.  Facility-Administered Medications Ordered in Other Visits:    heparin lock flush 100 unit/mL, 500 Units, Intravenous, Once, Sindy Guadeloupe, MD   sodium chloride flush (NS) 0.9 % injection 10 mL, 10 mL, Intravenous, Once, Sindy Guadeloupe, MD  Physical exam:  Vitals:   02/20/22 0856  BP: 133/81  Pulse: 77  Resp: 18  Temp: (!) 96.7 F (35.9 C)  SpO2: 100%  Weight: 101 lb 1.6 oz (45.9 kg)   Physical Exam Constitutional:      General: She is not in acute distress. Cardiovascular:     Rate and Rhythm: Normal rate and regular rhythm.     Heart sounds: Normal heart sounds.  Pulmonary:     Effort: Pulmonary effort is normal.     Breath sounds: Normal breath sounds.  Abdominal:     General: Bowel sounds are normal.     Palpations: Abdomen is soft.  Skin:    General: Skin is warm and dry.  Neurological:     Mental Status: She is alert and oriented to person, place, and time.    Breast exam: There is a palpable mass in  the left breast about 3 cm in size and appears somewhat smaller as compared to before.  No palpable left axillary adenopathy.      Latest Ref Rng & Units 02/20/2022    8:43 AM  CMP  Glucose 70 - 99 mg/dL 92   BUN 8 -  23 mg/dL 11   Creatinine 0.44 - 1.00 mg/dL 0.72   Sodium 135 - 145 mmol/L 141   Potassium 3.5 - 5.1 mmol/L 3.8   Chloride 98 - 111 mmol/L 112   CO2 22 - 32 mmol/L 26   Calcium 8.9 - 10.3 mg/dL 9.0   Total Protein 6.5 - 8.1 g/dL 6.5   Total Bilirubin 0.3 - 1.2 mg/dL 0.5   Alkaline Phos 38 - 126 U/L 75   AST 15 - 41 U/L 21   ALT 0 - 44 U/L 16       Latest Ref Rng & Units 02/20/2022    8:43 AM  CBC  WBC 4.0 - 10.5 K/uL 9.6   Hemoglobin 12.0 - 15.0 g/dL 12.2   Hematocrit 36.0 - 46.0 % 36.9   Platelets 150 - 400 K/uL 420      Assessment and plan- Patient is a 76 y.o. 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  She is here for on treatment assessment prior to cycle 3 of neoadjuvant AC Keytruda chemotherapy  Counts okay to proceed with cycle 3 of neoadjuvant AC Keytruda chemotherapy today with Udenyca on day 3.  I will see him back in 3 weeks for cycle 4.  This will be followed by interim ultrasound before we proceed with CarboTaxol Keytruda.  So far patient is tolerating treatments well.Clinically mass does appear somewhat smaller as compared to before.  Baseline TSH was mildly elevated at 5.05 but free T4 was normal.  We will repeat TSH and free T4 with next set of labs     Visit Diagnosis 1. Encounter for antineoplastic chemotherapy   2. Malignant neoplasm of upper-outer quadrant of left breast in female, estrogen receptor negative ([place])      Dr. Randa Evens, MD, MPH [facility] at [facility] [phone] 02/20/2022 9:09 AM

## 2022-02-20 NOTE — Progress Notes (Signed)
Pts niece states that pt has been recently struggling with emotions and feelings.

## 2022-02-20 NOTE — Patient Instructions (Signed)
[facility] AT [place]  Discharge Instructions: Thank you for choosing [facility] to provide your oncology and hematology care.  If you have a lab appointment with the [facility], please go directly to the [facility] and check in at the registration area.  Wear comfortable clothing and clothing appropriate for easy access to any Portacath or PICC line.   We strive to give you quality time with your provider. You may need to reschedule your appointment if you arrive late (15 or more minutes).  Arriving late affects you and other patients whose appointments are after yours.  Also, if you miss three or more appointments without notifying the office, you may be dismissed from the clinic at the provider's discretion.      For prescription refill requests, have your pharmacy contact our office and allow 72 hours for refills to be completed.    Today you received the following chemotherapy and/or immunotherapy agents Cyclophosphamide, DOXOrubicin, pembrolizumab.      To help prevent nausea and vomiting after your treatment, we encourage you to take your nausea medication as directed.  BELOW ARE SYMPTOMS THAT SHOULD BE REPORTED IMMEDIATELY: *FEVER GREATER THAN 100.4 F (38 C) OR HIGHER *CHILLS OR SWEATING *NAUSEA AND VOMITING THAT IS NOT CONTROLLED WITH YOUR NAUSEA MEDICATION *UNUSUAL SHORTNESS OF BREATH *UNUSUAL BRUISING OR BLEEDING *URINARY PROBLEMS (pain or burning when urinating, or frequent urination) *BOWEL PROBLEMS (unusual diarrhea, constipation, pain near the anus) TENDERNESS IN MOUTH AND THROAT WITH OR WITHOUT PRESENCE OF ULCERS (sore throat, sores in mouth, or a toothache) UNUSUAL RASH, SWELLING OR PAIN  UNUSUAL VAGINAL DISCHARGE OR ITCHING   Items with * indicate a potential emergency and should be followed up as soon as possible or go to the Emergency Department if any problems should occur.  Please show the CHEMOTHERAPY ALERT CARD or  IMMUNOTHERAPY ALERT CARD at check-in to the Emergency Department and triage nurse.  Should you have questions after your visit or need to cancel or reschedule your appointment, please contact [facility] [place] AT [place]  [phone] and follow the prompts.  Office hours are 8:00 a.m. to 4:30 p.m. Monday - Friday. Please note that voicemails left after 4:00 p.m. may not be returned until the following business day.  We are closed weekends and major holidays. You have access to a nurse at all times for urgent questions. Please call the main number to the clinic [phone] and follow the prompts.  For any non-urgent questions, you may also contact your provider using MyChart. We now offer e-Visits for anyone 75 and older to request care online for non-urgent symptoms. For details visit [email or web address].   Also download the MyChart app! Go to the app store, search "MyChart", open the app, select [facility], and log in with your MyChart username and password.  Masks are optional in the cancer centers. If you would like for your care team to wear a mask while they are taking care of you, please let them know. For doctor visits, patients may have with them one support person who is at least 78 years old. At this time, visitors are not allowed in the infusion area.  Cyclophosphamide Injection What is this medication? CYCLOPHOSPHAMIDE (sye kloe FOSS fa mide) treats some types of cancer. It works by slowing down the growth of cancer cells. This medicine may be used for other purposes; ask your health care provider or pharmacist if you have questions. COMMON BRAND NAME(S): Cyclophosphamide, Cytoxan, Neosar What should  I tell my care team before I take this medication? They need to know if you have any of these conditions: Heart disease Irregular heartbeat or rhythm Infection Kidney problems Liver disease Low blood cell levels (white cells, platelets, or red blood cells) Lung  disease Previous radiation Trouble passing urine An unusual or allergic reaction to cyclophosphamide, other medications, foods, dyes, or preservatives Pregnant or trying to get pregnant Breast-feeding How should I use this medication? This medication is injected into a vein. It is given by your care team in a hospital or clinic setting. Talk to your care team about the use of this medication in children. Special care may be needed. Overdosage: If you think you have taken too much of this medicine contact a poison control center or emergency room at once. NOTE: This medicine is only for you. Do not share this medicine with others. What if I miss a dose? Keep appointments for follow-up doses. It is important not to miss your dose. Call your care team if you are unable to keep an appointment. What may interact with this medication? Amphotericin B Amiodarone Azathioprine Certain antivirals for HIV or hepatitis Certain medications for blood pressure, such as enalapril, lisinopril, quinapril Cyclosporine Diuretics Etanercept Indomethacin Medications that relax muscles Metronidazole Natalizumab Tamoxifen Warfarin This list may not describe all possible interactions. Give your health care provider a list of all the medicines, herbs, non-prescription drugs, or dietary supplements you use. Also tell them if you smoke, drink alcohol, or use illegal drugs. Some items may interact with your medicine. What should I watch for while using this medication? This medication may make you feel generally unwell. This is not uncommon as chemotherapy can affect healthy cells as well as cancer cells. Report any side effects. Continue your course of treatment even though you feel ill unless your care team tells you to stop. You may need blood work while you are taking this medication. This medication may increase your risk of getting an infection. Call your care team for advice if you get a fever, chills, sore  throat, or other symptoms of a cold or flu. Do not treat yourself. Try to avoid being around people who are sick. Avoid taking medications that contain aspirin, acetaminophen, ibuprofen, naproxen, or ketoprofen unless instructed by your care team. These medications may hide a fever. Be careful brushing or flossing your teeth or using a toothpick because you may get an infection or bleed more easily. If you have any dental work done, tell your dentist you are receiving this medication. Drink water or other fluids as directed. Urinate often, even at night. Some products may contain alcohol. Ask your care team if this medication contains alcohol. Be sure to tell all care teams you are taking this medicine. Certain medicines, like metronidazole and disulfiram, can cause an unpleasant reaction when taken with alcohol. The reaction includes flushing, headache, nausea, vomiting, sweating, and increased thirst. The reaction can last from 30 minutes to several hours. Talk to your care team if you wish to become pregnant or think you might be pregnant. This medication can cause serious birth defects if taken during pregnancy and for 1 year after the last dose. A negative pregnancy test is required before starting this medication. A reliable form of contraception is recommended while taking this medication and for 1 year after the last dose. Talk to your care team about reliable forms of contraception. Do not father a child while taking this medication and for 4 months after the  last dose. Use a condom during this time period. Do not breast-feed while taking this medication or for 1 week after the last dose. This medication may cause infertility. Talk to your care team if you are concerned about your fertility. Talk to your care team about your risk of cancer. You may be more at risk for certain types of cancer if you take this medication. What side effects may I notice from receiving this medication? Side effects  that you should report to your care team as soon as possible: Allergic reactions--skin rash, itching, hives, swelling of the face, lips, tongue, or throat Dry cough, shortness of breath or trouble breathing Heart failure--shortness of breath, swelling of the ankles, feet, or hands, sudden weight gain, unusual weakness or fatigue Heart muscle inflammation--unusual weakness or fatigue, shortness of breath, chest pain, fast or irregular heartbeat, dizziness, swelling of the ankles, feet, or hands Heart rhythm changes--fast or irregular heartbeat, dizziness, feeling faint or lightheaded, chest pain, trouble breathing Infection--fever, chills, cough, sore throat, wounds that don't heal, pain or trouble when passing urine, general feeling of discomfort or being unwell Kidney injury--decrease in the amount of urine, swelling of the ankles, hands, or feet Liver injury--right upper belly pain, loss of appetite, nausea, light-colored stool, dark yellow or brown urine, yellowing skin or eyes, unusual weakness or fatigue Low red blood cell level--unusual weakness or fatigue, dizziness, headache, trouble breathing Low sodium level--muscle weakness, fatigue, dizziness, headache, confusion Red or dark brown urine Unusual bruising or bleeding Side effects that usually do not require medical attention (report to your care team if they continue or are bothersome): Hair loss Irregular menstrual cycles or spotting Loss of appetite Nausea Pain, redness, or swelling with sores inside the mouth or throat Vomiting This list may not describe all possible side effects. Call your doctor for medical advice about side effects. You may report side effects to FDA at 1-800-FDA-1088. Where should I keep my medication? This medication is given in a hospital or clinic. It will not be stored at home. NOTE: This sheet is a summary. It may not cover all possible information. If you have questions about this medicine, talk to your  doctor, pharmacist, or health care provider.  2023 Elsevier/Gold Standard (2021-09-06 00:00:00)  Doxorubicin Injection What is this medication? DOXORUBICIN (dox oh ROO bi sin) treats some types of cancer. It works by slowing down the growth of cancer cells. This medicine may be used for other purposes; ask your health care provider or pharmacist if you have questions. COMMON BRAND NAME(S): Adriamycin, Adriamycin PFS, Adriamycin RDF, Rubex What should I tell my care team before I take this medication? They need to know if you have any of these conditions: Heart disease History of low blood cell levels caused by a medication Liver disease Recent or ongoing radiation An unusual or allergic reaction to doxorubicin, other medications, foods, dyes, or preservatives If you or your partner are pregnant or trying to get pregnant Breast-feeding How should I use this medication? This medication is injected into a vein. It is given by your care team in a hospital or clinic setting. Talk to your care team about the use of this medication in children. Special care may be needed. Overdosage: If you think you have taken too much of this medicine contact a poison control center or emergency room at once. NOTE: This medicine is only for you. Do not share this medicine with others. What if I miss a dose? Keep appointments for follow-up doses.  It is important not to miss your dose. Call your care team if you are unable to keep an appointment. What may interact with this medication? 6-mercaptopurine Paclitaxel Phenytoin St. John's wort Trastuzumab Verapamil This list may not describe all possible interactions. Give your health care provider a list of all the medicines, herbs, non-prescription drugs, or dietary supplements you use. Also tell them if you smoke, drink alcohol, or use illegal drugs. Some items may interact with your medicine. What should I watch for while using this medication? Your  condition will be monitored carefully while you are receiving this medication. You may need blood work while taking this medication. This medication may make you feel generally unwell. This is not uncommon as chemotherapy can affect healthy cells as well as cancer cells. Report any side effects. Continue your course of treatment even though you feel ill unless your care team tells you to stop. There is a maximum amount of this medication you should receive throughout your life. The amount depends on the medical condition being treated and your overall health. Your care team will watch how much of this medication you receive. Tell your care team if you have taken this medication before. Your urine may turn red for a few days after your dose. This is not blood. If your urine is dark or brown, call your care team. In some cases, you may be given additional medications to help with side effects. Follow all directions for their use. This medication may increase your risk of getting an infection. Call your care team for advice if you get a fever, chills, sore throat, or other symptoms of a cold or flu. Do not treat yourself. Try to avoid being around people who are sick. This medication may increase your risk to bruise or bleed. Call your care team if you notice any unusual bleeding. Talk to your care team about your risk of cancer. You may be more at risk for certain types of cancers if you take this medication. You should make sure that you get enough Coenzyme Q10 while you are taking this medication. Discuss the foods you eat and the vitamins you take with your care team. Talk to your care team if you or your partner may be pregnant. Serious birth defects can occur if you take this medication during pregnancy and for 6 months after the last dose. Contraception is recommended while taking this medication and for 6 months after the last dose. Your care team can help you find the option that works for you. If  your partner can get pregnant, use a condom while taking this medication and for 6 months after the last dose. Do not breastfeed while taking this medication. This medication may cause infertility. Talk to your care team if you are concerned about your fertility. What side effects may I notice from receiving this medication? Side effects that you should report to your care team as soon as possible: Allergic reactions--skin rash, itching, hives, swelling of the face, lips, tongue, or throat Heart failure--shortness of breath, swelling of the ankles, feet, or hands, sudden weight gain, unusual weakness or fatigue Heart rhythm changes--fast or irregular heartbeat, dizziness, feeling faint or lightheaded, chest pain, trouble breathing Infection--fever, chills, cough, sore throat, wounds that don't heal, pain or trouble when passing urine, general feeling of discomfort or being unwell Low red blood cell level--unusual weakness or fatigue, dizziness, headache, trouble breathing Painful swelling, warmth, or redness of the skin, blisters or sores at the infusion site  Unusual bruising or bleeding Side effects that usually do not require medical attention (report to your care team if they continue or are bothersome): Diarrhea Hair loss Nausea Pain, redness, or swelling with sores inside the mouth or throat Red urine This list may not describe all possible side effects. Call your doctor for medical advice about side effects. You may report side effects to FDA at 1-800-FDA-1088. Where should I keep my medication? This medication is given in a hospital or clinic. It will not be stored at home. NOTE: This sheet is a summary. It may not cover all possible information. If you have questions about this medicine, talk to your doctor, pharmacist, or health care provider.  2023 Elsevier/Gold Standard (2021-10-04 00:00:00)  Pembrolizumab Injection What is this medication? PEMBROLIZUMAB (PEM broe LIZ ue mab)  treats some types of cancer. It works by helping your immune system slow or stop the spread of cancer cells. It is a monoclonal antibody. This medicine may be used for other purposes; ask your health care provider or pharmacist if you have questions. COMMON BRAND NAME(S): Keytruda What should I tell my care team before I take this medication? They need to know if you have any of these conditions: Allogeneic stem cell transplant (uses someone else's stem cells) Autoimmune diseases, such as Crohn disease, ulcerative colitis, lupus History of chest radiation Nervous system problems, such as Guillain-Barre syndrome, myasthenia gravis Organ transplant An unusual or allergic reaction to pembrolizumab, other medications, foods, dyes, or preservatives Pregnant or trying to get pregnant Breast-feeding How should I use this medication? This medication is injected into a vein. It is given by your care team in a hospital or clinic setting. A special MedGuide will be given to you before each treatment. Be sure to read this information carefully each time. Talk to your care team about the use of this medication in children. While it may be prescribed for children as young as 6 months for selected conditions, precautions do apply. Overdosage: If you think you have taken too much of this medicine contact a poison control center or emergency room at once. NOTE: This medicine is only for you. Do not share this medicine with others. What if I miss a dose? Keep appointments for follow-up doses. It is important not to miss your dose. Call your care team if you are unable to keep an appointment. What may interact with this medication? Interactions have not been studied. This list may not describe all possible interactions. Give your health care provider a list of all the medicines, herbs, non-prescription drugs, or dietary supplements you use. Also tell them if you smoke, drink alcohol, or use illegal drugs. Some  items may interact with your medicine. What should I watch for while using this medication? Your condition will be monitored carefully while you are receiving this medication. You may need blood work while taking this medication. This medication may cause serious skin reactions. They can happen weeks to months after starting the medication. Contact your care team right away if you notice fevers or flu-like symptoms with a rash. The rash may be red or purple and then turn into blisters or peeling of the skin. You may also notice a red rash with swelling of the face, lips, or lymph nodes in your neck or under your arms. Tell your care team right away if you have any change in your eyesight. Talk to your care team if you may be pregnant. Serious birth defects can occur if you take  this medication during pregnancy and for 4 months after the last dose. You will need a negative pregnancy test before starting this medication. Contraception is recommended while taking this medication and for 4 months after the last dose. Your care team can help you find the option that works for you. Do not breastfeed while taking this medication and for 4 months after the last dose. What side effects may I notice from receiving this medication? Side effects that you should report to your care team as soon as possible: Allergic reactions--skin rash, itching, hives, swelling of the face, lips, tongue, or throat Dry cough, shortness of breath or trouble breathing Eye pain, redness, irritation, or discharge with blurry or decreased vision Heart muscle inflammation--unusual weakness or fatigue, shortness of breath, chest pain, fast or irregular heartbeat, dizziness, swelling of the ankles, feet, or hands Hormone gland problems--headache, sensitivity to light, unusual weakness or fatigue, dizziness, fast or irregular heartbeat, increased sensitivity to cold or heat, excessive sweating, constipation, hair loss, increased thirst or  amount of urine, tremors or shaking, irritability Infusion reactions--chest pain, shortness of breath or trouble breathing, feeling faint or lightheaded Kidney injury (glomerulonephritis)--decrease in the amount of urine, red or dark brown urine, foamy or bubbly urine, swelling of the ankles, hands, or feet Liver injury--right upper belly pain, loss of appetite, nausea, light-colored stool, dark yellow or brown urine, yellowing skin or eyes, unusual weakness or fatigue Pain, tingling, or numbness in the hands or feet, muscle weakness, change in vision, confusion or trouble speaking, loss of balance or coordination, trouble walking, seizures Rash, fever, and swollen lymph nodes Redness, blistering, peeling, or loosening of the skin, including inside the mouth Sudden or severe stomach pain, bloody diarrhea, fever, nausea, vomiting Side effects that usually do not require medical attention (report to your care team if they continue or are bothersome): Bone, joint, or muscle pain Diarrhea Fatigue Loss of appetite Nausea Skin rash This list may not describe all possible side effects. Call your doctor for medical advice about side effects. You may report side effects to FDA at 1-800-FDA-1088. Where should I keep my medication? This medication is given in a hospital or clinic. It will not be stored at home. NOTE: This sheet is a summary. It may not cover all possible information. If you have questions about this medicine, talk to your doctor, pharmacist, or health care provider.  2023 Elsevier/Gold Standard (2021-09-18 00:00:00)

## 2022-02-22 ENCOUNTER — Inpatient Hospital Stay: Payer: PPO

## 2022-02-22 DIAGNOSIS — Z171 Estrogen receptor negative status [ER-]: Secondary | ICD-10-CM

## 2022-02-22 DIAGNOSIS — Z5112 Encounter for antineoplastic immunotherapy: Secondary | ICD-10-CM | POA: Diagnosis not present

## 2022-02-22 MED ORDER — PEGFILGRASTIM-CBQV 6 MG/0.6ML ~~LOC~~ SOSY
6.0000 mg | PREFILLED_SYRINGE | Freq: Once | SUBCUTANEOUS | Status: AC
  Administered 2022-02-22: 6 mg via SUBCUTANEOUS
  Filled 2022-02-22: qty 0.6

## 2022-03-12 MED FILL — Dexamethasone Sodium Phosphate Inj 100 MG/10ML: INTRAMUSCULAR | Qty: 1 | Status: AC

## 2022-03-12 MED FILL — Fosaprepitant Dimeglumine For IV Infusion 150 MG (Base Eq): INTRAVENOUS | Qty: 5 | Status: AC

## 2022-03-13 ENCOUNTER — Inpatient Hospital Stay: Payer: PPO | Attending: Oncology

## 2022-03-13 ENCOUNTER — Encounter: Payer: Self-pay | Admitting: Oncology

## 2022-03-13 ENCOUNTER — Inpatient Hospital Stay: Payer: PPO

## 2022-03-13 ENCOUNTER — Inpatient Hospital Stay (HOSPITAL_BASED_OUTPATIENT_CLINIC_OR_DEPARTMENT_OTHER): Payer: PPO | Admitting: Oncology

## 2022-03-13 VITALS — BP 115/76 | HR 87 | Resp 16 | Wt 98.1 lb

## 2022-03-13 DIAGNOSIS — Z171 Estrogen receptor negative status [ER-]: Secondary | ICD-10-CM | POA: Insufficient documentation

## 2022-03-13 DIAGNOSIS — Z5111 Encounter for antineoplastic chemotherapy: Secondary | ICD-10-CM | POA: Diagnosis not present

## 2022-03-13 DIAGNOSIS — C50412 Malignant neoplasm of upper-outer quadrant of left female breast: Secondary | ICD-10-CM

## 2022-03-13 DIAGNOSIS — Z5112 Encounter for antineoplastic immunotherapy: Secondary | ICD-10-CM | POA: Diagnosis not present

## 2022-03-13 DIAGNOSIS — Z79899 Other long term (current) drug therapy: Secondary | ICD-10-CM | POA: Insufficient documentation

## 2022-03-13 DIAGNOSIS — Z2989 Encounter for other specified prophylactic measures: Secondary | ICD-10-CM

## 2022-03-13 DIAGNOSIS — Z5189 Encounter for other specified aftercare: Secondary | ICD-10-CM | POA: Diagnosis not present

## 2022-03-13 DIAGNOSIS — Z95828 Presence of other vascular implants and grafts: Secondary | ICD-10-CM

## 2022-03-13 LAB — COMPREHENSIVE METABOLIC PANEL
ALT: 12 U/L (ref 0–44)
AST: 23 U/L (ref 15–41)
Albumin: 3.5 g/dL (ref 3.5–5.0)
Alkaline Phosphatase: 72 U/L (ref 38–126)
Anion gap: 7 (ref 5–15)
BUN: 14 mg/dL (ref 8–23)
CO2: 23 mmol/L (ref 22–32)
Calcium: 9.1 mg/dL (ref 8.9–10.3)
Chloride: 106 mmol/L (ref 98–111)
Creatinine, Ser: 0.87 mg/dL (ref 0.44–1.00)
GFR, Estimated: >60 mL/min (ref >60)
Glucose, Bld: 114 mg/dL — ABNORMAL HIGH (ref 70–99)
Potassium: 3.9 mmol/L (ref 3.5–5.1)
Sodium: 136 mmol/L (ref 135–145)
Total Bilirubin: 0.6 mg/dL (ref 0.3–1.2)
Total Protein: 6.6 g/dL (ref 6.5–8.1)

## 2022-03-13 LAB — CBC WITH DIFFERENTIAL/PLATELET
Abs Immature Granulocytes: 0.07 10*3/uL (ref 0.00–0.07)
Basophils Absolute: 0.1 10*3/uL (ref 0.0–0.1)
Basophils Relative: 1 %
Eosinophils Absolute: 0 10*3/uL (ref 0.0–0.5)
Eosinophils Relative: 0 %
HCT: 35.4 % — ABNORMAL LOW (ref 36.0–46.0)
Hemoglobin: 11.8 g/dL — ABNORMAL LOW (ref 12.0–15.0)
Immature Granulocytes: 1 %
Lymphocytes Relative: 6 %
Lymphs Abs: 0.5 10*3/uL — ABNORMAL LOW (ref 0.7–4.0)
MCH: 31.5 pg (ref 26.0–34.0)
MCHC: 33.3 g/dL (ref 30.0–36.0)
MCV: 94.4 fL (ref 80.0–100.0)
Monocytes Absolute: 0.9 10*3/uL (ref 0.1–1.0)
Monocytes Relative: 11 %
Neutro Abs: 6.8 10*3/uL (ref 1.7–7.7)
Neutrophils Relative %: 81 %
Platelets: 401 10*3/uL — ABNORMAL HIGH (ref 150–400)
RBC: 3.75 MIL/uL — ABNORMAL LOW (ref 3.87–5.11)
RDW: 16.2 % — ABNORMAL HIGH (ref 11.5–15.5)
WBC: 8.4 10*3/uL (ref 4.0–10.5)
nRBC: 0 % (ref 0.0–0.2)

## 2022-03-13 LAB — TSH: TSH: 6.258 u[IU]/mL — ABNORMAL HIGH (ref 0.350–4.500)

## 2022-03-13 LAB — T4, FREE: Free T4: 1.01 ng/dL (ref 0.61–1.12)

## 2022-03-13 MED ORDER — HEPARIN SOD (PORK) LOCK FLUSH 100 UNIT/ML IV SOLN
500.0000 [IU] | Freq: Once | INTRAVENOUS | Status: AC | PRN
  Filled 2022-03-13: qty 5

## 2022-03-13 MED ORDER — HEPARIN SOD (PORK) LOCK FLUSH 100 UNIT/ML IV SOLN
INTRAVENOUS | Status: AC
  Administered 2022-03-13: 500 [IU]
  Filled 2022-03-13: qty 5

## 2022-03-13 MED ORDER — SODIUM CHLORIDE 0.9% FLUSH
10.0000 mL | Freq: Once | INTRAVENOUS | Status: AC
  Administered 2022-03-13: 10 mL via INTRAVENOUS
  Filled 2022-03-13: qty 10

## 2022-03-13 MED ORDER — SODIUM CHLORIDE 0.9 % IV SOLN
150.0000 mg | Freq: Once | INTRAVENOUS | Status: AC
  Administered 2022-03-13: 150 mg via INTRAVENOUS
  Filled 2022-03-13: qty 150

## 2022-03-13 MED ORDER — DOXORUBICIN HCL CHEMO IV INJECTION 2 MG/ML
60.0000 mg/m2 | Freq: Once | INTRAVENOUS | Status: AC
  Administered 2022-03-13: 84 mg via INTRAVENOUS
  Filled 2022-03-13: qty 42

## 2022-03-13 MED ORDER — SODIUM CHLORIDE 0.9 % IV SOLN
10.0000 mg | Freq: Once | INTRAVENOUS | Status: AC
  Administered 2022-03-13: 10 mg via INTRAVENOUS
  Filled 2022-03-13: qty 10

## 2022-03-13 MED ORDER — SODIUM CHLORIDE 0.9 % IV SOLN
600.0000 mg/m2 | Freq: Once | INTRAVENOUS | Status: AC
  Administered 2022-03-13: 840 mg via INTRAVENOUS
  Filled 2022-03-13: qty 42

## 2022-03-13 MED ORDER — SODIUM CHLORIDE 0.9 % IV SOLN
200.0000 mg | Freq: Once | INTRAVENOUS | Status: AC
  Administered 2022-03-13: 200 mg via INTRAVENOUS
  Filled 2022-03-13: qty 8

## 2022-03-13 MED ORDER — SODIUM CHLORIDE 0.9 % IV SOLN
Freq: Once | INTRAVENOUS | Status: AC
  Filled 2022-03-13: qty 250

## 2022-03-13 MED ORDER — PALONOSETRON HCL INJECTION 0.25 MG/5ML
0.2500 mg | Freq: Once | INTRAVENOUS | Status: AC
  Administered 2022-03-13: 0.25 mg via INTRAVENOUS
  Filled 2022-03-13: qty 5

## 2022-03-13 NOTE — Patient Instructions (Signed)
[facility] CANCER CTR AT [place]  Discharge Instructions: Thank you for choosing [facility] to provide your oncology and hematology care.  If you have a lab appointment with the [facility], please go directly to the [facility] and check in at the registration area.  Wear comfortable clothing and clothing appropriate for easy access to any Portacath or PICC line.   We strive to give you quality time with your provider. You may need to reschedule your appointment if you arrive late (15 or more minutes).  Arriving late affects you and other patients whose appointments are after yours.  Also, if you miss three or more appointments without notifying the office, you may be dismissed from the clinic at the provider's discretion.      For prescription refill requests, have your pharmacy contact our office and allow 72 hours for refills to be completed.    Today you received the following chemotherapy and/or immunotherapy agents Adriamycin, Keytruda and Cytoxan       To help prevent nausea and vomiting after your treatment, we encourage you to take your nausea medication as directed.  BELOW ARE SYMPTOMS THAT SHOULD BE REPORTED IMMEDIATELY: *FEVER GREATER THAN 100.4 F (38 C) OR HIGHER *CHILLS OR SWEATING *NAUSEA AND VOMITING THAT IS NOT CONTROLLED WITH YOUR NAUSEA MEDICATION *UNUSUAL SHORTNESS OF BREATH *UNUSUAL BRUISING OR BLEEDING *URINARY PROBLEMS (pain or burning when urinating, or frequent urination) *BOWEL PROBLEMS (unusual diarrhea, constipation, pain near the anus) TENDERNESS IN MOUTH AND THROAT WITH OR WITHOUT PRESENCE OF ULCERS (sore throat, sores in mouth, or a toothache) UNUSUAL RASH, SWELLING OR PAIN  UNUSUAL VAGINAL DISCHARGE OR ITCHING   Items with * indicate a potential emergency and should be followed up as soon as possible or go to the Emergency Department if any problems should occur.  Please show the CHEMOTHERAPY ALERT CARD or IMMUNOTHERAPY  ALERT CARD at check-in to the Emergency Department and triage nurse.  Should you have questions after your visit or need to cancel or reschedule your appointment, please contact [facility] CANCER [place] AT [place]  [phone] and follow the prompts.  Office hours are 8:00 a.m. to 4:30 p.m. Monday - Friday. Please note that voicemails left after 4:00 p.m. may not be returned until the following business day.  We are closed weekends and major holidays. You have access to a nurse at all times for urgent questions. Please call the main number to the clinic [phone] and follow the prompts.  For any non-urgent questions, you may also contact your provider using MyChart. We now offer e-Visits for anyone 19 and older to request care online for non-urgent symptoms. For details visit [email or web address].   Also download the MyChart app! Go to the app store, search "MyChart", open the app, select [facility], and log in with your MyChart username and password.  Masks are optional in the cancer centers. If you would like for your care team to wear a mask while they are taking care of you, please let them know. For doctor visits, patients may have with them one support person who is at least 78 years old. At this time, visitors are not allowed in the infusion area.

## 2022-03-13 NOTE — Progress Notes (Signed)
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  Showed a irregular mass measuring 2.3 x 3.2 x 3.6 cm.  There were 2 other smaller masses in the left breast measuring 4 x 7 x 9 mm and 3 x 5 x 5 mm.  7 abnormal lymph nodes in the left axilla.  Indeterminate solid and cystic mass in the right breast in the retroareolar region measuring 1 x 1.2 x 1.9 cm as well as another 4.5 cm group of indeterminate microcalcifications in the lower right breast.  Patient had 2 left breast biopsy along with left lymph node biopsy.  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   PET CT scan showed hypermetabolic left breast mass and clusters of small but hypermetabolic left axillary and subpectoral lymph nodes.  1.2 x 0.9 cm subpleural nodule with an SUV of 1.6.   Patient is being treated as per keynote 522 regimen starting on 01/09/2022    Interval history-tolerating treatments well so far.   Other than mild fatigue she denies other complaints at this time   ECOG PS- 1 Pain scale- 0   Review of systems- Review of Systems  Constitutional:  Positive for malaise/fatigue. Negative for chills, fever and weight loss.  HENT:  Negative for congestion, ear discharge and nosebleeds.   Eyes:  Negative for blurred vision.  Respiratory:  Negative for cough, hemoptysis, sputum production, shortness of breath and wheezing.   Cardiovascular:  Negative for chest pain, palpitations, orthopnea and claudication.  Gastrointestinal:  Negative for abdominal pain, blood in stool, constipation, diarrhea, heartburn, melena, nausea and vomiting.  Genitourinary:  Negative for dysuria, flank pain, frequency, hematuria and urgency.  Musculoskeletal:  Negative for back pain, joint pain and myalgias.  Skin:  Negative for rash.  Neurological:  Negative for dizziness, tingling, focal weakness, seizures, weakness and headaches.  Endo/Heme/Allergies:  Does not bruise/bleed easily.  Psychiatric/Behavioral:  Negative for depression and suicidal ideas. The patient does not have insomnia.       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 path pending   BREAST BIOPSY Right 12/19/2021   Stereo bx-calcs, "COIL" clip-path pending   BREAST BIOPSY Right 12/19/2021   u/s bx-mass, 3:00,  retroareolar, "HEART" clip-path pending   COLONOSCOPY     COLONOSCOPY WITH PROPOFOL N/A 02/20/2017   Procedure: COLONOSCOPY WITH PROPOFOL;  Surgeon: Manya Silvas, MD;  Location: [facility] ENDOSCOPY;  Service: Endoscopy;  Laterality: N/A;   HYSTEROSCOPY WITH D & C N/A 07/29/2015   Procedure: DILATATION AND CURETTAGE /HYSTEROSCOPY;  Surgeon: Benjaman Kindler, MD;  Location: [facility] ORS;  Service: Gynecology;  Laterality: N/A;   HYSTEROSCOPY WITH D & C N/A 01/24/2018   Procedure: DILATATION AND CURETTAGE /HYSTEROSCOPY;  Surgeon: Benjaman Kindler, MD;  Location: [facility] ORS;  Service: Gynecology;  Laterality: N/A;   LAPAROSCOPY N/A 01/24/2018   Procedure: LAPAROSCOPY DIAGNOSTIC;  Surgeon: Benjaman Kindler, MD;  Location: [facility] ORS;  Service: Gynecology;  Laterality: N/A;   PERIPHERAL VASCULAR THROMBECTOMY N/A 02/29/2020   Procedure: PERIPHERAL VASCULAR THROMBECTOMY / THROMBOLYSIS WITH POSSIBLE PULMONARY THROMBECTOMY / THROMBOLYSIS;  Surgeon: Algernon Huxley, MD;  Location: [facility];  Service: Cardiovascular;  Laterality: N/A;   PORTACATH PLACEMENT N/A 01/08/2022   Procedure: INSERTION PORT-A-CATH;  Surgeon: Herbert Pun, MD;  Location: [facility] ORS;  Service: General;  Laterality: N/A;   TONSILLECTOMY     AGE 55   TOTAL LAPAROSCOPIC HYSTERECTOMY WITH BILATERAL SALPINGO OOPHORECTOMY Bilateral 12/28/2019   Procedure: TOTAL LAPAROSCOPIC HYSTERECTOMY WITH BILATERAL SALPINGO OOPHORECTOMY;  Surgeon: Benjaman Kindler, MD;  Location: [facility] ORS;  Service: Gynecology;  Laterality: Bilateral;   TUBAL LIGATION      Social History   Socioeconomic History   Marital status: Divorced    Spouse name: Not on file   Number of children: Not on file   Years of education: Not on file   Highest education level: Not on file  Occupational History   Not on file  Tobacco Use   Smoking status: Former    Packs/day: 0.50    Years: 15.00    Total pack years: 7.50    Types: Cigarettes    Quit date: 07/21/2000     Years since quitting: 21.6   Smokeless tobacco: Never  Vaping Use   Vaping Use: Never used  Substance and Sexual Activity   Alcohol use: Not Currently   Drug use: No   Sexual activity: Not Currently  Other Topics Concern   Not on file  Social History Narrative   Lives alone, caregiver 58 year old daughter   Social Determinants of Health   Financial Resource Strain: Not on file  Food Insecurity: Not on file  Transportation Needs: Not on file  Physical Activity: Not on file  Stress: Not on file  Social Connections: Not on file  Intimate Partner Violence: Not on file    Family History  Problem Relation Age of Onset   Stomach cancer Sister    Hypertension Sister    Hypertension Sister    Diabetes Niece    Hypertension Niece    Breast cancer Neg Hx      Current Outpatient Medications:    buPROPion (WELLBUTRIN XL) 150 MG 24 hr tablet, Take 150 mg by mouth every morning., Disp: , Rfl:  6   Calcium Carbonate-Vitamin D (OS-CAL 500 + D PO), Take 1 tablet by mouth daily.  (Patient not taking: Reported on 12/22/2021), Disp: , Rfl:    cetirizine (ZYRTEC) 10 MG tablet, Take 10 mg by mouth daily as needed for allergies., Disp: , Rfl:    Cholecalciferol (DIALYVITE VITAMIN D 5000) 125 MCG (5000 UT) capsule, Take 5,000 Units by mouth daily. (Patient not taking: Reported on 12/22/2021), Disp: , Rfl:    docusate sodium (COLACE) 100 MG capsule, Take 1 capsule (100 mg total) by mouth 2 (two) times daily. To keep stools soft (Patient not taking: Reported on 02/29/2020), Disp: 30 capsule, Rfl: 0   estrogen, conjugated,-medroxyprogesterone (PREMPRO) 0.625-5 MG tablet, Take 1 tablet by mouth every evening.  (Patient not taking: Reported on 12/22/2021), Disp: , Rfl:    Lutein 40 MG CAPS, Take 40 mg by mouth daily. (Patient not taking: Reported on 12/22/2021), Disp: , Rfl:    ondansetron (ZOFRAN) 8 MG tablet, Take by mouth., Disp: , Rfl:    oxyCODONE (OXY IR/ROXICODONE) 5 MG immediate release tablet, Take  1 tablet (5 mg total) by mouth every 4 (four) hours as needed for severe pain. (Patient not taking: Reported on 02/29/2020), Disp: 20 tablet, Rfl: 0   oxyCODONE (ROXICODONE) 5 MG immediate release tablet, Take 1 tablet (5 mg total) by mouth every 4 (four) hours as needed for severe pain. (Patient not taking: Reported on 01/19/2022), Disp: 5 tablet, Rfl: 0   polyethylene glycol (MIRALAX / GLYCOLAX) 17 g packet, Take 17 g by mouth daily. (Patient not taking: Reported on 02/20/2022), Disp: , Rfl:    rivaroxaban (XARELTO) 20 MG TABS tablet, 1 tablet (20 mg total) daily with breakfast (Patient not taking: Reported on 01/19/2022), Disp: , Rfl:    vitamin B-12 (CYANOCOBALAMIN) 1000 MCG tablet, Take 1,000 mcg by mouth daily.  (Patient not taking: Reported on 12/22/2021), Disp: , Rfl:    XARELTO 20 MG TABS tablet, TAKE 1 TABLET BY MOUTH EVERY DAY (Patient not taking: Reported on 10/11/2020), Disp: 90 tablet, Rfl: 1  Physical exam:  Vitals:   03/13/22 0848  BP: 115/76  Pulse: 87  Resp: 16  SpO2: 100%  Weight: 98 lb 1.6 oz (44.5 kg)   Physical Exam Constitutional:      General: She is not in acute distress. Eyes:     Pupils: Pupils are equal, round, and reactive to light.  Cardiovascular:     Rate and Rhythm: Normal rate and regular rhythm.     Heart sounds: Normal heart sounds.  Pulmonary:     Effort: Pulmonary effort is normal.     Breath sounds: Normal breath sounds.  Abdominal:     General: Bowel sounds are normal.     Palpations: Abdomen is soft.  Skin:    General: Skin is warm and dry.  Neurological:     Mental Status: She is alert and oriented to person, place, and time.   Breast exam: There is a palpable 3 cm mass at the 3 o'clock position of the left breast which appears somewhat smaller as compared to before.  No palpable left axillary adenopathy.     Latest Ref Rng & Units 02/20/2022    8:43 AM  CMP  Glucose 70 - 99 mg/dL 92   BUN 8 - 23 mg/dL 11   Creatinine 0.44 - 1.00 mg/dL 0.72    Sodium 135 - 145 mmol/L 141   Potassium 3.5 - 5.1 mmol/L 3.8   Chloride 98 - 111  mmol/L 112   CO2 22 - 32 mmol/L 26   Calcium 8.9 - 10.3 mg/dL 9.0   Total Protein 6.5 - 8.1 g/dL 6.5   Total Bilirubin 0.3 - 1.2 mg/dL 0.5   Alkaline Phos 38 - 126 U/L 75   AST 15 - 41 U/L 21   ALT 0 - 44 U/L 16       Latest Ref Rng & Units 03/13/2022    8:22 AM  CBC  WBC 4.0 - 10.5 K/uL 8.4   Hemoglobin 12.0 - 15.0 g/dL 11.8   Hematocrit 36.0 - 46.0 % 35.4   Platelets 150 - 400 K/uL 401     Assessment and plan- Patient is a 76 y.o. 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  She is here for on treatment assessment prior to cycle 4 of neoadjuvant AC Keytruda chemotherapy  Counts okay to proceed with cycle 4 of neoadjuvant AC Keytruda chemotherapy today with Udenyca on day 3.  She will return to clinic in 3 weeks with port labs CBC with differential CMP and see covering NP for cycle 1 day 1 of CarboTaxol Keytruda chemotherapy.  I will see her back 4 weeks from now for cycle 1 day 8 of CarboTaxol chemotherapy.  She will need interim ultrasound to assess response to treatment so far.  Discussed risks and benefits of CarboTaxol chemotherapy including all but not limited to nausea, vomiting, low blood counts, risk of infections and hospitalizations.  Risk of peripheral neuropathy and infusion reaction associated with both carboplatin and Taxol.  Treatment will be given with a curative intent.  Patient understands and agrees to proceed as planned.  Mild normocytic anemia: Likely secondary to chemotherapy continue to monitor  Patient has a mildly elevated TSH of 6 with a normal free T4. I am holding off on starting any levothyroxine at this time.  If TSH consistently goes more than 10 I will consider starting her on 50 mcg of levothyroxine at that time    Visit Diagnosis 1. Encounter for antineoplastic  chemotherapy   2. Malignant neoplasm of upper-outer quadrant of left breast in female, estrogen receptor negative ([place])      Dr. Randa Evens, MD, MPH [facility] at [facility] [phone] 03/13/2022 8:42 AM

## 2022-03-14 ENCOUNTER — Other Ambulatory Visit: Payer: Self-pay

## 2022-03-15 ENCOUNTER — Inpatient Hospital Stay: Payer: PPO

## 2022-03-15 ENCOUNTER — Other Ambulatory Visit: Payer: Self-pay

## 2022-03-15 DIAGNOSIS — Z171 Estrogen receptor negative status [ER-]: Secondary | ICD-10-CM

## 2022-03-15 DIAGNOSIS — Z5112 Encounter for antineoplastic immunotherapy: Secondary | ICD-10-CM | POA: Diagnosis not present

## 2022-03-15 MED ORDER — PEGFILGRASTIM-CBQV 6 MG/0.6ML ~~LOC~~ SOSY
6.0000 mg | PREFILLED_SYRINGE | Freq: Once | SUBCUTANEOUS | Status: AC
  Administered 2022-03-15: 6 mg via SUBCUTANEOUS
  Filled 2022-03-15: qty 0.6

## 2022-03-28 ENCOUNTER — Ambulatory Visit
Admission: RE | Admit: 2022-03-28 | Discharge: 2022-03-28 | Disposition: A | Discharge status: Home / Self Care | Payer: PPO | Source: Ambulatory Visit | Attending: Oncology | Admitting: Oncology

## 2022-03-28 DIAGNOSIS — Z171 Estrogen receptor negative status [ER-]: Secondary | ICD-10-CM | POA: Diagnosis not present

## 2022-03-28 DIAGNOSIS — N6321 Unspecified lump in the left breast, upper outer quadrant: Secondary | ICD-10-CM | POA: Diagnosis not present

## 2022-03-28 DIAGNOSIS — C50412 Malignant neoplasm of upper-outer quadrant of left female breast: Secondary | ICD-10-CM | POA: Diagnosis not present

## 2022-03-28 DIAGNOSIS — N6323 Unspecified lump in the left breast, lower outer quadrant: Secondary | ICD-10-CM | POA: Diagnosis not present

## 2022-04-03 ENCOUNTER — Inpatient Hospital Stay (HOSPITAL_BASED_OUTPATIENT_CLINIC_OR_DEPARTMENT_OTHER): Payer: PPO | Admitting: Nurse Practitioner

## 2022-04-03 ENCOUNTER — Encounter: Payer: Self-pay | Admitting: Nurse Practitioner

## 2022-04-03 ENCOUNTER — Inpatient Hospital Stay: Payer: PPO

## 2022-04-03 VITALS — BP 153/117 | HR 71 | Temp 97.8°F | Resp 16 | Ht 59.0 in | Wt 93.2 lb

## 2022-04-03 DIAGNOSIS — Z5111 Encounter for antineoplastic chemotherapy: Secondary | ICD-10-CM

## 2022-04-03 DIAGNOSIS — Z5112 Encounter for antineoplastic immunotherapy: Secondary | ICD-10-CM

## 2022-04-03 DIAGNOSIS — Z2989 Encounter for other specified prophylactic measures: Secondary | ICD-10-CM

## 2022-04-03 DIAGNOSIS — Z171 Estrogen receptor negative status [ER-]: Secondary | ICD-10-CM

## 2022-04-03 DIAGNOSIS — C50412 Malignant neoplasm of upper-outer quadrant of left female breast: Secondary | ICD-10-CM | POA: Diagnosis not present

## 2022-04-03 LAB — COMPREHENSIVE METABOLIC PANEL
ALT: 15 U/L (ref 0–44)
AST: 23 U/L (ref 15–41)
Albumin: 3.4 g/dL — ABNORMAL LOW (ref 3.5–5.0)
Alkaline Phosphatase: 69 U/L (ref 38–126)
Anion gap: 6 (ref 5–15)
BUN: 14 mg/dL (ref 8–23)
CO2: 24 mmol/L (ref 22–32)
Calcium: 9 mg/dL (ref 8.9–10.3)
Chloride: 109 mmol/L (ref 98–111)
Creatinine, Ser: 0.83 mg/dL (ref 0.44–1.00)
GFR, Estimated: >60 mL/min (ref >60)
Glucose, Bld: 99 mg/dL (ref 70–99)
Potassium: 4.1 mmol/L (ref 3.5–5.1)
Sodium: 139 mmol/L (ref 135–145)
Total Bilirubin: 0.7 mg/dL (ref 0.3–1.2)
Total Protein: 6.6 g/dL (ref 6.5–8.1)

## 2022-04-03 LAB — CBC WITH DIFFERENTIAL/PLATELET
Abs Immature Granulocytes: 0.17 10*3/uL — ABNORMAL HIGH (ref 0.00–0.07)
Basophils Absolute: 0.1 10*3/uL (ref 0.0–0.1)
Basophils Relative: 2 %
Eosinophils Absolute: 0 10*3/uL (ref 0.0–0.5)
Eosinophils Relative: 0 %
HCT: 33 % — ABNORMAL LOW (ref 36.0–46.0)
Hemoglobin: 10.4 g/dL — ABNORMAL LOW (ref 12.0–15.0)
Immature Granulocytes: 2 %
Lymphocytes Relative: 5 %
Lymphs Abs: 0.5 10*3/uL — ABNORMAL LOW (ref 0.7–4.0)
MCH: 30.5 pg (ref 26.0–34.0)
MCHC: 31.5 g/dL (ref 30.0–36.0)
MCV: 96.8 fL (ref 80.0–100.0)
Monocytes Absolute: 1.1 10*3/uL — ABNORMAL HIGH (ref 0.1–1.0)
Monocytes Relative: 11 %
Neutro Abs: 7.4 10*3/uL (ref 1.7–7.7)
Neutrophils Relative %: 80 %
Platelets: 453 10*3/uL — ABNORMAL HIGH (ref 150–400)
RBC: 3.41 MIL/uL — ABNORMAL LOW (ref 3.87–5.11)
RDW: 15.6 % — ABNORMAL HIGH (ref 11.5–15.5)
WBC: 9.3 10*3/uL (ref 4.0–10.5)
nRBC: 0 % (ref 0.0–0.2)

## 2022-04-03 LAB — TSH: TSH: 3.696 u[IU]/mL (ref 0.350–4.500)

## 2022-04-03 LAB — T4, FREE: Free T4: 1.16 ng/dL — ABNORMAL HIGH (ref 0.61–1.12)

## 2022-04-03 MED ORDER — SODIUM CHLORIDE 0.9 % IV SOLN
105.7500 mg | Freq: Once | INTRAVENOUS | Status: AC
  Administered 2022-04-03: 110 mg via INTRAVENOUS
  Filled 2022-04-03: qty 11

## 2022-04-03 MED ORDER — DULOXETINE HCL 30 MG PO CPEP: ORAL_CAPSULE | ORAL | 0 refills | Status: DC

## 2022-04-03 MED ORDER — HEPARIN SOD (PORK) LOCK FLUSH 100 UNIT/ML IV SOLN
500.0000 [IU] | Freq: Once | INTRAVENOUS | Status: AC | PRN
  Administered 2022-04-03: 500 [IU]
  Filled 2022-04-03: qty 5

## 2022-04-03 MED ORDER — DIPHENHYDRAMINE HCL 50 MG/ML IJ SOLN
50.0000 mg | Freq: Once | INTRAMUSCULAR | Status: AC
  Administered 2022-04-03: 50 mg via INTRAVENOUS
  Filled 2022-04-03: qty 1

## 2022-04-03 MED ORDER — BENZONATATE 200 MG PO CAPS: 200.0000 mg | ORAL_CAPSULE | Freq: Three times a day (TID) | ORAL | 0 refills | Status: DC | PRN

## 2022-04-03 MED ORDER — SODIUM CHLORIDE 0.9 % IV SOLN
200.0000 mg | Freq: Once | INTRAVENOUS | Status: AC
  Administered 2022-04-03: 200 mg via INTRAVENOUS
  Filled 2022-04-03: qty 8

## 2022-04-03 MED ORDER — SODIUM CHLORIDE 0.9 % IV SOLN
80.0000 mg/m2 | Freq: Once | INTRAVENOUS | Status: AC
  Administered 2022-04-03: 114 mg via INTRAVENOUS
  Filled 2022-04-03: qty 19

## 2022-04-03 MED ORDER — SODIUM CHLORIDE 0.9 % IV SOLN
10.0000 mg | Freq: Once | INTRAVENOUS | Status: AC
  Administered 2022-04-03: 10 mg via INTRAVENOUS
  Filled 2022-04-03: qty 10

## 2022-04-03 MED ORDER — FAMOTIDINE IN NACL 20-0.9 MG/50ML-% IV SOLN
20.0000 mg | Freq: Once | INTRAVENOUS | Status: AC
  Administered 2022-04-03: 20 mg via INTRAVENOUS
  Filled 2022-04-03: qty 50

## 2022-04-03 MED ORDER — SODIUM CHLORIDE 0.9 % IV SOLN
Freq: Once | INTRAVENOUS | Status: AC
  Filled 2022-04-03: qty 250

## 2022-04-03 MED ORDER — PALONOSETRON HCL INJECTION 0.25 MG/5ML
0.2500 mg | Freq: Once | INTRAVENOUS | Status: AC
  Administered 2022-04-03: 0.25 mg via INTRAVENOUS
  Filled 2022-04-03: qty 5

## 2022-04-03 NOTE — Patient Instructions (Signed)
[facility]CER [facility] AT [place]COLOG[facility]  Discharge Instructions: Thank you for choosing [facility] to provide your oncology and hematology care.  If you have a lab appointment with the Cancer Center, please go directly to the Cancer Center and check in at the registration area.  Wear comfortable clothing and clothing appropriate for easy access to any Portacath or PICC line.   We strive to give you quality time with your provider. You may need to reschedule your appointment if you arrive late (15 or more minutes).  Arriving late affects you and other patients whose appointments are after yours.  Also, if you miss three or more appointments without notifying the office, you may be dismissed from the clinic at the provider's discretion.      For prescription refill requests, have your pharmacy contact our office and allow 72 hours for refills to be completed.    Today you received the following chemotherapy and/or immunotherapy agents Keytruda, Taxol and Carboplatin       To help prevent nausea and vomiting after your treatment, we encourage you to take your nausea medication as directed.  BELOW ARE SYMPTOMS THAT SHOULD BE REPORTED IMMEDIATELY: *FEVER GREATER THAN 100.4 F (38 C) OR HIGHER *CHILLS OR SWEATING *NAUSEA AND VOMITING THAT IS NOT CONTROLLED WITH YOUR NAUSEA MEDICATION *UNUSUAL SHORTNESS OF BREATH *UNUSUAL BRUISING OR BLEEDING *URINARY PROBLEMS (pain or burning when urinating, or frequent urination) *BOWEL PROBLEMS (unusual diarrhea, constipation, pain near the anus) TENDERNESS IN MOUTH AND THROAT WITH OR WITHOUT PRESENCE OF ULCERS (sore throat, sores in mouth, or a toothache) UNUSUAL RASH, SWELLING OR PAIN  UNUSUAL VAGINAL DISCHARGE OR ITCHING   Items with * indicate a potential emergency and should be followed up as soon as possible or go to the Emergency Department if any problems should occur.  Please show the CHEMOTHERAPY ALERT CARD or IMMUNOTHERAPY  ALERT CARD at check-in to the Emergency Department and triage nurse.  Should you have questions after your visit or need to cancel or reschedule your appointment, please contact [facility] AT [place]  [phone] and follow the prompts.  Office hours are 8:00 a.m. to 4:30 p.m. Monday - Friday. Please note that voicemails left after 4:00 p.m. may not be returned until the following business day.  We are closed weekends and major holidays. You have access to a nurse at all times for urgent questions. Please call the main number to the clinic [phone] and follow the prompts.  For any non-urgent questions, you may also contact your provider using MyChart. We now offer e-Visits for anyone 18 and older to request care online for non-urgent symptoms. For details visit [email or web address].   Also download the MyChart app! Go to the app store, search "MyChart", open the app, select [facility], and log in with your MyChart username and password.  Masks are optional in the cancer centers. If you would like for your care team to wear a mask while they are taking care of you, please let them know. For doctor visits, patients may have with them one support person who is at least 78 years old. At this time, visitors are not allowed in the infusion area.   

## 2022-04-03 NOTE — Progress Notes (Signed)
Current carbo dosing using diff weight and SrCr of 0.75.  Pharmacy got Carbo dose of ~'84mg'$  when using current weight and SrCr of 1 due to age.  MD on PAL.  Provider on call - leave current dose as is and let provider make decision for next time.

## 2022-04-03 NOTE — Progress Notes (Signed)
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  Showed a irregular mass measuring 2.3 x 3.2 x 3.6 cm.  There were 2 other smaller masses in the left breast measuring 4 x 7 x 9 mm and 3 x 5 x 5 mm.  7 abnormal lymph nodes in the left axilla.  Indeterminate solid and cystic mass in the right breast in the retroareolar region measuring 1 x 1.2 x 1.9 cm as well as another 4.5 cm group of indeterminate microcalcifications in the lower right breast.  Patient had 2 left breast biopsy along with left lymph node biopsy.  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   PET CT scan showed hypermetabolic left breast mass and clusters of small but hypermetabolic left axillary and subpectoral lymph nodes.  1.2 x 0.9 cm subpleural nodule with an SUV of 1.6.   Patient is being treated as per keynote 522 regimen starting on 01/09/2022    Interval history- Patient is 76 year old female who returns to  clinic for consideration of cycle 5 of carboplatin-paclitaxel-Keytruda.  She has completed Adriamycin-Cytoxan-pembrolizumab with Udenyca support for 4 cycles.  She has been tolerating treatment well.  Reports worsening depression and fatigue. Appetite waxes and wanes. Plans to use cold packs for prevention of neuropathy.   ECOG PS- 1 Pain scale- 0   Review of systems- Review of Systems  Constitutional:  Positive for malaise/fatigue. Negative for chills, fever and weight loss.  HENT:  Negative for congestion, ear discharge and nosebleeds.   Eyes:  Negative for blurred vision.  Respiratory:  Negative for cough, hemoptysis, sputum production, shortness of breath and wheezing.   Cardiovascular:  Negative for chest pain, palpitations, orthopnea and claudication.  Gastrointestinal:  Negative for abdominal pain, blood in stool, constipation, diarrhea, heartburn, melena, nausea and vomiting.  Genitourinary:  Negative for dysuria, flank pain, frequency, hematuria and urgency.  Musculoskeletal:  Negative for back pain, joint pain and myalgias.  Skin:  Negative for rash.  Neurological:  Negative for dizziness, tingling, focal weakness, seizures, weakness and headaches.  Endo/Heme/Allergies:  Does not bruise/bleed easily.  Psychiatric/Behavioral:  Positive for depression. Negative for suicidal ideas. The patient does not have insomnia.     No Known Allergies  Past Medical History:  Diagnosis Date   Anemia    Anxiety    Arthritis    NECK   Cancer ([place])    BASAL CELL AND MELANOMA   DAVF (dural arteriovenous fistula) 2015   Dementia (HCC)    Family history of adverse reaction to anesthesia  niece has to come out of anesthesia slow   Hypertension    Pneumonia    Past Surgical History:  Procedure Laterality Date   BRAIN SURGERY  2015   DURAL AV FISTULA ([facility])   BREAST BIOPSY Left 12/14/2021   [place] Bx 3:00 7 CMFN, Coil Clip, Path Pending   BREAST BIOPSY Left 12/14/2021   [place] Bx 10:00  2 CMFN, Venus Clip, path pending   breast biopsy Left 12/14/2021   [place] Axille, Hydromarker (butterfly). path pending   BREAST BIOPSY Right 12/19/2021   Stereo bx-calcs, "COIL" clip-path pending   BREAST BIOPSY Right 12/19/2021   u/s bx-mass, 3:00, retroareolar, "HEART" clip-path pending   COLONOSCOPY     COLONOSCOPY WITH PROPOFOL N/A 02/20/2017   Procedure: COLONOSCOPY WITH PROPOFOL;  Surgeon: Manya Silvas, MD;  Location: [facility] ENDOSCOPY;  Service: Endoscopy;  Laterality: N/A;   HYSTEROSCOPY WITH D & C N/A 07/29/2015   Procedure: DILATATION AND CURETTAGE /HYSTEROSCOPY;  Surgeon: Benjaman Kindler, MD;  Location: [facility] ORS;  Service: Gynecology;  Laterality: N/A;   HYSTEROSCOPY WITH D & C N/A 01/24/2018   Procedure: DILATATION AND CURETTAGE /HYSTEROSCOPY;  Surgeon: Benjaman Kindler, MD;  Location: [facility] ORS;  Service: Gynecology;  Laterality: N/A;   LAPAROSCOPY N/A 01/24/2018   Procedure: LAPAROSCOPY DIAGNOSTIC;  Surgeon: Benjaman Kindler, MD;  Location: [facility] ORS;  Service: Gynecology;  Laterality: N/A;   PERIPHERAL VASCULAR THROMBECTOMY N/A 02/29/2020   Procedure: PERIPHERAL VASCULAR THROMBECTOMY / THROMBOLYSIS WITH POSSIBLE PULMONARY THROMBECTOMY / THROMBOLYSIS;  Surgeon: Algernon Huxley, MD;  Location: [place] [facility];  Service: Cardiovascular;  Laterality: N/A;   PORTACATH PLACEMENT N/A 01/08/2022   Procedure: INSERTION PORT-A-CATH;  Surgeon: Herbert Pun, MD;  Location: [facility] ORS;  Service: General;  Laterality: N/A;   TONSILLECTOMY     AGE 4   TOTAL LAPAROSCOPIC HYSTERECTOMY WITH BILATERAL SALPINGO OOPHORECTOMY Bilateral 12/28/2019   Procedure: TOTAL LAPAROSCOPIC HYSTERECTOMY WITH BILATERAL SALPINGO OOPHORECTOMY;  Surgeon: Benjaman Kindler, MD;  Location: [facility] ORS;  Service: Gynecology;  Laterality: Bilateral;   TUBAL LIGATION     Social History   Socioeconomic History   Marital status: Divorced    Spouse name: Not on file   Number of children: Not on file   Years of  education: Not on file   Highest education level: Not on file  Occupational History   Not on file  Tobacco Use   Smoking status: Former    Packs/day: 0.50    Years: 15.00    Total pack years: 7.50    Types: Cigarettes    Quit date: 07/21/2000    Years since quitting: 21.7   Smokeless tobacco: Never  Vaping Use   Vaping Use: Never used  Substance and Sexual Activity   Alcohol use: Not Currently   Drug use: No   Sexual activity: Not Currently  Other Topics Concern   Not on file  Social History Narrative   Lives alone, caregiver 70 year old daughter   Social Determinants of Health   Financial Resource Strain: Not on file  Food Insecurity: Not on file  Transportation Needs: Not on file  Physical Activity: Not on file  Stress: Not on file  Social Connections: Not on file  Intimate Partner Violence: Not on file   Family History  Problem Relation Age of Onset   Stomach cancer Sister    Hypertension Sister    Hypertension Sister    Diabetes Niece    Hypertension Niece    Breast cancer Neg Hx  Current Outpatient Medications:    buPROPion (WELLBUTRIN XL) 150 MG 24 hr tablet, Take 150 mg by mouth every morning., Disp: , Rfl: 6   lidocaine-prilocaine (EMLA) cream, Apply 1 Application topically as needed., Disp: , Rfl:    ondansetron (ZOFRAN) 8 MG tablet, Take by mouth., Disp: , Rfl:    cetirizine (ZYRTEC) 10 MG tablet, Take 10 mg by mouth daily as needed for allergies. (Patient not taking: Reported on 04/03/2022), Disp: , Rfl:    polyethylene glycol (MIRALAX / GLYCOLAX) 17 g packet, Take 17 g by mouth daily. (Patient not taking: Reported on 04/03/2022), Disp: , Rfl:   Physical exam:  Vitals:   04/03/22 0901  BP: (!) 153/117  Pulse: 71  Resp: 16  Temp: 97.8 F (36.6 C)  TempSrc: Oral  Weight: 93 lb 3.2 oz (42.3 kg)  Height: _0  (1.499 m)   Physical Exam Constitutional:      General: She is not in acute distress. Eyes:     Pupils: Pupils are equal, round, and  reactive to light.  Cardiovascular:     Rate and Rhythm: Normal rate and regular rhythm.     Heart sounds: Normal heart sounds.  Pulmonary:     Effort: Pulmonary effort is normal.     Breath sounds: Normal breath sounds.  Abdominal:     General: Bowel sounds are normal.     Palpations: Abdomen is soft.  Skin:    General: Skin is warm and dry.  Neurological:     Mental Status: She is alert and oriented to person, place, and time.   Breast: deferred     Latest Ref Rng & Units 04/03/2022    8:18 AM  CMP  Glucose 70 - 99 mg/dL 99   BUN 8 - 23 mg/dL 14   Creatinine 0.44 - 1.00 mg/dL 0.83   Sodium 135 - 145 mmol/L 139   Potassium 3.5 - 5.1 mmol/L 4.1   Chloride 98 - 111 mmol/L 109   CO2 22 - 32 mmol/L 24   Calcium 8.9 - 10.3 mg/dL 9.0   Total Protein 6.5 - 8.1 g/dL 6.6   Total Bilirubin 0.3 - 1.2 mg/dL 0.7   Alkaline Phos 38 - 126 U/L 69   AST 15 - 41 U/L 23   ALT 0 - 44 U/L 15       Latest Ref Rng & Units 04/03/2022    8:18 AM  CBC  WBC 4.0 - 10.5 K/uL 9.3   Hemoglobin 12.0 - 15.0 g/dL 10.4   Hematocrit 36.0 - 46.0 % 33.0   Platelets 150 - 400 K/uL 453    Lab Results  Component Value Date   TSH 6.258 (H) 03/13/2022     Assessment and plan- Patient is a 76 y.o. 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  She is here for on treatment assessment prior to cycle 5 of carboplatin-paclitaxel-pembrolizumab. She has completed 4 cycles of neoadjuvant AC Keytruda chemotherapy.   Labs reviewed and acceptable for continuation of treatment.  She will proceed with cycle 5 of cycle 5 of carboplatin-paclitaxel-pembrolizumab.  She will return to clinic in 1 week for consideration of day 8 cycle 5 of carbo-Taxol chemotherapy.  She will receive Keytruda every 3 weeks.  We will monitor her counts closely but Ellen Henri can be held for now.  We again reviewed possible side effects  including but not limited  to nausea, vomiting, low blood counts, risk of infections, hospitalization.  Risk of peripheral neuropathy and infusion reactions.   Anemia- hmg 10.4. Likely related to chemotherapy. Monitor.   Thrombocytosis- plt 453. Likely reactive. Monitor.   Elevated TSH-previously TSH of 6 with normal free T4.  Hold on levothyroxine at this time.  If TSH is rising or greater than 10, consider starting 50 mcg of levothyroxine.  Depression-slightly worse.  Will start duloxetine 30 mg/day x 2 weeks then increase to 60 mg/day. Reviewed risk of SI as AE. She can continue Wellbutrin which may also have an energizing effect.   Risk of chemotherapy-induced nausea and vomiting-reviewed use of ondansetron and prochlorperazine.  Risk of CIPN- reviewe duse of cold packs.   Goals of care-treatment is given with curative intent.   Visit Diagnosis 1. Encounter for antineoplastic chemotherapy   2. Malignant neoplasm of upper-outer quadrant of left breast in female, estrogen receptor negative ([place])   3. Encounter for antineoplastic immunotherapy    Beckey Rutter, [place], AGNP-C [place] [facility] [phone] (clinic) 04/03/2022

## 2022-04-04 LAB — T4: T4, Total: 8.4 ug/dL (ref 4.5–12.0)

## 2022-04-09 ENCOUNTER — Encounter (INDEPENDENT_AMBULATORY_CARE_PROVIDER_SITE_OTHER): Payer: Self-pay

## 2022-04-09 MED FILL — Dexamethasone Sodium Phosphate Inj 100 MG/10ML: INTRAMUSCULAR | Qty: 1 | Status: AC

## 2022-04-10 ENCOUNTER — Inpatient Hospital Stay: Payer: PPO

## 2022-04-10 ENCOUNTER — Other Ambulatory Visit: Payer: Self-pay

## 2022-04-10 ENCOUNTER — Encounter: Payer: Self-pay | Admitting: Oncology

## 2022-04-10 ENCOUNTER — Inpatient Hospital Stay (HOSPITAL_BASED_OUTPATIENT_CLINIC_OR_DEPARTMENT_OTHER): Payer: PPO | Admitting: Oncology

## 2022-04-10 VITALS — Temp 96.1°F

## 2022-04-10 VITALS — BP 112/56 | HR 50 | Resp 16 | Wt 94.8 lb

## 2022-04-10 DIAGNOSIS — Z5112 Encounter for antineoplastic immunotherapy: Secondary | ICD-10-CM | POA: Diagnosis not present

## 2022-04-10 DIAGNOSIS — C50412 Malignant neoplasm of upper-outer quadrant of left female breast: Secondary | ICD-10-CM

## 2022-04-10 DIAGNOSIS — Z2989 Encounter for other specified prophylactic measures: Secondary | ICD-10-CM

## 2022-04-10 DIAGNOSIS — Z5111 Encounter for antineoplastic chemotherapy: Secondary | ICD-10-CM

## 2022-04-10 DIAGNOSIS — Z171 Estrogen receptor negative status [ER-]: Secondary | ICD-10-CM

## 2022-04-10 LAB — COMPREHENSIVE METABOLIC PANEL
ALT: 15 U/L (ref 0–44)
AST: 25 U/L (ref 15–41)
Albumin: 3.3 g/dL — ABNORMAL LOW (ref 3.5–5.0)
Alkaline Phosphatase: 58 U/L (ref 38–126)
Anion gap: 5 (ref 5–15)
BUN: 19 mg/dL (ref 8–23)
CO2: 26 mmol/L (ref 22–32)
Calcium: 9 mg/dL (ref 8.9–10.3)
Chloride: 108 mmol/L (ref 98–111)
Creatinine, Ser: 0.73 mg/dL (ref 0.44–1.00)
GFR, Estimated: >60 mL/min (ref >60)
Glucose, Bld: 125 mg/dL — ABNORMAL HIGH (ref 70–99)
Potassium: 4 mmol/L (ref 3.5–5.1)
Sodium: 139 mmol/L (ref 135–145)
Total Bilirubin: 0.6 mg/dL (ref 0.3–1.2)
Total Protein: 6.3 g/dL — ABNORMAL LOW (ref 6.5–8.1)

## 2022-04-10 LAB — CBC WITH DIFFERENTIAL/PLATELET
Abs Immature Granulocytes: 0.12 10*3/uL — ABNORMAL HIGH (ref 0.00–0.07)
Basophils Absolute: 0.1 10*3/uL (ref 0.0–0.1)
Basophils Relative: 1 %
Eosinophils Absolute: 0.1 10*3/uL (ref 0.0–0.5)
Eosinophils Relative: 2 %
HCT: 33.5 % — ABNORMAL LOW (ref 36.0–46.0)
Hemoglobin: 10.6 g/dL — ABNORMAL LOW (ref 12.0–15.0)
Immature Granulocytes: 2 %
Lymphocytes Relative: 6 %
Lymphs Abs: 0.5 10*3/uL — ABNORMAL LOW (ref 0.7–4.0)
MCH: 30.7 pg (ref 26.0–34.0)
MCHC: 31.6 g/dL (ref 30.0–36.0)
MCV: 97.1 fL (ref 80.0–100.0)
Monocytes Absolute: 0.5 10*3/uL (ref 0.1–1.0)
Monocytes Relative: 7 %
Neutro Abs: 6.1 10*3/uL (ref 1.7–7.7)
Neutrophils Relative %: 82 %
Platelets: 368 10*3/uL (ref 150–400)
RBC: 3.45 MIL/uL — ABNORMAL LOW (ref 3.87–5.11)
RDW: 15.3 % (ref 11.5–15.5)
WBC: 7.4 10*3/uL (ref 4.0–10.5)
nRBC: 0 % (ref 0.0–0.2)

## 2022-04-10 LAB — TSH: TSH: 4.154 u[IU]/mL (ref 0.350–4.500)

## 2022-04-10 LAB — T4, FREE: Free T4: 1.18 ng/dL — ABNORMAL HIGH (ref 0.61–1.12)

## 2022-04-10 MED ORDER — SODIUM CHLORIDE 0.9 % IV SOLN
10.0000 mg | Freq: Once | INTRAVENOUS | Status: AC
  Administered 2022-04-10: 10 mg via INTRAVENOUS
  Filled 2022-04-10: qty 10

## 2022-04-10 MED ORDER — ONDANSETRON HCL 8 MG PO TABS: 8.0000 mg | ORAL_TABLET | Freq: Three times a day (TID) | ORAL | 2 refills | Status: DC | PRN

## 2022-04-10 MED ORDER — SODIUM CHLORIDE 0.9 % IV SOLN
88.6500 mg | Freq: Once | INTRAVENOUS | Status: AC
  Administered 2022-04-10: 90 mg via INTRAVENOUS
  Filled 2022-04-10: qty 9

## 2022-04-10 MED ORDER — SODIUM CHLORIDE 0.9 % IV SOLN
65.0000 mg/m2 | Freq: Once | INTRAVENOUS | Status: AC
  Administered 2022-04-10: 90 mg via INTRAVENOUS
  Filled 2022-04-10: qty 15

## 2022-04-10 MED ORDER — HEPARIN SOD (PORK) LOCK FLUSH 100 UNIT/ML IV SOLN
INTRAVENOUS | Status: AC
  Administered 2022-04-10: 500 [IU]
  Filled 2022-04-10: qty 5

## 2022-04-10 MED ORDER — FAMOTIDINE IN NACL 20-0.9 MG/50ML-% IV SOLN
20.0000 mg | Freq: Once | INTRAVENOUS | Status: AC
  Administered 2022-04-10: 20 mg via INTRAVENOUS
  Filled 2022-04-10: qty 50

## 2022-04-10 MED ORDER — SODIUM CHLORIDE 0.9 % IV SOLN
Freq: Once | INTRAVENOUS | Status: AC
  Filled 2022-04-10: qty 250

## 2022-04-10 MED ORDER — DEXAMETHASONE 4 MG PO TABS: 8.0000 mg | ORAL_TABLET | Freq: Every day | ORAL | 1 refills | Status: DC

## 2022-04-10 MED ORDER — PALONOSETRON HCL INJECTION 0.25 MG/5ML
0.2500 mg | Freq: Once | INTRAVENOUS | Status: AC
  Administered 2022-04-10: 0.25 mg via INTRAVENOUS
  Filled 2022-04-10: qty 5

## 2022-04-10 MED ORDER — DIPHENHYDRAMINE HCL 50 MG/ML IJ SOLN
50.0000 mg | Freq: Once | INTRAMUSCULAR | Status: AC
  Administered 2022-04-10: 50 mg via INTRAVENOUS
  Filled 2022-04-10: qty 1

## 2022-04-10 MED ORDER — HEPARIN SOD (PORK) LOCK FLUSH 100 UNIT/ML IV SOLN
500.0000 [IU] | Freq: Once | INTRAVENOUS | Status: AC | PRN
  Filled 2022-04-10: qty 5

## 2022-04-10 MED ORDER — SODIUM CHLORIDE 0.9 % IV SOLN: 105.7500 mg | Freq: Once | INTRAVENOUS | Status: DC

## 2022-04-10 MED ORDER — PROCHLORPERAZINE MALEATE 10 MG PO TABS: 10.0000 mg | ORAL_TABLET | Freq: Four times a day (QID) | ORAL | 1 refills | Status: DC | PRN

## 2022-04-10 MED ORDER — LORAZEPAM 0.5 MG PO TABS: 0.5000 mg | ORAL_TABLET | Freq: Four times a day (QID) | ORAL | 0 refills | Status: DC | PRN

## 2022-04-10 NOTE — Progress Notes (Signed)
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  Showed a irregular mass measuring 2.3 x 3.2 x 3.6 cm.  There were 2 other smaller masses in the left breast measuring 4 x 7 x 9 mm and 3 x 5 x 5 mm.  7 abnormal lymph nodes in the left axilla.  Indeterminate solid and cystic mass in the right breast in the retroareolar region measuring 1 x 1.2 x 1.9 cm as well as another 4.5 cm group of indeterminate microcalcifications in the lower right breast.  Patient had 2 left breast biopsy along with left lymph node biopsy.  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   PET CT scan showed hypermetabolic left breast mass and clusters of small but hypermetabolic left axillary and subpectoral lymph nodes.  1.2 x 0.9 cm subpleural nodule with an SUV of 1.6.   Patient is being treated as per keynote 522 regimen starting on 01/09/2022  Interval history-patient is here with her niece today.   Patient lives alone and also cares for her daughter who has special needs.  Patient's niece is concerned about overall cognitive decline in the patient.  She does experience episodes of sundowning almost every evening.  She still continues to remain independent and is able to drive to grocery store and back.  ECOG PS- 1 Pain scale- 0   Review of systems- Review of Systems  Constitutional:  Positive for malaise/fatigue. Negative for chills, fever and weight loss.  HENT:  Negative for congestion, ear discharge and nosebleeds.   Eyes:  Negative for blurred vision.  Respiratory:  Negative for cough, hemoptysis, sputum production, shortness of breath and wheezing.   Cardiovascular:  Negative for chest pain, palpitations, orthopnea and claudication.  Gastrointestinal:  Negative for abdominal pain, blood in stool, constipation, diarrhea, heartburn, melena, nausea and vomiting.  Genitourinary:  Negative for dysuria, flank pain, frequency, hematuria and urgency.  Musculoskeletal:  Negative for back pain, joint pain and myalgias.  Skin:  Negative for rash.  Neurological:  Negative for dizziness, tingling, focal weakness, seizures, weakness and headaches.  Endo/Heme/Allergies:  Does not bruise/bleed easily.  Psychiatric/Behavioral:  Negative for depression and suicidal ideas. The patient does not have insomnia.       No Known Allergies   Past Medical History:  Diagnosis Date   Anemia    Anxiety    Arthritis    NECK   Cancer ([place])    BASAL CELL AND MELANOMA   DAVF (dural arteriovenous fistula) 2015   Dementia ([facility])    Family history  of adverse reaction to anesthesia    niece has to come out of anesthesia slow   Hypertension    Pneumonia      Past Surgical History:  Procedure Laterality Date   BRAIN SURGERY  2015   DURAL AV FISTULA ([place] [facility])   BREAST BIOPSY Left 12/14/2021   [place] Bx 3:00 7 CMFN, Coil Clip, Path Pending   BREAST BIOPSY Left 12/14/2021   [place] Bx 10:00 2 CMFN, Venus  Clip, path pending   breast biopsy Left 12/14/2021   [place] Axille, Hydromarker (butterfly). path pending   BREAST BIOPSY Right 12/19/2021   Stereo bx-calcs, "COIL" clip-path pending   BREAST BIOPSY Right 12/19/2021   u/s bx-mass, 3:00, retroareolar, "HEART" clip-path pending   COLONOSCOPY     COLONOSCOPY WITH PROPOFOL N/A 02/20/2017   Procedure: COLONOSCOPY WITH PROPOFOL;  Surgeon: Manya Silvas, MD;  Location: [facility] ENDOSCOPY;  Service: Endoscopy;  Laterality: N/A;   HYSTEROSCOPY WITH D & C N/A 07/29/2015   Procedure: DILATATION AND CURETTAGE /HYSTEROSCOPY;  Surgeon: Benjaman Kindler, MD;  Location: [facility] ORS;  Service: Gynecology;  Laterality: N/A;   HYSTEROSCOPY WITH D & C N/A 01/24/2018   Procedure: DILATATION AND CURETTAGE /HYSTEROSCOPY;  Surgeon: Benjaman Kindler, MD;  Location: [facility] ORS;  Service: Gynecology;  Laterality: N/A;   LAPAROSCOPY N/A 01/24/2018   Procedure: LAPAROSCOPY DIAGNOSTIC;  Surgeon: Benjaman Kindler, MD;  Location: [facility] ORS;  Service: Gynecology;  Laterality: N/A;   PERIPHERAL VASCULAR THROMBECTOMY N/A 02/29/2020   Procedure: PERIPHERAL VASCULAR THROMBECTOMY / THROMBOLYSIS WITH POSSIBLE PULMONARY THROMBECTOMY / THROMBOLYSIS;  Surgeon: Algernon Huxley, MD;  Location: [place] [facility] LAB;  Service: Cardiovascular;  Laterality: N/A;   PORTACATH PLACEMENT N/A 01/08/2022   Procedure: INSERTION PORT-A-CATH;  Surgeon: Herbert Pun, MD;  Location: [facility] ORS;  Service: General;  Laterality: N/A;   TONSILLECTOMY     AGE 80   TOTAL LAPAROSCOPIC HYSTERECTOMY WITH BILATERAL SALPINGO OOPHORECTOMY Bilateral 12/28/2019   Procedure: TOTAL LAPAROSCOPIC HYSTERECTOMY WITH BILATERAL SALPINGO OOPHORECTOMY;  Surgeon: Benjaman Kindler, MD;  Location: [facility] ORS;  Service: Gynecology;  Laterality: Bilateral;   TUBAL LIGATION      Social History   Socioeconomic History   Marital status: Divorced    Spouse name: Not on file   Number of children: Not on file   Years of education: Not  on file   Highest education level: Not on file  Occupational History   Not on file  Tobacco Use   Smoking status: Former    Packs/day: 0.50    Years: 15.00    Total pack years: 7.50    Types: Cigarettes    Quit date: 07/21/2000    Years since quitting: 21.7   Smokeless tobacco: Never  Vaping Use   Vaping Use: Never used  Substance and Sexual Activity   Alcohol use: Not Currently   Drug use: No   Sexual activity: Not Currently  Other Topics Concern   Not on file  Social History Narrative   Lives alone, caregiver 18 year old daughter   Social Determinants of Health   Financial Resource Strain: Not on file  Food Insecurity: Not on file  Transportation Needs: Not on file  Physical Activity: Not on file  Stress: Not on file  Social Connections: Not on file  Intimate Partner Violence: Not on file    Family History  Problem Relation Age of Onset   Stomach cancer Sister    Hypertension Sister    Hypertension Sister    Diabetes Niece  Hypertension Niece    Breast cancer Neg Hx      Current Outpatient Medications:    benzonatate (TESSALON) 200 MG capsule, Take 1 capsule (200 mg total) by mouth 3 (three) times daily as needed for cough., Disp: 60 capsule, Rfl: 0   buPROPion (WELLBUTRIN XL) 150 MG 24 hr tablet, Take 150 mg by mouth every morning., Disp: , Rfl: 6   cetirizine (ZYRTEC) 10 MG tablet, Take 10 mg by mouth daily as needed for allergies. (Patient not taking: Reported on 04/03/2022), Disp: , Rfl:    dexamethasone (DECADRON) 4 MG tablet, Take 2 tablets (8 mg total) by mouth daily. Start the day after chemotherapy for 2 days., Disp: 30 tablet, Rfl: 1   DULoxetine (CYMBALTA) 30 MG capsule, Take 1 capsule (30 mg total) by mouth daily for 14 days, THEN 2 capsules (60 mg total) daily for 14 days., Disp: 42 capsule, Rfl: 0   lidocaine-prilocaine (EMLA) cream, Apply 1 Application topically as needed., Disp: , Rfl:    LORazepam (ATIVAN) 0.5 MG tablet, Take 1 tablet (0.5 mg  total) by mouth every 6 (six) hours as needed (Nausea or vomiting)., Disp: 30 tablet, Rfl: 0   ondansetron (ZOFRAN) 8 MG tablet, Take 1 tablet (8 mg total) by mouth every 8 (eight) hours as needed for nausea or vomiting., Disp: 30 tablet, Rfl: 2   polyethylene glycol (MIRALAX / GLYCOLAX) 17 g packet, Take 17 g by mouth daily. (Patient not taking: Reported on 04/03/2022), Disp: , Rfl:    prochlorperazine (COMPAZINE) 10 MG tablet, Take 1 tablet (10 mg total) by mouth every 6 (six) hours as needed (Nausea or vomiting)., Disp: 30 tablet, Rfl: 1 No current facility-administered medications for this visit.  Facility-Administered Medications Ordered in Other Visits:    CARBOplatin (PARAPLATIN) 90 mg in sodium chloride 0.9 % 100 mL chemo infusion, 90 mg, Intravenous, Once, Sindy Guadeloupe, MD, Last Rate: 218 mL/hr at 04/10/22 1249, 90 mg at 04/10/22 1249   heparin lock flush 100 UNIT/ML injection, , , ,    heparin lock flush 100 unit/mL, 500 Units, Intracatheter, Once PRN, Sindy Guadeloupe, MD  Physical exam:  Vitals:   04/10/22 0914  BP: (!) 112/56  Pulse: (!) 50  Resp: 16  SpO2: 100%  Weight: 94 lb 12.8 oz (43 kg)   Physical Exam Constitutional:      General: She is not in acute distress. Cardiovascular:     Rate and Rhythm: Normal rate and regular rhythm.     Heart sounds: Normal heart sounds.  Pulmonary:     Effort: Pulmonary effort is normal.     Breath sounds: Normal breath sounds.  Skin:    General: Skin is warm and dry.     Comments: Marks of excoriation seen over bilateral neck  Neurological:     Mental Status: She is alert and oriented to person, place, and time.         Latest Ref Rng & Units 04/10/2022    8:55 AM  CMP  Glucose 70 - 99 mg/dL 125   BUN 8 - 23 mg/dL 19   Creatinine 0.44 - 1.00 mg/dL 0.73   Sodium 135 - 145 mmol/L 139   Potassium 3.5 - 5.1 mmol/L 4.0   Chloride 98 - 111 mmol/L 108   CO2 22 - 32 mmol/L 26   Calcium 8.9 - 10.3 mg/dL 9.0   Total Protein 6.5  - 8.1 g/dL 6.3   Total Bilirubin 0.3 - 1.2 mg/dL 0.6  Alkaline Phos 38 - 126 U/L 58   AST 15 - 41 U/L 25   ALT 0 - 44 U/L 15       Latest Ref Rng & Units 04/10/2022    8:55 AM  CBC  WBC 4.0 - 10.5 K/uL 7.4   Hemoglobin 12.0 - 15.0 g/dL 10.6   Hematocrit 36.0 - 46.0 % 33.5   Platelets 150 - 400 K/uL 368     No images are attached to the encounter.  US BREAST LTD UNI LEFT INC AXILLA  Result Date: 03/28/2022 CLINICAL DATA:  Patient is status post 3 site LEFT breast ultrasound-guided biopsy. There is biopsy-proven cancer 3 o'clock 7 cm from the nipple (COIL clip), metastatic nodal disease the LEFT axilla (HYDROMARK clip). A third site at 10 o'clock demonstrated a papilloma. Contralateral breast demonstrated ADH in association with a complex fibroepithelial proliferation. Patient presents for neoadjuvant chemotherapy response evaluation. EXAM: ULTRASOUND OF THE LEFT BREAST COMPARISON:  Previous exam(s). FINDINGS: On physical exam, there is a hard mass in the LEFT outer breast. Targeted ultrasound was performed of the LEFT breast. At 3 o'clock 7 cm from the nipple, there is an irregular hypoechoic mass with irregular margins and multiple internal echogenic foci consistent with calcifications. It is estimated to measure 2.8 x 1.7 x 2.9 cm, previously 3.2 x 2.3 x 3.6 cm. It is mildly less expansile in appearance in comparison to prior. This is consistent with biopsy-proven malignancy. Targeted ultrasound was performed of the LEFT axilla. There are multiple LEFT axillary lymph nodes noted in the axilla and low LEFT axilla. They do not demonstrate normal echogenic hila. HYDROMARK clip was located adjacent to biopsied lymph node. This lymph node spans approximately 5 mm, previously 8 mm. Multiple additional lymph nodes along the low LEFT axilla measure approximately 4-5 mm but do not demonstrate definitive echogenic hila. They previously measured approximately 8 mm in size. This is consistent with some  degree of treatment response. Retropectoral lymph nodes described on prior MRI are not visualized on ultrasound. IMPRESSION: 1. Mild decrease in size and expansile appearance of biopsy-proven LEFT breast malignancy consistent with some degree of treatment response. Residual mass spans approximately 2.9 cm, previously 3.6 cm. 2. Global decrease in size of LEFT axillary lymph nodes, consistent with some degree of treatment response. However residual lymph nodes remain morphologically abnormal. RECOMMENDATION: Recommend continued clinical and surgical management of biopsy proven LEFT breast malignancy and nodal disease. I have discussed the findings and recommendations with the patient. If applicable, a reminder letter will be sent to the patient regarding the next appointment. BI-RADS CATEGORY  6: Known biopsy-proven malignancy. Electronically Signed   By: Valentino Saxon M.D.   On: 03/28/2022 16:34    Assessment and plan- Patient is a 76 y.o. 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  She is s/p neoadjuvant [facility] Keytruda chemotherapy and here for on treatment assessment prior to cycle 1 day 8 of weekly CarboTaxol chemotherapy  Counts okay to proceed with cycle 1 day 8 of weekly CarboTaxol chemotherapy today.  She will directly proceed for cycle 1 day 15 of treatment next week and I will see her back in 2 weeks for cycle 2-day 1 of CarboTaxol Keytruda chemotherapy.  Plan is to complete total 12 cycles of weekly CarboTaxol chemotherapy followed by definitive surgery.  Cognitive decline/possible dementia: Patient follows up with Dr. Sabra Heck for this.  Neurology  referral may be considered down the line as well.   Visit Diagnosis 1. Malignant neoplasm of upper-outer quadrant of left breast in female, estrogen receptor negative ([place])   2. Encounter for antineoplastic chemotherapy   3. Encounter for  antineoplastic immunotherapy      Dr. Randa Evens, MD, MPH [facility] [phone] 04/10/2022 1:01 PM

## 2022-04-10 NOTE — Patient Instructions (Signed)
[facility] [place]  Discharge Instructions: Thank you for choosing [facility] to provide your oncology and hematology care.  If you have a lab appointment with the Cancer Center, please go directly to the Cancer Center and check in at the registration area.  Wear comfortable clothing and clothing appropriate for easy access to any Portacath or PICC line.   We strive to give you quality time with your provider. You may need to reschedule your appointment if you arrive late (15 or more minutes).  Arriving late affects you and other patients whose appointments are after yours.  Also, if you miss three or more appointments without notifying the office, you may be dismissed from the clinic at the provider's discretion.      For prescription refill requests, have your pharmacy contact our office and allow 72 hours for refills to be completed.    Today you received the following chemotherapy and/or immunotherapy agents Taxol and Carboplatin       To help prevent nausea and vomiting after your treatment, we encourage you to take your nausea medication as directed.  BELOW ARE SYMPTOMS THAT SHOULD BE REPORTED IMMEDIATELY: *FEVER GREATER THAN 100.4 F (38 C) OR HIGHER *CHILLS OR SWEATING *NAUSEA AND VOMITING THAT IS NOT CONTROLLED WITH YOUR NAUSEA MEDICATION *UNUSUAL SHORTNESS OF BREATH *UNUSUAL BRUISING OR BLEEDING *URINARY PROBLEMS (pain or burning when urinating, or frequent urination) *BOWEL PROBLEMS (unusual diarrhea, constipation, pain near the anus) TENDERNESS IN MOUTH AND THROAT WITH OR WITHOUT PRESENCE OF ULCERS (sore throat, sores in mouth, or a toothache) UNUSUAL RASH, SWELLING OR PAIN  UNUSUAL VAGINAL DISCHARGE OR ITCHING   Items with * indicate a potential emergency and should be followed up as soon as possible or go to the Emergency Department if any problems should occur.  Please show the CHEMOTHERAPY ALERT CARD or IMMUNOTHERAPY ALERT CARD at  check-in to the Emergency Department and triage nurse.  Should you have questions after your visit or need to cancel or reschedule your appointment, please contact [facility] [place]  [phone] and follow the prompts.  Office hours are 8:00 a.m. to 4:30 p.m. Monday - Friday. Please note that voicemails left after 4:00 p.m. may not be returned until the following business day.  We are closed weekends and major holidays. You have access to a nurse at all times for urgent questions. Please call the main number to the clinic [phone] and follow the prompts.  For any non-urgent questions, you may also contact your provider using MyChart. We now offer e-Visits for anyone 18 and older to request care online for non-urgent symptoms. For details visit [email or web address].   Also download the MyChart app! Go to the app store, search "MyChart", open the app, select [facility], and log in with your MyChart username and password.  Masks are optional in the cancer centers. If you would like for your care team to wear a mask while they are taking care of you, please let them know. For doctor visits, patients may have with them one support person who is at least 78 years old. At this time, visitors are not allowed in the infusion area.  

## 2022-04-11 ENCOUNTER — Other Ambulatory Visit: Payer: Self-pay

## 2022-04-14 ENCOUNTER — Other Ambulatory Visit: Payer: Self-pay

## 2022-04-17 ENCOUNTER — Other Ambulatory Visit: Payer: Self-pay | Admitting: Oncology

## 2022-04-17 ENCOUNTER — Other Ambulatory Visit: Payer: Self-pay

## 2022-04-17 ENCOUNTER — Inpatient Hospital Stay: Payer: PPO

## 2022-04-17 ENCOUNTER — Inpatient Hospital Stay: Payer: PPO | Attending: Oncology

## 2022-04-17 VITALS — BP 122/61 | HR 78 | Temp 98.7°F | Resp 16 | Wt 94.9 lb

## 2022-04-17 DIAGNOSIS — C50412 Malignant neoplasm of upper-outer quadrant of left female breast: Secondary | ICD-10-CM | POA: Insufficient documentation

## 2022-04-17 DIAGNOSIS — Z171 Estrogen receptor negative status [ER-]: Secondary | ICD-10-CM

## 2022-04-17 DIAGNOSIS — Z5111 Encounter for antineoplastic chemotherapy: Secondary | ICD-10-CM | POA: Diagnosis not present

## 2022-04-17 DIAGNOSIS — D701 Agranulocytosis secondary to cancer chemotherapy: Secondary | ICD-10-CM | POA: Diagnosis not present

## 2022-04-17 DIAGNOSIS — T451X5A Adverse effect of antineoplastic and immunosuppressive drugs, initial encounter: Secondary | ICD-10-CM | POA: Insufficient documentation

## 2022-04-17 DIAGNOSIS — Z79899 Other long term (current) drug therapy: Secondary | ICD-10-CM | POA: Diagnosis not present

## 2022-04-17 DIAGNOSIS — Z5112 Encounter for antineoplastic immunotherapy: Secondary | ICD-10-CM | POA: Insufficient documentation

## 2022-04-17 LAB — COMPREHENSIVE METABOLIC PANEL
ALT: 13 U/L (ref 0–44)
AST: 24 U/L (ref 15–41)
Albumin: 3.4 g/dL — ABNORMAL LOW (ref 3.5–5.0)
Alkaline Phosphatase: 53 U/L (ref 38–126)
Anion gap: 7 (ref 5–15)
BUN: 11 mg/dL (ref 8–23)
CO2: 24 mmol/L (ref 22–32)
Calcium: 8.9 mg/dL (ref 8.9–10.3)
Chloride: 109 mmol/L (ref 98–111)
Creatinine, Ser: 0.84 mg/dL (ref 0.44–1.00)
GFR, Estimated: >60 mL/min (ref >60)
Glucose, Bld: 100 mg/dL — ABNORMAL HIGH (ref 70–99)
Potassium: 3.9 mmol/L (ref 3.5–5.1)
Sodium: 140 mmol/L (ref 135–145)
Total Bilirubin: 0.7 mg/dL (ref 0.3–1.2)
Total Protein: 6.2 g/dL — ABNORMAL LOW (ref 6.5–8.1)

## 2022-04-17 LAB — CBC WITH DIFFERENTIAL/PLATELET
Abs Immature Granulocytes: 0.02 10*3/uL (ref 0.00–0.07)
Basophils Absolute: 0 10*3/uL (ref 0.0–0.1)
Basophils Relative: 1 %
Eosinophils Absolute: 0.1 10*3/uL (ref 0.0–0.5)
Eosinophils Relative: 3 %
HCT: 31.4 % — ABNORMAL LOW (ref 36.0–46.0)
Hemoglobin: 9.9 g/dL — ABNORMAL LOW (ref 12.0–15.0)
Immature Granulocytes: 1 %
Lymphocytes Relative: 11 %
Lymphs Abs: 0.4 10*3/uL — ABNORMAL LOW (ref 0.7–4.0)
MCH: 31.1 pg (ref 26.0–34.0)
MCHC: 31.5 g/dL (ref 30.0–36.0)
MCV: 98.7 fL (ref 80.0–100.0)
Monocytes Absolute: 0.3 10*3/uL (ref 0.1–1.0)
Monocytes Relative: 10 %
Neutro Abs: 2.6 10*3/uL (ref 1.7–7.7)
Neutrophils Relative %: 74 %
Platelets: 232 10*3/uL (ref 150–400)
RBC: 3.18 MIL/uL — ABNORMAL LOW (ref 3.87–5.11)
RDW: 16.6 % — ABNORMAL HIGH (ref 11.5–15.5)
WBC: 3.5 10*3/uL — ABNORMAL LOW (ref 4.0–10.5)
nRBC: 0 % (ref 0.0–0.2)

## 2022-04-17 MED ORDER — PALONOSETRON HCL INJECTION 0.25 MG/5ML
0.2500 mg | Freq: Once | INTRAVENOUS | Status: AC
  Administered 2022-04-17: 0.25 mg via INTRAVENOUS
  Filled 2022-04-17: qty 5

## 2022-04-17 MED ORDER — SODIUM CHLORIDE 0.9 % IV SOLN
88.6500 mg | Freq: Once | INTRAVENOUS | Status: AC
  Administered 2022-04-17: 90 mg via INTRAVENOUS
  Filled 2022-04-17: qty 9

## 2022-04-17 MED ORDER — HEPARIN SOD (PORK) LOCK FLUSH 100 UNIT/ML IV SOLN
500.0000 [IU] | Freq: Once | INTRAVENOUS | Status: AC | PRN
  Administered 2022-04-17: 500 [IU]
  Filled 2022-04-17: qty 5

## 2022-04-17 MED ORDER — HEPARIN SOD (PORK) LOCK FLUSH 100 UNIT/ML IV SOLN
INTRAVENOUS | Status: AC
  Filled 2022-04-17: qty 5

## 2022-04-17 MED ORDER — SODIUM CHLORIDE 0.9 % IV SOLN
10.0000 mg | Freq: Once | INTRAVENOUS | Status: AC
  Administered 2022-04-17: 10 mg via INTRAVENOUS
  Filled 2022-04-17: qty 10

## 2022-04-17 MED ORDER — SODIUM CHLORIDE 0.9 % IV SOLN
65.0000 mg/m2 | Freq: Once | INTRAVENOUS | Status: AC
  Administered 2022-04-17: 90 mg via INTRAVENOUS
  Filled 2022-04-17: qty 15

## 2022-04-17 MED ORDER — SODIUM CHLORIDE 0.9 % IV SOLN
Freq: Once | INTRAVENOUS | Status: AC
  Filled 2022-04-17: qty 250

## 2022-04-17 MED ORDER — FAMOTIDINE IN NACL 20-0.9 MG/50ML-% IV SOLN
20.0000 mg | Freq: Once | INTRAVENOUS | Status: AC
  Administered 2022-04-17: 20 mg via INTRAVENOUS
  Filled 2022-04-17: qty 50

## 2022-04-17 MED ORDER — SODIUM CHLORIDE 0.9% FLUSH
10.0000 mL | INTRAVENOUS | Status: DC | PRN
  Administered 2022-04-17: 10 mL
  Filled 2022-04-17: qty 10

## 2022-04-17 MED ORDER — DIPHENHYDRAMINE HCL 50 MG/ML IJ SOLN
50.0000 mg | Freq: Once | INTRAMUSCULAR | Status: AC
  Administered 2022-04-17: 50 mg via INTRAVENOUS
  Filled 2022-04-17: qty 1

## 2022-04-17 NOTE — Patient Instructions (Signed)

## 2022-04-17 NOTE — Patient Instructions (Signed)
[facility] [place]  Discharge Instructions: Thank you for choosing [facility] to provide your oncology and hematology care.  If you have a lab appointment with the Cancer Center, please go directly to the Cancer Center and check in at the registration area.  Wear comfortable clothing and clothing appropriate for easy access to any Portacath or PICC line.   We strive to give you quality time with your provider. You may need to reschedule your appointment if you arrive late (15 or more minutes).  Arriving late affects you and other patients whose appointments are after yours.  Also, if you miss three or more appointments without notifying the office, you may be dismissed from the clinic at the provider's discretion.      For prescription refill requests, have your pharmacy contact our office and allow 72 hours for refills to be completed.    Today you received the following chemotherapy and/or immunotherapy agents Taxol and Carboplatin       To help prevent nausea and vomiting after your treatment, we encourage you to take your nausea medication as directed.  BELOW ARE SYMPTOMS THAT SHOULD BE REPORTED IMMEDIATELY: *FEVER GREATER THAN 100.4 F (38 C) OR HIGHER *CHILLS OR SWEATING *NAUSEA AND VOMITING THAT IS NOT CONTROLLED WITH YOUR NAUSEA MEDICATION *UNUSUAL SHORTNESS OF BREATH *UNUSUAL BRUISING OR BLEEDING *URINARY PROBLEMS (pain or burning when urinating, or frequent urination) *BOWEL PROBLEMS (unusual diarrhea, constipation, pain near the anus) TENDERNESS IN MOUTH AND THROAT WITH OR WITHOUT PRESENCE OF ULCERS (sore throat, sores in mouth, or a toothache) UNUSUAL RASH, SWELLING OR PAIN  UNUSUAL VAGINAL DISCHARGE OR ITCHING   Items with * indicate a potential emergency and should be followed up as soon as possible or go to the Emergency Department if any problems should occur.  Please show the CHEMOTHERAPY ALERT CARD or IMMUNOTHERAPY ALERT CARD at  check-in to the Emergency Department and triage nurse.  Should you have questions after your visit or need to cancel or reschedule your appointment, please contact [facility] [place]  [phone] and follow the prompts.  Office hours are 8:00 a.m. to 4:30 p.m. Monday - Friday. Please note that voicemails left after 4:00 p.m. may not be returned until the following business day.  We are closed weekends and major holidays. You have access to a nurse at all times for urgent questions. Please call the main number to the clinic [phone] and follow the prompts.  For any non-urgent questions, you may also contact your provider using MyChart. We now offer e-Visits for anyone 18 and older to request care online for non-urgent symptoms. For details visit [email or web address].   Also download the MyChart app! Go to the app store, search "MyChart", open the app, select , and log in with your MyChart username and password.  Masks are optional in the cancer centers. If you would like for your care team to wear a mask while they are taking care of you, please let them know. For doctor visits, patients may have with them one support person who is at least 78 years old. At this time, visitors are not allowed in the infusion area.  

## 2022-04-23 MED FILL — Dexamethasone Sodium Phosphate Inj 100 MG/10ML: INTRAMUSCULAR | Qty: 1 | Status: AC

## 2022-04-24 ENCOUNTER — Inpatient Hospital Stay (HOSPITAL_BASED_OUTPATIENT_CLINIC_OR_DEPARTMENT_OTHER): Payer: PPO | Admitting: Oncology

## 2022-04-24 ENCOUNTER — Inpatient Hospital Stay: Payer: PPO

## 2022-04-24 ENCOUNTER — Encounter: Payer: Self-pay | Admitting: Oncology

## 2022-04-24 VITALS — BP 126/86 | HR 84 | Temp 97.7°F | Resp 16 | Wt 95.5 lb

## 2022-04-24 DIAGNOSIS — C50412 Malignant neoplasm of upper-outer quadrant of left female breast: Secondary | ICD-10-CM

## 2022-04-24 DIAGNOSIS — Z2989 Encounter for other specified prophylactic measures: Secondary | ICD-10-CM

## 2022-04-24 DIAGNOSIS — Z5112 Encounter for antineoplastic immunotherapy: Secondary | ICD-10-CM

## 2022-04-24 DIAGNOSIS — Z171 Estrogen receptor negative status [ER-]: Secondary | ICD-10-CM | POA: Diagnosis not present

## 2022-04-24 DIAGNOSIS — Z95828 Presence of other vascular implants and grafts: Secondary | ICD-10-CM

## 2022-04-24 DIAGNOSIS — Z5111 Encounter for antineoplastic chemotherapy: Secondary | ICD-10-CM | POA: Diagnosis not present

## 2022-04-24 LAB — CBC WITH DIFFERENTIAL/PLATELET
Abs Immature Granulocytes: 0.03 10*3/uL (ref 0.00–0.07)
Basophils Absolute: 0 10*3/uL (ref 0.0–0.1)
Basophils Relative: 1 %
Eosinophils Absolute: 0 10*3/uL (ref 0.0–0.5)
Eosinophils Relative: 1 %
HCT: 30 % — ABNORMAL LOW (ref 36.0–46.0)
Hemoglobin: 9.6 g/dL — ABNORMAL LOW (ref 12.0–15.0)
Immature Granulocytes: 1 %
Lymphocytes Relative: 11 %
Lymphs Abs: 0.4 10*3/uL — ABNORMAL LOW (ref 0.7–4.0)
MCH: 32 pg (ref 26.0–34.0)
MCHC: 32 g/dL (ref 30.0–36.0)
MCV: 100 fL (ref 80.0–100.0)
Monocytes Absolute: 0.3 10*3/uL (ref 0.1–1.0)
Monocytes Relative: 10 %
Neutro Abs: 2.4 10*3/uL (ref 1.7–7.7)
Neutrophils Relative %: 76 %
Platelets: 169 10*3/uL (ref 150–400)
RBC: 3 MIL/uL — ABNORMAL LOW (ref 3.87–5.11)
RDW: 17.1 % — ABNORMAL HIGH (ref 11.5–15.5)
WBC: 3.1 10*3/uL — ABNORMAL LOW (ref 4.0–10.5)
nRBC: 0 % (ref 0.0–0.2)

## 2022-04-24 LAB — COMPREHENSIVE METABOLIC PANEL
ALT: 12 U/L (ref 0–44)
AST: 21 U/L (ref 15–41)
Albumin: 3.4 g/dL — ABNORMAL LOW (ref 3.5–5.0)
Alkaline Phosphatase: 53 U/L (ref 38–126)
Anion gap: 5 (ref 5–15)
BUN: 9 mg/dL (ref 8–23)
CO2: 24 mmol/L (ref 22–32)
Calcium: 8.7 mg/dL — ABNORMAL LOW (ref 8.9–10.3)
Chloride: 108 mmol/L (ref 98–111)
Creatinine, Ser: 0.69 mg/dL (ref 0.44–1.00)
GFR, Estimated: >60 mL/min (ref >60)
Glucose, Bld: 97 mg/dL (ref 70–99)
Potassium: 3.6 mmol/L (ref 3.5–5.1)
Sodium: 137 mmol/L (ref 135–145)
Total Bilirubin: 1 mg/dL (ref 0.3–1.2)
Total Protein: 6.2 g/dL — ABNORMAL LOW (ref 6.5–8.1)

## 2022-04-24 MED ORDER — DIPHENHYDRAMINE HCL 50 MG/ML IJ SOLN
50.0000 mg | Freq: Once | INTRAMUSCULAR | Status: AC
  Administered 2022-04-24: 25 mg via INTRAVENOUS
  Filled 2022-04-24: qty 1

## 2022-04-24 MED ORDER — SODIUM CHLORIDE 0.9% FLUSH
10.0000 mL | INTRAVENOUS | Status: DC | PRN
  Administered 2022-04-24: 10 mL via INTRAVENOUS
  Filled 2022-04-24: qty 10

## 2022-04-24 MED ORDER — SODIUM CHLORIDE 0.9 % IV SOLN
200.0000 mg | Freq: Once | INTRAVENOUS | Status: AC
  Administered 2022-04-24: 200 mg via INTRAVENOUS
  Filled 2022-04-24: qty 8

## 2022-04-24 MED ORDER — SODIUM CHLORIDE 0.9 % IV SOLN
65.0000 mg/m2 | Freq: Once | INTRAVENOUS | Status: AC
  Administered 2022-04-24: 90 mg via INTRAVENOUS
  Filled 2022-04-24: qty 15

## 2022-04-24 MED ORDER — FAMOTIDINE IN NACL 20-0.9 MG/50ML-% IV SOLN
20.0000 mg | Freq: Once | INTRAVENOUS | Status: AC
  Administered 2022-04-24: 20 mg via INTRAVENOUS
  Filled 2022-04-24: qty 50

## 2022-04-24 MED ORDER — SODIUM CHLORIDE 0.9 % IV SOLN
10.0000 mg | Freq: Once | INTRAVENOUS | Status: AC
  Administered 2022-04-24: 10 mg via INTRAVENOUS
  Filled 2022-04-24: qty 10

## 2022-04-24 MED ORDER — SODIUM CHLORIDE 0.9 % IV SOLN
88.6500 mg | Freq: Once | INTRAVENOUS | Status: AC
  Administered 2022-04-24: 90 mg via INTRAVENOUS
  Filled 2022-04-24: qty 9

## 2022-04-24 MED ORDER — HEPARIN SOD (PORK) LOCK FLUSH 100 UNIT/ML IV SOLN
500.0000 [IU] | Freq: Once | INTRAVENOUS | Status: AC | PRN
  Administered 2022-04-24: 500 [IU]
  Filled 2022-04-24: qty 5

## 2022-04-24 MED ORDER — PALONOSETRON HCL INJECTION 0.25 MG/5ML
0.2500 mg | Freq: Once | INTRAVENOUS | Status: AC
  Administered 2022-04-24: 0.25 mg via INTRAVENOUS
  Filled 2022-04-24: qty 5

## 2022-04-24 MED ORDER — SODIUM CHLORIDE 0.9 % IV SOLN
Freq: Once | INTRAVENOUS | Status: AC
  Filled 2022-04-24: qty 250

## 2022-04-24 NOTE — Patient Instructions (Signed)
[facility] [place]  Discharge Instructions: Thank you for choosing [facility] to provide your oncology and hematology care.  If you have a lab appointment with the Cancer Center, please go directly to the Cancer Center and check in at the registration area.  Wear comfortable clothing and clothing appropriate for easy access to any Portacath or PICC line.   We strive to give you quality time with your provider. You may need to reschedule your appointment if you arrive late (15 or more minutes).  Arriving late affects you and other patients whose appointments are after yours.  Also, if you miss three or more appointments without notifying the office, you may be dismissed from the clinic at the provider's discretion.      For prescription refill requests, have your pharmacy contact our office and allow 72 hours for refills to be completed.    Today you received the following chemotherapy and/or immunotherapy agents Keytruda, Taxol, Paraplatin   To help prevent nausea and vomiting after your treatment, we encourage you to take your nausea medication as directed.  BELOW ARE SYMPTOMS THAT SHOULD BE REPORTED IMMEDIATELY: *FEVER GREATER THAN 100.4 F (38 C) OR HIGHER *CHILLS OR SWEATING *NAUSEA AND VOMITING THAT IS NOT CONTROLLED WITH YOUR NAUSEA MEDICATION *UNUSUAL SHORTNESS OF BREATH *UNUSUAL BRUISING OR BLEEDING *URINARY PROBLEMS (pain or burning when urinating, or frequent urination) *BOWEL PROBLEMS (unusual diarrhea, constipation, pain near the anus) TENDERNESS IN MOUTH AND THROAT WITH OR WITHOUT PRESENCE OF ULCERS (sore throat, sores in mouth, or a toothache) UNUSUAL RASH, SWELLING OR PAIN  UNUSUAL VAGINAL DISCHARGE OR ITCHING   Items with * indicate a potential emergency and should be followed up as soon as possible or go to the Emergency Department if any problems should occur.  Please show the CHEMOTHERAPY ALERT CARD or IMMUNOTHERAPY ALERT CARD  at check-in to the Emergency Department and triage nurse.  Should you have questions after your visit or need to cancel or reschedule your appointment, please contact [facility] [place]  [phone] and follow the prompts.  Office hours are 8:00 a.m. to 4:30 p.m. Monday - Friday. Please note that voicemails left after 4:00 p.m. may not be returned until the following business day.  We are closed weekends and major holidays. You have access to a nurse at all times for urgent questions. Please call the main number to the clinic [phone] and follow the prompts.  For any non-urgent questions, you may also contact your provider using MyChart. We now offer e-Visits for anyone 18 and older to request care online for non-urgent symptoms. For details visit [email or web address].   Also download the MyChart app! Go to the app store, search "MyChart", open the app, select , and log in with your MyChart username and password.  Masks are optional in the cancer centers. If you would like for your care team to wear a mask while they are taking care of you, please let them know. For doctor visits, patients may have with them one support person who is at least 78 years old. At this time, visitors are not allowed in the infusion area.   

## 2022-04-24 NOTE — Progress Notes (Signed)
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  Showed a irregular mass measuring 2.3 x 3.2 x 3.6 cm.  There were 2 other smaller masses in the left breast measuring 4 x 7 x 9 mm and 3 x 5 x 5 mm.  7 abnormal lymph nodes in the left axilla.  Indeterminate solid and cystic mass in the right breast in the retroareolar region measuring 1 x 1.2 x 1.9 cm as well as another 4.5 cm group of indeterminate microcalcifications in the lower right breast.  Patient had 2 left breast biopsy along with left lymph node biopsy.  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   PET CT scan showed hypermetabolic left breast mass and clusters of small but hypermetabolic left axillary and subpectoral lymph nodes.  1.2 x 0.9 cm subpleural nodule with an SUV of 1.6.   Patient is being treated as per keynote 522 regimen starting on 01/09/2022    Interval history-patient reports she  has had a good week last week.  Emotionally she has been better.  Appetite has been great and she has put on some weight as well.  Denies any persistent tingling numbness in her extremities.  ECOG PS- 1 Pain scale- 0   Review of systems- Review of Systems  Constitutional:  Positive for malaise/fatigue. Negative for chills, fever and weight loss.  HENT:  Negative for congestion, ear discharge and nosebleeds.   Eyes:  Negative for blurred vision.  Respiratory:  Negative for cough, hemoptysis, sputum production, shortness of breath and wheezing.   Cardiovascular:  Negative for chest pain, palpitations, orthopnea and claudication.  Gastrointestinal:  Negative for abdominal pain, blood in stool, constipation, diarrhea, heartburn, melena, nausea and vomiting.  Genitourinary:  Negative for dysuria, flank pain, frequency, hematuria and urgency.  Musculoskeletal:  Negative for back pain, joint pain and myalgias.  Skin:  Negative for rash.  Neurological:  Negative for dizziness, tingling, focal weakness, seizures, weakness and headaches.  Endo/Heme/Allergies:  Does not bruise/bleed easily.  Psychiatric/Behavioral:  Negative for depression and suicidal ideas. The patient does not have insomnia.       No Known Allergies   Past Medical History:  Diagnosis Date   Anemia    Anxiety    Arthritis    NECK   Cancer ([place])    BASAL CELL AND MELANOMA   DAVF (dural arteriovenous fistula) 2015   Dementia ([facility])    Family history of adverse reaction to anesthesia    niece has to come out of anesthesia slow  Hypertension    Pneumonia      Past Surgical History:  Procedure Laterality Date   BRAIN SURGERY  2015   DURAL AV FISTULA ([place] [facility])   BREAST BIOPSY Left 12/14/2021   [place] Bx 3:00 7 CMFN, Coil Clip, Path Pending   BREAST BIOPSY Left 12/14/2021   [place] Bx 10:00 2 CMFN, Venus Clip, path pending   breast biopsy Left 12/14/2021   [place] Axille, Hydromarker (butterfly). path pending   BREAST  BIOPSY Right 12/19/2021   Stereo bx-calcs, "COIL" clip-path pending   BREAST BIOPSY Right 12/19/2021   u/s bx-mass, 3:00, retroareolar, "HEART" clip-path pending   COLONOSCOPY     COLONOSCOPY WITH PROPOFOL N/A 02/20/2017   Procedure: COLONOSCOPY WITH PROPOFOL;  Surgeon: Manya Silvas, MD;  Location: [facility]COPY;  Service: Endoscopy;  Laterality: N/A;   HYSTEROSCOPY WITH D & C N/A 07/29/2015   Procedure: DILATATION AND CURETTAGE /HYSTEROSCOPY;  Surgeon: Benjaman Kindler, MD;  Location: [facility] ORS;  Service: Gynecology;  Laterality: N/A;   HYSTEROSCOPY WITH D & C N/A 01/24/2018   Procedure: DILATATION AND CURETTAGE /HYSTEROSCOPY;  Surgeon: Benjaman Kindler, MD;  Location: [facility] ORS;  Service: Gynecology;  Laterality: N/A;   LAPAROSCOPY N/A 01/24/2018   Procedure: LAPAROSCOPY DIAGNOSTIC;  Surgeon: Benjaman Kindler, MD;  Location: [facility] ORS;  Service: Gynecology;  Laterality: N/A;   PERIPHERAL VASCULAR THROMBECTOMY N/A 02/29/2020   Procedure: PERIPHERAL VASCULAR THROMBECTOMY / THROMBOLYSIS WITH POSSIBLE PULMONARY THROMBECTOMY / THROMBOLYSIS;  Surgeon: Algernon Huxley, MD;  Location: [place] [facility];  Service: Cardiovascular;  Laterality: N/A;   PORTACATH PLACEMENT N/A 01/08/2022   Procedure: INSERTION PORT-A-CATH;  Surgeon: Herbert Pun, MD;  Location: [facility] ORS;  Service: General;  Laterality: N/A;   TONSILLECTOMY     AGE 45   TOTAL LAPAROSCOPIC HYSTERECTOMY WITH BILATERAL SALPINGO OOPHORECTOMY Bilateral 12/28/2019   Procedure: TOTAL LAPAROSCOPIC HYSTERECTOMY WITH BILATERAL SALPINGO OOPHORECTOMY;  Surgeon: Benjaman Kindler, MD;  Location: [facility] ORS;  Service: Gynecology;  Laterality: Bilateral;   TUBAL LIGATION      Social History   Socioeconomic History   Marital status: Divorced    Spouse name: Not on file   Number of children: Not on file   Years of education: Not on file   Highest education level: Not on file  Occupational History   Not on file  Tobacco Use   Smoking  status: Former    Packs/day: 0.50    Years: 15.00    Total pack years: 7.50    Types: Cigarettes    Quit date: 07/21/2000    Years since quitting: 21.7   Smokeless tobacco: Never  Vaping Use   Vaping Use: Never used  Substance and Sexual Activity   Alcohol use: Not Currently   Drug use: No   Sexual activity: Not Currently  Other Topics Concern   Not on file  Social History Narrative   Lives alone, caregiver 73 year old daughter   Social Determinants of Health   Financial Resource Strain: Not on file  Food Insecurity: Not on file  Transportation Needs: Not on file  Physical Activity: Not on file  Stress: Not on file  Social Connections: Not on file  Intimate Partner Violence: Not on file    Family History  Problem Relation Age of Onset   Stomach cancer Sister    Hypertension Sister    Hypertension Sister    Diabetes Niece    Hypertension Niece    Breast cancer Neg Hx      Current Outpatient  Medications:    buPROPion (WELLBUTRIN XL) 150 MG 24 hr tablet, Take 150 mg by mouth every morning., Disp: , Rfl: 6   cetirizine (ZYRTEC) 10 MG tablet, Take 10 mg by mouth daily as needed for allergies., Disp: , Rfl:    dexamethasone (DECADRON) 4 MG tablet, Take 2 tablets (8 mg total) by mouth daily. Start the day after chemotherapy for 2 days., Disp: 30 tablet, Rfl: 1   lidocaine-prilocaine (EMLA) cream, Apply 1 Application topically as needed., Disp: , Rfl:    LORazepam (ATIVAN) 0.5 MG tablet, Take 1 tablet (0.5 mg total) by mouth every 6 (six) hours as needed (Nausea or vomiting)., Disp: 30 tablet, Rfl: 0   benzonatate (TESSALON) 200 MG capsule, Take 1 capsule (200 mg total) by mouth 3 (three) times daily as needed for cough. (Patient not taking: Reported on 04/24/2022), Disp: 60 capsule, Rfl: 0   DULoxetine (CYMBALTA) 30 MG capsule, Take 1 capsule (30 mg total) by mouth daily for 14 days, THEN 2 capsules (60 mg total) daily for 14 days. (Patient not taking: Reported on 04/24/2022),  Disp: 42 capsule, Rfl: 0   ondansetron (ZOFRAN) 8 MG tablet, Take 1 tablet (8 mg total) by mouth every 8 (eight) hours as needed for nausea or vomiting., Disp: 30 tablet, Rfl: 2   polyethylene glycol (MIRALAX / GLYCOLAX) 17 g packet, Take 17 g by mouth daily. (Patient not taking: Reported on 04/03/2022), Disp: , Rfl:    prochlorperazine (COMPAZINE) 10 MG tablet, Take 1 tablet (10 mg total) by mouth every 6 (six) hours as needed (Nausea or vomiting)., Disp: 30 tablet, Rfl: 1 No current facility-administered medications for this visit.  Facility-Administered Medications Ordered in Other Visits:    CARBOplatin (PARAPLATIN) 90 mg in sodium chloride 0.9 % 100 mL chemo infusion, 90 mg, Intravenous, Once, Sindy Guadeloupe, MD   heparin lock flush 100 unit/mL, 500 Units, Intracatheter, Once PRN, Sindy Guadeloupe, MD   PACLitaxel (TAXOL) 90 mg in sodium chloride 0.9 % 250 mL chemo infusion (</= 13m/m2), 65 mg/m2 (Treatment Plan Recorded), Intravenous, Once, RSindy Guadeloupe MD, Last Rate: 265 mL/hr at 04/24/22 1107, 90 mg at 04/24/22 1107  Physical exam:  Vitals:   04/24/22 0852  BP: 126/86  Pulse: 84  Resp: 16  Temp: 97.7 F (36.5 C)  SpO2: 100%  Weight: 95 lb 8 oz (43.3 kg)   Physical Exam Constitutional:      General: She is not in acute distress. Cardiovascular:     Rate and Rhythm: Normal rate and regular rhythm.     Heart sounds: Normal heart sounds.  Pulmonary:     Effort: Pulmonary effort is normal.     Breath sounds: Normal breath sounds.  Skin:    General: Skin is warm and dry.  Neurological:     Mental Status: She is alert and oriented to person, place, and time.         Latest Ref Rng & Units 04/24/2022    8:30 AM  CMP  Glucose 70 - 99 mg/dL 97   BUN 8 - 23 mg/dL 9   Creatinine 0.44 - 1.00 mg/dL 0.69   Sodium 135 - 145 mmol/L 137   Potassium 3.5 - 5.1 mmol/L 3.6   Chloride 98 - 111 mmol/L 108   CO2 22 - 32 mmol/L 24   Calcium 8.9 - 10.3 mg/dL 8.7   Total Protein 6.5 -  8.1 g/dL 6.2   Total Bilirubin 0.3 - 1.2 mg/dL 1.0   Alkaline  Phos 38 - 126 U/L 53   AST 15 - 41 U/L 21   ALT 0 - 44 U/L 12       Latest Ref Rng & Units 04/24/2022    8:30 AM  CBC  WBC 4.0 - 10.5 K/uL 3.1   Hemoglobin 12.0 - 15.0 g/dL 9.6   Hematocrit 36.0 - 46.0 % 30.0   Platelets 150 - 400 K/uL 169     No images are attached to the encounter.  US BREAST LTD UNI LEFT INC AXILLA  Result Date: 03/28/2022 CLINICAL DATA:  Patient is status post 3 site LEFT breast ultrasound-guided biopsy. There is biopsy-proven cancer 3 o'clock 7 cm from the nipple (COIL clip), metastatic nodal disease the LEFT axilla (HYDROMARK clip). A third site at 10 o'clock demonstrated a papilloma. Contralateral breast demonstrated ADH in association with a complex fibroepithelial proliferation. Patient presents for neoadjuvant chemotherapy response evaluation. EXAM: ULTRASOUND OF THE LEFT BREAST COMPARISON:  Previous exam(s). FINDINGS: On physical exam, there is a hard mass in the LEFT outer breast. Targeted ultrasound was performed of the LEFT breast. At 3 o'clock 7 cm from the nipple, there is an irregular hypoechoic mass with irregular margins and multiple internal echogenic foci consistent with calcifications. It is estimated to measure 2.8 x 1.7 x 2.9 cm, previously 3.2 x 2.3 x 3.6 cm. It is mildly less expansile in appearance in comparison to prior. This is consistent with biopsy-proven malignancy. Targeted ultrasound was performed of the LEFT axilla. There are multiple LEFT axillary lymph nodes noted in the axilla and low LEFT axilla. They do not demonstrate normal echogenic hila. HYDROMARK clip was located adjacent to biopsied lymph node. This lymph node spans approximately 5 mm, previously 8 mm. Multiple additional lymph nodes along the low LEFT axilla measure approximately 4-5 mm but do not demonstrate definitive echogenic hila. They previously measured approximately 8 mm in size. This is consistent with some  degree of treatment response. Retropectoral lymph nodes described on prior MRI are not visualized on ultrasound. IMPRESSION: 1. Mild decrease in size and expansile appearance of biopsy-proven LEFT breast malignancy consistent with some degree of treatment response. Residual mass spans approximately 2.9 cm, previously 3.6 cm. 2. Global decrease in size of LEFT axillary lymph nodes, consistent with some degree of treatment response. However residual lymph nodes remain morphologically abnormal. RECOMMENDATION: Recommend continued clinical and surgical management of biopsy proven LEFT breast malignancy and nodal disease. I have discussed the findings and recommendations with the patient. If applicable, a reminder letter will be sent to the patient regarding the next appointment. BI-RADS CATEGORY  6: Known biopsy-proven malignancy. Electronically Signed   By: Valentino Saxon M.D.   On: 03/28/2022 16:34    Assessment and plan- Patient is a 76 y.o. 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  She is here for on treatment assessment prior to cycle 2-day 1 of weekly CarboTaxol Keytruda chemotherapy.  She has completed neoadjuvant AC Keytruda chemotherapy  Counts okay to proceed with cycle 2-day 1 of CarboTaxol chemotherapy along with Keytruda.  She will directly proceed for cycle 2-day 8 of CarboTaxol chemotherapy in 1 week and I will see her back in 2 weeks for cycle 2-day 15.  She will likely require Neupogen support after day 15 of treatment.  Her white count is mildly low at 3.1 with an ANC of 2.6  Baseline TSH is currently normal.  Chemo-induced anemia: Overall stable continue to monitor   Visit Diagnosis 1. Encounter for antineoplastic chemotherapy   2. Malignant neoplasm of upper-outer quadrant of left breast in female, estrogen receptor negative ([place])   3. Encounter for antineoplastic  immunotherapy      Dr. Randa Evens, MD, MPH [facility] [phone] 04/24/2022 11:46 AM

## 2022-04-24 NOTE — Progress Notes (Signed)
Niece states this week is the best week pt has had in a long time. However, states she believes some type of reaction to benadryll last tx (pt was acting delusional) nurse stated the benadyll dose was a high for pt. Pt is not currently taking CYMBALTA due to feeling "strange" while on medication. Pts niece will like to discuss family issues that are currently going on

## 2022-04-26 LAB — T4: T4, Total: 7.7 ug/dL (ref 4.5–12.0)

## 2022-04-30 MED FILL — Dexamethasone Sodium Phosphate Inj 100 MG/10ML: INTRAMUSCULAR | Qty: 1 | Status: AC

## 2022-05-01 ENCOUNTER — Inpatient Hospital Stay: Payer: PPO

## 2022-05-01 ENCOUNTER — Other Ambulatory Visit: Payer: Self-pay | Admitting: Oncology

## 2022-05-01 VITALS — BP 131/79 | HR 79 | Temp 96.0°F | Resp 17 | Wt 94.9 lb

## 2022-05-01 DIAGNOSIS — Z2989 Encounter for other specified prophylactic measures: Secondary | ICD-10-CM

## 2022-05-01 DIAGNOSIS — C50412 Malignant neoplasm of upper-outer quadrant of left female breast: Secondary | ICD-10-CM

## 2022-05-01 DIAGNOSIS — Z5112 Encounter for antineoplastic immunotherapy: Secondary | ICD-10-CM | POA: Diagnosis not present

## 2022-05-01 LAB — COMPREHENSIVE METABOLIC PANEL
ALT: 14 U/L (ref 0–44)
AST: 23 U/L (ref 15–41)
Albumin: 3.4 g/dL — ABNORMAL LOW (ref 3.5–5.0)
Alkaline Phosphatase: 54 U/L (ref 38–126)
Anion gap: 4 — ABNORMAL LOW (ref 5–15)
BUN: 10 mg/dL (ref 8–23)
CO2: 24 mmol/L (ref 22–32)
Calcium: 8.8 mg/dL — ABNORMAL LOW (ref 8.9–10.3)
Chloride: 110 mmol/L (ref 98–111)
Creatinine, Ser: 0.66 mg/dL (ref 0.44–1.00)
GFR, Estimated: >60 mL/min (ref >60)
Glucose, Bld: 90 mg/dL (ref 70–99)
Potassium: 4 mmol/L (ref 3.5–5.1)
Sodium: 138 mmol/L (ref 135–145)
Total Bilirubin: 0.6 mg/dL (ref 0.3–1.2)
Total Protein: 6.1 g/dL — ABNORMAL LOW (ref 6.5–8.1)

## 2022-05-01 LAB — CBC WITH DIFFERENTIAL/PLATELET
Abs Immature Granulocytes: 0.02 10*3/uL (ref 0.00–0.07)
Basophils Absolute: 0 10*3/uL (ref 0.0–0.1)
Basophils Relative: 1 %
Eosinophils Absolute: 0.1 10*3/uL (ref 0.0–0.5)
Eosinophils Relative: 2 %
HCT: 31.2 % — ABNORMAL LOW (ref 36.0–46.0)
Hemoglobin: 10 g/dL — ABNORMAL LOW (ref 12.0–15.0)
Immature Granulocytes: 1 %
Lymphocytes Relative: 21 %
Lymphs Abs: 0.5 10*3/uL — ABNORMAL LOW (ref 0.7–4.0)
MCH: 31.7 pg (ref 26.0–34.0)
MCHC: 32.1 g/dL (ref 30.0–36.0)
MCV: 99 fL (ref 80.0–100.0)
Monocytes Absolute: 0.2 10*3/uL (ref 0.1–1.0)
Monocytes Relative: 9 %
Neutro Abs: 1.5 10*3/uL — ABNORMAL LOW (ref 1.7–7.7)
Neutrophils Relative %: 66 %
Platelets: 143 10*3/uL — ABNORMAL LOW (ref 150–400)
RBC: 3.15 MIL/uL — ABNORMAL LOW (ref 3.87–5.11)
RDW: 16.7 % — ABNORMAL HIGH (ref 11.5–15.5)
WBC: 2.2 10*3/uL — ABNORMAL LOW (ref 4.0–10.5)
nRBC: 0 % (ref 0.0–0.2)

## 2022-05-01 LAB — T4, FREE: Free T4: 1.03 ng/dL (ref 0.61–1.12)

## 2022-05-01 LAB — TSH: TSH: 4.476 u[IU]/mL (ref 0.350–4.500)

## 2022-05-01 MED ORDER — FAMOTIDINE IN NACL 20-0.9 MG/50ML-% IV SOLN
20.0000 mg | Freq: Once | INTRAVENOUS | Status: AC
  Administered 2022-05-01: 20 mg via INTRAVENOUS
  Filled 2022-05-01: qty 50

## 2022-05-01 MED ORDER — PALONOSETRON HCL INJECTION 0.25 MG/5ML
0.2500 mg | Freq: Once | INTRAVENOUS | Status: AC
  Administered 2022-05-01: 0.25 mg via INTRAVENOUS
  Filled 2022-05-01: qty 5

## 2022-05-01 MED ORDER — SODIUM CHLORIDE 0.9 % IV SOLN
65.0000 mg/m2 | Freq: Once | INTRAVENOUS | Status: AC
  Administered 2022-05-01: 90 mg via INTRAVENOUS
  Filled 2022-05-01: qty 15

## 2022-05-01 MED ORDER — HEPARIN SOD (PORK) LOCK FLUSH 100 UNIT/ML IV SOLN
500.0000 [IU] | Freq: Once | INTRAVENOUS | Status: AC | PRN
  Filled 2022-05-01: qty 5

## 2022-05-01 MED ORDER — SODIUM CHLORIDE 0.9 % IV SOLN
Freq: Once | INTRAVENOUS | Status: AC
  Filled 2022-05-01: qty 250

## 2022-05-01 MED ORDER — HEPARIN SOD (PORK) LOCK FLUSH 100 UNIT/ML IV SOLN
INTRAVENOUS | Status: AC
  Administered 2022-05-01: 500 [IU]
  Filled 2022-05-01: qty 5

## 2022-05-01 MED ORDER — SODIUM CHLORIDE 0.9 % IV SOLN
88.6500 mg | Freq: Once | INTRAVENOUS | Status: AC
  Administered 2022-05-01: 90 mg via INTRAVENOUS
  Filled 2022-05-01: qty 9

## 2022-05-01 MED ORDER — DIPHENHYDRAMINE HCL 50 MG/ML IJ SOLN
25.0000 mg | Freq: Once | INTRAMUSCULAR | Status: AC
  Administered 2022-05-01: 25 mg via INTRAVENOUS
  Filled 2022-05-01: qty 1

## 2022-05-01 MED ORDER — SODIUM CHLORIDE 0.9 % IV SOLN
10.0000 mg | Freq: Once | INTRAVENOUS | Status: AC
  Administered 2022-05-01: 10 mg via INTRAVENOUS
  Filled 2022-05-01: qty 10

## 2022-05-01 NOTE — Patient Instructions (Signed)
[facility]  Discharge Instructions: Thank you for choosing [facility] to provide your oncology and hematology care.  If you have a lab appointment with the Cancer Center, please go directly to the Cancer Center and check in at the registration area.  Wear comfortable clothing and clothing appropriate for easy access to any Portacath or PICC line.   We strive to give you quality time with your provider. You may need to reschedule your appointment if you arrive late (15 or more minutes).  Arriving late affects you and other patients whose appointments are after yours.  Also, if you miss three or more appointments without notifying the office, you may be dismissed from the clinic at the provider's discretion.      For prescription refill requests, have your pharmacy contact our office and allow 72 hours for refills to be completed.    Today you received the following chemotherapy and/or immunotherapy agents Taxol and Carboplatin       To help prevent nausea and vomiting after your treatment, we encourage you to take your nausea medication as directed.  BELOW ARE SYMPTOMS THAT SHOULD BE REPORTED IMMEDIATELY: *FEVER GREATER THAN 100.4 F (38 C) OR HIGHER *CHILLS OR SWEATING *NAUSEA AND VOMITING THAT IS NOT CONTROLLED WITH YOUR NAUSEA MEDICATION *UNUSUAL SHORTNESS OF BREATH *UNUSUAL BRUISING OR BLEEDING *URINARY PROBLEMS (pain or burning when urinating, or frequent urination) *BOWEL PROBLEMS (unusual diarrhea, constipation, pain near the anus) TENDERNESS IN MOUTH AND THROAT WITH OR WITHOUT PRESENCE OF ULCERS (sore throat, sores in mouth, or a toothache) UNUSUAL RASH, SWELLING OR PAIN  UNUSUAL VAGINAL DISCHARGE OR ITCHING   Items with * indicate a potential emergency and should be followed up as soon as possible or go to the Emergency Department if any problems should occur.  Please show the CHEMOTHERAPY ALERT CARD or IMMUNOTHERAPY ALERT CARD at  check-in to the Emergency Department and triage nurse.  Should you have questions after your visit or need to cancel or reschedule your appointment, please contact [facility]  [phone] and follow the prompts.  Office hours are 8:00 a.m. to 4:30 p.m. Monday - Friday. Please note that voicemails left after 4:00 p.m. may not be returned until the following business day.  We are closed weekends and major holidays. You have access to a nurse at all times for urgent questions. Please call the main number to the clinic [phone] and follow the prompts.  For any non-urgent questions, you may also contact your provider using MyChart. We now offer e-Visits for anyone 18 and older to request care online for non-urgent symptoms. For details visit [email or web address].   Also download the MyChart app! Go to the app store, search "MyChart", open the app, select [facility], and log in with your MyChart username and password.  Masks are optional in the cancer centers. If you would like for your care team to wear a mask while they are taking care of you, please let them know. For doctor visits, patients may have with them one support person who is at least 78 years old. At this time, visitors are not allowed in the infusion area.  

## 2022-05-02 ENCOUNTER — Other Ambulatory Visit: Payer: Self-pay

## 2022-05-02 ENCOUNTER — Inpatient Hospital Stay: Payer: PPO

## 2022-05-02 DIAGNOSIS — Z5112 Encounter for antineoplastic immunotherapy: Secondary | ICD-10-CM | POA: Diagnosis not present

## 2022-05-02 DIAGNOSIS — C50412 Malignant neoplasm of upper-outer quadrant of left female breast: Secondary | ICD-10-CM

## 2022-05-02 MED ORDER — FILGRASTIM-AAFI 480 MCG/0.8ML IJ SOSY
480.0000 ug | PREFILLED_SYRINGE | Freq: Once | INTRAMUSCULAR | Status: AC
  Administered 2022-05-02: 480 ug via SUBCUTANEOUS
  Filled 2022-05-02: qty 0.8

## 2022-05-07 MED FILL — Dexamethasone Sodium Phosphate Inj 100 MG/10ML: INTRAMUSCULAR | Qty: 1 | Status: AC

## 2022-05-08 ENCOUNTER — Inpatient Hospital Stay (HOSPITAL_BASED_OUTPATIENT_CLINIC_OR_DEPARTMENT_OTHER): Payer: PPO | Admitting: Oncology

## 2022-05-08 ENCOUNTER — Inpatient Hospital Stay: Payer: PPO

## 2022-05-08 ENCOUNTER — Encounter: Payer: Self-pay | Admitting: Oncology

## 2022-05-08 VITALS — BP 122/83 | HR 87 | Temp 96.6°F | Resp 16 | Wt 91.6 lb

## 2022-05-08 DIAGNOSIS — Z171 Estrogen receptor negative status [ER-]: Secondary | ICD-10-CM

## 2022-05-08 DIAGNOSIS — C50412 Malignant neoplasm of upper-outer quadrant of left female breast: Secondary | ICD-10-CM

## 2022-05-08 DIAGNOSIS — Z5112 Encounter for antineoplastic immunotherapy: Secondary | ICD-10-CM | POA: Diagnosis not present

## 2022-05-08 DIAGNOSIS — D701 Agranulocytosis secondary to cancer chemotherapy: Secondary | ICD-10-CM | POA: Diagnosis not present

## 2022-05-08 DIAGNOSIS — Z5111 Encounter for antineoplastic chemotherapy: Secondary | ICD-10-CM | POA: Diagnosis not present

## 2022-05-08 DIAGNOSIS — T451X5A Adverse effect of antineoplastic and immunosuppressive drugs, initial encounter: Secondary | ICD-10-CM | POA: Diagnosis not present

## 2022-05-08 LAB — COMPREHENSIVE METABOLIC PANEL
ALT: 15 U/L (ref 0–44)
AST: 26 U/L (ref 15–41)
Albumin: 3.5 g/dL (ref 3.5–5.0)
Alkaline Phosphatase: 56 U/L (ref 38–126)
Anion gap: 5 (ref 5–15)
BUN: 10 mg/dL (ref 8–23)
CO2: 25 mmol/L (ref 22–32)
Calcium: 8.8 mg/dL — ABNORMAL LOW (ref 8.9–10.3)
Chloride: 108 mmol/L (ref 98–111)
Creatinine, Ser: 0.67 mg/dL (ref 0.44–1.00)
GFR, Estimated: >60 mL/min (ref >60)
Glucose, Bld: 105 mg/dL — ABNORMAL HIGH (ref 70–99)
Potassium: 4 mmol/L (ref 3.5–5.1)
Sodium: 138 mmol/L (ref 135–145)
Total Bilirubin: 0.7 mg/dL (ref 0.3–1.2)
Total Protein: 6.6 g/dL (ref 6.5–8.1)

## 2022-05-08 LAB — CBC WITH DIFFERENTIAL/PLATELET
Abs Immature Granulocytes: 0.02 10*3/uL (ref 0.00–0.07)
Basophils Absolute: 0 10*3/uL (ref 0.0–0.1)
Basophils Relative: 1 %
Eosinophils Absolute: 0 10*3/uL (ref 0.0–0.5)
Eosinophils Relative: 2 %
HCT: 30.9 % — ABNORMAL LOW (ref 36.0–46.0)
Hemoglobin: 9.9 g/dL — ABNORMAL LOW (ref 12.0–15.0)
Immature Granulocytes: 1 %
Lymphocytes Relative: 23 %
Lymphs Abs: 0.5 10*3/uL — ABNORMAL LOW (ref 0.7–4.0)
MCH: 32 pg (ref 26.0–34.0)
MCHC: 32 g/dL (ref 30.0–36.0)
MCV: 100 fL (ref 80.0–100.0)
Monocytes Absolute: 0.5 10*3/uL (ref 0.1–1.0)
Monocytes Relative: 22 %
Neutro Abs: 1.1 10*3/uL — ABNORMAL LOW (ref 1.7–7.7)
Neutrophils Relative %: 51 %
Platelets: 155 10*3/uL (ref 150–400)
RBC: 3.09 MIL/uL — ABNORMAL LOW (ref 3.87–5.11)
RDW: 17.3 % — ABNORMAL HIGH (ref 11.5–15.5)
WBC: 2.1 10*3/uL — ABNORMAL LOW (ref 4.0–10.5)
nRBC: 0 % (ref 0.0–0.2)

## 2022-05-08 MED ORDER — HEPARIN SOD (PORK) LOCK FLUSH 100 UNIT/ML IV SOLN
500.0000 [IU] | Freq: Once | INTRAVENOUS | Status: AC | PRN
  Administered 2022-05-08: 500 [IU]
  Filled 2022-05-08: qty 5

## 2022-05-08 MED ORDER — SODIUM CHLORIDE 0.9% FLUSH
10.0000 mL | Freq: Once | INTRAVENOUS | Status: AC
  Administered 2022-05-08: 10 mL via INTRAVENOUS
  Filled 2022-05-08: qty 10

## 2022-05-08 MED ORDER — DIPHENHYDRAMINE HCL 50 MG/ML IJ SOLN
25.0000 mg | Freq: Once | INTRAMUSCULAR | Status: AC
  Administered 2022-05-08: 25 mg via INTRAVENOUS
  Filled 2022-05-08: qty 1

## 2022-05-08 MED ORDER — SODIUM CHLORIDE 0.9 % IV SOLN
65.0000 mg/m2 | Freq: Once | INTRAVENOUS | Status: AC
  Administered 2022-05-08: 90 mg via INTRAVENOUS
  Filled 2022-05-08: qty 15

## 2022-05-08 MED ORDER — SODIUM CHLORIDE 0.9 % IV SOLN
10.0000 mg | Freq: Once | INTRAVENOUS | Status: AC
  Administered 2022-05-08: 10 mg via INTRAVENOUS
  Filled 2022-05-08: qty 10

## 2022-05-08 MED ORDER — FAMOTIDINE IN NACL 20-0.9 MG/50ML-% IV SOLN
20.0000 mg | Freq: Once | INTRAVENOUS | Status: AC
  Administered 2022-05-08: 20 mg via INTRAVENOUS
  Filled 2022-05-08: qty 50

## 2022-05-08 MED ORDER — SODIUM CHLORIDE 0.9 % IV SOLN
88.6500 mg | Freq: Once | INTRAVENOUS | Status: AC
  Administered 2022-05-08: 90 mg via INTRAVENOUS
  Filled 2022-05-08: qty 9

## 2022-05-08 MED ORDER — HEPARIN SOD (PORK) LOCK FLUSH 100 UNIT/ML IV SOLN
500.0000 [IU] | Freq: Once | INTRAVENOUS | Status: DC
  Filled 2022-05-08: qty 5

## 2022-05-08 MED ORDER — SODIUM CHLORIDE 0.9 % IV SOLN
Freq: Once | INTRAVENOUS | Status: AC
  Filled 2022-05-08: qty 250

## 2022-05-08 MED ORDER — PALONOSETRON HCL INJECTION 0.25 MG/5ML
0.2500 mg | Freq: Once | INTRAVENOUS | Status: AC
  Administered 2022-05-08: 0.25 mg via INTRAVENOUS
  Filled 2022-05-08: qty 5

## 2022-05-08 NOTE — Progress Notes (Signed)
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  Showed a irregular mass measuring 2.3 x 3.2 x 3.6 cm.  There were 2 other smaller masses in the left breast measuring 4 x 7 x 9 mm and 3 x 5 x 5 mm.  7 abnormal lymph nodes in the left axilla.  Indeterminate solid and cystic mass in the right breast in the retroareolar region measuring 1 x 1.2 x 1.9 cm as well as another 4.5 cm group of indeterminate microcalcifications in the lower right breast.  Patient had 2 left breast biopsy along with left lymph node biopsy.  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   PET CT scan showed hypermetabolic left breast mass and clusters of small but hypermetabolic left axillary and subpectoral lymph nodes.  1.2 x 0.9 cm subpleural nodule with an SUV of 1.6.   Patient is being treated as per keynote 522 regimen starting on 01/09/2022    Interval history-oral intake has not been good.  She has lost  about 3 pounds in the last 1 week.  Denies any significant nausea vomiting diarrhea or peripheral neuropathy  ECOG PS- 1 Pain scale- 0   Review of systems- Review of Systems  Constitutional:  Negative for chills, fever, malaise/fatigue and weight loss.  HENT:  Negative for congestion, ear discharge and nosebleeds.   Eyes:  Negative for blurred vision.  Respiratory:  Negative for cough, hemoptysis, sputum production, shortness of breath and wheezing.   Cardiovascular:  Negative for chest pain, palpitations, orthopnea and claudication.  Gastrointestinal:  Negative for abdominal pain, blood in stool, constipation, diarrhea, heartburn, melena, nausea and vomiting.  Genitourinary:  Negative for dysuria, flank pain, frequency, hematuria and urgency.  Musculoskeletal:  Negative for back pain, joint pain and myalgias.  Skin:  Negative for rash.  Neurological:  Negative for dizziness, tingling, focal weakness, seizures, weakness and headaches.  Endo/Heme/Allergies:  Does not bruise/bleed easily.  Psychiatric/Behavioral:  Negative for depression and suicidal ideas. The patient does not have insomnia.       No Known Allergies   Past Medical History:  Diagnosis Date   Anemia    Anxiety    Arthritis    NECK   Cancer ([place])    BASAL CELL AND MELANOMA   DAVF (dural arteriovenous fistula) 2015   Dementia (HCC)    Family history of adverse reaction to anesthesia    niece has to come out of anesthesia slow   Hypertension    Pneumonia      Past Surgical History:  Procedure  Laterality Date   BRAIN SURGERY  2015   DURAL AV FISTULA ([facility])   BREAST BIOPSY Left 12/14/2021   [place] Bx 3:00 7 CMFN, Coil Clip, Path Pending   BREAST BIOPSY Left 12/14/2021   [place] Bx 10:00 2 CMFN, Venus Clip, path pending   breast biopsy Left 12/14/2021   [place] Axille, Hydromarker (butterfly). path pending   BREAST BIOPSY Right 12/19/2021   Stereo bx-calcs, "COIL" clip-path pending   BREAST BIOPSY Right  12/19/2021   u/s bx-mass, 3:00, retroareolar, "HEART" clip-path pending   COLONOSCOPY     COLONOSCOPY WITH PROPOFOL N/A 02/20/2017   Procedure: COLONOSCOPY WITH PROPOFOL;  Surgeon: Manya Silvas, MD;  Location: [facility] ENDOSCOPY;  Service: Endoscopy;  Laterality: N/A;   HYSTEROSCOPY WITH D & C N/A 07/29/2015   Procedure: DILATATION AND CURETTAGE /HYSTEROSCOPY;  Surgeon: Benjaman Kindler, MD;  Location: [facility] ORS;  Service: Gynecology;  Laterality: N/A;   HYSTEROSCOPY WITH D & C N/A 01/24/2018   Procedure: DILATATION AND CURETTAGE /HYSTEROSCOPY;  Surgeon: Benjaman Kindler, MD;  Location: [facility] ORS;  Service: Gynecology;  Laterality: N/A;   LAPAROSCOPY N/A 01/24/2018   Procedure: LAPAROSCOPY DIAGNOSTIC;  Surgeon: Benjaman Kindler, MD;  Location: [facility] ORS;  Service: Gynecology;  Laterality: N/A;   PERIPHERAL VASCULAR THROMBECTOMY N/A 02/29/2020   Procedure: PERIPHERAL VASCULAR THROMBECTOMY / THROMBOLYSIS WITH POSSIBLE PULMONARY THROMBECTOMY / THROMBOLYSIS;  Surgeon: Algernon Huxley, MD;  Location: [facility];  Service: Cardiovascular;  Laterality: N/A;   PORTACATH PLACEMENT N/A 01/08/2022   Procedure: INSERTION PORT-A-CATH;  Surgeon: Herbert Pun, MD;  Location: [facility] ORS;  Service: General;  Laterality: N/A;   TONSILLECTOMY     AGE 15   TOTAL LAPAROSCOPIC HYSTERECTOMY WITH BILATERAL SALPINGO OOPHORECTOMY Bilateral 12/28/2019   Procedure: TOTAL LAPAROSCOPIC HYSTERECTOMY WITH BILATERAL SALPINGO OOPHORECTOMY;  Surgeon: Benjaman Kindler, MD;  Location: [facility] OR[facility];  Service: Gynecology;  Laterality: Bilateral;   TUBAL LIGATION      Social History   Socioeconomic History   Marital status: Divorced    Spouse name: Not on file   Number of children: Not on file   Years of education: Not on file   Highest education level: Not on file  Occupational History   Not on file  Tobacco Use   Smoking status: Former    Packs/day: 0.50    Years: 15.00    Total pack years: 7.50    Types:  Cigarettes    Quit date: 07/21/2000    Years since quitting: 21.8   Smokeless tobacco: Never  Vaping Use   Vaping Use: Never used  Substance and Sexual Activity   Alcohol use: Not Currently   Drug use: No   Sexual activity: Not Currently  Other Topics Concern   Not on file  Social History Narrative   Lives alone, caregiver 8 year old daughter   Social Determinants of Health   Financial Resource Strain: Not on file  Food Insecurity: Not on file  Transportation Needs: Not on file  Physical Activity: Not on file  Stress: Not on file  Social Connections: Not on file  Intimate Partner Violence: Not on file    Family History  Problem Relation Age of Onset   Stomach cancer Sister    Hypertension Sister    Hypertension Sister    Diabetes Niece    Hypertension Niece    Breast cancer Neg Hx      Current Outpatient Medications:    buPROPion (WELLBUTRIN XL) 150 MG 24 hr tablet, Take 150 mg  by mouth every morning., Disp: , Rfl: 6   cetirizine (ZYRTEC) 10 MG tablet, Take 10 mg by mouth daily as needed for allergies., Disp: , Rfl:    lidocaine-prilocaine (EMLA) cream, Apply 1 Application topically as needed., Disp: , Rfl:    benzonatate (TESSALON) 200 MG capsule, Take 1 capsule (200 mg total) by mouth 3 (three) times daily as needed for cough. (Patient not taking: Reported on 04/24/2022), Disp: 60 capsule, Rfl: 0   dexamethasone (DECADRON) 4 MG tablet, Take 2 tablets (8 mg total) by mouth daily. Start the day after chemotherapy for 2 days. (Patient not taking: Reported on 05/08/2022), Disp: 30 tablet, Rfl: 1   DULoxetine (CYMBALTA) 30 MG capsule, Take 1 capsule (30 mg total) by mouth daily for 14 days, THEN 2 capsules (60 mg total) daily for 14 days. (Patient not taking: Reported on 04/24/2022), Disp: 42 capsule, Rfl: 0   LORazepam (ATIVAN) 0.5 MG tablet, Take 1 tablet (0.5 mg total) by mouth every 6 (six) hours as needed (Nausea or vomiting). (Patient not taking: Reported on  05/08/2022), Disp: 30 tablet, Rfl: 0   ondansetron (ZOFRAN) 8 MG tablet, Take 1 tablet (8 mg total) by mouth every 8 (eight) hours as needed for nausea or vomiting. (Patient not taking: Reported on 05/08/2022), Disp: 30 tablet, Rfl: 2   polyethylene glycol (MIRALAX / GLYCOLAX) 17 g packet, Take 17 g by mouth daily. (Patient not taking: Reported on 04/03/2022), Disp: , Rfl:    prochlorperazine (COMPAZINE) 10 MG tablet, Take 1 tablet (10 mg total) by mouth every 6 (six) hours as needed (Nausea or vomiting). (Patient not taking: Reported on 05/08/2022), Disp: 30 tablet, Rfl: 1 No current facility-administered medications for this visit.  Facility-Administered Medications Ordered in Other Visits:    heparin lock flush 100 unit/mL, 500 Units, Intravenous, Once, Sindy Guadeloupe, MD  Physical exam:  Vitals:   05/08/22 1101  BP: 122/83  Pulse: 87  Resp: 16  Temp: (!) 96.6 F (35.9 C)  TempSrc: Tympanic  SpO2: 100%  Weight: 91 lb 9.6 oz (41.5 kg)   Physical Exam Constitutional:      General: She is not in acute distress. Cardiovascular:     Rate and Rhythm: Normal rate and regular rhythm.     Heart sounds: Normal heart sounds.  Pulmonary:     Effort: Pulmonary effort is normal.     Breath sounds: Normal breath sounds.  Abdominal:     General: Bowel sounds are normal.     Palpations: Abdomen is soft.  Skin:    General: Skin is warm and dry.  Neurological:     Mental Status: She is alert and oriented to person, place, and time.   Breast exam: There is a palpable left breast mass roughly measuring 2 cm in size and appears smaller to palpation as compared to prior exams.  No palpable left axillary adenopathy.     Latest Ref Rng & Units 05/08/2022   10:26 AM  CMP  Glucose 70 - 99 mg/dL 105   BUN 8 - 23 mg/dL 10   Creatinine 0.44 - 1.00 mg/dL 0.67   Sodium 135 - 145 mmol/L 138   Potassium 3.5 - 5.1 mmol/L 4.0   Chloride 98 - 111 mmol/L 108   CO2 22 - 32 mmol/L 25   Calcium 8.9 -  10.3 mg/dL 8.8   Total Protein 6.5 - 8.1 g/dL 6.6   Total Bilirubin 0.3 - 1.2 mg/dL 0.7   Alkaline Phos 38 - 126  U/L 56   AST 15 - 41 U/L 26   ALT 0 - 44 U/L 15       Latest Ref Rng & Units 05/08/2022   10:26 AM  CBC  WBC 4.0 - 10.5 K/uL 2.1   Hemoglobin 12.0 - 15.0 g/dL 9.9   Hematocrit 36.0 - 46.0 % 30.9   Platelets 150 - 400 K/uL 155      Assessment and plan- Patient is a 76 y.o. 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  She is here for on treatment assessment prior to cycle 2-day 1 of weekly CarboTaxol Keytruda chemotherapy.  She is here for on treatment assessment prior to cycle 2-day 15 of CarboTaxol chemotherapy  White cell count is 2.1 today with an ANC of 1.1.  She will proceed with cycle 2-day 15 of CarboTaxol chemotherapy today but will receive Neupogen for 3 days starting tomorrow.  She will directly proceed for [facility] chemotherapy next week.  I will see her in 2 weeks for cycle 3-day 8 of CarboTaxol chemotherapy.  So far patient is tolerating treatments well without any significant side effects but her poor appetite and weight loss is concerning.  Chemo-induced anemia: Will check ferritin and iron studies B12 and folate with next set of labs  Chemo-induced neutropenia: Plan as above   Visit Diagnosis 1. Malignant neoplasm of upper-outer quadrant of left breast in female, estrogen receptor negative ([place])   2. Encounter for antineoplastic chemotherapy      Dr. Randa Evens, MD, MPH [facility] at [facility] [phone] 05/08/2022 4:44 PM

## 2022-05-08 NOTE — Progress Notes (Signed)
Pt in for follow up, weight is down 3 lbs and appetite is decreased.

## 2022-05-09 ENCOUNTER — Other Ambulatory Visit: Payer: Self-pay

## 2022-05-09 ENCOUNTER — Inpatient Hospital Stay: Payer: PPO

## 2022-05-09 DIAGNOSIS — Z5112 Encounter for antineoplastic immunotherapy: Secondary | ICD-10-CM | POA: Diagnosis not present

## 2022-05-09 DIAGNOSIS — Z171 Estrogen receptor negative status [ER-]: Secondary | ICD-10-CM

## 2022-05-09 MED ORDER — FILGRASTIM-AAFI 480 MCG/0.8ML IJ SOSY
480.0000 ug | PREFILLED_SYRINGE | Freq: Once | INTRAMUSCULAR | Status: AC
  Administered 2022-05-09: 480 ug via SUBCUTANEOUS
  Filled 2022-05-09: qty 0.8

## 2022-05-10 ENCOUNTER — Other Ambulatory Visit: Payer: Self-pay

## 2022-05-10 ENCOUNTER — Inpatient Hospital Stay: Payer: PPO

## 2022-05-10 DIAGNOSIS — Z5112 Encounter for antineoplastic immunotherapy: Secondary | ICD-10-CM | POA: Diagnosis not present

## 2022-05-10 DIAGNOSIS — Z171 Estrogen receptor negative status [ER-]: Secondary | ICD-10-CM

## 2022-05-10 MED ORDER — FILGRASTIM-AAFI 480 MCG/0.8ML IJ SOSY
480.0000 ug | PREFILLED_SYRINGE | Freq: Once | INTRAMUSCULAR | Status: AC
  Administered 2022-05-10: 480 ug via SUBCUTANEOUS
  Filled 2022-05-10: qty 0.8

## 2022-05-11 ENCOUNTER — Inpatient Hospital Stay: Payer: PPO | Attending: Oncology

## 2022-05-11 DIAGNOSIS — Z5111 Encounter for antineoplastic chemotherapy: Secondary | ICD-10-CM | POA: Insufficient documentation

## 2022-05-11 DIAGNOSIS — Z5189 Encounter for other specified aftercare: Secondary | ICD-10-CM | POA: Diagnosis not present

## 2022-05-11 DIAGNOSIS — Z171 Estrogen receptor negative status [ER-]: Secondary | ICD-10-CM | POA: Insufficient documentation

## 2022-05-11 DIAGNOSIS — Z87891 Personal history of nicotine dependence: Secondary | ICD-10-CM | POA: Insufficient documentation

## 2022-05-11 DIAGNOSIS — C50412 Malignant neoplasm of upper-outer quadrant of left female breast: Secondary | ICD-10-CM | POA: Diagnosis not present

## 2022-05-11 DIAGNOSIS — D6481 Anemia due to antineoplastic chemotherapy: Secondary | ICD-10-CM | POA: Insufficient documentation

## 2022-05-11 DIAGNOSIS — Z79899 Other long term (current) drug therapy: Secondary | ICD-10-CM | POA: Insufficient documentation

## 2022-05-11 DIAGNOSIS — T451X5A Adverse effect of antineoplastic and immunosuppressive drugs, initial encounter: Secondary | ICD-10-CM | POA: Diagnosis not present

## 2022-05-11 DIAGNOSIS — D701 Agranulocytosis secondary to cancer chemotherapy: Secondary | ICD-10-CM | POA: Diagnosis not present

## 2022-05-11 DIAGNOSIS — Z5112 Encounter for antineoplastic immunotherapy: Secondary | ICD-10-CM | POA: Insufficient documentation

## 2022-05-11 MED ORDER — FILGRASTIM-AAFI 480 MCG/0.8ML IJ SOSY: 480.0000 ug | PREFILLED_SYRINGE | Freq: Once | INTRAMUSCULAR | Status: DC

## 2022-05-11 MED ORDER — FILGRASTIM-SNDZ 480 MCG/0.8ML IJ SOSY
480.0000 ug | PREFILLED_SYRINGE | Freq: Once | INTRAMUSCULAR | Status: AC
  Administered 2022-05-11: 480 ug via SUBCUTANEOUS
  Filled 2022-05-11: qty 0.8

## 2022-05-14 MED FILL — Dexamethasone Sodium Phosphate Inj 100 MG/10ML: INTRAMUSCULAR | Qty: 1 | Status: AC

## 2022-05-15 ENCOUNTER — Inpatient Hospital Stay: Payer: PPO

## 2022-05-15 VITALS — BP 110/67 | HR 64 | Temp 96.7°F | Resp 16

## 2022-05-15 DIAGNOSIS — C50412 Malignant neoplasm of upper-outer quadrant of left female breast: Secondary | ICD-10-CM

## 2022-05-15 DIAGNOSIS — Z2989 Encounter for other specified prophylactic measures: Secondary | ICD-10-CM

## 2022-05-15 DIAGNOSIS — Z5112 Encounter for antineoplastic immunotherapy: Secondary | ICD-10-CM | POA: Diagnosis not present

## 2022-05-15 LAB — CBC WITH DIFFERENTIAL/PLATELET
Abs Immature Granulocytes: 0.23 10*3/uL — ABNORMAL HIGH (ref 0.00–0.07)
Basophils Absolute: 0.1 10*3/uL (ref 0.0–0.1)
Basophils Relative: 2 %
Eosinophils Absolute: 0.1 10*3/uL (ref 0.0–0.5)
Eosinophils Relative: 2 %
HCT: 32.2 % — ABNORMAL LOW (ref 36.0–46.0)
Hemoglobin: 9.9 g/dL — ABNORMAL LOW (ref 12.0–15.0)
Immature Granulocytes: 5 %
Lymphocytes Relative: 19 %
Lymphs Abs: 0.8 10*3/uL (ref 0.7–4.0)
MCH: 31.6 pg (ref 26.0–34.0)
MCHC: 30.7 g/dL (ref 30.0–36.0)
MCV: 102.9 fL — ABNORMAL HIGH (ref 80.0–100.0)
Monocytes Absolute: 0.9 10*3/uL (ref 0.1–1.0)
Monocytes Relative: 20 %
Neutro Abs: 2.2 10*3/uL (ref 1.7–7.7)
Neutrophils Relative %: 52 %
Platelets: 223 10*3/uL (ref 150–400)
RBC: 3.13 MIL/uL — ABNORMAL LOW (ref 3.87–5.11)
RDW: 18.3 % — ABNORMAL HIGH (ref 11.5–15.5)
WBC: 4.3 10*3/uL (ref 4.0–10.5)
nRBC: 0.5 % — ABNORMAL HIGH (ref 0.0–0.2)

## 2022-05-15 LAB — COMPREHENSIVE METABOLIC PANEL
ALT: 13 U/L (ref 0–44)
AST: 24 U/L (ref 15–41)
Albumin: 3.4 g/dL — ABNORMAL LOW (ref 3.5–5.0)
Alkaline Phosphatase: 68 U/L (ref 38–126)
Anion gap: 7 (ref 5–15)
BUN: 10 mg/dL (ref 8–23)
CO2: 26 mmol/L (ref 22–32)
Calcium: 8.9 mg/dL (ref 8.9–10.3)
Chloride: 108 mmol/L (ref 98–111)
Creatinine, Ser: 0.74 mg/dL (ref 0.44–1.00)
GFR, Estimated: >60 mL/min (ref >60)
Glucose, Bld: 88 mg/dL (ref 70–99)
Potassium: 4.1 mmol/L (ref 3.5–5.1)
Sodium: 141 mmol/L (ref 135–145)
Total Bilirubin: 0.5 mg/dL (ref 0.3–1.2)
Total Protein: 6.1 g/dL — ABNORMAL LOW (ref 6.5–8.1)

## 2022-05-15 MED ORDER — SODIUM CHLORIDE 0.9% FLUSH
10.0000 mL | INTRAVENOUS | Status: DC | PRN
  Administered 2022-05-15: 10 mL
  Filled 2022-05-15: qty 10

## 2022-05-15 MED ORDER — SODIUM CHLORIDE 0.9 % IV SOLN
200.0000 mg | Freq: Once | INTRAVENOUS | Status: AC
  Administered 2022-05-15: 200 mg via INTRAVENOUS
  Filled 2022-05-15: qty 8

## 2022-05-15 MED ORDER — HEPARIN SOD (PORK) LOCK FLUSH 100 UNIT/ML IV SOLN
500.0000 [IU] | Freq: Once | INTRAVENOUS | Status: AC | PRN
  Administered 2022-05-15: 500 [IU]
  Filled 2022-05-15: qty 5

## 2022-05-15 MED ORDER — DIPHENHYDRAMINE HCL 50 MG/ML IJ SOLN
25.0000 mg | Freq: Once | INTRAMUSCULAR | Status: AC
  Administered 2022-05-15: 25 mg via INTRAVENOUS
  Filled 2022-05-15: qty 1

## 2022-05-15 MED ORDER — SODIUM CHLORIDE 0.9 % IV SOLN
10.0000 mg | Freq: Once | INTRAVENOUS | Status: AC
  Administered 2022-05-15: 10 mg via INTRAVENOUS
  Filled 2022-05-15: qty 10

## 2022-05-15 MED ORDER — SODIUM CHLORIDE 0.9 % IV SOLN
Freq: Once | INTRAVENOUS | Status: AC
  Filled 2022-05-15: qty 250

## 2022-05-15 MED ORDER — SODIUM CHLORIDE 0.9 % IV SOLN
65.0000 mg/m2 | Freq: Once | INTRAVENOUS | Status: AC
  Administered 2022-05-15: 90 mg via INTRAVENOUS
  Filled 2022-05-15: qty 15

## 2022-05-15 MED ORDER — SODIUM CHLORIDE 0.9 % IV SOLN
88.6500 mg | Freq: Once | INTRAVENOUS | Status: AC
  Administered 2022-05-15: 90 mg via INTRAVENOUS
  Filled 2022-05-15: qty 9

## 2022-05-15 MED ORDER — PALONOSETRON HCL INJECTION 0.25 MG/5ML
0.2500 mg | Freq: Once | INTRAVENOUS | Status: AC
  Administered 2022-05-15: 0.25 mg via INTRAVENOUS
  Filled 2022-05-15: qty 5

## 2022-05-15 MED ORDER — FAMOTIDINE IN NACL 20-0.9 MG/50ML-% IV SOLN
20.0000 mg | Freq: Once | INTRAVENOUS | Status: AC
  Administered 2022-05-15: 20 mg via INTRAVENOUS
  Filled 2022-05-15: qty 50

## 2022-05-15 NOTE — Patient Instructions (Signed)

## 2022-05-16 DIAGNOSIS — I493 Ventricular premature depolarization: Secondary | ICD-10-CM | POA: Diagnosis not present

## 2022-05-16 DIAGNOSIS — I82412 Acute embolism and thrombosis of left femoral vein: Secondary | ICD-10-CM | POA: Diagnosis not present

## 2022-05-16 LAB — T4: T4, Total: 7.1 ug/dL (ref 4.5–12.0)

## 2022-05-20 IMAGING — CT CT CHEST W/ CM
3 of 7 series · 16 of 40 positions shown, 18 images · IV contrast (omnipaque)
Comparison: July 22, 2020

CLINICAL DATA: Follow-up pulmonary nodule.  No acute symptoms.

EXAM:
CT CHEST WITH CONTRAST
TECHNIQUE: Multidetector CT imaging of the chest was performed during
intravenous contrast administration.
CONTRAST:  75mL OMNIPAQUE IOHEXOL 300 MG/ML  SOLN

[Series 2: axial chest 2.00 · axial · 0.62mm/px · z∈[-1176,-954]mm · 7 of 159 slices shown]
[im 16/159  lung]
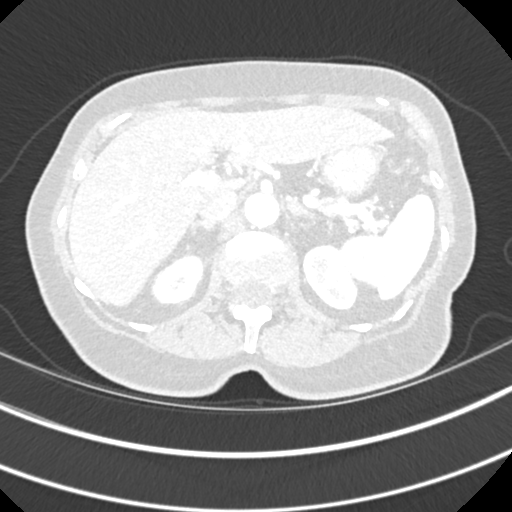
[im 32/159  lung]
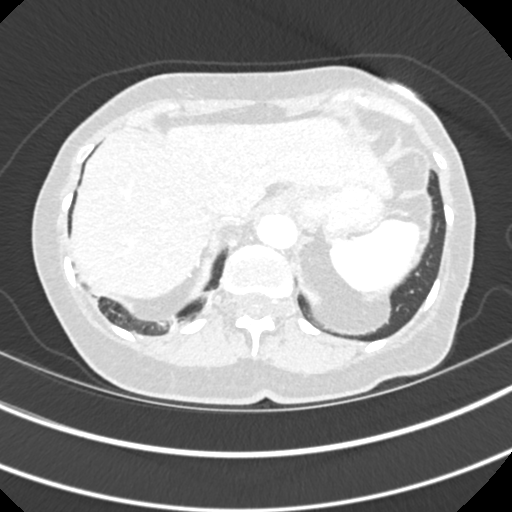
[im 64/159  lung]
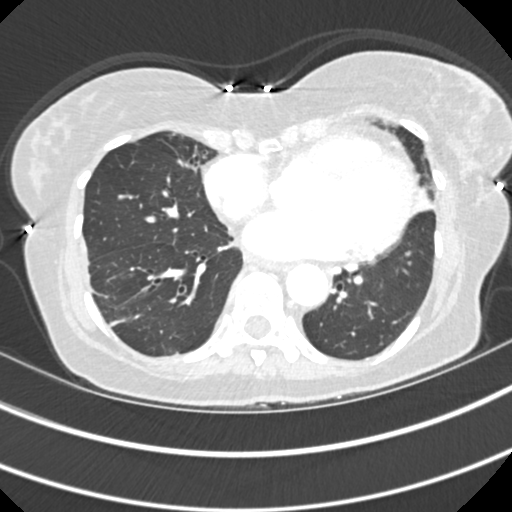
[im 80/159  lung]
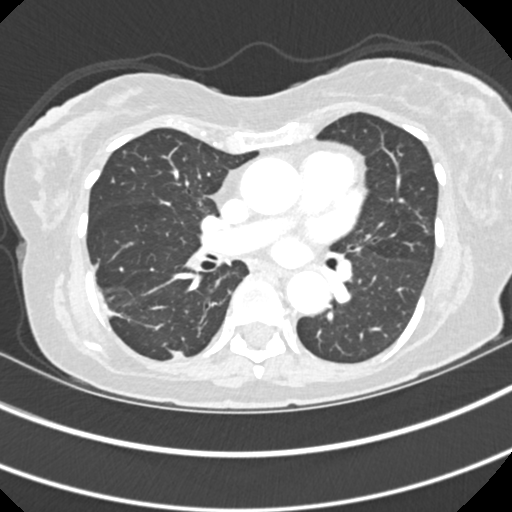
[im 82/159  lung]
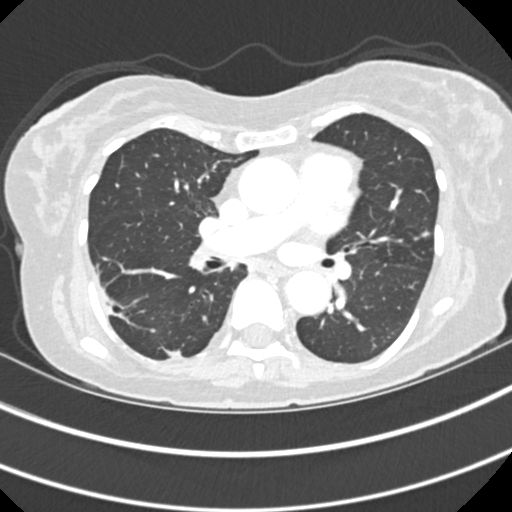
[im 95/159  lung]
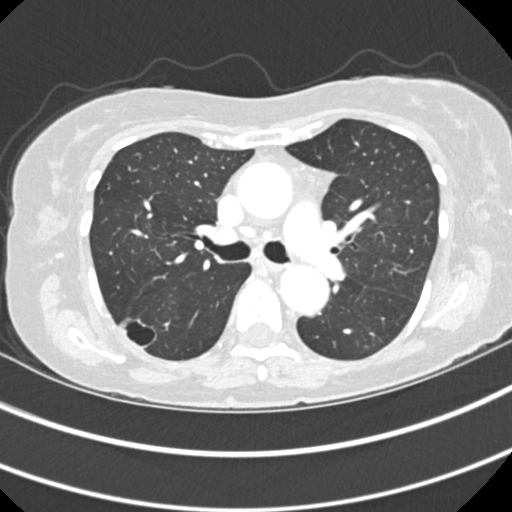
[im 127/159  lung]
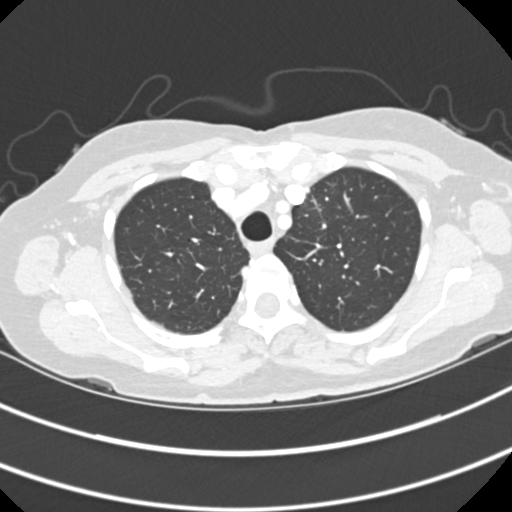

[Series 4: coronal chest 2.00 cor · coronal · 0.62mm/px · 3 of 140 slices shown]
[im 28/140  lung]
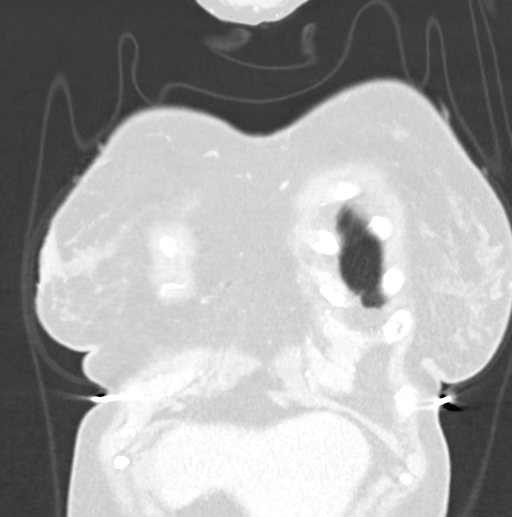
[im 56/140  lung]
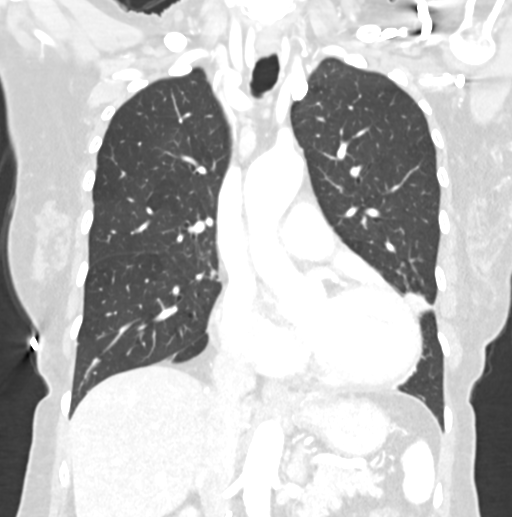
[im 84/140  lung]
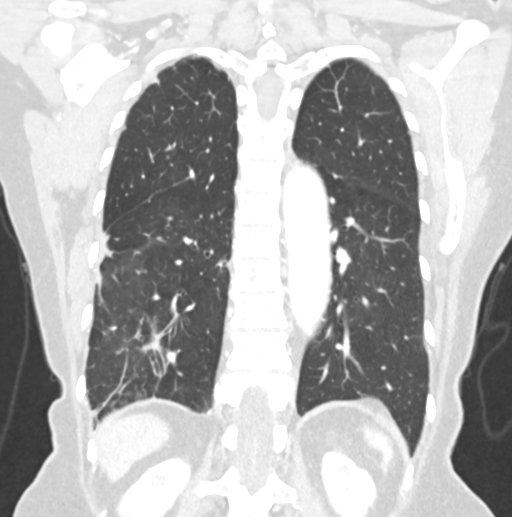

[Series 11: axial chest 3.00 ax · axial · 0.60mm/px · z∈[-1156,-943]mm · 6 of 107 slices shown, 8 images]
[im 18/107  mediastinal]
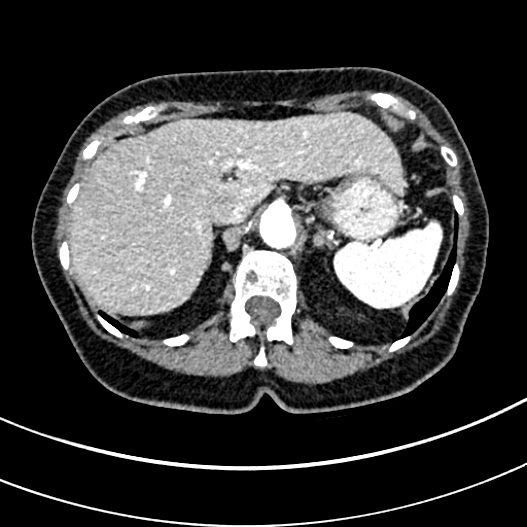
[im 18/107  lung]
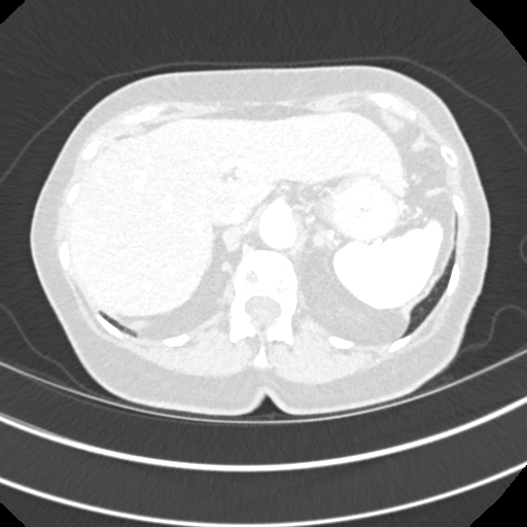
[im 36/107  lung]
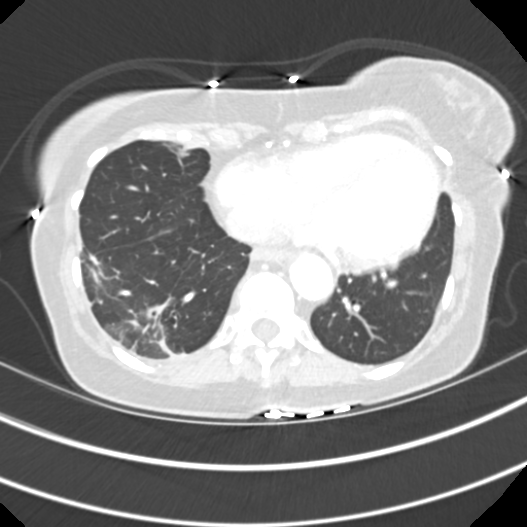
[im 54/107  lung]
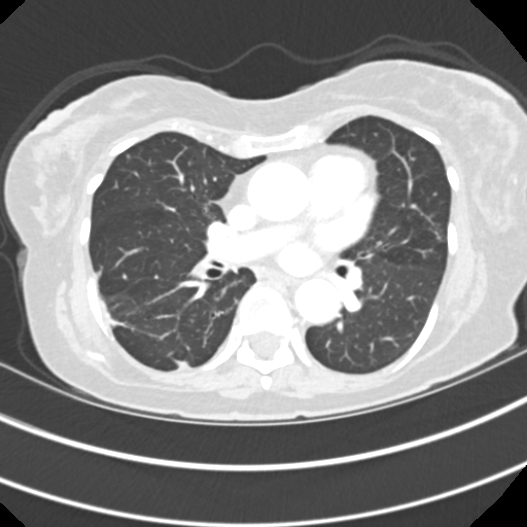
[im 56/107  lung]
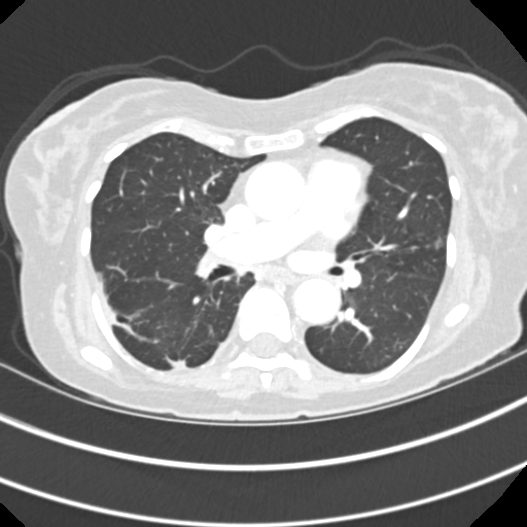
[im 71/107  mediastinal]
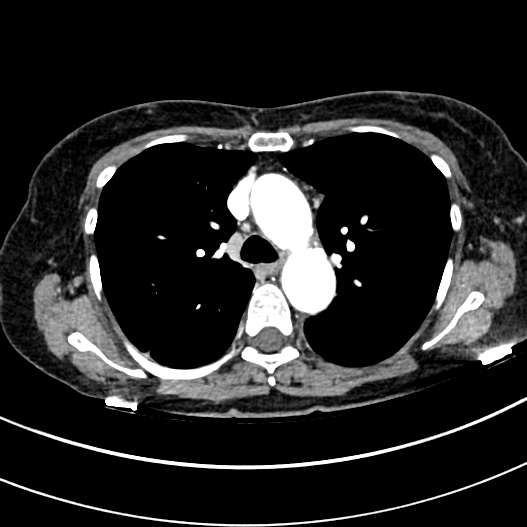
[im 71/107  lung]
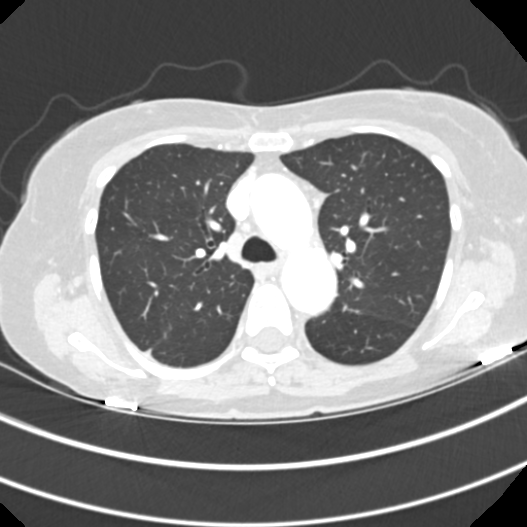
[im 89/107  lung]
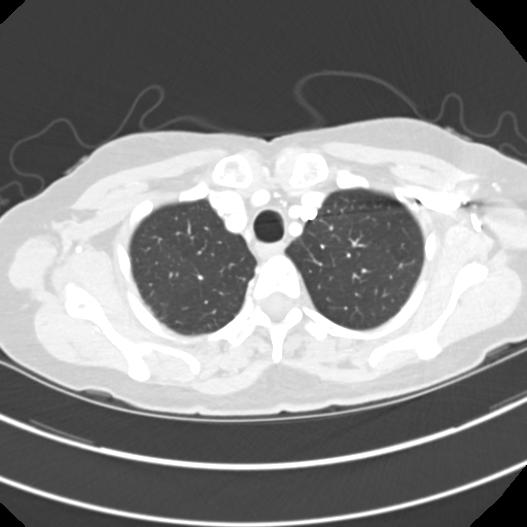

[16 of 40 positions shown; findings below may reference images not displayed]

FINDINGS: Cardiovascular: Mild atherosclerotic changes seen in the thoracic
aorta without aneurysm or dissection. Limited views of the coronary
arteries are unremarkable. The heart remains stable. Central
pulmonary arteries are unremarkable.

Mediastinum/Nodes: Chest wall is normal. The thyroid and esophagus
are normal. Shotty nodes in the mediastinum are again identified.
The dominant pretracheal node on series 2, image 45 measures 8 mm,
unchanged. The subcarinal node is also unchanged measuring 1.2 cm.
Mildly prominent right hilar nodes are stable. No new or worsening
adenopathy. No pleural or pericardial effusions.

Lungs/Pleura: In the posteromedial right lower lobe, abutting the
oblique fissure, there is a discrete nodule on series 3, image 88
measuring 11 x 11 by 12 mm on today's study and the previous study.

A cyst, likely acquired, abuts the posterolateral right pleura on
series 3, image 69, stable. Streaky opacities in the right base
demonstrate chronic components consistent with scarring. There is
new opacity in the posterior right base on series 3, image 104 with
associated bronchiectasis. Increased ground-glass in the posterior
right lower lobe inferiorly on series 3, image 125. Focal
bronchiectasis in the anterior inferior right upper lobe on series
3, image 100, stable.

Several calcified granulomas are seen in the lingula. There is
scarring or atelectasis in the lingula. Two noncalcified nodules are
seen in the left lung on series 3, image 78, stable to slightly less
conspicuous in the interval. No entirely new nodules or masses.

Upper Abdomen: No acute abnormality.

Musculoskeletal: No chest wall abnormality. No acute or significant
osseous findings.
IMPRESSION: 1. The nodule in the right lung on series 3, image 88 measures 11 x
11 x 12 mm today and on the previous study. Recommend an additional
follow-up CT scan in 3-6 months to ensure stability.
2. There is a new opacity in the posterior right base on series 3,
image 104 with associated bronchiectasis which is new in the
interval. This likely represents progression of the inflammatory or
infectious process in the right base. Recommend attention on
follow-up.
3. Increased ground-glass opacity posteriorly in the right lower
lobe on series 3, image 125 suggests an ongoing infectious or
inflammatory process.
4. Nodularity in the left lung is stable. Recommend attention on
follow-up.
5. Mild atherosclerosis in the thoracic aorta.
6. Shotty nodes in the mediastinum are stable, likely reactive.
Recommend attention on follow-up.
7.

Aortic Atherosclerosis ([id number]).

## 2022-05-21 MED FILL — Dexamethasone Sodium Phosphate Inj 100 MG/10ML: INTRAMUSCULAR | Qty: 1 | Status: AC

## 2022-05-22 ENCOUNTER — Encounter: Payer: Self-pay | Admitting: Oncology

## 2022-05-22 ENCOUNTER — Inpatient Hospital Stay (HOSPITAL_BASED_OUTPATIENT_CLINIC_OR_DEPARTMENT_OTHER): Payer: PPO | Admitting: Oncology

## 2022-05-22 ENCOUNTER — Inpatient Hospital Stay: Payer: PPO

## 2022-05-22 VITALS — HR 52 | Resp 16

## 2022-05-22 VITALS — BP 136/53 | HR 47 | Temp 97.2°F | Wt 94.4 lb

## 2022-05-22 DIAGNOSIS — Z171 Estrogen receptor negative status [ER-]: Secondary | ICD-10-CM | POA: Diagnosis not present

## 2022-05-22 DIAGNOSIS — D6481 Anemia due to antineoplastic chemotherapy: Secondary | ICD-10-CM | POA: Diagnosis not present

## 2022-05-22 DIAGNOSIS — Z2989 Encounter for other specified prophylactic measures: Secondary | ICD-10-CM

## 2022-05-22 DIAGNOSIS — C50412 Malignant neoplasm of upper-outer quadrant of left female breast: Secondary | ICD-10-CM | POA: Diagnosis not present

## 2022-05-22 DIAGNOSIS — D701 Agranulocytosis secondary to cancer chemotherapy: Secondary | ICD-10-CM

## 2022-05-22 DIAGNOSIS — T451X5A Adverse effect of antineoplastic and immunosuppressive drugs, initial encounter: Secondary | ICD-10-CM

## 2022-05-22 DIAGNOSIS — Z5111 Encounter for antineoplastic chemotherapy: Secondary | ICD-10-CM

## 2022-05-22 DIAGNOSIS — Z5112 Encounter for antineoplastic immunotherapy: Secondary | ICD-10-CM | POA: Diagnosis not present

## 2022-05-22 LAB — CBC WITH DIFFERENTIAL/PLATELET
Abs Immature Granulocytes: 0.03 10*3/uL (ref 0.00–0.07)
Basophils Absolute: 0.1 10*3/uL (ref 0.0–0.1)
Basophils Relative: 2 %
Eosinophils Absolute: 0 10*3/uL (ref 0.0–0.5)
Eosinophils Relative: 1 %
HCT: 29.6 % — ABNORMAL LOW (ref 36.0–46.0)
Hemoglobin: 9.4 g/dL — ABNORMAL LOW (ref 12.0–15.0)
Immature Granulocytes: 1 %
Lymphocytes Relative: 19 %
Lymphs Abs: 0.6 10*3/uL — ABNORMAL LOW (ref 0.7–4.0)
MCH: 32.5 pg (ref 26.0–34.0)
MCHC: 31.8 g/dL (ref 30.0–36.0)
MCV: 102.4 fL — ABNORMAL HIGH (ref 80.0–100.0)
Monocytes Absolute: 0.2 10*3/uL (ref 0.1–1.0)
Monocytes Relative: 7 %
Neutro Abs: 2.2 10*3/uL (ref 1.7–7.7)
Neutrophils Relative %: 70 %
Platelets: 224 10*3/uL (ref 150–400)
RBC: 2.89 MIL/uL — ABNORMAL LOW (ref 3.87–5.11)
RDW: 18.5 % — ABNORMAL HIGH (ref 11.5–15.5)
WBC: 3.1 10*3/uL — ABNORMAL LOW (ref 4.0–10.5)
nRBC: 0 % (ref 0.0–0.2)

## 2022-05-22 LAB — COMPREHENSIVE METABOLIC PANEL
ALT: 13 U/L (ref 0–44)
AST: 24 U/L (ref 15–41)
Albumin: 3.5 g/dL (ref 3.5–5.0)
Alkaline Phosphatase: 60 U/L (ref 38–126)
Anion gap: 6 (ref 5–15)
BUN: 10 mg/dL (ref 8–23)
CO2: 25 mmol/L (ref 22–32)
Calcium: 8.6 mg/dL — ABNORMAL LOW (ref 8.9–10.3)
Chloride: 109 mmol/L (ref 98–111)
Creatinine, Ser: 0.69 mg/dL (ref 0.44–1.00)
GFR, Estimated: >60 mL/min (ref >60)
Glucose, Bld: 92 mg/dL (ref 70–99)
Potassium: 3.8 mmol/L (ref 3.5–5.1)
Sodium: 140 mmol/L (ref 135–145)
Total Bilirubin: 0.7 mg/dL (ref 0.3–1.2)
Total Protein: 6.3 g/dL — ABNORMAL LOW (ref 6.5–8.1)

## 2022-05-22 LAB — T4, FREE: Free T4: 0.98 ng/dL (ref 0.61–1.12)

## 2022-05-22 LAB — TSH: TSH: 3.845 u[IU]/mL (ref 0.350–4.500)

## 2022-05-22 MED ORDER — SODIUM CHLORIDE 0.9 % IV SOLN
10.0000 mg | Freq: Once | INTRAVENOUS | Status: AC
  Administered 2022-05-22: 10 mg via INTRAVENOUS
  Filled 2022-05-22: qty 10

## 2022-05-22 MED ORDER — HEPARIN SOD (PORK) LOCK FLUSH 100 UNIT/ML IV SOLN
500.0000 [IU] | Freq: Once | INTRAVENOUS | Status: AC | PRN
  Administered 2022-05-22: 500 [IU]
  Filled 2022-05-22: qty 5

## 2022-05-22 MED ORDER — SODIUM CHLORIDE 0.9% FLUSH
10.0000 mL | INTRAVENOUS | Status: DC | PRN
  Administered 2022-05-22: 10 mL
  Filled 2022-05-22: qty 10

## 2022-05-22 MED ORDER — SODIUM CHLORIDE 0.9 % IV SOLN
Freq: Once | INTRAVENOUS | Status: AC
  Filled 2022-05-22: qty 250

## 2022-05-22 MED ORDER — PALONOSETRON HCL INJECTION 0.25 MG/5ML
0.2500 mg | Freq: Once | INTRAVENOUS | Status: AC
  Administered 2022-05-22: 0.25 mg via INTRAVENOUS
  Filled 2022-05-22: qty 5

## 2022-05-22 MED ORDER — FAMOTIDINE IN NACL 20-0.9 MG/50ML-% IV SOLN
20.0000 mg | Freq: Once | INTRAVENOUS | Status: AC
  Administered 2022-05-22: 20 mg via INTRAVENOUS
  Filled 2022-05-22: qty 50

## 2022-05-22 MED ORDER — SODIUM CHLORIDE 0.9 % IV SOLN
88.6500 mg | Freq: Once | INTRAVENOUS | Status: AC
  Administered 2022-05-22: 90 mg via INTRAVENOUS
  Filled 2022-05-22: qty 9

## 2022-05-22 MED ORDER — DIPHENHYDRAMINE HCL 50 MG/ML IJ SOLN
25.0000 mg | Freq: Once | INTRAMUSCULAR | Status: AC
  Administered 2022-05-22: 25 mg via INTRAVENOUS
  Filled 2022-05-22: qty 1

## 2022-05-22 MED ORDER — SODIUM CHLORIDE 0.9 % IV SOLN
65.0000 mg/m2 | Freq: Once | INTRAVENOUS | Status: AC
  Administered 2022-05-22: 90 mg via INTRAVENOUS
  Filled 2022-05-22: qty 15

## 2022-05-22 NOTE — Progress Notes (Signed)
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  Showed a irregular mass measuring 2.3 x 3.2 x 3.6 cm.  There were 2 other smaller masses in the left breast measuring 4 x 7 x 9 mm and 3 x 5 x 5 mm.  7 abnormal lymph nodes in the left axilla.  Indeterminate solid and cystic mass in the right breast in the retroareolar region measuring 1 x 1.2 x 1.9 cm as well as another 4.5 cm group of indeterminate microcalcifications in the lower right breast.  Patient had 2 left breast biopsy along with left lymph node biopsy.  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   PET CT scan showed hypermetabolic left breast mass and clusters of small but hypermetabolic left axillary and subpectoral lymph nodes.  1.2 x 0.9 cm subpleural nodule with an SUV of 1.6.   Patient is being treated as per keynote 522 regimen starting on 01/09/2022    Interval history-patient is tolerating treatments well so far  than fatigue she denies other complaints at this time.  Denies any tingling numbness in her hands and feet  ECOG PS- 1 Pain scale- 0   Review of systems- Review of Systems  Constitutional:  Positive for malaise/fatigue. Negative for chills, fever and weight loss.  HENT:  Negative for congestion, ear discharge and nosebleeds.   Eyes:  Negative for blurred vision.  Respiratory:  Negative for cough, hemoptysis, sputum production, shortness of breath and wheezing.   Cardiovascular:  Negative for chest pain, palpitations, orthopnea and claudication.  Gastrointestinal:  Negative for abdominal pain, blood in stool, constipation, diarrhea, heartburn, melena, nausea and vomiting.  Genitourinary:  Negative for dysuria, flank pain, frequency, hematuria and urgency.  Musculoskeletal:  Negative for back pain, joint pain and myalgias.  Skin:  Negative for rash.  Neurological:  Negative for dizziness, tingling, focal weakness, seizures, weakness and headaches.  Endo/Heme/Allergies:  Does not bruise/bleed easily.  Psychiatric/Behavioral:  Negative for depression and suicidal ideas. The patient does not have insomnia.       No Known Allergies   Past Medical History:  Diagnosis Date   Anemia    Anxiety    Arthritis    NECK   Cancer ([place])    BASAL CELL AND MELANOMA   DAVF (dural arteriovenous fistula) 2015   Dementia (HCC)    Family history of adverse reaction to anesthesia    niece has to come out of anesthesia slow   Hypertension    Pneumonia      Past Surgical History:  Procedure Laterality Date   BRAIN SURGERY  2015   DURAL AV FISTULA ([facility])   BREAST BIOPSY Left 12/14/2021   [place] Bx 3:00 7 CMFN, Coil Clip, Path Pending   BREAST BIOPSY Left 12/14/2021   [place] Bx 10:00 2 CMFN, Venus Clip, path pending   breast biopsy Left 12/14/2021   [place] Axille, Hydromarker (butterfly). path pending   BREAST BIOPSY Right 12/19/2021   Stereo bx-calcs, "COIL" clip-path pending   BREAST BIOPSY  Right 12/19/2021   u/s bx-mass, 3:00, retroareolar, "HEART" clip-path pending   COLONOSCOPY     COLONOSCOPY WITH PROPOFOL N/A 02/20/2017   Procedure: COLONOSCOPY WITH PROPOFOL;  Surgeon: Manya Silvas, MD;  Location: [facility] ENDOSCOPY;  Service: Endoscopy;  Laterality: N/A;   HYSTEROSCOPY WITH D & C N/A 07/29/2015   Procedure: DILATATION AND CURETTAGE /HYSTEROSCOPY;  Surgeon: Benjaman Kindler, MD;  Location: [facility] ORS;  Service: Gynecology;  Laterality: N/A;   HYSTEROSCOPY WITH D & C N/A 01/24/2018   Procedure: DILATATION AND CURETTAGE /HYSTEROSCOPY;  Surgeon: Benjaman Kindler, MD;  Location: [facility] ORS;  Service: Gynecology;  Laterality: N/A;   LAPAROSCOPY N/A 01/24/2018   Procedure: LAPAROSCOPY DIAGNOSTIC;  Surgeon: Benjaman Kindler, MD;  Location: [facility] ORS;  Service: Gynecology;  Laterality: N/A;   PERIPHERAL VASCULAR THROMBECTOMY N/A 02/29/2020   Procedure: PERIPHERAL VASCULAR THROMBECTOMY / THROMBOLYSIS WITH POSSIBLE PULMONARY THROMBECTOMY / THROMBOLYSIS;  Surgeon: Algernon Huxley, MD;  Location: [facility];  Service: Cardiovascular;  Laterality: N/A;   PORTACATH PLACEMENT N/A 01/08/2022   Procedure: INSERTION PORT-A-CATH;  Surgeon: Herbert Pun, MD;  Location: [facility] ORS;  Service: General;  Laterality: N/A;   TONSILLECTOMY     AGE 46   TOTAL LAPAROSCOPIC HYSTERECTOMY WITH BILATERAL SALPINGO OOPHORECTOMY Bilateral 12/28/2019   Procedure: TOTAL LAPAROSCOPIC HYSTERECTOMY WITH BILATERAL SALPINGO OOPHORECTOMY;  Surgeon: Benjaman Kindler, MD;  Location: [facility] ORS;  Service: Gynecology;  Laterality: Bilateral;   TUBAL LIGATION      Social History   Socioeconomic History   Marital status: Divorced    Spouse name: Not on file   Number of children: Not on file   Years of education: Not on file   Highest education level: Not on file  Occupational History   Not on file  Tobacco Use   Smoking status: Former    Packs/day: 0.50    Years: 15.00    Total pack years: 7.50    Types:  Cigarettes    Quit date: 07/21/2000    Years since quitting: 21.8   Smokeless tobacco: Never  Vaping Use   Vaping Use: Never used  Substance and Sexual Activity   Alcohol use: Not Currently   Drug use: No   Sexual activity: Not Currently  Other Topics Concern   Not on file  Social History Narrative   Lives alone, caregiver 29 year old daughter   Social Determinants of Health   Financial Resource Strain: Not on file  Food Insecurity: Not on file  Transportation Needs: Not on file  Physical Activity: Not on file  Stress: Not on file  Social Connections: Not on file  Intimate Partner Violence: Not on file    Family History  Problem Relation Age of Onset   Stomach cancer Sister    Hypertension Sister    Hypertension Sister    Diabetes Niece    Hypertension Niece    Breast cancer Neg Hx      Current Outpatient Medications:    benzonatate (TESSALON) 200 MG capsule, Take 1 capsule (200 mg  total) by mouth 3 (three) times daily as needed for cough. (Patient not taking: Reported on 04/24/2022), Disp: 60 capsule, Rfl: 0   buPROPion (WELLBUTRIN XL) 150 MG 24 hr tablet, Take 150 mg by mouth every morning., Disp: , Rfl: 6   cetirizine (ZYRTEC) 10 MG tablet, Take 10 mg by mouth daily as needed for allergies., Disp: , Rfl:    dexamethasone (DECADRON) 4 MG tablet, Take 2 tablets (8 mg total) by mouth daily. Start the day after chemotherapy for 2 days. (Patient not taking: Reported on 05/08/2022), Disp: 30 tablet, Rfl: 1   DULoxetine (CYMBALTA) 30 MG capsule, Take 1 capsule (30 mg total) by mouth daily for 14 days, THEN 2 capsules (60 mg total) daily for 14 days. (Patient not taking: Reported on 04/24/2022), Disp: 42 capsule, Rfl: 0   lidocaine-prilocaine (EMLA) cream, Apply 1 Application topically as needed., Disp: , Rfl:    LORazepam (ATIVAN) 0.5 MG tablet, Take 1 tablet (0.5 mg total) by mouth every 6 (six) hours as needed (Nausea or vomiting). (Patient not taking: Reported on  05/08/2022), Disp: 30 tablet, Rfl: 0   ondansetron (ZOFRAN) 8 MG tablet, Take 1 tablet (8 mg total) by mouth every 8 (eight) hours as needed for nausea or vomiting. (Patient not taking: Reported on 05/08/2022), Disp: 30 tablet, Rfl: 2   polyethylene glycol (MIRALAX / GLYCOLAX) 17 g packet, Take 17 g by mouth daily. (Patient not taking: Reported on 04/03/2022), Disp: , Rfl:    prochlorperazine (COMPAZINE) 10 MG tablet, Take 1 tablet (10 mg total) by mouth every 6 (six) hours as needed (Nausea or vomiting). (Patient not taking: Reported on 05/08/2022), Disp: 30 tablet, Rfl: 1 No current facility-administered medications for this visit.  Facility-Administered Medications Ordered in Other Visits:    CARBOplatin (PARAPLATIN) 90 mg in sodium chloride 0.9 % 100 mL chemo infusion, 90 mg, Intravenous, Once, Sindy Guadeloupe, MD   dexamethasone (DECADRON) 10 mg in sodium chloride 0.9 % 50 mL IVPB, 10 mg, Intravenous, Once, Sindy Guadeloupe, MD   famotidine (PEPCID) IVPB 20 mg premix, 20 mg, Intravenous, Once, Sindy Guadeloupe, MD, Last Rate: 200 mL/hr at 05/22/22 1015, 20 mg at 05/22/22 1015   heparin lock flush 100 unit/mL, 500 Units, Intracatheter, Once PRN, Sindy Guadeloupe, MD   PACLitaxel (TAXOL) 90 mg in sodium chloride 0.9 % 250 mL chemo infusion (</= 55m/m2), 65 mg/m2 (Treatment Plan Recorded), Intravenous, Once, RSindy Guadeloupe MD   sodium chloride flush (NS) 0.9 % injection 10 mL, 10 mL, Intracatheter, PRN, RSindy Guadeloupe MD  Physical exam:  Vitals:   05/22/22 0922  BP: (!) 136/53  Pulse: (!) 47  Temp: (!) 97.2 F (36.2 C)  TempSrc: Tympanic  Weight: 94 lb 6.4 oz (42.8 kg)   Physical Exam Cardiovascular:     Rate and Rhythm: Normal rate and regular rhythm.     Heart sounds: Normal heart sounds.  Pulmonary:     Effort: Pulmonary effort is normal.     Breath sounds: Normal breath sounds.  Skin:    General: Skin is warm and dry.  Neurological:     Mental Status: She is alert and oriented to  person, place, and time.         Latest Ref Rng & Units 05/22/2022    9:06 AM  CMP  Glucose 70 - 99 mg/dL 92   BUN 8 - 23 mg/dL 10   Creatinine 0.44 - 1.00 mg/dL 0.69   Sodium 135 - 145 mmol/L  140   Potassium 3.5 - 5.1 mmol/L 3.8   Chloride 98 - 111 mmol/L 109   CO2 22 - 32 mmol/L 25   Calcium 8.9 - 10.3 mg/dL 8.6   Total Protein 6.5 - 8.1 g/dL 6.3   Total Bilirubin 0.3 - 1.2 mg/dL 0.7   Alkaline Phos 38 - 126 U/L 60   AST 15 - 41 U/L 24   ALT 0 - 44 U/L 13       Latest Ref Rng & Units 05/22/2022    9:06 AM  CBC  WBC 4.0 - 10.5 K/uL 3.1   Hemoglobin 12.0 - 15.0 g/dL 9.4   Hematocrit 36.0 - 46.0 % 29.6   Platelets 150 - 400 K/uL 224      Assessment and plan- Patient is a 76 y.o. 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  She is here for on treatment assessment prior to cycle 3-day 8of weekly CarboTaxol  chemotherapy.   Counts okay to proceed with cycle 3-day 8 of weekly CarboTaxol chemotherapy today.  She will directly proceed for cycle 3-day 15 next week and I will see her back in 2 weeks for cycle 4-day 1 of CarboTaxol Keytruda chemotherapy.  Patient also receives 3 days of Neupogen for chemo induced neutropenia after day 15 of each cycle.  Chemo-induced anemia: Will check ferritin and iron studies B12 and folate with next treatment  Patient has an appointment with Dr. Peyton Najjar to discuss surgical options after she completes neoadjuvant chemotherapy.  I will also plan to get repeat staging scans before she goes for definitive surgery   Visit Diagnosis 1. Encounter for antineoplastic chemotherapy   2. Malignant neoplasm of upper-outer quadrant of left breast in female, estrogen receptor negative ([place])   3. Antineoplastic chemotherapy induced anemia   4. Chemotherapy induced neutropenia (HCC)      Dr. Randa Evens, MD, MPH [facility] [phone] 05/22/2022 10:17 AM

## 2022-05-22 NOTE — Patient Instructions (Signed)
[facility] [place]  Discharge Instructions: Thank you for choosing [facility] to provide your oncology and hematology care.  If you have a lab appointment with the Cancer Center, please go directly to the Cancer Center and check in at the registration area.  Wear comfortable clothing and clothing appropriate for easy access to any Portacath or PICC line.   We strive to give you quality time with your provider. You may need to reschedule your appointment if you arrive late (15 or more minutes).  Arriving late affects you and other patients whose appointments are after yours.  Also, if you miss three or more appointments without notifying the office, you may be dismissed from the clinic at the provider's discretion.      For prescription refill requests, have your pharmacy contact our office and allow 72 hours for refills to be completed.    Today you received the following chemotherapy and/or immunotherapy agents Taxol and Carboplatin       To help prevent nausea and vomiting after your treatment, we encourage you to take your nausea medication as directed.  BELOW ARE SYMPTOMS THAT SHOULD BE REPORTED IMMEDIATELY: *FEVER GREATER THAN 100.4 F (38 C) OR HIGHER *CHILLS OR SWEATING *NAUSEA AND VOMITING THAT IS NOT CONTROLLED WITH YOUR NAUSEA MEDICATION *UNUSUAL SHORTNESS OF BREATH *UNUSUAL BRUISING OR BLEEDING *URINARY PROBLEMS (pain or burning when urinating, or frequent urination) *BOWEL PROBLEMS (unusual diarrhea, constipation, pain near the anus) TENDERNESS IN MOUTH AND THROAT WITH OR WITHOUT PRESENCE OF ULCERS (sore throat, sores in mouth, or a toothache) UNUSUAL RASH, SWELLING OR PAIN  UNUSUAL VAGINAL DISCHARGE OR ITCHING   Items with * indicate a potential emergency and should be followed up as soon as possible or go to the Emergency Department if any problems should occur.  Please show the CHEMOTHERAPY ALERT CARD or IMMUNOTHERAPY ALERT CARD at  check-in to the Emergency Department and triage nurse.  Should you have questions after your visit or need to cancel or reschedule your appointment, please contact [facility]  [phone] and follow the prompts.  Office hours are 8:00 a.m. to 4:30 p.m. Monday - Friday. Please note that voicemails left after 4:00 p.m. may not be returned until the following business day.  We are closed weekends and major holidays. You have access to a nurse at all times for urgent questions. Please call the main number to the clinic [phone] and follow the prompts.  For any non-urgent questions, you may also contact your provider using MyChart. We now offer e-Visits for anyone 18 and older to request care online for non-urgent symptoms. For details visit [email or web address].   Also download the MyChart app! Go to the app store, search "MyChart", open the app, select [place], and log in with your MyChart username and password.  Masks are optional in the cancer centers. If you would like for your care team to wear a mask while they are taking care of you, please let them know. For doctor visits, patients may have with them one support person who is at least 78 years old. At this time, visitors are not allowed in the infusion area.  

## 2022-05-28 MED FILL — Dexamethasone Sodium Phosphate Inj 100 MG/10ML: INTRAMUSCULAR | Qty: 1 | Status: AC

## 2022-05-29 ENCOUNTER — Encounter: Payer: Self-pay | Admitting: Oncology

## 2022-05-29 ENCOUNTER — Inpatient Hospital Stay: Payer: PPO

## 2022-05-29 ENCOUNTER — Inpatient Hospital Stay (HOSPITAL_BASED_OUTPATIENT_CLINIC_OR_DEPARTMENT_OTHER): Payer: PPO | Admitting: Oncology

## 2022-05-29 VITALS — BP 128/66 | HR 50 | Temp 97.7°F | Resp 16 | Wt 94.0 lb

## 2022-05-29 DIAGNOSIS — Z171 Estrogen receptor negative status [ER-]: Secondary | ICD-10-CM

## 2022-05-29 DIAGNOSIS — C50412 Malignant neoplasm of upper-outer quadrant of left female breast: Secondary | ICD-10-CM | POA: Diagnosis not present

## 2022-05-29 DIAGNOSIS — Z5112 Encounter for antineoplastic immunotherapy: Secondary | ICD-10-CM | POA: Diagnosis not present

## 2022-05-29 DIAGNOSIS — T451X5A Adverse effect of antineoplastic and immunosuppressive drugs, initial encounter: Secondary | ICD-10-CM

## 2022-05-29 DIAGNOSIS — D701 Agranulocytosis secondary to cancer chemotherapy: Secondary | ICD-10-CM

## 2022-05-29 DIAGNOSIS — Z5111 Encounter for antineoplastic chemotherapy: Secondary | ICD-10-CM

## 2022-05-29 DIAGNOSIS — Z2989 Encounter for other specified prophylactic measures: Secondary | ICD-10-CM

## 2022-05-29 LAB — IRON AND TIBC
Iron: 30 ug/dL (ref 28–170)
Saturation Ratios: 10 % — ABNORMAL LOW (ref 10.4–31.8)
TIBC: 304 ug/dL (ref 250–450)
UIBC: 274 ug/dL

## 2022-05-29 LAB — COMPREHENSIVE METABOLIC PANEL
ALT: 15 U/L (ref 0–44)
AST: 22 U/L (ref 15–41)
Albumin: 3.6 g/dL (ref 3.5–5.0)
Alkaline Phosphatase: 58 U/L (ref 38–126)
Anion gap: 6 (ref 5–15)
BUN: 10 mg/dL (ref 8–23)
CO2: 25 mmol/L (ref 22–32)
Calcium: 8.9 mg/dL (ref 8.9–10.3)
Chloride: 110 mmol/L (ref 98–111)
Creatinine, Ser: 0.75 mg/dL (ref 0.44–1.00)
GFR, Estimated: >60 mL/min (ref >60)
Glucose, Bld: 91 mg/dL (ref 70–99)
Potassium: 4.2 mmol/L (ref 3.5–5.1)
Sodium: 141 mmol/L (ref 135–145)
Total Bilirubin: 0.8 mg/dL (ref 0.3–1.2)
Total Protein: 6.3 g/dL — ABNORMAL LOW (ref 6.5–8.1)

## 2022-05-29 LAB — CBC WITH DIFFERENTIAL/PLATELET
Abs Immature Granulocytes: 0.03 10*3/uL (ref 0.00–0.07)
Basophils Absolute: 0.1 10*3/uL (ref 0.0–0.1)
Basophils Relative: 1 %
Eosinophils Absolute: 0 10*3/uL (ref 0.0–0.5)
Eosinophils Relative: 1 %
HCT: 29.8 % — ABNORMAL LOW (ref 36.0–46.0)
Hemoglobin: 9.7 g/dL — ABNORMAL LOW (ref 12.0–15.0)
Immature Granulocytes: 1 %
Lymphocytes Relative: 18 %
Lymphs Abs: 0.7 10*3/uL (ref 0.7–4.0)
MCH: 33.1 pg (ref 26.0–34.0)
MCHC: 32.6 g/dL (ref 30.0–36.0)
MCV: 101.7 fL — ABNORMAL HIGH (ref 80.0–100.0)
Monocytes Absolute: 0.4 10*3/uL (ref 0.1–1.0)
Monocytes Relative: 11 %
Neutro Abs: 2.4 10*3/uL (ref 1.7–7.7)
Neutrophils Relative %: 68 %
Platelets: 210 10*3/uL (ref 150–400)
RBC: 2.93 MIL/uL — ABNORMAL LOW (ref 3.87–5.11)
RDW: 18.6 % — ABNORMAL HIGH (ref 11.5–15.5)
WBC: 3.6 10*3/uL — ABNORMAL LOW (ref 4.0–10.5)
nRBC: 0 % (ref 0.0–0.2)

## 2022-05-29 LAB — FERRITIN: Ferritin: 283 ng/mL (ref 11–307)

## 2022-05-29 LAB — FOLATE: Folate: 13.9 ng/mL (ref >5.9)

## 2022-05-29 LAB — TSH: TSH: 3.729 u[IU]/mL (ref 0.350–4.500)

## 2022-05-29 LAB — T4, FREE: Free T4: 0.83 ng/dL (ref 0.61–1.12)

## 2022-05-29 MED ORDER — DIPHENHYDRAMINE HCL 50 MG/ML IJ SOLN
25.0000 mg | Freq: Once | INTRAMUSCULAR | Status: AC
  Administered 2022-05-29: 25 mg via INTRAVENOUS
  Filled 2022-05-29: qty 1

## 2022-05-29 MED ORDER — PALONOSETRON HCL INJECTION 0.25 MG/5ML
0.2500 mg | Freq: Once | INTRAVENOUS | Status: AC
  Administered 2022-05-29: 0.25 mg via INTRAVENOUS
  Filled 2022-05-29: qty 5

## 2022-05-29 MED ORDER — FAMOTIDINE IN NACL 20-0.9 MG/50ML-% IV SOLN
20.0000 mg | Freq: Once | INTRAVENOUS | Status: AC
  Administered 2022-05-29: 20 mg via INTRAVENOUS
  Filled 2022-05-29: qty 50

## 2022-05-29 MED ORDER — HEPARIN SOD (PORK) LOCK FLUSH 100 UNIT/ML IV SOLN
500.0000 [IU] | Freq: Once | INTRAVENOUS | Status: AC | PRN
  Administered 2022-05-29: 500 [IU]
  Filled 2022-05-29: qty 5

## 2022-05-29 MED ORDER — SODIUM CHLORIDE 0.9 % IV SOLN
Freq: Once | INTRAVENOUS | Status: AC
  Filled 2022-05-29: qty 250

## 2022-05-29 MED ORDER — SODIUM CHLORIDE 0.9 % IV SOLN
10.0000 mg | Freq: Once | INTRAVENOUS | Status: AC
  Administered 2022-05-29: 10 mg via INTRAVENOUS
  Filled 2022-05-29: qty 10

## 2022-05-29 MED ORDER — SODIUM CHLORIDE 0.9 % IV SOLN
65.0000 mg/m2 | Freq: Once | INTRAVENOUS | Status: AC
  Administered 2022-05-29: 90 mg via INTRAVENOUS
  Filled 2022-05-29: qty 15

## 2022-05-29 MED ORDER — SODIUM CHLORIDE 0.9 % IV SOLN
88.6500 mg | Freq: Once | INTRAVENOUS | Status: AC
  Administered 2022-05-29: 90 mg via INTRAVENOUS
  Filled 2022-05-29: qty 9

## 2022-05-29 NOTE — Progress Notes (Signed)
Pt in for follow up, denies any concerns today. 

## 2022-05-29 NOTE — Progress Notes (Signed)
Nutrition Assessment   Reason for Assessment:   Patient identified on Malnutrition Screening report for weight loss and poor appetite   ASSESSMENT:  76 year old female with left breast cancer.  Patient receiving carboplatin and taxol.  Past medical history of HTN, anemia, dementia.    Met with patient during infusion.  Niece came with her but has stepped away during infusion.  Patient reports good appetite but she admits she can do better and eat more.  Patient lives with her special needs daughter.  Niece brings her food but she says that she can prepare foods and buys groceries when needed.  Says that she usually eats a muffin for breakfast and tea.  Lunch maybe fruit, yesterday it was field peas that niece prepared.  Says that she drinking 1 ensure per day.  Denies nausea or bowel issues.    Medications: decadron   Labs: reviewed   Anthropometrics:   Height: 4'11" Weight: 94 lb 102 lb 01/30/22 BMI: 18  8% weight loss in the last 4 months   Estimated Energy Needs  Kcals: 1300-1500 Protein: 65-75 g Fluid: 1300-1500 ml   NUTRITION DIAGNOSIS: Unintentional weight loss related to cancer, ?? Dementia as evidenced by 8% weight loss in the last 4 months   INTERVENTION:  Discussed ways to add calories and protein in diet.  High Calorie, High Protein Diet handout provided Encouraged 350 calorie ensure/equivalent and wrote these examples down for patient.  Coupons given. Contact information given   MONITORING, EVALUATION, GOAL: weight trends, intake   Next Visit: Tuesday, Jan 9th during infusion  Colvin Blatt B. Zenia Resides, [place], [place] Registered Dietitian [phone]

## 2022-05-29 NOTE — Progress Notes (Signed)
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  Showed a irregular mass measuring 2.3 x 3.2 x 3.6 cm.  There were 2 other smaller masses in the left breast measuring 4 x 7 x 9 mm and 3 x 5 x 5 mm.  7 abnormal lymph nodes in the left axilla.  Indeterminate solid and cystic mass in the right breast in the retroareolar region measuring 1 x 1.2 x 1.9 cm as well as another 4.5 cm group of indeterminate microcalcifications in the lower right breast.  Patient had 2 left breast biopsy along with left lymph node biopsy.  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   PET CT scan showed hypermetabolic left breast mass and clusters of small but hypermetabolic left axillary and subpectoral lymph nodes.  1.2 x 0.9 cm subpleural nodule with an SUV of 1.6.   Patient is being treated as per keynote 522 regimen starting on 01/09/2022    Interval history-tolerating treatments well so far.  Appetite  and weight have remained stable.  Denies any specific complaints at this time.  Denies any symptoms of peripheral neuropathy.  ECOG PS- 1 Pain scale- 0   Review of systems- Review of Systems  Constitutional:  Negative for chills, fever, malaise/fatigue and weight loss.  HENT:  Negative for congestion, ear discharge and nosebleeds.   Eyes:  Negative for blurred vision.  Respiratory:  Negative for cough, hemoptysis, sputum production, shortness of breath and wheezing.   Cardiovascular:  Negative for chest pain, palpitations, orthopnea and claudication.  Gastrointestinal:  Negative for abdominal pain, blood in stool, constipation, diarrhea, heartburn, melena, nausea and vomiting.  Genitourinary:  Negative for dysuria, flank pain, frequency, hematuria and urgency.  Musculoskeletal:  Negative for back pain, joint pain and myalgias.  Skin:  Negative for rash.  Neurological:  Negative for dizziness, tingling, focal weakness, seizures, weakness and headaches.  Endo/Heme/Allergies:  Does not bruise/bleed easily.  Psychiatric/Behavioral:  Negative for depression and suicidal ideas. The patient does not have insomnia.       No Known Allergies   Past Medical History:  Diagnosis Date   Anemia    Anxiety    Arthritis    NECK   Cancer ([place])    BASAL CELL AND MELANOMA   DAVF (dural arteriovenous fistula) 2015   Dementia (HCC)    Family history of adverse reaction to anesthesia    niece has to come out of anesthesia slow   Hypertension    Pneumonia      Past Surgical History:  Procedure Laterality Date   BRAIN SURGERY  2015   DURAL AV FISTULA ([place] [facility])   BREAST BIOPSY Left 12/14/2021   [place] Bx 3:00 7 CMFN, Coil Clip, Path Pending   BREAST BIOPSY Left 12/14/2021   [place] Bx 10:00 2 CMFN, Venus Clip, path pending   breast biopsy Left 12/14/2021   [place] Axille, Hydromarker (butterfly). path pending   BREAST BIOPSY Right 12/19/2021   Stereo bx-calcs, "COIL" clip-path pending   BREAST  BIOPSY Right 12/19/2021   u/s bx-mass, 3:00, retroareolar, "HEART" clip-path pending   COLONOSCOPY     COLONOSCOPY WITH PROPOFOL N/A 02/20/2017   Procedure: COLONOSCOPY WITH PROPOFOL;  Surgeon: Manya Silvas, MD;  Location: [facility] ENDOSCOPY;  Service: Endoscopy;  Laterality: N/A;   HYSTEROSCOPY WITH D & C N/A 07/29/2015   Procedure: DILATATION AND CURETTAGE /HYSTEROSCOPY;  Surgeon: Benjaman Kindler, MD;  Location: [facility] ORS;  Service: Gynecology;  Laterality: N/A;   HYSTEROSCOPY WITH D & C N/A 01/24/2018   Procedure: DILATATION AND CURETTAGE /HYSTEROSCOPY;  Surgeon: Benjaman Kindler, MD;  Location: [facility] ORS;  Service: Gynecology;  Laterality: N/A;   LAPAROSCOPY N/A 01/24/2018   Procedure: LAPAROSCOPY DIAGNOSTIC;  Surgeon: Benjaman Kindler, MD;  Location: [facility] ORS;  Service: Gynecology;  Laterality: N/A;   PERIPHERAL VASCULAR THROMBECTOMY N/A 02/29/2020   Procedure: PERIPHERAL VASCULAR THROMBECTOMY / THROMBOLYSIS WITH POSSIBLE PULMONARY THROMBECTOMY / THROMBOLYSIS;  Surgeon: Algernon Huxley, MD;  Location: [place] [facility];  Service: Cardiovascular;  Laterality: N/A;   PORTACATH PLACEMENT N/A 01/08/2022   Procedure: INSERTION PORT-A-CATH;  Surgeon: Herbert Pun, MD;  Location: [facility] ORS;  Service: General;  Laterality: N/A;   TONSILLECTOMY     AGE 58   TOTAL LAPAROSCOPIC HYSTERECTOMY WITH BILATERAL SALPINGO OOPHORECTOMY Bilateral 12/28/2019   Procedure: TOTAL LAPAROSCOPIC HYSTERECTOMY WITH BILATERAL SALPINGO OOPHORECTOMY;  Surgeon: Benjaman Kindler, MD;  Location: [facility] ORS;  Service: Gynecology;  Laterality: Bilateral;   TUBAL LIGATION      Social History   Socioeconomic History   Marital status: Divorced    Spouse name: Not on file   Number of children: Not on file   Years of education: Not on file   Highest education level: Not on file  Occupational History   Not on file  Tobacco Use   Smoking status: Former    Packs/day: 0.50    Years: 15.00    Total pack years: 7.50     Types: Cigarettes    Quit date: 07/21/2000    Years since quitting: 21.8   Smokeless tobacco: Never  Vaping Use   Vaping Use: Never used  Substance and Sexual Activity   Alcohol use: Not Currently   Drug use: No   Sexual activity: Not Currently  Other Topics Concern   Not on file  Social History Narrative   Lives alone, caregiver 25 year old daughter   Social Determinants of Health   Financial Resource Strain: Not on file  Food Insecurity: Not on file  Transportation Needs: Not on file  Physical Activity: Not on file  Stress: Not on file  Social Connections: Not on file  Intimate Partner Violence: Not on file    Family History  Problem Relation Age of Onset   Stomach cancer Sister    Hypertension Sister    Hypertension Sister    Diabetes Niece    Hypertension Niece    Breast cancer Neg Hx      Current Outpatient Medications:    buPROPion (WELLBUTRIN XL) 150 MG 24 hr tablet, Take 150  mg by mouth every morning., Disp: , Rfl: 6   cetirizine (ZYRTEC) 10 MG tablet, Take 10 mg by mouth daily as needed for allergies., Disp: , Rfl:    dexamethasone (DECADRON) 4 MG tablet, Take 2 tablets (8 mg total) by mouth daily. Start the day after chemotherapy for 2 days., Disp: 30 tablet, Rfl: 1   lidocaine-prilocaine (EMLA) cream, Apply 1 Application topically as needed., Disp: , Rfl:    benzonatate (TESSALON) 200 MG capsule, Take 1 capsule (200 mg total) by mouth 3 (three) times daily as needed for cough. (Patient not taking: Reported on 04/24/2022), Disp: 60 capsule, Rfl: 0   LORazepam (ATIVAN) 0.5 MG tablet, Take 1 tablet (0.5 mg total) by mouth every 6 (six) hours as needed (Nausea or vomiting). (Patient not taking: Reported on 05/08/2022), Disp: 30 tablet, Rfl: 0   ondansetron (ZOFRAN) 8 MG tablet, Take 1 tablet (8 mg total) by mouth every 8 (eight) hours as needed for nausea or vomiting. (Patient not taking: Reported on 05/08/2022), Disp: 30 tablet, Rfl: 2   polyethylene glycol  (MIRALAX / GLYCOLAX) 17 g packet, Take 17 g by mouth daily. (Patient not taking: Reported on 04/03/2022), Disp: , Rfl:    prochlorperazine (COMPAZINE) 10 MG tablet, Take 1 tablet (10 mg total) by mouth every 6 (six) hours as needed (Nausea or vomiting). (Patient not taking: Reported on 05/08/2022), Disp: 30 tablet, Rfl: 1  Physical exam: There were no vitals filed for this visit. Physical Exam Cardiovascular:     Rate and Rhythm: Normal rate and regular rhythm.     Heart sounds: Normal heart sounds.  Pulmonary:     Effort: Pulmonary effort is normal.     Breath sounds: Normal breath sounds.  Abdominal:     General: Bowel sounds are normal.     Palpations: Abdomen is soft.  Skin:    General: Skin is warm and dry.  Neurological:     Mental Status: She is alert and oriented to person, place, and time.         Latest Ref Rng & Units 05/22/2022    9:06 AM  CMP  Glucose 70 - 99 mg/dL 92   BUN 8 - 23 mg/dL 10   Creatinine 0.44 - 1.00 mg/dL 0.69   Sodium 135 - 145 mmol/L 140   Potassium 3.5 - 5.1 mmol/L 3.8   Chloride 98 - 111 mmol/L 109   CO2 22 - 32 mmol/L 25   Calcium 8.9 - 10.3 mg/dL 8.6   Total Protein 6.5 - 8.1 g/dL 6.3   Total Bilirubin 0.3 - 1.2 mg/dL 0.7   Alkaline Phos 38 - 126 U/L 60   AST 15 - 41 U/L 24   ALT 0 - 44 U/L 13       Latest Ref Rng & Units 05/29/2022    8:54 AM  CBC  WBC 4.0 - 10.5 K/uL 3.6   Hemoglobin 12.0 - 15.0 g/dL 9.7   Hematocrit 36.0 - 46.0 % 29.8   Platelets 150 - 400 K/uL 210      Assessment and plan- Patient is a 76 y.o. 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  She is here for on treatment assessment prior to cycle 3-day 15 of weekly CarboTaxol chemotherapy  Counts okay to proceed with cycle 3-day 15 of weekly CarboTaxol chemotherapy today.  She will be receiving Neupogen for 3 days starting tomorrow.  I will see her back in 1  week for cycle 4-day 1 of CarboTaxol Keytruda chemotherapy  Chemo-induced anemia: Ferritin and iron studies B12 and folate to be checked today.  Chemo-induced neutropenia: Plan for Neupogen as above   Visit Diagnosis 1. Encounter for antineoplastic chemotherapy   2. Malignant neoplasm of upper-outer quadrant of left breast in female, estrogen receptor negative ([place])   3. Chemotherapy induced neutropenia (HCC)      Dr. Randa Evens, MD, MPH [facility] [phone] 05/29/2022 9:12 AM

## 2022-05-30 ENCOUNTER — Inpatient Hospital Stay: Payer: PPO

## 2022-05-30 DIAGNOSIS — Z171 Estrogen receptor negative status [ER-]: Secondary | ICD-10-CM

## 2022-05-30 DIAGNOSIS — Z5112 Encounter for antineoplastic immunotherapy: Secondary | ICD-10-CM | POA: Diagnosis not present

## 2022-05-30 LAB — VITAMIN B12: Vitamin B-12: 1261 pg/mL — ABNORMAL HIGH (ref 180–914)

## 2022-05-30 MED ORDER — FILGRASTIM-AAFI 480 MCG/0.8ML IJ SOSY
480.0000 ug | PREFILLED_SYRINGE | Freq: Once | INTRAMUSCULAR | Status: AC
  Administered 2022-05-30: 480 ug via SUBCUTANEOUS
  Filled 2022-05-30: qty 0.8

## 2022-05-31 ENCOUNTER — Inpatient Hospital Stay: Payer: PPO

## 2022-05-31 DIAGNOSIS — Z5112 Encounter for antineoplastic immunotherapy: Secondary | ICD-10-CM | POA: Diagnosis not present

## 2022-05-31 DIAGNOSIS — Z171 Estrogen receptor negative status [ER-]: Secondary | ICD-10-CM

## 2022-05-31 MED ORDER — FILGRASTIM-AAFI 480 MCG/0.8ML IJ SOSY
480.0000 ug | PREFILLED_SYRINGE | Freq: Once | INTRAMUSCULAR | Status: AC
  Administered 2022-05-31: 480 ug via SUBCUTANEOUS
  Filled 2022-05-31: qty 0.8

## 2022-06-01 ENCOUNTER — Inpatient Hospital Stay: Payer: PPO

## 2022-06-01 DIAGNOSIS — Z171 Estrogen receptor negative status [ER-]: Secondary | ICD-10-CM

## 2022-06-01 DIAGNOSIS — Z5112 Encounter for antineoplastic immunotherapy: Secondary | ICD-10-CM | POA: Diagnosis not present

## 2022-06-01 MED ORDER — FILGRASTIM-AAFI 480 MCG/0.8ML IJ SOSY
480.0000 ug | PREFILLED_SYRINGE | Freq: Once | INTRAMUSCULAR | Status: AC
  Administered 2022-06-01: 480 ug via SUBCUTANEOUS
  Filled 2022-06-01: qty 0.8

## 2022-06-01 MED FILL — Dexamethasone Sodium Phosphate Inj 100 MG/10ML: INTRAMUSCULAR | Qty: 1 | Status: AC

## 2022-06-05 ENCOUNTER — Inpatient Hospital Stay (HOSPITAL_BASED_OUTPATIENT_CLINIC_OR_DEPARTMENT_OTHER): Payer: PPO | Admitting: Oncology

## 2022-06-05 ENCOUNTER — Inpatient Hospital Stay: Payer: PPO

## 2022-06-05 ENCOUNTER — Encounter: Payer: Self-pay | Admitting: Oncology

## 2022-06-05 VITALS — BP 103/50 | HR 93 | Temp 96.5°F | Resp 20 | Wt 95.1 lb

## 2022-06-05 DIAGNOSIS — Z171 Estrogen receptor negative status [ER-]: Secondary | ICD-10-CM

## 2022-06-05 DIAGNOSIS — C50412 Malignant neoplasm of upper-outer quadrant of left female breast: Secondary | ICD-10-CM

## 2022-06-05 DIAGNOSIS — Z5111 Encounter for antineoplastic chemotherapy: Secondary | ICD-10-CM | POA: Diagnosis not present

## 2022-06-05 DIAGNOSIS — Z5112 Encounter for antineoplastic immunotherapy: Secondary | ICD-10-CM | POA: Diagnosis not present

## 2022-06-05 LAB — COMPREHENSIVE METABOLIC PANEL
ALT: 13 U/L (ref 0–44)
AST: 22 U/L (ref 15–41)
Albumin: 3.5 g/dL (ref 3.5–5.0)
Alkaline Phosphatase: 77 U/L (ref 38–126)
Anion gap: 4 — ABNORMAL LOW (ref 5–15)
BUN: 11 mg/dL (ref 8–23)
CO2: 25 mmol/L (ref 22–32)
Calcium: 8.8 mg/dL — ABNORMAL LOW (ref 8.9–10.3)
Chloride: 109 mmol/L (ref 98–111)
Creatinine, Ser: 0.73 mg/dL (ref 0.44–1.00)
GFR, Estimated: >60 mL/min (ref >60)
Glucose, Bld: 128 mg/dL — ABNORMAL HIGH (ref 70–99)
Potassium: 3.8 mmol/L (ref 3.5–5.1)
Sodium: 138 mmol/L (ref 135–145)
Total Bilirubin: 0.7 mg/dL (ref 0.3–1.2)
Total Protein: 6.1 g/dL — ABNORMAL LOW (ref 6.5–8.1)

## 2022-06-05 LAB — CBC WITH DIFFERENTIAL/PLATELET
Abs Immature Granulocytes: 0.13 10*3/uL — ABNORMAL HIGH (ref 0.00–0.07)
Basophils Absolute: 0.1 10*3/uL (ref 0.0–0.1)
Basophils Relative: 1 %
Eosinophils Absolute: 0 10*3/uL (ref 0.0–0.5)
Eosinophils Relative: 1 %
HCT: 31.4 % — ABNORMAL LOW (ref 36.0–46.0)
Hemoglobin: 10.2 g/dL — ABNORMAL LOW (ref 12.0–15.0)
Immature Granulocytes: 2 %
Lymphocytes Relative: 12 %
Lymphs Abs: 0.7 10*3/uL (ref 0.7–4.0)
MCH: 33.2 pg (ref 26.0–34.0)
MCHC: 32.5 g/dL (ref 30.0–36.0)
MCV: 102.3 fL — ABNORMAL HIGH (ref 80.0–100.0)
Monocytes Absolute: 0.8 10*3/uL (ref 0.1–1.0)
Monocytes Relative: 13 %
Neutro Abs: 4 10*3/uL (ref 1.7–7.7)
Neutrophils Relative %: 71 %
Platelets: 180 10*3/uL (ref 150–400)
RBC: 3.07 MIL/uL — ABNORMAL LOW (ref 3.87–5.11)
RDW: 19 % — ABNORMAL HIGH (ref 11.5–15.5)
WBC: 5.7 10*3/uL (ref 4.0–10.5)
nRBC: 0 % (ref 0.0–0.2)

## 2022-06-05 MED ORDER — PALONOSETRON HCL INJECTION 0.25 MG/5ML
0.2500 mg | Freq: Once | INTRAVENOUS | Status: AC
  Administered 2022-06-05: 0.25 mg via INTRAVENOUS
  Filled 2022-06-05: qty 5

## 2022-06-05 MED ORDER — DIPHENHYDRAMINE HCL 50 MG/ML IJ SOLN
25.0000 mg | Freq: Once | INTRAMUSCULAR | Status: AC
  Administered 2022-06-05: 25 mg via INTRAVENOUS
  Filled 2022-06-05: qty 1

## 2022-06-05 MED ORDER — SODIUM CHLORIDE 0.9 % IV SOLN
10.0000 mg | Freq: Once | INTRAVENOUS | Status: AC
  Administered 2022-06-05: 10 mg via INTRAVENOUS
  Filled 2022-06-05: qty 10

## 2022-06-05 MED ORDER — SODIUM CHLORIDE 0.9 % IV SOLN
200.0000 mg | Freq: Once | INTRAVENOUS | Status: AC
  Administered 2022-06-05: 200 mg via INTRAVENOUS
  Filled 2022-06-05: qty 200

## 2022-06-05 MED ORDER — SODIUM CHLORIDE 0.9 % IV SOLN
65.0000 mg/m2 | Freq: Once | INTRAVENOUS | Status: AC
  Administered 2022-06-05: 90 mg via INTRAVENOUS
  Filled 2022-06-05: qty 15

## 2022-06-05 MED ORDER — SODIUM CHLORIDE 0.9 % IV SOLN
Freq: Once | INTRAVENOUS | Status: AC
  Filled 2022-06-05: qty 250

## 2022-06-05 MED ORDER — SODIUM CHLORIDE 0.9% FLUSH
10.0000 mL | INTRAVENOUS | Status: DC | PRN
  Administered 2022-06-05: 10 mL
  Filled 2022-06-05: qty 10

## 2022-06-05 MED ORDER — HEPARIN SOD (PORK) LOCK FLUSH 100 UNIT/ML IV SOLN
500.0000 [IU] | Freq: Once | INTRAVENOUS | Status: AC | PRN
  Administered 2022-06-05: 500 [IU]
  Filled 2022-06-05: qty 5

## 2022-06-05 MED ORDER — FAMOTIDINE IN NACL 20-0.9 MG/50ML-% IV SOLN
20.0000 mg | Freq: Once | INTRAVENOUS | Status: AC
  Administered 2022-06-05: 20 mg via INTRAVENOUS
  Filled 2022-06-05: qty 50

## 2022-06-05 MED ORDER — SODIUM CHLORIDE 0.9 % IV SOLN
88.6500 mg | Freq: Once | INTRAVENOUS | Status: AC
  Administered 2022-06-05: 90 mg via INTRAVENOUS
  Filled 2022-06-05: qty 9

## 2022-06-05 NOTE — Progress Notes (Signed)
Patient states she started feeling nausea this morning for the first time.  Patient niece states she started patient back on the Cymbalta.

## 2022-06-05 NOTE — Patient Instructions (Signed)
[facility]  Discharge Instructions: Thank you for choosing [facility] to provide your oncology and hematology care.  If you have a lab appointment with the Cancer Center, please go directly to the Cancer Center and check in at the registration area.  Wear comfortable clothing and clothing appropriate for easy access to any Portacath or PICC line.   We strive to give you quality time with your provider. You may need to reschedule your appointment if you arrive late (15 or more minutes).  Arriving late affects you and other patients whose appointments are after yours.  Also, if you miss three or more appointments without notifying the office, you may be dismissed from the clinic at the provider's discretion.      For prescription refill requests, have your pharmacy contact our office and allow 72 hours for refills to be completed.    Today you received the following chemotherapy and/or immunotherapy agents Taxol and Carboplatin       To help prevent nausea and vomiting after your treatment, we encourage you to take your nausea medication as directed.  BELOW ARE SYMPTOMS THAT SHOULD BE REPORTED IMMEDIATELY: *FEVER GREATER THAN 100.4 F (38 C) OR HIGHER *CHILLS OR SWEATING *NAUSEA AND VOMITING THAT IS NOT CONTROLLED WITH YOUR NAUSEA MEDICATION *UNUSUAL SHORTNESS OF BREATH *UNUSUAL BRUISING OR BLEEDING *URINARY PROBLEMS (pain or burning when urinating, or frequent urination) *BOWEL PROBLEMS (unusual diarrhea, constipation, pain near the anus) TENDERNESS IN MOUTH AND THROAT WITH OR WITHOUT PRESENCE OF ULCERS (sore throat, sores in mouth, or a toothache) UNUSUAL RASH, SWELLING OR PAIN  UNUSUAL VAGINAL DISCHARGE OR ITCHING   Items with * indicate a potential emergency and should be followed up as soon as possible or go to the Emergency Department if any problems should occur.  Please show the CHEMOTHERAPY ALERT CARD or IMMUNOTHERAPY ALERT CARD at  check-in to the Emergency Department and triage nurse.  Should you have questions after your visit or need to cancel or reschedule your appointment, please contact [facility]  [phone] and follow the prompts.  Office hours are 8:00 a.m. to 4:30 p.m. Monday - Friday. Please note that voicemails left after 4:00 p.m. may not be returned until the following business day.  We are closed weekends and major holidays. You have access to a nurse at all times for urgent questions. Please call the main number to the clinic [phone] and follow the prompts.  For any non-urgent questions, you may also contact your provider using MyChart. We now offer e-Visits for anyone 18 and older to request care online for non-urgent symptoms. For details visit [email or web address].   Also download the MyChart app! Go to the app store, search "MyChart", open the app, select [place], and log in with your MyChart username and password.  Masks are optional in the cancer centers. If you would like for your care team to wear a mask while they are taking care of you, please let them know. For doctor visits, patients may have with them one support person who is at least 78 years old. At this time, visitors are not allowed in the infusion area.  

## 2022-06-05 NOTE — Progress Notes (Signed)
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  Showed a irregular mass measuring 2.3 x 3.2 x 3.6 cm.  There were 2 other smaller masses in the left breast measuring 4 x 7 x 9 mm and 3 x 5 x 5 mm.  7 abnormal lymph nodes in the left axilla.  Indeterminate solid and cystic mass in the right breast in the retroareolar region measuring 1 x 1.2 x 1.9 cm as well as another 4.5 cm group of indeterminate microcalcifications in the lower right breast.  Patient had 2 left breast biopsy along with left lymph node biopsy.  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   PET CT scan showed hypermetabolic left breast mass and clusters of small but hypermetabolic left axillary and subpectoral lymph nodes.  1.2 x 0.9 cm subpleural nodule with an SUV of 1.6.   Patient is being treated as per keynote 522 regimen starting on 01/09/2022    Interval history- tolerating treatment well so far.  Denies any neuropathy.  She has ongoing fatigue  ECOG PS- 1 Pain scale- 0   Review of systems- Review of Systems  Constitutional:  Positive for malaise/fatigue. Negative for chills, fever and weight loss.  HENT:  Negative for congestion, ear discharge and nosebleeds.   Eyes:  Negative for blurred vision.  Respiratory:  Negative for cough, hemoptysis, sputum production, shortness of breath and wheezing.   Cardiovascular:  Negative for chest pain, palpitations, orthopnea and claudication.  Gastrointestinal:  Negative for abdominal pain, blood in stool, constipation, diarrhea, heartburn, melena, nausea and vomiting.  Genitourinary:  Negative for dysuria, flank pain, frequency, hematuria and urgency.  Musculoskeletal:  Negative for back pain, joint pain and myalgias.  Skin:  Negative for rash.  Neurological:  Negative for dizziness, tingling, focal weakness, seizures, weakness and headaches.  Endo/Heme/Allergies:  Does not bruise/bleed easily.  Psychiatric/Behavioral:  Negative for depression and suicidal ideas. The patient does not have insomnia.       No Known Allergies   Past Medical History:  Diagnosis Date   Anemia    Anxiety    Arthritis    NECK   Cancer ([place])    BASAL CELL AND MELANOMA   DAVF (dural arteriovenous fistula) 2015   Dementia (HCC)    Family history of adverse reaction to anesthesia    niece has to come out of anesthesia slow   Hypertension    Pneumonia      Past Surgical History:  Procedure Laterality Date   BRAIN SURGERY  2015  DURAL AV FISTULA ([place] [facility])   BREAST BIOPSY Left 12/14/2021   [place] Bx 3:00 7 CMFN, Coil Clip, Path Pending   BREAST BIOPSY Left 12/14/2021   [place] Bx 10:00 2 CMFN, Venus Clip, path pending   breast biopsy Left 12/14/2021   [place] Axille, Hydromarker (butterfly). path pending   BREAST BIOPSY Right 12/19/2021   Stereo bx-calcs, "COIL" clip-path pending   BREAST BIOPSY Right 12/19/2021   u/s bx-mass, 3:00, retroareolar, "HEART"  clip-path pending   COLONOSCOPY     COLONOSCOPY WITH PROPOFOL N/A 02/20/2017   Procedure: COLONOSCOPY WITH PROPOFOL;  Surgeon: Manya Silvas, MD;  Location: [facility] ENDOSCOPY;  Service: Endoscopy;  Laterality: N/A;   HYSTEROSCOPY WITH D & C N/A 07/29/2015   Procedure: DILATATION AND CURETTAGE /HYSTEROSCOPY;  Surgeon: Benjaman Kindler, MD;  Location: [facility] ORS;  Service: Gynecology;  Laterality: N/A;   HYSTEROSCOPY WITH D & C N/A 01/24/2018   Procedure: DILATATION AND CURETTAGE /HYSTEROSCOPY;  Surgeon: Benjaman Kindler, MD;  Location: [facility] ORS;  Service: Gynecology;  Laterality: N/A;   LAPAROSCOPY N/A 01/24/2018   Procedure: LAPAROSCOPY DIAGNOSTIC;  Surgeon: Benjaman Kindler, MD;  Location: [facility] ORS;  Service: Gynecology;  Laterality: N/A;   PERIPHERAL VASCULAR THROMBECTOMY N/A 02/29/2020   Procedure: PERIPHERAL VASCULAR THROMBECTOMY / THROMBOLYSIS WITH POSSIBLE PULMONARY THROMBECTOMY / THROMBOLYSIS;  Surgeon: Algernon Huxley, MD;  Location: [place] [facility];  Service: Cardiovascular;  Laterality: N/A;   PORTACATH PLACEMENT N/A 01/08/2022   Procedure: INSERTION PORT-A-CATH;  Surgeon: Herbert Pun, MD;  Location: [facility] ORS;  Service: General;  Laterality: N/A;   TONSILLECTOMY     AGE 76   TOTAL LAPAROSCOPIC HYSTERECTOMY WITH BILATERAL SALPINGO OOPHORECTOMY Bilateral 12/28/2019   Procedure: TOTAL LAPAROSCOPIC HYSTERECTOMY WITH BILATERAL SALPINGO OOPHORECTOMY;  Surgeon: Benjaman Kindler, MD;  Location: [facility] ORS;  Service: Gynecology;  Laterality: Bilateral;   TUBAL LIGATION      Social History   Socioeconomic History   Marital status: Divorced    Spouse name: Not on file   Number of children: Not on file   Years of education: Not on file   Highest education level: Not on file  Occupational History   Not on file  Tobacco Use   Smoking status: Former    Packs/day: 0.50    Years: 15.00    Total pack years: 7.50    Types: Cigarettes    Quit date: 07/21/2000    Years since  quitting: 21.8   Smokeless tobacco: Never  Vaping Use   Vaping Use: Never used  Substance and Sexual Activity   Alcohol use: Not Currently   Drug use: No   Sexual activity: Not Currently  Other Topics Concern   Not on file  Social History Narrative   Lives alone, caregiver 50 year old daughter   Social Determinants of Health   Financial Resource Strain: Not on file  Food Insecurity: Not on file  Transportation Needs: Not on file  Physical Activity: Not on file  Stress: Not on file  Social Connections: Not on file  Intimate Partner Violence: Not on file    Family History  Problem Relation Age of Onset   Stomach cancer Sister    Hypertension Sister    Hypertension Sister    Diabetes Niece    Hypertension Niece    Breast cancer Neg Hx      Current Outpatient Medications:    benzonatate (TESSALON) 200 MG capsule, Take 1 capsule (200 mg total) by mouth 3 (three) times daily as needed for cough. (  Patient not taking: Reported on 04/24/2022), Disp: 60 capsule, Rfl: 0   buPROPion (WELLBUTRIN XL) 150 MG 24 hr tablet, Take 150 mg by mouth every morning., Disp: , Rfl: 6   cetirizine (ZYRTEC) 10 MG tablet, Take 10 mg by mouth daily as needed for allergies., Disp: , Rfl:    dexamethasone (DECADRON) 4 MG tablet, Take 2 tablets (8 mg total) by mouth daily. Start the day after chemotherapy for 2 days., Disp: 30 tablet, Rfl: 1   lidocaine-prilocaine (EMLA) cream, Apply 1 Application topically as needed., Disp: , Rfl:    LORazepam (ATIVAN) 0.5 MG tablet, Take 1 tablet (0.5 mg total) by mouth every 6 (six) hours as needed (Nausea or vomiting). (Patient not taking: Reported on 05/08/2022), Disp: 30 tablet, Rfl: 0   ondansetron (ZOFRAN) 8 MG tablet, Take 1 tablet (8 mg total) by mouth every 8 (eight) hours as needed for nausea or vomiting. (Patient not taking: Reported on 05/08/2022), Disp: 30 tablet, Rfl: 2   polyethylene glycol (MIRALAX / GLYCOLAX) 17 g packet, Take 17 g by mouth daily.  (Patient not taking: Reported on 04/03/2022), Disp: , Rfl:    prochlorperazine (COMPAZINE) 10 MG tablet, Take 1 tablet (10 mg total) by mouth every 6 (six) hours as needed (Nausea or vomiting). (Patient not taking: Reported on 05/08/2022), Disp: 30 tablet, Rfl: 1  Physical exam: There were no vitals filed for this visit. Physical Exam Cardiovascular:     Rate and Rhythm: Normal rate and regular rhythm.     Heart sounds: Normal heart sounds.  Pulmonary:     Effort: Pulmonary effort is normal.     Breath sounds: Normal breath sounds.  Skin:    General: Skin is warm and dry.  Neurological:     Mental Status: She is alert and oriented to person, place, and time.         Latest Ref Rng & Units 06/05/2022    8:07 AM  CMP  Glucose 70 - 99 mg/dL 128   BUN 8 - 23 mg/dL 11   Creatinine 0.44 - 1.00 mg/dL 0.73   Sodium 135 - 145 mmol/L 138   Potassium 3.5 - 5.1 mmol/L 3.8   Chloride 98 - 111 mmol/L 109   CO2 22 - 32 mmol/L 25   Calcium 8.9 - 10.3 mg/dL 8.8   Total Protein 6.5 - 8.1 g/dL 6.1   Total Bilirubin 0.3 - 1.2 mg/dL 0.7   Alkaline Phos 38 - 126 U/L 77   AST 15 - 41 U/L 22   ALT 0 - 44 U/L 13       Latest Ref Rng & Units 06/05/2022    8:07 AM  CBC  WBC 4.0 - 10.5 K/uL 5.7   Hemoglobin 12.0 - 15.0 g/dL 10.2   Hematocrit 36.0 - 46.0 % 31.4   Platelets 150 - 400 K/uL 180      Assessment and plan- Patient is a 76 y.o. 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  She is here for on treatment assessment prior to cycle 4-day 1 of weekly CarboTaxol chemotherapy along with Keytruda  Counts okay to proceed with cycle 4-day 1 of CarboTaxol Keytruda chemotherapy today.  She will directly proceed for cycle 4-day 8 of CarboTaxol chemotherapy next week and I will see her back in 2 weeks for cycle 4-day 15 of CarboTaxol chemotherapy which would be her last cycle.  She  will then be  seen by Dr. Peyton Najjar to discuss surgery.  Chemo-induced anemia: Likely secondary to chemotherapy.  Ferritin is normal at 283.  Iron saturation low at 10% likely secondary to chronic disease.  B12 and folate are normal.  Continue to monitor   Visit Diagnosis 1. Encounter for antineoplastic chemotherapy   2. Malignant neoplasm of upper-outer quadrant of left breast in female, estrogen receptor negative ([place])      Dr. Randa Evens, MD, MPH [facility] [phone] 06/05/2022 8:05 AM

## 2022-06-08 MED FILL — Dexamethasone Sodium Phosphate Inj 100 MG/10ML: INTRAMUSCULAR | Qty: 1 | Status: AC

## 2022-06-12 ENCOUNTER — Other Ambulatory Visit: Payer: Self-pay | Admitting: Oncology

## 2022-06-12 ENCOUNTER — Inpatient Hospital Stay: Payer: PPO | Admitting: Oncology

## 2022-06-12 ENCOUNTER — Inpatient Hospital Stay: Payer: PPO | Attending: Oncology

## 2022-06-12 ENCOUNTER — Inpatient Hospital Stay: Payer: PPO

## 2022-06-12 VITALS — BP 105/53 | HR 84 | Temp 97.6°F | Resp 18 | Wt 94.6 lb

## 2022-06-12 DIAGNOSIS — T451X5A Adverse effect of antineoplastic and immunosuppressive drugs, initial encounter: Secondary | ICD-10-CM | POA: Diagnosis not present

## 2022-06-12 DIAGNOSIS — Z87891 Personal history of nicotine dependence: Secondary | ICD-10-CM | POA: Insufficient documentation

## 2022-06-12 DIAGNOSIS — D701 Agranulocytosis secondary to cancer chemotherapy: Secondary | ICD-10-CM | POA: Insufficient documentation

## 2022-06-12 DIAGNOSIS — Z171 Estrogen receptor negative status [ER-]: Secondary | ICD-10-CM

## 2022-06-12 DIAGNOSIS — C50412 Malignant neoplasm of upper-outer quadrant of left female breast: Secondary | ICD-10-CM | POA: Insufficient documentation

## 2022-06-12 DIAGNOSIS — Z79899 Other long term (current) drug therapy: Secondary | ICD-10-CM | POA: Insufficient documentation

## 2022-06-12 DIAGNOSIS — D6481 Anemia due to antineoplastic chemotherapy: Secondary | ICD-10-CM | POA: Diagnosis not present

## 2022-06-12 DIAGNOSIS — Z5111 Encounter for antineoplastic chemotherapy: Secondary | ICD-10-CM | POA: Insufficient documentation

## 2022-06-12 LAB — COMPREHENSIVE METABOLIC PANEL
ALT: 15 U/L (ref 0–44)
AST: 25 U/L (ref 15–41)
Albumin: 3.4 g/dL — ABNORMAL LOW (ref 3.5–5.0)
Alkaline Phosphatase: 64 U/L (ref 38–126)
Anion gap: 7 (ref 5–15)
BUN: 12 mg/dL (ref 8–23)
CO2: 24 mmol/L (ref 22–32)
Calcium: 8.4 mg/dL — ABNORMAL LOW (ref 8.9–10.3)
Chloride: 107 mmol/L (ref 98–111)
Creatinine, Ser: 0.69 mg/dL (ref 0.44–1.00)
GFR, Estimated: >60 mL/min (ref >60)
Glucose, Bld: 123 mg/dL — ABNORMAL HIGH (ref 70–99)
Potassium: 3.8 mmol/L (ref 3.5–5.1)
Sodium: 138 mmol/L (ref 135–145)
Total Bilirubin: 0.9 mg/dL (ref 0.3–1.2)
Total Protein: 5.9 g/dL — ABNORMAL LOW (ref 6.5–8.1)

## 2022-06-12 LAB — CBC WITH DIFFERENTIAL/PLATELET
Abs Immature Granulocytes: 0.03 10*3/uL (ref 0.00–0.07)
Basophils Absolute: 0 10*3/uL (ref 0.0–0.1)
Basophils Relative: 1 %
Eosinophils Absolute: 0 10*3/uL (ref 0.0–0.5)
Eosinophils Relative: 1 %
HCT: 32.4 % — ABNORMAL LOW (ref 36.0–46.0)
Hemoglobin: 10.6 g/dL — ABNORMAL LOW (ref 12.0–15.0)
Immature Granulocytes: 1 %
Lymphocytes Relative: 16 %
Lymphs Abs: 0.6 10*3/uL — ABNORMAL LOW (ref 0.7–4.0)
MCH: 33.3 pg (ref 26.0–34.0)
MCHC: 32.7 g/dL (ref 30.0–36.0)
MCV: 101.9 fL — ABNORMAL HIGH (ref 80.0–100.0)
Monocytes Absolute: 0.2 10*3/uL (ref 0.1–1.0)
Monocytes Relative: 5 %
Neutro Abs: 2.7 10*3/uL (ref 1.7–7.7)
Neutrophils Relative %: 76 %
Platelets: 168 10*3/uL (ref 150–400)
RBC: 3.18 MIL/uL — ABNORMAL LOW (ref 3.87–5.11)
RDW: 18 % — ABNORMAL HIGH (ref 11.5–15.5)
WBC: 3.6 10*3/uL — ABNORMAL LOW (ref 4.0–10.5)
nRBC: 0 % (ref 0.0–0.2)

## 2022-06-12 MED ORDER — FAMOTIDINE IN NACL 20-0.9 MG/50ML-% IV SOLN
20.0000 mg | Freq: Once | INTRAVENOUS | Status: AC
  Administered 2022-06-12: 20 mg via INTRAVENOUS
  Filled 2022-06-12: qty 50

## 2022-06-12 MED ORDER — SODIUM CHLORIDE 0.9 % IV SOLN
10.0000 mg | Freq: Once | INTRAVENOUS | Status: AC
  Administered 2022-06-12: 10 mg via INTRAVENOUS
  Filled 2022-06-12: qty 10

## 2022-06-12 MED ORDER — SODIUM CHLORIDE 0.9 % IV SOLN
88.6500 mg | Freq: Once | INTRAVENOUS | Status: AC
  Administered 2022-06-12: 90 mg via INTRAVENOUS
  Filled 2022-06-12: qty 9

## 2022-06-12 MED ORDER — SODIUM CHLORIDE 0.9 % IV SOLN
Freq: Once | INTRAVENOUS | Status: AC
  Filled 2022-06-12: qty 250

## 2022-06-12 MED ORDER — PALONOSETRON HCL INJECTION 0.25 MG/5ML
0.2500 mg | Freq: Once | INTRAVENOUS | Status: AC
  Administered 2022-06-12: 0.25 mg via INTRAVENOUS
  Filled 2022-06-12: qty 5

## 2022-06-12 MED ORDER — SODIUM CHLORIDE 0.9 % IV SOLN
65.0000 mg/m2 | Freq: Once | INTRAVENOUS | Status: AC
  Administered 2022-06-12: 90 mg via INTRAVENOUS
  Filled 2022-06-12: qty 15

## 2022-06-12 MED ORDER — HEPARIN SOD (PORK) LOCK FLUSH 100 UNIT/ML IV SOLN
500.0000 [IU] | Freq: Once | INTRAVENOUS | Status: AC | PRN
  Administered 2022-06-12: 500 [IU]
  Filled 2022-06-12: qty 5

## 2022-06-12 MED ORDER — DIPHENHYDRAMINE HCL 50 MG/ML IJ SOLN
25.0000 mg | Freq: Once | INTRAMUSCULAR | Status: AC
  Administered 2022-06-12: 25 mg via INTRAVENOUS
  Filled 2022-06-12: qty 1

## 2022-06-14 LAB — T4: T4, Total: 10.3 ug/dL (ref 4.5–12.0)

## 2022-06-18 MED FILL — Iron Sucrose Inj 20 MG/ML (Fe Equiv): INTRAVENOUS | Qty: 10 | Status: AC

## 2022-06-18 MED FILL — Dexamethasone Sodium Phosphate Inj 100 MG/10ML: INTRAMUSCULAR | Qty: 1 | Status: AC

## 2022-06-19 ENCOUNTER — Inpatient Hospital Stay: Payer: PPO

## 2022-06-19 ENCOUNTER — Inpatient Hospital Stay (HOSPITAL_BASED_OUTPATIENT_CLINIC_OR_DEPARTMENT_OTHER): Payer: PPO | Admitting: Oncology

## 2022-06-19 ENCOUNTER — Encounter: Payer: Self-pay | Admitting: Oncology

## 2022-06-19 VITALS — BP 145/80 | HR 98 | Temp 98.0°F | Resp 18

## 2022-06-19 VITALS — BP 118/59 | HR 42 | Temp 97.5°F | Resp 16 | Ht 59.0 in | Wt 93.4 lb

## 2022-06-19 DIAGNOSIS — D6959 Other secondary thrombocytopenia: Secondary | ICD-10-CM | POA: Diagnosis not present

## 2022-06-19 DIAGNOSIS — D6481 Anemia due to antineoplastic chemotherapy: Secondary | ICD-10-CM

## 2022-06-19 DIAGNOSIS — T451X5A Adverse effect of antineoplastic and immunosuppressive drugs, initial encounter: Secondary | ICD-10-CM

## 2022-06-19 DIAGNOSIS — D701 Agranulocytosis secondary to cancer chemotherapy: Secondary | ICD-10-CM

## 2022-06-19 DIAGNOSIS — Z2989 Encounter for other specified prophylactic measures: Secondary | ICD-10-CM

## 2022-06-19 DIAGNOSIS — Z171 Estrogen receptor negative status [ER-]: Secondary | ICD-10-CM

## 2022-06-19 DIAGNOSIS — C50412 Malignant neoplasm of upper-outer quadrant of left female breast: Secondary | ICD-10-CM | POA: Diagnosis not present

## 2022-06-19 DIAGNOSIS — Z5111 Encounter for antineoplastic chemotherapy: Secondary | ICD-10-CM

## 2022-06-19 LAB — CBC WITH DIFFERENTIAL/PLATELET
Abs Immature Granulocytes: 0.02 10*3/uL (ref 0.00–0.07)
Basophils Absolute: 0 10*3/uL (ref 0.0–0.1)
Basophils Relative: 1 %
Eosinophils Absolute: 0 10*3/uL (ref 0.0–0.5)
Eosinophils Relative: 0 %
HCT: 31.1 % — ABNORMAL LOW (ref 36.0–46.0)
Hemoglobin: 10.1 g/dL — ABNORMAL LOW (ref 12.0–15.0)
Immature Granulocytes: 1 %
Lymphocytes Relative: 21 %
Lymphs Abs: 0.6 10*3/uL — ABNORMAL LOW (ref 0.7–4.0)
MCH: 33.2 pg (ref 26.0–34.0)
MCHC: 32.5 g/dL (ref 30.0–36.0)
MCV: 102.3 fL — ABNORMAL HIGH (ref 80.0–100.0)
Monocytes Absolute: 0.2 10*3/uL (ref 0.1–1.0)
Monocytes Relative: 8 %
Neutro Abs: 2 10*3/uL (ref 1.7–7.7)
Neutrophils Relative %: 69 %
Platelets: 129 10*3/uL — ABNORMAL LOW (ref 150–400)
RBC: 3.04 MIL/uL — ABNORMAL LOW (ref 3.87–5.11)
RDW: 18.1 % — ABNORMAL HIGH (ref 11.5–15.5)
WBC: 2.9 10*3/uL — ABNORMAL LOW (ref 4.0–10.5)
nRBC: 0 % (ref 0.0–0.2)

## 2022-06-19 LAB — COMPREHENSIVE METABOLIC PANEL
ALT: 15 U/L (ref 0–44)
AST: 25 U/L (ref 15–41)
Albumin: 3.4 g/dL — ABNORMAL LOW (ref 3.5–5.0)
Alkaline Phosphatase: 61 U/L (ref 38–126)
Anion gap: 6 (ref 5–15)
BUN: 13 mg/dL (ref 8–23)
CO2: 25 mmol/L (ref 22–32)
Calcium: 8.8 mg/dL — ABNORMAL LOW (ref 8.9–10.3)
Chloride: 109 mmol/L (ref 98–111)
Creatinine, Ser: 0.77 mg/dL (ref 0.44–1.00)
GFR, Estimated: >60 mL/min (ref >60)
Glucose, Bld: 99 mg/dL (ref 70–99)
Potassium: 3.8 mmol/L (ref 3.5–5.1)
Sodium: 140 mmol/L (ref 135–145)
Total Bilirubin: 0.7 mg/dL (ref 0.3–1.2)
Total Protein: 5.5 g/dL — ABNORMAL LOW (ref 6.5–8.1)

## 2022-06-19 LAB — TSH: TSH: 9.568 u[IU]/mL — ABNORMAL HIGH (ref 0.350–4.500)

## 2022-06-19 LAB — T4, FREE: Free T4: 0.99 ng/dL (ref 0.61–1.12)

## 2022-06-19 MED ORDER — SODIUM CHLORIDE 0.9 % IV SOLN
88.6500 mg | Freq: Once | INTRAVENOUS | Status: AC
  Administered 2022-06-19: 90 mg via INTRAVENOUS
  Filled 2022-06-19: qty 9

## 2022-06-19 MED ORDER — HEPARIN SOD (PORK) LOCK FLUSH 100 UNIT/ML IV SOLN
500.0000 [IU] | Freq: Once | INTRAVENOUS | Status: AC | PRN
  Filled 2022-06-19: qty 5

## 2022-06-19 MED ORDER — FAMOTIDINE IN NACL 20-0.9 MG/50ML-% IV SOLN
20.0000 mg | Freq: Once | INTRAVENOUS | Status: AC
  Administered 2022-06-19: 20 mg via INTRAVENOUS
  Filled 2022-06-19: qty 50

## 2022-06-19 MED ORDER — PALONOSETRON HCL INJECTION 0.25 MG/5ML
0.2500 mg | Freq: Once | INTRAVENOUS | Status: AC
  Administered 2022-06-19: 0.25 mg via INTRAVENOUS
  Filled 2022-06-19: qty 5

## 2022-06-19 MED ORDER — SODIUM CHLORIDE 0.9 % IV SOLN
10.0000 mg | Freq: Once | INTRAVENOUS | Status: AC
  Administered 2022-06-19: 10 mg via INTRAVENOUS
  Filled 2022-06-19: qty 10

## 2022-06-19 MED ORDER — SODIUM CHLORIDE 0.9 % IV SOLN
65.0000 mg/m2 | Freq: Once | INTRAVENOUS | Status: AC
  Administered 2022-06-19: 90 mg via INTRAVENOUS
  Filled 2022-06-19: qty 15

## 2022-06-19 MED ORDER — DIPHENHYDRAMINE HCL 50 MG/ML IJ SOLN
25.0000 mg | Freq: Once | INTRAMUSCULAR | Status: AC
  Administered 2022-06-19: 25 mg via INTRAVENOUS
  Filled 2022-06-19: qty 1

## 2022-06-19 MED ORDER — SODIUM CHLORIDE 0.9 % IV SOLN
Freq: Once | INTRAVENOUS | Status: AC
  Filled 2022-06-19: qty 250

## 2022-06-19 MED ORDER — SODIUM CHLORIDE 0.9 % IV SOLN
200.0000 mg | INTRAVENOUS | Status: DC
  Administered 2022-06-19: 200 mg via INTRAVENOUS
  Filled 2022-06-19: qty 200

## 2022-06-19 MED ORDER — HEPARIN SOD (PORK) LOCK FLUSH 100 UNIT/ML IV SOLN
INTRAVENOUS | Status: AC
  Administered 2022-06-19: 500 [IU]
  Filled 2022-06-19: qty 5

## 2022-06-19 NOTE — Addendum Note (Signed)
Addended by: Rico Junker on: 06/19/2022 03:34 PM   Modules accepted: Orders

## 2022-06-19 NOTE — Progress Notes (Signed)
Nutrition Follow-up:   Patient with left breast cancer.  Patient receiving carboplatin and taxol.    Met with patient during infusion.  Niece not present.  Patient reports that she is eating. Denies nausea, constipation, diarrhea.  Patient lives with special needs daughter.  Has nieces that check on her couple of times a week. Has not been drinking ensure. Says she gets to full and can't drink it.  Says that she is eating english muffin with peanut butter for breakfast.  Lunch she says she likes chicken sandwich.  Can't describe a dinner meal.      Medications: reviewed  Labs: reviewed  Anthropometrics:   Weight 93 lb 6.4 oz   94 lb on 12/19 102 lb on 8/22  NUTRITION DIAGNOSIS: Unintentional weight loss stable    INTERVENTION:  Reviewed ways to add calories and protein in diet.  Encouraged snacks/oral nutrition supplement if possible     MONITORING, EVALUATION, GOAL: weight trends, intake   NEXT VISIT: Friday, Jan 26 during infusion  Brandy Kabat B. Zenia Resides, [place], [place] Registered Dietitian [phone]

## 2022-06-19 NOTE — Patient Instructions (Signed)
[facility] [place]  Discharge Instructions: Thank you for choosing [facility] to provide your oncology and hematology care.  If you have a lab appointment with the Cancer Center, please go directly to the Cancer Center and check in at the registration area.  Wear comfortable clothing and clothing appropriate for easy access to any Portacath or PICC line.   We strive to give you quality time with your provider. You may need to reschedule your appointment if you arrive late (15 or more minutes).  Arriving late affects you and other patients whose appointments are after yours.  Also, if you miss three or more appointments without notifying the office, you may be dismissed from the clinic at the provider's discretion.      For prescription refill requests, have your pharmacy contact our office and allow 72 hours for refills to be completed.    Today you received the following chemotherapy and/or immunotherapy agents Taxol and Carboplatin        To help prevent nausea and vomiting after your treatment, we encourage you to take your nausea medication as directed.  BELOW ARE SYMPTOMS THAT SHOULD BE REPORTED IMMEDIATELY: *FEVER GREATER THAN 100.4 F (38 C) OR HIGHER *CHILLS OR SWEATING *NAUSEA AND VOMITING THAT IS NOT CONTROLLED WITH YOUR NAUSEA MEDICATION *UNUSUAL SHORTNESS OF BREATH *UNUSUAL BRUISING OR BLEEDING *URINARY PROBLEMS (pain or burning when urinating, or frequent urination) *BOWEL PROBLEMS (unusual diarrhea, constipation, pain near the anus) TENDERNESS IN MOUTH AND THROAT WITH OR WITHOUT PRESENCE OF ULCERS (sore throat, sores in mouth, or a toothache) UNUSUAL RASH, SWELLING OR PAIN  UNUSUAL VAGINAL DISCHARGE OR ITCHING   Items with * indicate a potential emergency and should be followed up as soon as possible or go to the Emergency Department if any problems should occur.  Please show the CHEMOTHERAPY ALERT CARD or IMMUNOTHERAPY ALERT CARD  at check-in to the Emergency Department and triage nurse.  Should you have questions after your visit or need to cancel or reschedule your appointment, please contact [facility]  [phone] and follow the prompts.  Office hours are 8:00 a.m. to 4:30 p.m. Monday - Friday. Please note that voicemails left after 4:00 p.m. may not be returned until the following business day.  We are closed weekends and major holidays. You have access to a nurse at all times for urgent questions. Please call the main number to the clinic [phone] and follow the prompts.  For any non-urgent questions, you may also contact your provider using MyChart. We now offer e-Visits for anyone 18 and older to request care online for non-urgent symptoms. For details visit [email or web address].   Also download the MyChart app! Go to the app store, search "MyChart", open the app, select [place], and log in with your MyChart username and password.    

## 2022-06-19 NOTE — Progress Notes (Signed)
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  Showed a irregular mass measuring 2.3 x 3.2 x 3.6 cm.  There were 2 other smaller masses in the left breast measuring 4 x 7 x 9 mm and 3 x 5 x 5 mm.  7 abnormal lymph nodes in the left axilla.  Indeterminate solid and cystic mass in the right breast in the retroareolar region measuring 1 x 1.2 x 1.9 cm as well as another 4.5 cm group of indeterminate microcalcifications in the lower right breast.  Patient had 2 left breast biopsy along with left lymph node biopsy.  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   PET CT scan showed hypermetabolic left breast mass and clusters of small but hypermetabolic left axillary and subpectoral lymph nodes.  1.2 x 0.9 cm subpleural nodule with an SUV of 1.6.   Patient is being treated as per keynote 522 regimen starting on 01/09/2022    Interval history-tolerating chemotherapy well so far.   Denies any chest pain shortness of breath or lightheadedness.  ECOG PS- 1 Pain scale- 0   Review of systems- Review of Systems  Constitutional:  Negative for chills, fever, malaise/fatigue and weight loss.  HENT:  Negative for congestion, ear discharge and nosebleeds.   Eyes:  Negative for blurred vision.  Respiratory:  Negative for cough, hemoptysis, sputum production, shortness of breath and wheezing.   Cardiovascular:  Negative for chest pain, palpitations, orthopnea and claudication.  Gastrointestinal:  Negative for abdominal pain, blood in stool, constipation, diarrhea, heartburn, melena, nausea and vomiting.  Genitourinary:  Negative for dysuria, flank pain, frequency, hematuria and urgency.  Musculoskeletal:  Negative for back pain, joint pain and myalgias.  Skin:  Negative for rash.  Neurological:  Negative for dizziness, tingling, focal weakness, seizures, weakness and headaches.  Endo/Heme/Allergies:  Does not bruise/bleed easily.  Psychiatric/Behavioral:  Negative for depression and suicidal ideas. The patient does not have insomnia.       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 path pending   BREAST BIOPSY Right 12/19/2021   Stereo bx-calcs, "COIL" clip-path pending   BREAST BIOPSY Right 12/19/2021   u/s bx-mass, 3:00, retroareolar, "HEART"  clip-path pending   COLONOSCOPY     COLONOSCOPY WITH PROPOFOL N/A 02/20/2017   Procedure: COLONOSCOPY WITH PROPOFOL;  Surgeon: Manya Silvas, MD;  [facility] END[facility]COPY;  Service: Endoscopy;  Laterality: N/A;   HYSTEROSCOPY WITH D & C N/A 07/29/2015   Procedure: DILATATION AND CURETTAGE /HYSTEROSCOPY;  Surgeon: Benjaman Kindler, MD;  Location: [facility] ORS;  Service: Gynecology;  Laterality: N/A;   HYSTEROSCOPY WITH D & C N/A 01/24/2018   Procedure: DILATATION AND CURETTAGE /HYSTEROSCOPY;  Surgeon: Benjaman Kindler, MD;  Location: [facility] ORS;  Service: Gynecology;  Laterality: N/A;   LAPAROSCOPY N/A 01/24/2018   Procedure: LAPAROSCOPY DIAGNOSTIC;  Surgeon: Benjaman Kindler, MD;  Location: [facility] ORS;  Service: Gynecology;  Laterality: N/A;   PERIPHERAL VASCULAR THROMBECTOMY N/A 02/29/2020   Procedure: PERIPHERAL VASCULAR THROMBECTOMY / THROMBOLYSIS WITH POSSIBLE PULMONARY THROMBECTOMY / THROMBOLYSIS;  Surgeon: Algernon Huxley, MD;  Location: [place] CV LAB;  Service: Cardiovascular;  Laterality: N/A;   PORTACATH PLACEMENT N/A 01/08/2022   Procedure: INSERTION PORT-A-CATH;  Surgeon: Herbert Pun, MD;  Location: [facility] ORS;  Service: General;  Laterality: N/A;   TONSILLECTOMY     AGE 18   TOTAL LAPAROSCOPIC HYSTERECTOMY WITH BILATERAL SALPINGO OOPHORECTOMY Bilateral 12/28/2019   Procedure: TOTAL LAPAROSCOPIC HYSTERECTOMY WITH BILATERAL SALPINGO OOPHORECTOMY;  Surgeon: Benjaman Kindler, MD;  Location: [facility] ORS;  Service: Gynecology;  Laterality: Bilateral;   TUBAL LIGATION      Social History   Socioeconomic History   Marital status: Divorced    Spouse name: Not on file   Number of children: Not on file   Years of education: Not on file   Highest education level: Not on file  Occupational History   Not on file  Tobacco Use   Smoking status: Former    Packs/day: 0.50    Years: 15.00    Total pack years: 7.50    Types: Cigarettes    Quit date: 07/21/2000    Years since  quitting: 21.9   Smokeless tobacco: Never  Vaping Use   Vaping Use: Never used  Substance and Sexual Activity   Alcohol use: Not Currently   Drug use: No   Sexual activity: Not Currently  Other Topics Concern   Not on file  Social History Narrative   Lives alone, caregiver 40 year old daughter   Social Determinants of Health   Financial Resource Strain: Not on file  Food Insecurity: Not on file  Transportation Needs: Not on file  Physical Activity: Not on file  Stress: Not on file  Social Connections: Not on file  Intimate Partner Violence: Not on file    Family History  Problem Relation Age of Onset   Stomach cancer Sister    Hypertension Sister    Hypertension Sister    Diabetes Niece    Hypertension Niece    Breast cancer Neg Hx      Current Outpatient Medications:    benzonatate (TESSALON) 200 MG capsule, Take 1 capsule (200 mg total) by mouth 3 (three) times daily as needed for cough. (  Patient not taking: Reported on 04/24/2022), Disp: 60 capsule, Rfl: 0   buPROPion (WELLBUTRIN XL) 150 MG 24 hr tablet, Take 150 mg by mouth every morning., Disp: , Rfl: 6   cetirizine (ZYRTEC) 10 MG tablet, Take 10 mg by mouth daily as needed for allergies., Disp: , Rfl:    dexamethasone (DECADRON) 4 MG tablet, Take 2 tablets (8 mg total) by mouth daily. Start the day after chemotherapy for 2 days., Disp: 30 tablet, Rfl: 1   DULoxetine (CYMBALTA) 20 MG capsule, Take 20 mg by mouth daily., Disp: , Rfl:    lidocaine-prilocaine (EMLA) cream, Apply 1 Application topically as needed., Disp: , Rfl:    LORazepam (ATIVAN) 0.5 MG tablet, Take 1 tablet (0.5 mg total) by mouth every 6 (six) hours as needed (Nausea or vomiting). (Patient not taking: Reported on 05/08/2022), Disp: 30 tablet, Rfl: 0   ondansetron (ZOFRAN) 8 MG tablet, Take 1 tablet (8 mg total) by mouth every 8 (eight) hours as needed for nausea or vomiting. (Patient not taking: Reported on 05/08/2022), Disp: 30 tablet, Rfl: 2    polyethylene glycol (MIRALAX / GLYCOLAX) 17 g packet, Take 17 g by mouth daily., Disp: , Rfl:    prochlorperazine (COMPAZINE) 10 MG tablet, Take 1 tablet (10 mg total) by mouth every 6 (six) hours as needed (Nausea or vomiting). (Patient not taking: Reported on 05/08/2022), Disp: 30 tablet, Rfl: 1  Physical exam:  Vitals:   06/19/22 0931  BP: (!) 118/59  Pulse: (!) 42  Resp: 16  Temp: (!) 97.5 F (36.4 C)  TempSrc: Tympanic  Weight: 93 lb 6.4 oz (42.4 kg)  Height: '4\' 11"'$  (1.499 m)   Physical Exam Cardiovascular:     Rate and Rhythm: Regular rhythm. Bradycardia present.     Heart sounds: Normal heart sounds.  Pulmonary:     Effort: Pulmonary effort is normal.     Breath sounds: Normal breath sounds.  Skin:    General: Skin is warm and dry.  Neurological:     Mental Status: She is alert and oriented to person, place, and time.         Latest Ref Rng & Units 06/12/2022    9:16 AM  CMP  Glucose 70 - 99 mg/dL 123   BUN 8 - 23 mg/dL 12   Creatinine 0.44 - 1.00 mg/dL 0.69   Sodium 135 - 145 mmol/L 138   Potassium 3.5 - 5.1 mmol/L 3.8   Chloride 98 - 111 mmol/L 107   CO2 22 - 32 mmol/L 24   Calcium 8.9 - 10.3 mg/dL 8.4   Total Protein 6.5 - 8.1 g/dL 5.9   Total Bilirubin 0.3 - 1.2 mg/dL 0.9   Alkaline Phos 38 - 126 U/L 64   AST 15 - 41 U/L 25   ALT 0 - 44 U/L 15       Latest Ref Rng & Units 06/19/2022    8:53 AM  CBC  WBC 4.0 - 10.5 K/uL 2.9   Hemoglobin 12.0 - 15.0 g/dL 10.1   Hematocrit 36.0 - 46.0 % 31.1   Platelets 150 - 400 K/uL 129     Assessment and plan- Patient is a 77 y.o. 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  She is here for on treatment assessment prior to cycle 4-day 15 of weekly CarboTaxol chemotherapy  Counts okay to proceed with cycle 4-day 15 of CarboTaxol  chemotherapy today which would be her last cycle.  She does have some chemo-induced  neutropenia and will receive growth factor support for the next 3 days.  She is seeing Dr. Peyton Najjar to discuss bilateral mastectomy later this week.  I will see if her insurance would approve a PET scan to make sure that she does not have any new metastatic disease prior to proceeding with a bilateral mastectomy.  Bradycardia: Could be secondary to Taxol.  Patient remains asymptomatic and has not had any episodes of syncope.  Blood pressure is normal.  Continue to monitor.  I will see her back in 1 month with labs  Following surgery patient will get 9 cycles of adjuvant Keytruda as well as adjuvant radiation.  If there is evidence of residual disease she would also qualify for adjuvant Xeloda which she will start upon completion of radiation treatment.  Chemo-induced thrombocytopenia: Mild continue to monitor   Visit Diagnosis 1. Encounter for antineoplastic chemotherapy   2. Malignant neoplasm of upper-outer quadrant of left breast in female, estrogen receptor negative ([place])   3. Chemotherapy induced neutropenia (HCC)   4. Chemotherapy-induced thrombocytopenia      Dr. Randa Evens, MD, MPH [facility] [phone] 06/19/2022 9:03 AM

## 2022-06-20 ENCOUNTER — Inpatient Hospital Stay: Payer: PPO

## 2022-06-20 ENCOUNTER — Other Ambulatory Visit: Payer: Self-pay

## 2022-06-20 DIAGNOSIS — Z5111 Encounter for antineoplastic chemotherapy: Secondary | ICD-10-CM | POA: Diagnosis not present

## 2022-06-20 DIAGNOSIS — Z171 Estrogen receptor negative status [ER-]: Secondary | ICD-10-CM

## 2022-06-20 MED ORDER — FILGRASTIM-AAFI 480 MCG/0.8ML IJ SOSY
480.0000 ug | PREFILLED_SYRINGE | Freq: Once | INTRAMUSCULAR | Status: AC
  Administered 2022-06-20: 480 ug via SUBCUTANEOUS
  Filled 2022-06-20: qty 0.8

## 2022-06-20 MED FILL — Iron Sucrose Inj 20 MG/ML (Fe Equiv): INTRAVENOUS | Qty: 10 | Status: AC

## 2022-06-20 NOTE — Patient Instructions (Signed)
To prevention side effects from this medication, Please take Claritin 10 mg daily x 5 days.

## 2022-06-21 ENCOUNTER — Inpatient Hospital Stay: Payer: PPO

## 2022-06-21 ENCOUNTER — Ambulatory Visit: Payer: PPO

## 2022-06-21 ENCOUNTER — Other Ambulatory Visit: Payer: Self-pay | Admitting: General Surgery

## 2022-06-21 DIAGNOSIS — C773 Secondary and unspecified malignant neoplasm of axilla and upper limb lymph nodes: Secondary | ICD-10-CM | POA: Diagnosis not present

## 2022-06-21 DIAGNOSIS — C50912 Malignant neoplasm of unspecified site of left female breast: Secondary | ICD-10-CM | POA: Diagnosis not present

## 2022-06-21 DIAGNOSIS — Z5111 Encounter for antineoplastic chemotherapy: Secondary | ICD-10-CM | POA: Diagnosis not present

## 2022-06-21 DIAGNOSIS — C50412 Malignant neoplasm of upper-outer quadrant of left female breast: Secondary | ICD-10-CM

## 2022-06-21 DIAGNOSIS — N6091 Unspecified benign mammary dysplasia of right breast: Secondary | ICD-10-CM | POA: Diagnosis not present

## 2022-06-21 MED ORDER — FILGRASTIM-AAFI 480 MCG/0.8ML IJ SOSY
480.0000 ug | PREFILLED_SYRINGE | Freq: Once | INTRAMUSCULAR | Status: AC
  Administered 2022-06-21: 480 ug via SUBCUTANEOUS
  Filled 2022-06-21: qty 0.8

## 2022-06-22 ENCOUNTER — Inpatient Hospital Stay: Payer: PPO

## 2022-06-22 ENCOUNTER — Other Ambulatory Visit: Payer: Self-pay | Admitting: General Surgery

## 2022-06-22 DIAGNOSIS — C50912 Malignant neoplasm of unspecified site of left female breast: Secondary | ICD-10-CM

## 2022-06-22 DIAGNOSIS — N6091 Unspecified benign mammary dysplasia of right breast: Secondary | ICD-10-CM

## 2022-06-22 DIAGNOSIS — Z5111 Encounter for antineoplastic chemotherapy: Secondary | ICD-10-CM | POA: Diagnosis not present

## 2022-06-22 DIAGNOSIS — Z171 Estrogen receptor negative status [ER-]: Secondary | ICD-10-CM

## 2022-06-22 MED ORDER — FILGRASTIM-AAFI 480 MCG/0.8ML IJ SOSY
480.0000 ug | PREFILLED_SYRINGE | Freq: Once | INTRAMUSCULAR | Status: AC
  Administered 2022-06-22: 480 ug via SUBCUTANEOUS
  Filled 2022-06-22: qty 0.8

## 2022-06-23 ENCOUNTER — Other Ambulatory Visit: Payer: Self-pay

## 2022-06-25 ENCOUNTER — Ambulatory Visit
Admission: RE | Admit: 2022-06-25 | Discharge: 2022-06-25 | Disposition: A | Discharge status: Home / Self Care | Payer: PPO | Source: Ambulatory Visit | Attending: General Surgery | Admitting: General Surgery

## 2022-06-25 ENCOUNTER — Other Ambulatory Visit: Payer: Self-pay | Admitting: General Surgery

## 2022-06-25 DIAGNOSIS — R590 Localized enlarged lymph nodes: Secondary | ICD-10-CM | POA: Diagnosis not present

## 2022-06-25 DIAGNOSIS — C50912 Malignant neoplasm of unspecified site of left female breast: Secondary | ICD-10-CM | POA: Diagnosis not present

## 2022-06-25 DIAGNOSIS — C773 Secondary and unspecified malignant neoplasm of axilla and upper limb lymph nodes: Secondary | ICD-10-CM | POA: Diagnosis not present

## 2022-06-25 HISTORY — PX: BREAST BIOPSY: SHX20

## 2022-06-25 MED ORDER — LIDOCAINE HCL (PF) 1 % IJ SOLN
5.0000 mL | Freq: Once | INTRAMUSCULAR | Status: AC
  Administered 2022-06-25: 5 mL

## 2022-06-27 ENCOUNTER — Other Ambulatory Visit: Payer: Self-pay

## 2022-06-27 ENCOUNTER — Other Ambulatory Visit: Payer: PPO

## 2022-06-29 ENCOUNTER — Inpatient Hospital Stay: Payer: PPO

## 2022-06-29 VITALS — BP 111/71 | HR 65 | Temp 97.6°F | Resp 18

## 2022-06-29 DIAGNOSIS — D6481 Anemia due to antineoplastic chemotherapy: Secondary | ICD-10-CM

## 2022-06-29 DIAGNOSIS — Z5111 Encounter for antineoplastic chemotherapy: Secondary | ICD-10-CM | POA: Diagnosis not present

## 2022-06-29 MED ORDER — HEPARIN SOD (PORK) LOCK FLUSH 100 UNIT/ML IV SOLN
500.0000 [IU] | Freq: Once | INTRAVENOUS | Status: AC | PRN
  Administered 2022-06-29: 500 [IU]
  Filled 2022-06-29: qty 5

## 2022-06-29 MED ORDER — SODIUM CHLORIDE 0.9 % IV SOLN
Freq: Once | INTRAVENOUS | Status: AC
  Filled 2022-06-29: qty 250

## 2022-06-29 MED ORDER — SODIUM CHLORIDE 0.9 % IV SOLN
200.0000 mg | INTRAVENOUS | Status: DC
  Administered 2022-06-29: 200 mg via INTRAVENOUS
  Filled 2022-06-29: qty 200

## 2022-07-05 ENCOUNTER — Ambulatory Visit
Admission: RE | Admit: 2022-07-05 | Discharge: 2022-07-05 | Disposition: A | Discharge status: Home / Self Care | Payer: PPO | Source: Ambulatory Visit | Attending: Oncology | Admitting: Oncology

## 2022-07-05 DIAGNOSIS — C50919 Malignant neoplasm of unspecified site of unspecified female breast: Secondary | ICD-10-CM | POA: Diagnosis not present

## 2022-07-05 DIAGNOSIS — N6320 Unspecified lump in the left breast, unspecified quadrant: Secondary | ICD-10-CM | POA: Insufficient documentation

## 2022-07-05 DIAGNOSIS — C50412 Malignant neoplasm of upper-outer quadrant of left female breast: Secondary | ICD-10-CM

## 2022-07-05 DIAGNOSIS — R918 Other nonspecific abnormal finding of lung field: Secondary | ICD-10-CM | POA: Diagnosis not present

## 2022-07-05 DIAGNOSIS — I700 Atherosclerosis of aorta: Secondary | ICD-10-CM | POA: Insufficient documentation

## 2022-07-05 DIAGNOSIS — Z171 Estrogen receptor negative status [ER-]: Secondary | ICD-10-CM | POA: Insufficient documentation

## 2022-07-05 DIAGNOSIS — K5730 Diverticulosis of large intestine without perforation or abscess without bleeding: Secondary | ICD-10-CM | POA: Diagnosis not present

## 2022-07-05 LAB — GLUCOSE, CAPILLARY: Glucose-Capillary: 61 mg/dL — ABNORMAL LOW (ref 70–99)

## 2022-07-05 MED ORDER — FLUDEOXYGLUCOSE F - 18 (FDG) INJECTION
5.2000 | Freq: Once | INTRAVENOUS | Status: AC | PRN
  Administered 2022-07-05: 5.72 via INTRAVENOUS

## 2022-07-06 ENCOUNTER — Inpatient Hospital Stay: Payer: PPO

## 2022-07-06 ENCOUNTER — Ambulatory Visit: Payer: Self-pay | Admitting: General Surgery

## 2022-07-06 VITALS — BP 130/49 | HR 51 | Temp 96.8°F | Resp 18

## 2022-07-06 DIAGNOSIS — Z5111 Encounter for antineoplastic chemotherapy: Secondary | ICD-10-CM | POA: Diagnosis not present

## 2022-07-06 DIAGNOSIS — D6481 Anemia due to antineoplastic chemotherapy: Secondary | ICD-10-CM

## 2022-07-06 MED ORDER — SODIUM CHLORIDE 0.9% FLUSH
10.0000 mL | Freq: Once | INTRAVENOUS | Status: AC | PRN
  Administered 2022-07-06: 10 mL
  Filled 2022-07-06: qty 10

## 2022-07-06 MED ORDER — HEPARIN SOD (PORK) LOCK FLUSH 100 UNIT/ML IV SOLN
500.0000 [IU] | Freq: Once | INTRAVENOUS | Status: AC | PRN
  Administered 2022-07-06: 500 [IU]
  Filled 2022-07-06: qty 5

## 2022-07-06 MED ORDER — SODIUM CHLORIDE 0.9 % IV SOLN
200.0000 mg | INTRAVENOUS | Status: DC
  Administered 2022-07-06: 200 mg via INTRAVENOUS
  Filled 2022-07-06: qty 10

## 2022-07-06 MED ORDER — SODIUM CHLORIDE 0.9 % IV SOLN
Freq: Once | INTRAVENOUS | Status: AC
  Filled 2022-07-06: qty 250

## 2022-07-06 NOTE — Progress Notes (Signed)
Nutrition  RD unable to see patient today during infusion.  Infusion finished prior to RD being able to see patient.   Chart reviewed Noted surgery on 2/7 (mastectomy with sentinel lymph node biopsy)  RD available as needed  Katlin Ciszewski B. Zenia Resides, [place], [place] Registered Dietitian [phone]

## 2022-07-09 ENCOUNTER — Other Ambulatory Visit: Payer: Self-pay | Admitting: Nurse Practitioner

## 2022-07-09 ENCOUNTER — Other Ambulatory Visit: Payer: Self-pay

## 2022-07-09 ENCOUNTER — Encounter
Admission: RE | Admit: 2022-07-09 | Discharge: 2022-07-09 | Disposition: A | Discharge status: Home / Self Care | Payer: PPO | Source: Ambulatory Visit | Attending: General Surgery | Admitting: General Surgery

## 2022-07-09 HISTORY — DX: Other pulmonary embolism without acute cor pulmonale: I26.99

## 2022-07-09 HISTORY — DX: Hyperlipidemia, unspecified: E78.5

## 2022-07-09 HISTORY — DX: Acute embolism and thrombosis of unspecified deep veins of unspecified lower extremity: I82.409

## 2022-07-09 HISTORY — DX: Arteriovenous malformation of cerebral vessels: Q28.2

## 2022-07-09 NOTE — Patient Instructions (Signed)
Your procedure is scheduled on: 07/18/22 Report to  [facility] AT 7:30 AM.  [employer] is available free of charge.  Remember: Instructions that are not followed completely may result in serious medical risk, up to and including death, or upon the discretion of your surgeon and anesthesiologist your surgery may need to be rescheduled.     _X__ 1. Do not eat food of drink any liquids after midnight the night before your procedure.                 No gum chewing or hard candies.   __X__2.  On the morning of surgery brush your teeth with toothpaste and water, you                 may rinse your mouth with mouthwash if you wish.  Do not swallow any              toothpaste of mouthwash.     _X__ 3.  No Alcohol for 24 hours before or after surgery.   _X__ 4.  Do Not Smoke or use e-cigarettes For 24 Hours Prior to Your Surgery.                 Do not use any chewable tobacco products for at least 6 hours prior to                 surgery.  ____  5.  Bring all medications with you on the day of surgery if instructed.   __X__  6.  Notify your doctor if there is any change in your medical condition      (cold, fever, infections).     Do not wear jewelry, make-up, hairpins, clips or nail polish. Do not wear lotions, powders, or perfumes. NO DEODORANT Do not shave body hair 48 hours prior to surgery. Men may shave face and neck. Do not bring valuables to the hospital.    [facility] is not responsible for any belongings or valuables.  Contacts, dentures/partials or body piercings may not be worn into surgery. Bring a case for your contacts, glasses or hearing aids, a denture cup will be supplied. Leave your suitcase in the car. After surgery it may be brought to your room. For patients admitted to the hospital, discharge time is determined by your treatment team.   Patients discharged the day of surgery will need an adult driver. You will not be allowed  to drive yourself home, take an uber, lyft or taxi. You may use medical transportation such as [place] as long as you have an adult with you or waiting to assist you upon your arrival home. You must have an adult over 82 years old in the home with you for the first 24 hours after surgery    __X__ Take these medicines the morning of surgery with A SIP OF WATER:    1. buPROPion (WELLBUTRIN XL) 150 MG 24 hr tablet   2. DULoxetine (CYMBALTA) 20 MG capsule   3.   4.  5.  6.  ____ Fleet Enema (as directed)   __X__ Use CHG Soap/SAGE wipes as directed  MAY PICK UP AT  OUR OFFICE [address] PRE ADMIT TESTING  ____ Use inhalers on the day of surgery  ____ Stop metformin/Janumet/Farxiga 2 days prior to surgery    ____ Take 1/2 of usual insulin dose the night before surgery. No insulin  the morning          of surgery.   ____ Stop Blood Thinners Coumadin/Plavix/Xarelto/Pleta/Pradaxa/Eliquis/Effient/Aspirin  on   Or contact your Surgeon, Cardiologist or Medical Doctor regarding  ability to stop your blood thinners  __X__ Stop Anti-inflammatories 7 days before surgery such as Advil, Ibuprofen, Motrin,  BC or Goodies Powder, Naprosyn, Naproxen, Aleve, Aspirin    MAY SUBSTITUTE TYLENOL IF NEEDED   __X__ Stop all herbals and supplements, fish oil or vitamins  until after surgery.    ____ Bring C-Pap to the hospital.    Preparing the Skin Before Surgery     To help prevent the risk of infection at your surgical site, we are now providing you with rinse-free Sage 2% Chlorhexidine Gluconate (CHG) disposable wipes.  Chlorhexidine Gluconate (CHG) Soap  o An antiseptic cleaner that kills germs and bonds with the skin to continue killing germs even after washing  o Used for showering the night before surgery and morning of surgery  The night before surgery: Shower or bathe with warm water. Do not apply perfume, lotions, powders. Wait one hour  after shower. Skin should be dry and cool. Open Sage wipe package - 6 disposable cloths are inside. Wipe body using one cloth for the right arm, one cloth for the left arm, one cloth for the right leg, one cloth for the left leg, one cloth for the chest/abdomen area (do not use on breasts if breast feeding), and one cloth for the back. 5. Do not rinse, allow to dry. 6. Skin may fee "tacky" for several minutes. 7. Dress in clean clothes. 8. Place clean sheets on your bed and do not sleep with pets.  REPEAT ABOVE ON THE MORNING OF SURGERY PRIOR TO ARRIVING TO [place].

## 2022-07-13 ENCOUNTER — Other Ambulatory Visit: Payer: Self-pay | Admitting: *Deleted

## 2022-07-14 MED ORDER — DULOXETINE HCL 20 MG PO CPEP: 20.0000 mg | ORAL_CAPSULE | Freq: Every day | ORAL | 3 refills | Status: DC

## 2022-07-18 ENCOUNTER — Ambulatory Visit: Payer: PPO | Admitting: Urgent Care

## 2022-07-18 ENCOUNTER — Encounter
Admission: RE | Disposition: A | Discharge status: Home / Self Care | Payer: Self-pay | Source: Home / Self Care | Attending: General Surgery

## 2022-07-18 ENCOUNTER — Observation Stay
Admission: RE | Admit: 2022-07-18 | Discharge: 2022-07-19 | Disposition: A | Discharge status: Home / Self Care | Payer: PPO | Attending: General Surgery | Admitting: General Surgery

## 2022-07-18 ENCOUNTER — Encounter: Payer: Self-pay | Admitting: General Surgery

## 2022-07-18 ENCOUNTER — Other Ambulatory Visit: Payer: Self-pay

## 2022-07-18 ENCOUNTER — Ambulatory Visit
Admission: RE | Admit: 2022-07-18 | Discharge: 2022-07-18 | Disposition: A | Discharge status: Home / Self Care | Payer: PPO | Source: Ambulatory Visit | Attending: General Surgery | Admitting: General Surgery

## 2022-07-18 ENCOUNTER — Observation Stay
Admission: RE | Admit: 2022-07-18 | Discharge: 2022-07-18 | Disposition: A | Discharge status: Home / Self Care | Payer: PPO | Source: Ambulatory Visit | Attending: General Surgery | Admitting: General Surgery

## 2022-07-18 DIAGNOSIS — C50412 Malignant neoplasm of upper-outer quadrant of left female breast: Secondary | ICD-10-CM | POA: Diagnosis not present

## 2022-07-18 DIAGNOSIS — C50912 Malignant neoplasm of unspecified site of left female breast: Secondary | ICD-10-CM

## 2022-07-18 DIAGNOSIS — Z85828 Personal history of other malignant neoplasm of skin: Secondary | ICD-10-CM | POA: Diagnosis not present

## 2022-07-18 DIAGNOSIS — C50919 Malignant neoplasm of unspecified site of unspecified female breast: Secondary | ICD-10-CM | POA: Diagnosis present

## 2022-07-18 DIAGNOSIS — N6091 Unspecified benign mammary dysplasia of right breast: Secondary | ICD-10-CM

## 2022-07-18 DIAGNOSIS — D241 Benign neoplasm of right breast: Secondary | ICD-10-CM | POA: Diagnosis not present

## 2022-07-18 DIAGNOSIS — Z79899 Other long term (current) drug therapy: Secondary | ICD-10-CM | POA: Insufficient documentation

## 2022-07-18 DIAGNOSIS — N6081 Other benign mammary dysplasias of right breast: Secondary | ICD-10-CM | POA: Diagnosis not present

## 2022-07-18 DIAGNOSIS — Z87891 Personal history of nicotine dependence: Secondary | ICD-10-CM | POA: Diagnosis not present

## 2022-07-18 DIAGNOSIS — G309 Alzheimer's disease, unspecified: Secondary | ICD-10-CM | POA: Diagnosis not present

## 2022-07-18 DIAGNOSIS — Z86711 Personal history of pulmonary embolism: Secondary | ICD-10-CM | POA: Insufficient documentation

## 2022-07-18 DIAGNOSIS — Z86718 Personal history of other venous thrombosis and embolism: Secondary | ICD-10-CM | POA: Insufficient documentation

## 2022-07-18 DIAGNOSIS — I10 Essential (primary) hypertension: Secondary | ICD-10-CM | POA: Diagnosis not present

## 2022-07-18 DIAGNOSIS — C773 Secondary and unspecified malignant neoplasm of axilla and upper limb lymph nodes: Secondary | ICD-10-CM | POA: Diagnosis not present

## 2022-07-18 DIAGNOSIS — Z171 Estrogen receptor negative status [ER-]: Secondary | ICD-10-CM | POA: Diagnosis not present

## 2022-07-18 HISTORY — PX: AXILLARY SENTINEL NODE BIOPSY: SHX5738

## 2022-07-18 HISTORY — PX: MASTECTOMY W/ SENTINEL NODE BIOPSY: SHX2001

## 2022-07-18 SURGERY — MASTECTOMY WITH SENTINEL LYMPH NODE BIOPSY
Anesthesia: General | Site: Breast | Laterality: Left

## 2022-07-18 MED ORDER — TECHNETIUM TC 99M TILMANOCEPT KIT
1.0200 | PACK | Freq: Once | INTRAVENOUS | Status: AC
  Administered 2022-07-18: 1.02 via INTRADERMAL

## 2022-07-18 MED ORDER — BUPROPION HCL ER (XL) 150 MG PO TB24
150.0000 mg | ORAL_TABLET | ORAL | Status: DC
  Administered 2022-07-19: 150 mg via ORAL
  Filled 2022-07-18: qty 1

## 2022-07-18 MED ORDER — LACTATED RINGERS IV SOLN: INTRAVENOUS | Status: DC

## 2022-07-18 MED ORDER — CEFAZOLIN SODIUM-DEXTROSE 2-4 GM/100ML-% IV SOLN
2.0000 g | INTRAVENOUS | Status: AC
  Administered 2022-07-18: 2 g via INTRAVENOUS

## 2022-07-18 MED ORDER — METHYLENE BLUE 1 % INJ SOLN
INTRAVENOUS | Status: AC
  Filled 2022-07-18: qty 10

## 2022-07-18 MED ORDER — BUPIVACAINE LIPOSOME 1.3 % IJ SUSP
INTRAMUSCULAR | Status: AC
  Filled 2022-07-18: qty 20

## 2022-07-18 MED ORDER — ROCURONIUM BROMIDE 100 MG/10ML IV SOLN
INTRAVENOUS | Status: DC | PRN
  Administered 2022-07-18: 40 mg via INTRAVENOUS
  Administered 2022-07-18: 10 mg via INTRAVENOUS

## 2022-07-18 MED ORDER — SODIUM CHLORIDE (PF) 0.9 % IJ SOLN
INTRAMUSCULAR | Status: AC
  Filled 2022-07-18: qty 50

## 2022-07-18 MED ORDER — ROCURONIUM BROMIDE 10 MG/ML (PF) SYRINGE
PREFILLED_SYRINGE | INTRAVENOUS | Status: AC
  Filled 2022-07-18: qty 10

## 2022-07-18 MED ORDER — OXYCODONE HCL 5 MG PO TABS: 5.0000 mg | ORAL_TABLET | Freq: Once | ORAL | Status: DC | PRN

## 2022-07-18 MED ORDER — ESMOLOL HCL 100 MG/10ML IV SOLN
INTRAVENOUS | Status: AC
  Filled 2022-07-18: qty 10

## 2022-07-18 MED ORDER — ACETAMINOPHEN 10 MG/ML IV SOLN
INTRAVENOUS | Status: AC
  Filled 2022-07-18: qty 100

## 2022-07-18 MED ORDER — STERILE WATER FOR IRRIGATION IR SOLN
Status: DC | PRN
  Administered 2022-07-18: 1000 mL

## 2022-07-18 MED ORDER — ONDANSETRON 4 MG PO TBDP: 4.0000 mg | ORAL_TABLET | Freq: Four times a day (QID) | ORAL | Status: DC | PRN

## 2022-07-18 MED ORDER — ONDANSETRON HCL 4 MG/2ML IJ SOLN: 4.0000 mg | Freq: Four times a day (QID) | INTRAMUSCULAR | Status: DC | PRN

## 2022-07-18 MED ORDER — METHYLENE BLUE 1 % INJ SOLN
INTRAVENOUS | Status: DC | PRN
  Administered 2022-07-18: 10 mL

## 2022-07-18 MED ORDER — FENTANYL CITRATE (PF) 100 MCG/2ML IJ SOLN
INTRAMUSCULAR | Status: AC
  Filled 2022-07-18: qty 2

## 2022-07-18 MED ORDER — ONDANSETRON HCL 4 MG/2ML IJ SOLN
INTRAMUSCULAR | Status: DC | PRN
  Administered 2022-07-18: 4 mg via INTRAVENOUS

## 2022-07-18 MED ORDER — FAMOTIDINE 20 MG PO TABS
ORAL_TABLET | ORAL | Status: AC
  Administered 2022-07-18: 20 mg via ORAL
  Filled 2022-07-18: qty 1

## 2022-07-18 MED ORDER — DEXMEDETOMIDINE HCL IN NACL 200 MCG/50ML IV SOLN
INTRAVENOUS | Status: DC | PRN
  Administered 2022-07-18: 4 ug via INTRAVENOUS

## 2022-07-18 MED ORDER — ACETAMINOPHEN 10 MG/ML IV SOLN
INTRAVENOUS | Status: DC | PRN
  Administered 2022-07-18: 1000 mg via INTRAVENOUS

## 2022-07-18 MED ORDER — BUPIVACAINE-EPINEPHRINE (PF) 0.5% -1:200000 IJ SOLN
INTRAMUSCULAR | Status: AC
  Filled 2022-07-18: qty 30

## 2022-07-18 MED ORDER — GLYCOPYRROLATE 0.2 MG/ML IJ SOLN
INTRAMUSCULAR | Status: DC | PRN
  Administered 2022-07-18: .1 mg via INTRAVENOUS

## 2022-07-18 MED ORDER — SUGAMMADEX SODIUM 200 MG/2ML IV SOLN
INTRAVENOUS | Status: DC | PRN
  Administered 2022-07-18: 83.4 mg via INTRAVENOUS

## 2022-07-18 MED ORDER — PHENYLEPHRINE 80 MCG/ML (10ML) SYRINGE FOR IV PUSH (FOR BLOOD PRESSURE SUPPORT)
PREFILLED_SYRINGE | INTRAVENOUS | Status: DC | PRN
  Administered 2022-07-18 (×3): 80 ug via INTRAVENOUS
  Administered 2022-07-18: 160 ug via INTRAVENOUS
  Administered 2022-07-18 (×3): 80 ug via INTRAVENOUS

## 2022-07-18 MED ORDER — LIDOCAINE HCL (CARDIAC) PF 100 MG/5ML IV SOSY
PREFILLED_SYRINGE | INTRAVENOUS | Status: DC | PRN
  Administered 2022-07-18: 60 mg via INTRAVENOUS

## 2022-07-18 MED ORDER — CHLORHEXIDINE GLUCONATE 0.12 % MT SOLN
OROMUCOSAL | Status: AC
  Administered 2022-07-18: 15 mL via OROMUCOSAL
  Filled 2022-07-18: qty 15

## 2022-07-18 MED ORDER — DEXAMETHASONE SODIUM PHOSPHATE 10 MG/ML IJ SOLN
INTRAMUSCULAR | Status: AC
  Filled 2022-07-18: qty 1

## 2022-07-18 MED ORDER — LIDOCAINE HCL (PF) 2 % IJ SOLN
INTRAMUSCULAR | Status: AC
  Filled 2022-07-18: qty 5

## 2022-07-18 MED ORDER — ESMOLOL HCL 100 MG/10ML IV SOLN
INTRAVENOUS | Status: DC | PRN
  Administered 2022-07-18 (×2): 10 mg via INTRAVENOUS

## 2022-07-18 MED ORDER — PANTOPRAZOLE SODIUM 40 MG IV SOLR: 40.0000 mg | Freq: Every day | INTRAVENOUS | Status: DC

## 2022-07-18 MED ORDER — FENTANYL CITRATE (PF) 100 MCG/2ML IJ SOLN
INTRAMUSCULAR | Status: DC | PRN
  Administered 2022-07-18: 50 ug via INTRAVENOUS

## 2022-07-18 MED ORDER — ENOXAPARIN SODIUM 40 MG/0.4ML IJ SOSY
40.0000 mg | PREFILLED_SYRINGE | INTRAMUSCULAR | Status: DC
  Administered 2022-07-19: 40 mg via SUBCUTANEOUS

## 2022-07-18 MED ORDER — MORPHINE SULFATE (PF) 4 MG/ML IV SOLN: 4.0000 mg | INTRAVENOUS | Status: DC | PRN

## 2022-07-18 MED ORDER — PROPOFOL 10 MG/ML IV BOLUS
INTRAVENOUS | Status: AC
  Filled 2022-07-18: qty 20

## 2022-07-18 MED ORDER — PANTOPRAZOLE SODIUM 40 MG IV SOLR
INTRAVENOUS | Status: AC
  Administered 2022-07-18: 40 mg via INTRAVENOUS
  Filled 2022-07-18: qty 10

## 2022-07-18 MED ORDER — ORAL CARE MOUTH RINSE: 15.0000 mL | Freq: Once | OROMUCOSAL | Status: AC

## 2022-07-18 MED ORDER — ONDANSETRON HCL 4 MG/2ML IJ SOLN
INTRAMUSCULAR | Status: AC
  Filled 2022-07-18: qty 2

## 2022-07-18 MED ORDER — DEXMEDETOMIDINE HCL IN NACL 80 MCG/20ML IV SOLN
INTRAVENOUS | Status: AC
  Filled 2022-07-18: qty 20

## 2022-07-18 MED ORDER — PROPOFOL 10 MG/ML IV BOLUS
INTRAVENOUS | Status: DC | PRN
  Administered 2022-07-18: 100 mg via INTRAVENOUS

## 2022-07-18 MED ORDER — HEPARIN SODIUM (PORCINE) 5000 UNIT/ML IJ SOLN
INTRAMUSCULAR | Status: AC
  Administered 2022-07-18: 5000 [IU] via SUBCUTANEOUS
  Filled 2022-07-18: qty 1

## 2022-07-18 MED ORDER — FENTANYL CITRATE (PF) 100 MCG/2ML IJ SOLN: 25.0000 ug | INTRAMUSCULAR | Status: DC | PRN

## 2022-07-18 MED ORDER — DEXAMETHASONE SODIUM PHOSPHATE 10 MG/ML IJ SOLN
INTRAMUSCULAR | Status: DC | PRN
  Administered 2022-07-18: 8 mg via INTRAVENOUS

## 2022-07-18 MED ORDER — OXYCODONE HCL 5 MG/5ML PO SOLN: 5.0000 mg | Freq: Once | ORAL | Status: DC | PRN

## 2022-07-18 MED ORDER — EPHEDRINE SULFATE (PRESSORS) 50 MG/ML IJ SOLN
INTRAMUSCULAR | Status: DC | PRN
  Administered 2022-07-18: 5 mg via INTRAVENOUS
  Administered 2022-07-18: 10 mg via INTRAVENOUS
  Administered 2022-07-18: 5 mg via INTRAVENOUS

## 2022-07-18 MED ORDER — CHLORHEXIDINE GLUCONATE 0.12 % MT SOLN: 15.0000 mL | Freq: Once | OROMUCOSAL | Status: AC

## 2022-07-18 MED ORDER — TRAMADOL HCL 50 MG PO TABS
ORAL_TABLET | ORAL | Status: AC
  Administered 2022-07-18: 50 mg via ORAL
  Filled 2022-07-18: qty 1

## 2022-07-18 MED ORDER — TECHNETIUM TC 99M TILMANOCEPT KIT
1.0300 | PACK | Freq: Once | INTRAVENOUS | Status: AC | PRN
  Administered 2022-07-18: 1.03 via INTRADERMAL

## 2022-07-18 MED ORDER — TRAMADOL HCL 50 MG PO TABS
50.0000 mg | ORAL_TABLET | Freq: Four times a day (QID) | ORAL | Status: DC | PRN
  Administered 2022-07-19: 50 mg via ORAL

## 2022-07-18 MED ORDER — HEPARIN SODIUM (PORCINE) 5000 UNIT/ML IJ SOLN: 5000.0000 [IU] | Freq: Once | INTRAMUSCULAR | Status: AC

## 2022-07-18 MED ORDER — CEFAZOLIN SODIUM-DEXTROSE 2-4 GM/100ML-% IV SOLN
INTRAVENOUS | Status: AC
  Filled 2022-07-18: qty 100

## 2022-07-18 MED ORDER — FAMOTIDINE 20 MG PO TABS: 20.0000 mg | ORAL_TABLET | Freq: Once | ORAL | Status: AC

## 2022-07-18 MED ORDER — DULOXETINE HCL 20 MG PO CPEP
20.0000 mg | ORAL_CAPSULE | Freq: Every day | ORAL | Status: DC
  Administered 2022-07-19: 20 mg via ORAL
  Filled 2022-07-18 (×2): qty 1

## 2022-07-18 SURGICAL SUPPLY — 43 items
BINDER BREAST MEDIUM (GAUZE/BANDAGES/DRESSINGS) IMPLANT
BULB RESERV EVAC DRAIN JP 100C (MISCELLANEOUS) IMPLANT
CHLORAPREP W/TINT 26 (MISCELLANEOUS) ×2 IMPLANT
COVER PROBE GAMMA FINDER SLV (MISCELLANEOUS) ×2 IMPLANT
DERMABOND ADVANCED .7 DNX12 (GAUZE/BANDAGES/DRESSINGS) ×2 IMPLANT
DRAIN CHANNEL JP 15F RND 16 (MISCELLANEOUS) ×4 IMPLANT
DRAIN CHANNEL JP 19F (MISCELLANEOUS) IMPLANT
DRAPE LAPAROTOMY TRNSV 106X77 (MISCELLANEOUS) ×2 IMPLANT
DRSG GAUZE FLUFF 36X18 (GAUZE/BANDAGES/DRESSINGS) ×2 IMPLANT
ELECT REM PT RETURN 9FT ADLT (ELECTROSURGICAL) ×2
ELECTRODE REM PT RTRN 9FT ADLT (ELECTROSURGICAL) ×2 IMPLANT
GAUZE 4X4 16PLY ~~LOC~~+RFID DBL (SPONGE) ×2 IMPLANT
GAUZE SPONGE 4X4 12PLY STRL (GAUZE/BANDAGES/DRESSINGS) ×2 IMPLANT
GLOVE BIO SURGEON STRL SZ 6.5 (GLOVE) ×2 IMPLANT
GLOVE BIOGEL PI IND STRL 6.5 (GLOVE) ×2 IMPLANT
GOWN STRL REUS W/ TWL LRG LVL3 (GOWN DISPOSABLE) ×4 IMPLANT
GOWN STRL REUS W/TWL LRG LVL3 (GOWN DISPOSABLE) ×12
KIT TURNOVER KIT A (KITS) ×2 IMPLANT
LABEL OR SOLS (LABEL) ×2 IMPLANT
MANIFOLD NEPTUNE II (INSTRUMENTS) ×2 IMPLANT
MARGIN MAP 10MM (MISCELLANEOUS) ×2 IMPLANT
PACK BASIN MAJOR ARMC (MISCELLANEOUS) ×2 IMPLANT
PAD ABD DERMACEA PRESS 5X9 (GAUZE/BANDAGES/DRESSINGS) ×2 IMPLANT
SET LOCALIZER 20 PROBE US (MISCELLANEOUS) IMPLANT
SPONGE DRAIN TRACH 4X4 STRL 2S (GAUZE/BANDAGES/DRESSINGS) IMPLANT
SPONGE T-LAP 18X18 ~~LOC~~+RFID (SPONGE) ×4 IMPLANT
SUT ETHILON 3-0 FS-10 30 BLK (SUTURE) ×2
SUT ETHILON 4-0 (SUTURE) ×2
SUT ETHILON 4-0 FS2 18XMFL BLK (SUTURE) ×2
SUT MNCRL 4-0 (SUTURE) ×4
SUT MNCRL 4-0 27XMFL (SUTURE) ×4
SUT SILK 2 0 SH (SUTURE) IMPLANT
SUT SILK 3-0 (SUTURE) ×2 IMPLANT
SUT VIC AB 3-0 54X BRD REEL (SUTURE) ×2 IMPLANT
SUT VIC AB 3-0 BRD 54 (SUTURE) ×2
SUT VIC AB 3-0 SH 27 (SUTURE) ×2
SUT VIC AB 3-0 SH 27X BRD (SUTURE) ×4 IMPLANT
SUT VIC AB 3-0 SH 8-18 (SUTURE) ×2 IMPLANT
SUTURE EHLN 3-0 FS-10 30 BLK (SUTURE) ×2 IMPLANT
SUTURE ETHLN 4-0 FS2 18XMF BLK (SUTURE) IMPLANT
SUTURE MNCRL 4-0 27XMF (SUTURE) ×4 IMPLANT
TRAP FLUID SMOKE EVACUATOR (MISCELLANEOUS) ×2 IMPLANT
WATER STERILE IRR 500ML POUR (IV SOLUTION) ×2 IMPLANT

## 2022-07-18 NOTE — H&P (Signed)
PATIENT PROFILE: Patricia Miller is a 77 y.o. female who presents to the Clinic for evaluation of surgical management of breast cancer.  PCP: Patricia Pax, MD  HISTORY OF PRESENT ILLNESS: Patricia Miller was previously evaluated for left breast cancer. 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 She was evaluated by medical oncology and recommendation was to proceed with neoadjuvant chemotherapy. She has completed the neoadjuvant chemotherapy. She had a left breast ultrasound on March 28, 2022 that shows decreasing size of the renal mass. The residual mass measures about 2.9 cm (originally 3.6 cm). The left axillary lymph node also shows significant decrease in size as well. I personally evaluated the images of these breast ultrasound. She tolerated chemotherapy with fatigue and neuropathy mostly at the end of the course. She was also found with atypical ductal hyperplasia of the right breast at the retroareolar area.  PROBLEM LIST: Problem List Date Reviewed: 05/16/2022  Noted Premature ventricular contractions 12/05/2020 Bronchiectasis without complication (CMS-HCC) 3/41/9379 Overview CT 2/22    Alzheimer's dementia without behavioral disturbance (CMS-HCC) 06/29/2020 Overview Aricept    Acute pulmonary embolism with acute cor pulmonale (CMS-HCC) 03/30/2020 Overview 9/21, extensive left DVT, IVC filter, bilateral thrombectomy Post hysterectomy plus estrogen    Acute deep vein thrombosis (DVT) of femoral vein of left lower extremity (CMS-HCC) 03/30/2020 Overview Associated with PE, 9/21    Pulmonary nodule 03/30/2020 Overview 1.1 X1.2 right midlung, CT 9/21    Hyperlipidemia, mixed 12/15/2019 History of squamous cell carcinoma 02/03/2018 Overview Chest wall    Medicare annual wellness visit, initial 02/03/2018 Overview 8/19, 5/20, 7/21, 6/23   Tubular adenoma 11/29/2016 Overview 20121, 9/18  normal    Hormone replacement therapy (postmenopausal) 03/22/2015 Overview Desires to continue, vitamin D 66 2017    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     GENERAL REVIEW OF SYSTEMS:  General ROS: negative for - chills, fatigue, fever, weight gain or weight loss Allergy and Immunology ROS: negative for - hives Hematological and Lymphatic ROS: negative for - bleeding problems or bruising, negative for palpable nodes Endocrine ROS: negative for - heat or cold intolerance, hair changes Respiratory ROS: negative for - cough, shortness of breath or wheezing Cardiovascular ROS: no chest pain or palpitations GI ROS: negative for nausea, vomiting, abdominal pain, diarrhea, constipation Musculoskeletal ROS: negative for - joint swelling or muscle pain Neurological ROS: negative for - confusion, syncope Dermatological ROS: negative for pruritus and rash Psychiatric: negative for anxiety, depression, difficulty sleeping and memory loss  MEDICATIONS: Current Outpatient Medications Medication Sig Dispense Refill buPROPion (WELLBUTRIN XL) 150 MG XL tablet Take 1 tablet (150 mg total) by mouth once daily 90 tablet 3 cetirizine (ZYRTEC) 10 MG tablet Take 10 mg by mouth as needed dexAMETHasone (DECADRON) 4 MG tablet Take by mouth lidocaine-prilocaine (EMLA) cream Apply 1 Application topically as needed LORazepam (ATIVAN) 0.5 MG tablet Take by mouth ondansetron (ZOFRAN) 8 MG tablet Take 8 mg by mouth every 8 (eight) hours as needed prochlorperazine (COMPAZINE) 10 MG tablet Take by mouth  No current facility-administered medications for this visit.  ALLERGIES: Patient has no known allergies.  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 Chickenpox Fibroid Hypertension Mumps PMB (postmenopausal bleeding) Postmenopausal  bleeding Postmenopausal estrogen deficiency 10/22/2013 Squamous cell carcinoma 11/08/2013 Chest wall  PAST SURGICAL HISTORY: Past Surgical History: Procedure Laterality Date COLONOSCOPY 08/01/2003 FH Colon Polyps (  Mother) COLONOSCOPY 03/15/2011 Adenomatous Polyps, FH Colon Polyps (Mother): CBF 03/2016; Recall Ltr mailed 01/20/2016 (dw) COLONOSCOPY 02/20/2017 PH Adenomatous Polyps, FH Colon Polyps (Mother): CBF 02/2022 btl DILATION AND CURETTAGE OF UTERUS HYSTERECTOMY TLH, BS HYSTEROSCOPY TUBAL LIGATION   FAMILY HISTORY: Family History Problem Relation Age of Onset Diverticulosis Mother Colon polyps Mother Cancer Sister Colon cancer Maternal Aunt No Known Problems Father   SOCIAL HISTORY: Social History  Socioeconomic History Marital status: Divorced Tobacco Use Smoking status: Former Types: Cigarettes Quit date: 10/23/1992 Years since quitting: 29.6 Smokeless tobacco: Never Vaping Use Vaping Use: Never used Substance and Sexual Activity Alcohol use: No Drug use: No Sexual activity: Not Currently Partners: Male Birth control/protection: Post-menopausal Social History Narrative Lives alone. Feels safe.  PHYSICAL EXAM: Vitals: 06/21/22 1110 BP: 111/64 Pulse: (!) 46  Body mass index is 20.06 kg/m. Weight: (!) 43.5 kg (96 lb)  GENERAL: Alert, active, oriented x3  HEENT: Pupils equal reactive to light. Extraocular movements are intact. Sclera clear. Palpebral conjunctiva normal red color.Pharynx clear.  NECK: Supple with no palpable mass and no adenopathy.  LUNGS: Sound clear with no rales rhonchi or wheezes.  HEART: Regular rhythm S1 and S2 without murmur.  BREAST: Both breasts examined in the sitting and supine position. There was a palpable mass at the 3:00 of the left breast. There was also palpable left breast adenopathy. No palpable masses or lymph nodes on the right breast.  ABDOMEN: Soft and depressible, nontender with no palpable mass, no  hepatomegaly.  EXTREMITIES: Well-developed well-nourished symmetrical with no dependent edema.  NEUROLOGICAL: Awake alert oriented, facial expression symmetrical, moving all extremities.  REVIEW OF DATA: I have reviewed the following data today: No visits with results within 3 Month(s) from this visit. Latest known visit with results is: Office Visit on 11/15/2021 Component Date Value WBC (White Blood Cell Co* 11/15/2021 6.3 RBC (Red Blood Cell Coun* 11/15/2021 5.04 Hemoglobin 11/15/2021 15.8 (H) Hematocrit 11/15/2021 47.3 (H) MCV (Mean Corpuscular Vo* 11/15/2021 93.8 MCH (Mean Corpuscular He* 11/15/2021 31.3 (H) MCHC (Mean Corpuscular H* 11/15/2021 33.4 Platelet Count 11/15/2021 201 RDW-CV (Red Cell Distrib* 11/15/2021 12.9 MPV (Mean Platelet Volum* 11/15/2021 10.9 Neutrophils 11/15/2021 4.55 Lymphocytes 11/15/2021 1.23 Monocytes 11/15/2021 0.39 Eosinophils 11/15/2021 0.08 Basophils 11/15/2021 0.04 Neutrophil % 11/15/2021 72.2 (H) Lymphocyte % 11/15/2021 19.5 Monocyte % 11/15/2021 6.2 Eosinophil % 11/15/2021 1.3 Basophil% 11/15/2021 0.6 Immature Granulocyte % 11/15/2021 0.2 Immature Granulocyte Cou* 11/15/2021 0.01 Glucose 11/15/2021 81 Sodium 11/15/2021 143 Potassium 11/15/2021 4.1 Chloride 11/15/2021 107 Carbon Dioxide (CO2) 11/15/2021 27.1 Urea Nitrogen (BUN) 11/15/2021 12 Creatinine 11/15/2021 1.0 Glomerular Filtration Ra* 11/15/2021 54 (L) Calcium 11/15/2021 9.5 AST 11/15/2021 22 ALT 11/15/2021 16 Alk Phos (alkaline Phosp* 11/15/2021 84 Albumin 11/15/2021 4.2 Bilirubin, Total 11/15/2021 1.5 (H) Protein, Total 11/15/2021 6.8 A/G Ratio 11/15/2021 1.6 Cholesterol, Total 11/15/2021 159 Triglyceride 11/15/2021 61 HDL (High Density Lipopr* 11/15/2021 58.2 LDL Calculated 11/15/2021 89 VLDL Cholesterol 11/15/2021 12 Cholesterol/HDL Ratio 11/15/2021 2.7 Vitamin B12 11/15/2021 834 Color 11/15/2021 Yellow Clarity 11/15/2021 Clear Specific Gravity 11/15/2021  >=1.030 pH, Urine 11/15/2021 5.5 Protein, Urinalysis 11/15/2021 Negative Glucose, Urinalysis 11/15/2021 Negative Ketones, Urinalysis 11/15/2021 Trace (!) Blood, Urinalysis 11/15/2021 Small (!) Nitrite, Urinalysis 11/15/2021 Negative Leukocyte Esterase, Urin* 11/15/2021 Moderate (!) White Blood Cells, Urina* 11/15/2021 0-3 Red Blood Cells, Urinaly* 11/15/2021 4-10 (!) Bacteria, Urinalysis 11/15/2021 Rare (!) Squamous Epithelial Cell* 11/15/2021 Few Crystals, Urinalysis 11/15/2021 Few (!) Thyroid Stimulating Horm* 11/15/2021 2.201 Hemoglobin A1C 11/15/2021 5.4 Average Blood Glucose (C* 11/15/2021 108   ASSESSMENT: Ms. Pincus is a 77 y.o. female  presenting for consultation for surgical management of breast cancer.  Patient here after completion of neoadjuvant chemotherapy. She came for discussion of surgical alternative for the treatment of breast cancer. Patient was oriented again about the pathology results including the left breast invasive mammary carcinoma of the right breast atypical hyperplasia. Surgical alternatives were discussed with patient including partial vs total mastectomy. Surgical technique and post operative care was discussed with patient. Risk of surgery was discussed with patient including but not limited to: wound infection, seroma, hematoma, brachial plexopathy, mondor's disease (thrombosis of small veins of breast), chronic wound pain, breast lymphedema, altered sensation to the nipple and cosmesis among others.  Patient would like to proceed with bilateral breast mastectomy. I discussed with patient doing also sentinel node biopsy on bilateral axillary area. Discussed with patient possibility of complete axillary node dissection of the left breast if there is still residual disease. Patient endorses she understood the risk and the benefit and agreed to proceed with surgery.  Breast cancer metastasized to axillary lymph node, left (CMS-HCC) [C50.912,  C77.3]  PLAN: Bilateral total mastectomy with bilateral sentinel lymph node biopsy ([id number], [id number], [id number]) RF tagged left axillary lymph node excision Avoid aspirin or blood thinner 5 days before the procedure Contact us if you have any concern.  Patient verbalized understanding, all questions were answered, and were agreeable with the plan outlined above.  Herbert Pun, MD

## 2022-07-18 NOTE — Anesthesia Preprocedure Evaluation (Signed)
Anesthesia Evaluation  Patient identified by MRN, date of birth, ID band Patient awake    Reviewed: Allergy & Precautions, NPO status , Patient's Chart, lab work & pertinent test results  History of Anesthesia Complications Negative for: history of anesthetic complications  Airway Mallampati: III  TM Distance: >3 FB Neck ROM: full    Dental  (+) Dental Advidsory Given, Poor Dentition   Pulmonary neg pulmonary ROS, neg shortness of breath, neg COPD, former smoker   Pulmonary exam normal        Cardiovascular hypertension, (-) Past MI and (-) CABG negative cardio ROS Normal cardiovascular exam     Neuro/Psych  PSYCHIATRIC DISORDERS Anxiety     negative neurological ROS     GI/Hepatic negative GI ROS, Neg liver ROS,,,  Endo/Other  negative endocrine ROS    Renal/GU      Musculoskeletal   Abdominal   Peds  Hematology negative hematology ROS (+)   Anesthesia Other Findings Past Medical History: No date: Anemia No date: Anxiety No date: Arthritis     Comment:  NECK No date: AVM (arteriovenous malformation) brain No date: Breast cancer ([place]) No date: Cancer ([place])     Comment:  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) No date: Dementia (HCC) No date: DVT (deep venous thrombosis) (HCC) No date: Family history of adverse reaction to anesthesia     Comment:  niece has to come out of anesthesia slow No date: HLD (hyperlipidemia) No date: Hypertension No date: PE (pulmonary thromboembolism) ([place]) No date: Pneumonia  Past Surgical History: No date: ABDOMINAL HYSTERECTOMY 2015: BRAIN SURGERY     Comment:  DURAL AV FISTULA ([facility]) 12/14/2021: BREAST BIOPSY; Left     Comment:  [place] Bx 3:00 7 CMFN, Coil Clip, Path Pending 12/14/2021: BREAST BIOPSY; Left     Comment:  [place] Bx 10:00 2 CMFN, Venus Clip, path pending 12/14/2021: breast biopsy; Left     Comment:  [place] Axille, Hydromarker  (butterfly). path pending 12/19/2021: BREAST BIOPSY; Right     Comment:  Stereo bx-calcs, "COIL" clip-path pending 12/19/2021: BREAST BIOPSY; Right     Comment:  u/s bx-mass, 3:00, retroareolar, "HEART" clip-path               pending 06/25/2022: BREAST BIOPSY; Left     Comment:  [place] LT RADIO FREQUENCY TAG LOC US GUIDE 06/25/2022               [facility]-MAMMOGRAPHY No date: COLONOSCOPY 02/20/2017: COLONOSCOPY WITH PROPOFOL; N/A     Comment:  Procedure: COLONOSCOPY WITH PROPOFOL;  Surgeon: Manya Silvas, MD;  Location: [facility] ENDOSCOPY;  Service:               Endoscopy;  Laterality: N/A; 07/29/2015: HYSTEROSCOPY WITH D & C; N/A     Comment:  Procedure: DILATATION AND CURETTAGE /HYSTEROSCOPY;                Surgeon: Benjaman Kindler, MD;  Location: [facility] ORS;                Service: Gynecology;  Laterality: N/A; 01/24/2018: HYSTEROSCOPY WITH D & C; N/A     Comment:  Procedure: DILATATION AND CURETTAGE /HYSTEROSCOPY;                Surgeon: Benjaman Kindler, MD;  Location: [facility] ORS;  Service: Gynecology;  Laterality: N/A; 01/24/2018: LAPAROSCOPY; N/A     Comment:  Procedure: LAPAROSCOPY DIAGNOSTIC;  Surgeon: Benjaman Kindler, MD;  Location: [facility] ORS;  Service: Gynecology;                Laterality: N/A; 02/29/2020: PERIPHERAL VASCULAR THROMBECTOMY; N/A     Comment:  Procedure: PERIPHERAL VASCULAR THROMBECTOMY /               THROMBOLYSIS WITH POSSIBLE PULMONARY THROMBECTOMY /               THROMBOLYSIS;  Surgeon: Algernon Huxley, MD;  Location: [place] [facility];  Service: Cardiovascular;  Laterality:               N/A; 01/08/2022: PORTACATH PLACEMENT; N/A     Comment:  Procedure: INSERTION PORT-A-CATH;  Surgeon:               Herbert Pun, MD;  Location: [facility] ORS;  Service:              General;  Laterality: N/A; No date: TONSILLECTOMY     Comment:  AGE 77 12/28/2019: TOTAL LAPAROSCOPIC HYSTERECTOMY WITH BILATERAL SALPINGO   OOPHORECTOMY; Bilateral     Comment:  Procedure: TOTAL LAPAROSCOPIC HYSTERECTOMY WITH               BILATERAL SALPINGO OOPHORECTOMY;  Surgeon: Benjaman Kindler, MD;  Location: [facility] ORS;  Service: Gynecology;                Laterality: Bilateral; No date: TUBAL LIGATION  BMI    Body Mass Index: 19.23 kg/m      Reproductive/Obstetrics negative OB ROS                             Anesthesia Physical Anesthesia Plan  ASA: 2  Anesthesia Plan: General ETT   Post-op Pain Management:    Induction: Intravenous  PONV Risk Score and Plan: Ondansetron and Dexamethasone  Airway Management Planned: Oral ETT  Additional Equipment:   Intra-op Plan:   Post-operative Plan: Extubation in OR  Informed Consent: I have reviewed the patients History and Physical, chart, labs and discussed the procedure including the risks, benefits and alternatives for the proposed anesthesia with the patient or authorized representative who has indicated his/her understanding and acceptance.     Dental Advisory Given  Plan Discussed with: Anesthesiologist, CRNA and Surgeon  Anesthesia Plan Comments: (Patient consented for risks of anesthesia including but not limited to:  - adverse reactions to medications - damage to eyes, teeth, lips or other oral mucosa - nerve damage due to positioning  - sore throat or hoarseness - Damage to heart, brain, nerves, lungs, other parts of body or loss of life  Patient voiced understanding.)       Anesthesia Quick Evaluation

## 2022-07-18 NOTE — Anesthesia Postprocedure Evaluation (Signed)
Anesthesia Post Note  Patient: Patricia Miller  Procedure(s) Performed: MASTECTOMY WITH SENTINEL LYMPH NODE BIOPSY-left axillary RF tag (Bilateral: Breast) AXILLARY SENTINEL NODE BIOPSY (Left: Breast)  Patient location during evaluation: PACU Anesthesia Type: General Level of consciousness: awake and alert Pain management: pain level controlled Vital Signs Assessment: post-procedure vital signs reviewed and stable Respiratory status: spontaneous breathing, nonlabored ventilation, respiratory function stable and patient connected to nasal cannula oxygen Cardiovascular status: blood pressure returned to baseline and stable Postop Assessment: no apparent nausea or vomiting Anesthetic complications: no  No notable events documented.   Last Vitals:  Vitals:   07/18/22 0946 07/18/22 1351  BP: 118/72 (!) 150/94  Pulse: 84 (!) 107  Resp: 16 (!) 7  Temp: (!) 36.1 C 36.6 C  SpO2: 98% 100%    Last Pain:  Vitals:   07/18/22 1400  TempSrc:   PainSc: 0-No pain                 Dimas Millin

## 2022-07-18 NOTE — Transfer of Care (Signed)
Immediate Anesthesia Transfer of Care Note  Patient: Patricia Miller  Procedure(s) Performed: MASTECTOMY WITH SENTINEL LYMPH NODE BIOPSY-left axillary RF tag (Bilateral: Breast) AXILLARY SENTINEL NODE BIOPSY (Left: Breast)  Patient Location: PACU  Anesthesia Type:General  Level of Consciousness: awake, alert , and oriented  Airway & Oxygen Therapy: Patient Spontanous Breathing and Patient connected to face mask oxygen  Post-op Assessment: Report given to RN and Post -op Vital signs reviewed and stable  Post vital signs: Reviewed and stable  Last Vitals:  Vitals Value Taken Time  BP 150/94 07/18/22 1351  Temp    Pulse 106 07/18/22 1355  Resp 27 07/18/22 1355  SpO2 100 % 07/18/22 1355  Vitals shown include unvalidated device data.  Last Pain:  Vitals:   07/18/22 0946  TempSrc: Temporal  PainSc: 0-No pain         Complications: No notable events documented.

## 2022-07-18 NOTE — Anesthesia Procedure Notes (Addendum)
Procedure Name: Intubation Date/Time: 07/18/2022 11:06 AM  Performed by: Demetrius Charity, CRNAPre-anesthesia Checklist: Patient identified, Patient being monitored, Timeout performed, Emergency Drugs available and Suction available Patient Re-evaluated:Patient Re-evaluated prior to induction Oxygen Delivery Method: Circle system utilized Preoxygenation: Pre-oxygenation with 100% oxygen Induction Type: IV induction Ventilation: Mask ventilation without difficulty Laryngoscope Size: 3 and McGraph Grade View: Grade I Tube type: Oral Tube size: 6.5 mm Number of attempts: 1 Airway Equipment and Method: Stylet and Video-laryngoscopy Placement Confirmation: ETT inserted through vocal cords under direct vision, positive ETCO2 and breath sounds checked- equal and bilateral Secured at: 20 cm Tube secured with: Tape Dental Injury: Teeth and Oropharynx as per pre-operative assessment

## 2022-07-18 NOTE — Progress Notes (Signed)
Patient awake, alert to name and immediate surroundings. Baseline dementia per history. Able to communicate needs:  water, need to use bathroom.  Bil JP drains intact, patent. No s/s hematoma/bleeding noted. Continue to watch/monitor closely.

## 2022-07-18 NOTE — Plan of Care (Signed)
Continue to reinforce with patient due to dementia

## 2022-07-18 NOTE — Op Note (Addendum)
Preoperative diagnosis: Invasive mammary Carcinoma of the left breast.                                          Right breast atypical ductal hyperplasia  Postoperative diagnosis: Same.   Procedure: Left modified radical mastectomy.                     Right total mastectomy with axillary sentinel lymph node biopsy  Anesthesia: GETA  Surgeon: Dr. Windell Moment  Wound Classification: Clean  Indications: Patient is a 77 y.o. female who had an abnormal mammogram that on workup with core needle biopsy was found to be invasive mammary carcinoma, triple negative. Completed neoadjuvant chemotherapy. Also with right breast atypical ductal hyperplasia. After discussion of alternatives, the patient elected to proceed with bilateral mastectomies.  Findings: Three left axillary sentinel lymph node biopsies positive for metastatic disease No gross involvement of chest wall  Description of procedure: The patient was brought to the operating room and general anesthesia was induced. A time-out was completed verifying correct patient, procedure, site, positioning, and implant(s) and/or special equipment prior to beginning this procedure.  Methylene blue was injected subdermally for intraoperative identification of sentinel lymph node on the left axillary area.  The breast, chest wall, axilla, and upper arm and neck were prepped and draped in the usual sterile fashion.  We started the case on the left breast.  A skin incision was made that encompassed the nipple-areola complex and the previous biopsy scar and passed in an oblique direction across the breast. Flaps were raised in the avascular plane between subcutaneous tissue and breast tissue from the clavicle superiorly, the sternum medially, the anterior rectus sheath inferiorly, and past the lateral border of the pectoralis major muscle laterally. Hemostasis was achieved in the flaps. Next, the breast tissue and underlying pectoralis fascia were excised from  the pectoralis major muscle, progressing from medially to laterally. At the lateral border of the pectoralis major muscle, the breast tissue was swung laterally and a lateral pedicle identified where breast tissue gave way to fat of axilla. The lateral pedicle was incised and the specimen removed.   A hand-held gamma probe was used to identify the location of the hottest spot in the axilla.  The localizer was also used to identify the previously biopsied node.  Once it was identified it correlated with a sentinel lymph node.  Dissection was carried down until subdermal facias was advanced. The probe was placed and again, the point of maximal count was found. Dissection continue until nodule was identified. The probe was placed in contact with the node. The node was excised in its entirety.  2 additional lymph nodes were removed using the same technique.  Frozen section of this to lymph nodes were positive for metastatic disease.  It was decided to proceed with completion of modified radical mastectomy.  The clavipectoral fascia was then incised along the edge of the pectoralis major and the pectoralis major and minor were freed from surrounding fat and nodal tissue. Dissection progressed first under the pectoralis major and then under the pectoralis minor muscle. The pectoral muscles were retracted medially with a Richardson retractor. The medial pectoral neurovascular bundle was identified and preserved. The level II nodal tissue deep in the pectoralis minor was included in the dissection. The axillary vein was then identified and cleared of overlying fat. The first  branch off the axillary vein was clamped, divided, and tied with 2-0 silk ties. The thoracodorsal nerve was then identified deep to the ligated vein and preserved. The long thoracic nerve was then identified along the edge of the latissimus dorsi on the chest wall and preserved. The remaining nodal tissue between these nerves was then carefully  removed, taking care to protect the nerves.  Then, I proceeded to do the right total mastectomy with axillary sentinel lymph node biopsy.  A skin incision was made that encompassed the nipple-areola complex and the previous biopsy scar and passed in an oblique direction across the breast. Flaps were raised in the avascular plane between subcutaneous tissue and breast tissue from the clavicle superiorly, the sternum medially, the anterior rectus sheath inferiorly, and past the lateral border of the pectoralis major muscle laterally. Hemostasis was achieved in the flaps. Next, the breast tissue and underlying pectoralis fascia were excised from the pectoralis major muscle, progressing from medially to laterally. At the lateral border of the pectoralis major muscle, the breast tissue was swung laterally and a lateral pedicle identified where breast tissue gave way to fat of axilla. The lateral pedicle was incised and the specimen removed.   A hand-held gamma probe was used to identify the location of the hottest spot in the axilla. Dissection was carried down until subdermal facias was advanced. The probe was placed and again, the point of maximal count was found. Dissection continue until nodule was identified. The probe was placed in contact with the node. The node was excised in its entirety.  An additional hot lymph node was identified and excised as well.  Both chest wounds where irrigated and hemostasis was achieved. Closed suction drains were brought into the operative field through a separate stab incision and sutured to the skin with a 3-0 nylon suture. The wounds were closed with interrupted 3-0 Vicryl to the subcutaneous layer, followed by a subcuticular layer of Monocryl 4-0. The wound was dressed.  The patient tolerated the procedure well and was taken to the postanesthesia care unit in stable condition.   Sentinel Node Biopsy Synoptic Operative Report  Operation performed with curative  intent:Yes  Tracer(s) used to identify sentinel nodes in the upfront surgery (non-neoadjuvant) setting (select all that apply):N/A  Tracer(s) used to identify sentinel nodes in the neoadjuvant setting (select all that apply):Dye and Radioactive Tracer  All nodes (colored or non-colored) present at the end of a dye-filled lymphatic channel were removed:Yes   All significantly radioactive nodes were removed:Yes  All palpable suspicious nodes were removed:N/A  Biopsy-proven positive nodes marked with clips prior to chemotherapy were identified and removed:Yes  Specimen: Left breast                    Left axillary lymph nodes #1 (previously biopsied lymph node), #2, #3                   Left axillary lymph node dissection                     Right breast                            Right axillary Sentinel lymph nodes #1, #2.   Complications: None  Estimated Blood Loss: 50 mL

## 2022-07-18 NOTE — Progress Notes (Signed)
Patient with no open area's noted on skin except surgery sites bil breast area. Upper bil arms noted old "scab" area, not open. Per niece:  Patricia Miller, she will be staying the night, "my aunt can get quite agitated at times" Continue to monitor

## 2022-07-19 ENCOUNTER — Encounter: Payer: Self-pay | Admitting: General Surgery

## 2022-07-19 DIAGNOSIS — N6091 Unspecified benign mammary dysplasia of right breast: Secondary | ICD-10-CM | POA: Diagnosis not present

## 2022-07-19 MED ORDER — TRAMADOL HCL 50 MG PO TABS
ORAL_TABLET | ORAL | Status: AC
  Filled 2022-07-19: qty 1

## 2022-07-19 MED ORDER — ENOXAPARIN SODIUM 40 MG/0.4ML IJ SOSY
PREFILLED_SYRINGE | INTRAMUSCULAR | Status: AC
  Filled 2022-07-19: qty 0.4

## 2022-07-19 MED ORDER — HYDROCODONE-ACETAMINOPHEN 5-325 MG PO TABS: 1.0000 | ORAL_TABLET | ORAL | 0 refills | Status: AC | PRN

## 2022-07-19 NOTE — Discharge Instructions (Addendum)
  Diet: Resume home heart healthy regular diet.   Activity: No heavy lifting >20 pounds (children, pets, laundry, garbage) or strenuous activity until follow-up, but light activity and walking are encouraged. Do not drive or drink alcohol if taking narcotic pain medications.  Wound care: May shower with soapy water and pat dry (do not rub incisions), but no baths or submerging incision underwater until follow-up. (no swimming)   Empty and chart drain output 1-2 times per day.   Medications: Resume all home medications. For mild to moderate pain: acetaminophen (Tylenol) or ibuprofen (if no kidney disease). Combining Tylenol with alcohol can substantially increase your risk of causing liver disease. Narcotic pain medications, if prescribed, can be used for severe pain, though may cause nausea, constipation, and drowsiness. Do not combine Tylenol and Norco within a 6 hour period as Norco contains Tylenol. If you do not need the narcotic pain medication, you do not need to fill the prescription.  Call office [phone]) at any time if any questions, worsening pain, fevers/chills, bleeding, drainage from incision site, or other concerns.

## 2022-07-19 NOTE — Discharge Summary (Signed)
  Patient ID: Patricia Miller MRN: [id number] DOB/AGE: [birth year] 77 y.o.  Admit date: 07/18/2022 Discharge date: 07/19/2022   Discharge Diagnoses:  Principal Problem:   Breast cancer [facility])   Procedures: Left modified radical mastectomy                       Right total mastectomy with sentinel lymph node biopsy  Hospital Course: Patient with left breast triple negative invasive mammary carcinoma with preoperative positive lymph node.  She completed neoadjuvant chemotherapy.  She was admitted for surgical intervention for treatment of breast cancer.  She had a left modified radical mastectomy and right total mastectomy with sentinel node biopsy due to atypical ductal hyperplasia.  This morning patient doing well.  Pain controlled.  She is tolerating diet.  The wounds are healing well.  There is no hematoma or ischemic tissue.  Physical Exam Vitals reviewed.  Constitutional:      Appearance: Normal appearance.  HENT:     Head: Normocephalic.  Cardiovascular:     Rate and Rhythm: Normal rate and regular rhythm.  Pulmonary:     Effort: Pulmonary effort is normal.  Chest:  Breasts:    Right: Absent.     Left: Absent.     Comments: Drains with serosanguinous output. No ischemia or fluid collection.  Abdominal:     General: Abdomen is flat.  Musculoskeletal:     Cervical back: Normal range of motion.  Skin:    Capillary Refill: Capillary refill takes less than 2 seconds.  Neurological:     Mental Status: She is alert and oriented to person, place, and time.      Consults: None  Disposition: Discharge disposition: 01-Home or Self Care       Discharge Instructions     Diet - low sodium heart healthy   Complete by: As directed    Increase activity slowly   Complete by: As directed       Allergies as of 07/19/2022   No Known Allergies      Medication List     TAKE these medications    buPROPion 150 MG 24 hr tablet Commonly known as: WELLBUTRIN XL Take 150  mg by mouth every morning.   cetirizine 10 MG tablet Commonly known as: ZYRTEC Take 10 mg by mouth daily as needed for allergies.   DULoxetine 20 MG capsule Commonly known as: CYMBALTA Take 1 capsule (20 mg total) by mouth daily.   HYDROcodone-acetaminophen 5-325 MG tablet Commonly known as: Norco Take 1 tablet by mouth every 4 (four) hours as needed for up to 3 days for moderate pain.   lidocaine-prilocaine cream Commonly known as: EMLA Apply 1 Application topically as needed.        Follow-up Information     Herbert Pun, MD Follow up in 1 week(s).   Specialty: General Surgery Why: After mastectomy and evaluation of drains Contact information: [place] [address] [phone]

## 2022-07-20 ENCOUNTER — Ambulatory Visit: Payer: PPO | Admitting: Oncology

## 2022-07-20 ENCOUNTER — Other Ambulatory Visit: Payer: PPO

## 2022-07-23 ENCOUNTER — Other Ambulatory Visit: Payer: Self-pay | Admitting: Anatomic Pathology & Clinical Pathology

## 2022-07-23 LAB — SURGICAL PATHOLOGY

## 2022-07-25 ENCOUNTER — Other Ambulatory Visit: Payer: Self-pay

## 2022-07-26 ENCOUNTER — Encounter: Payer: Self-pay | Admitting: Oncology

## 2022-07-27 ENCOUNTER — Inpatient Hospital Stay: Payer: PPO | Attending: Oncology

## 2022-07-27 ENCOUNTER — Encounter: Payer: Self-pay | Admitting: Oncology

## 2022-07-27 ENCOUNTER — Inpatient Hospital Stay (HOSPITAL_BASED_OUTPATIENT_CLINIC_OR_DEPARTMENT_OTHER): Payer: PPO | Admitting: Oncology

## 2022-07-27 VITALS — BP 117/67 | HR 102 | Temp 96.6°F | Resp 18 | Ht 62.0 in | Wt 93.0 lb

## 2022-07-27 DIAGNOSIS — R451 Restlessness and agitation: Secondary | ICD-10-CM | POA: Insufficient documentation

## 2022-07-27 DIAGNOSIS — R413 Other amnesia: Secondary | ICD-10-CM | POA: Insufficient documentation

## 2022-07-27 DIAGNOSIS — Z79899 Other long term (current) drug therapy: Secondary | ICD-10-CM | POA: Insufficient documentation

## 2022-07-27 DIAGNOSIS — Z171 Estrogen receptor negative status [ER-]: Secondary | ICD-10-CM | POA: Insufficient documentation

## 2022-07-27 DIAGNOSIS — Z7189 Other specified counseling: Secondary | ICD-10-CM | POA: Diagnosis not present

## 2022-07-27 DIAGNOSIS — C50412 Malignant neoplasm of upper-outer quadrant of left female breast: Secondary | ICD-10-CM | POA: Insufficient documentation

## 2022-07-27 DIAGNOSIS — D6481 Anemia due to antineoplastic chemotherapy: Secondary | ICD-10-CM

## 2022-07-27 LAB — COMPREHENSIVE METABOLIC PANEL
ALT: 17 U/L (ref 0–44)
AST: 25 U/L (ref 15–41)
Albumin: 3.2 g/dL — ABNORMAL LOW (ref 3.5–5.0)
Alkaline Phosphatase: 66 U/L (ref 38–126)
Anion gap: 6 (ref 5–15)
BUN: 9 mg/dL (ref 8–23)
CO2: 23 mmol/L (ref 22–32)
Calcium: 8.5 mg/dL — ABNORMAL LOW (ref 8.9–10.3)
Chloride: 107 mmol/L (ref 98–111)
Creatinine, Ser: 0.65 mg/dL (ref 0.44–1.00)
GFR, Estimated: >60 mL/min (ref >60)
Glucose, Bld: 103 mg/dL — ABNORMAL HIGH (ref 70–99)
Potassium: 3.9 mmol/L (ref 3.5–5.1)
Sodium: 136 mmol/L (ref 135–145)
Total Bilirubin: 0.7 mg/dL (ref 0.3–1.2)
Total Protein: 6 g/dL — ABNORMAL LOW (ref 6.5–8.1)

## 2022-07-27 LAB — CBC WITH DIFFERENTIAL/PLATELET
Abs Immature Granulocytes: 0.02 10*3/uL (ref 0.00–0.07)
Basophils Absolute: 0 10*3/uL (ref 0.0–0.1)
Basophils Relative: 1 %
Eosinophils Absolute: 0.1 10*3/uL (ref 0.0–0.5)
Eosinophils Relative: 2 %
HCT: 35 % — ABNORMAL LOW (ref 36.0–46.0)
Hemoglobin: 11 g/dL — ABNORMAL LOW (ref 12.0–15.0)
Immature Granulocytes: 0 %
Lymphocytes Relative: 16 %
Lymphs Abs: 1 10*3/uL (ref 0.7–4.0)
MCH: 32.9 pg (ref 26.0–34.0)
MCHC: 31.4 g/dL (ref 30.0–36.0)
MCV: 104.8 fL — ABNORMAL HIGH (ref 80.0–100.0)
Monocytes Absolute: 0.6 10*3/uL (ref 0.1–1.0)
Monocytes Relative: 10 %
Neutro Abs: 4.6 10*3/uL (ref 1.7–7.7)
Neutrophils Relative %: 71 %
Platelets: 189 10*3/uL (ref 150–400)
RBC: 3.34 MIL/uL — ABNORMAL LOW (ref 3.87–5.11)
RDW: 15 % (ref 11.5–15.5)
WBC: 6.4 10*3/uL (ref 4.0–10.5)
nRBC: 0 % (ref 0.0–0.2)

## 2022-07-27 MED ORDER — SODIUM CHLORIDE 0.9% FLUSH
10.0000 mL | Freq: Once | INTRAVENOUS | Status: AC | PRN
  Administered 2022-07-27: 10 mL
  Filled 2022-07-27: qty 10

## 2022-07-27 MED ORDER — HEPARIN SOD (PORK) LOCK FLUSH 100 UNIT/ML IV SOLN
500.0000 [IU] | Freq: Once | INTRAVENOUS | Status: AC | PRN
  Administered 2022-07-27: 500 [IU]
  Filled 2022-07-27: qty 5

## 2022-07-29 ENCOUNTER — Encounter: Payer: Self-pay | Admitting: Oncology

## 2022-07-29 NOTE — Progress Notes (Signed)
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  Showed a irregular mass measuring 2.3 x 3.2 x 3.6 cm.  There were 2 other smaller masses in the left breast measuring 4 x 7 x 9 mm and 3 x 5 x 5 mm.  7 abnormal lymph nodes in the left axilla.  Indeterminate solid and cystic mass in the right breast in the retroareolar region measuring 1 x 1.2 x 1.9 cm as well as another 4.5 cm group of indeterminate microcalcifications in the lower right breast.  Patient had 2 left breast biopsy along with left lymph node biopsy.  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   PET CT scan showed hypermetabolic left breast mass and clusters of small but hypermetabolic left axillary and subpectoral lymph nodes.  1.2 x 0.9 cm subpleural nodule with an SUV of 1.6.  Final pathology showed 12 mm grade 3 elicitable invasive mammary carcinoma with negative margins.  6 lymph nodes with macrometastases with extranodal  extension.   Interval history-patient is recovering well from her mastectomy.  She was not able to verbalize treatments she has gone through for her breast cancer.  Her niece Margaretha Sheffield is concerned that overall her memory is getting worse.  She has episodes of agitation.  She is concerned about her ability to undergo any further treatments.  ECOG PS- 2 Pain scale- 0   Review of systems- Review of Systems  Constitutional:  Positive for malaise/fatigue. Negative for chills, fever and weight loss.  HENT:  Negative for congestion, ear discharge and nosebleeds.   Eyes:  Negative for blurred vision.  Respiratory:  Negative for cough, hemoptysis, sputum production, shortness of breath and wheezing.   Cardiovascular:  Negative for chest pain, palpitations, orthopnea and claudication.  Gastrointestinal:  Negative for abdominal pain, blood in stool, constipation, diarrhea, heartburn, melena, nausea and vomiting.  Genitourinary:  Negative for dysuria, flank pain, frequency, hematuria and urgency.  Musculoskeletal:  Negative for back pain, joint pain and myalgias.  Skin:  Negative for rash.  Neurological:  Negative for dizziness, tingling, focal weakness, seizures, weakness and headaches.  Endo/Heme/Allergies:  Does not bruise/bleed easily.  Psychiatric/Behavioral:  Positive for memory loss. Negative for depression and suicidal ideas. The patient does not have insomnia.       No Known Allergies   Past Medical History:  Diagnosis Date   Anemia    Anxiety    Arthritis    NECK   AVM (arteriovenous malformation) brain    Breast cancer ([place])    Cancer ([place])  BASAL CELL AND MELANOMA   DAVF (dural arteriovenous fistula) 2015   Dementia ([facility])    DVT (deep venous thrombosis) ([facility])    Family history of adverse reaction to anesthesia    niece has to come out of anesthesia slow   HLD (hyperlipidemia)    Hypertension    PE (pulmonary thromboembolism) ([place])    Pneumonia      Past Surgical  History:  Procedure Laterality Date   ABDOMINAL HYSTERECTOMY     AXILLARY SENTINEL NODE BIOPSY Left 07/18/2022   Procedure: AXILLARY SENTINEL NODE BIOPSY;  Surgeon: Herbert Pun, MD;  Location: [facility] ORS;  Service: General;  Laterality: Left;   BRAIN SURGERY  2015   DURAL AV FISTULA ([facility])   BREAST BIOPSY Left 12/14/2021   [place] Bx 3:00 7 CMFN, Coil Clip, Path Pending   BREAST BIOPSY Left 12/14/2021   [place] Bx 10:00 2 CMFN, Venus Clip, path pending   breast biopsy Left 12/14/2021   [place] Axille, Hydromarker (butterfly). path pending   BREAST BIOPSY Right 12/19/2021   Stereo bx-calcs, "COIL" clip-path pending   BREAST BIOPSY Right 12/19/2021   u/s bx-mass, 3:00, retroareolar, "HEART" clip-path pending   BREAST BIOPSY Left 06/25/2022   [place] LT RADIO FREQUENCY TAG LOC US GUIDE 06/25/2022 [facility]   COLONOSCOPY     COLONOSCOPY WITH PROPOFOL N/A 02/20/2017   Procedure: COLONOSCOPY WITH PROPOFOL;  Surgeon: Manya Silvas, MD;  Location: [facility] ENDOSCOPY;  Service: Endoscopy;  Laterality: N/A;   HYSTEROSCOPY WITH D & C N/A 07/29/2015   Procedure: DILATATION AND CURETTAGE /HYSTEROSCOPY;  Surgeon: Benjaman Kindler, MD;  Location: [facility] ORS;  Service: Gynecology;  Laterality: N/A;   HYSTEROSCOPY WITH D & C N/A 01/24/2018   Procedure: DILATATION AND CURETTAGE /HYSTEROSCOPY;  Surgeon: Benjaman Kindler, MD;  Location: [facility];  Service: Gynecology;  Laterality: N/A;   LAPAROSCOPY N/A 01/24/2018   Procedure: LAPAROSCOPY DIAGNOSTIC;  Surgeon: Benjaman Kindler, MD;  Location: [facility];  Service: Gynecology;  Laterality: N/A;   MASTECTOMY W/ SENTINEL NODE BIOPSY Bilateral 07/18/2022   Procedure: MASTECTOMY WITH SENTINEL LYMPH NODE BIOPSY-left axillary RF tag;  Surgeon: Herbert Pun, MD;  Location: [facility];  Service: General;  Laterality: Bilateral;   PERIPHERAL VASCULAR THROMBECTOMY N/A 02/29/2020   Procedure: PERIPHERAL VASCULAR THROMBECTOMY / THROMBOLYSIS WITH POSSIBLE PULMONARY  THROMBECTOMY / THROMBOLYSIS;  Surgeon: Algernon Huxley, MD;  Location: [facility];  Service: Cardiovascular;  Laterality: N/A;   PORTACATH PLACEMENT N/A 01/08/2022   Procedure: INSERTION PORT-A-CATH;  Surgeon: Herbert Pun, MD;  Location: [facility];  Service: General;  Laterality: N/A;   TONSILLECTOMY     AGE 1   TOTAL LAPAROSCOPIC HYSTERECTOMY WITH BILATERAL SALPINGO OOPHORECTOMY Bilateral 12/28/2019   Procedure: TOTAL LAPAROSCOPIC HYSTERECTOMY WITH BILATERAL SALPINGO OOPHORECTOMY;  Surgeon: Benjaman Kindler, MD;  Location: [facility];  Service: Gynecology;  Laterality: Bilateral;   TUBAL LIGATION      Social History   Socioeconomic History   Marital status: Divorced    Spouse name: Not on file   Number of children: Not on file   Years of education: Not on file   Highest education level: Not on file  Occupational History   Not on file  Tobacco Use   Smoking status: Former    Packs/day: 0.50    Years: 15.00    Total pack years: 7.50    Types: Cigarettes    Quit date: 07/21/2000    Years since quitting: 22.0   Smokeless tobacco: Never  Vaping Use  Vaping Use: Never used  Substance and Sexual Activity   Alcohol use: Not Currently   Drug use: No   Sexual activity: Not Currently  Other Topics Concern   Not on file  Social History Narrative   Lives alone, caregiver 23 year old daughter   Social Determinants of Health   Financial Resource Strain: Not on file  Food Insecurity: No Food Insecurity (07/18/2022)   Hunger Vital Sign    Worried About Running Out of Food in the Last Year: Never true    Ran Out of Food in the Last Year: Never true  Transportation Needs: No Transportation Needs (07/18/2022)   PRAPARE - Hydrologist (Medical): No    Lack of Transportation (Non-Medical): No  Physical Activity: Not on file  Stress: Not on file  Social Connections: Not on file  Intimate Partner Violence: Unknown (07/18/2022)   Humiliation,  Afraid, Rape, and Kick questionnaire    Fear of Current or Ex-Partner: Patient refused    Emotionally Abused: Patient refused    Physically Abused: Patient refused    Sexually Abused: Patient refused    Family History  Problem Relation Age of Onset   Stomach cancer Sister    Hypertension Sister    Hypertension Sister    Diabetes Niece    Hypertension Niece    Breast cancer Neg Hx      Current Outpatient Medications:    buPROPion (WELLBUTRIN XL) 150 MG 24 hr tablet, Take 150 mg by mouth every morning., Disp: , Rfl: 6   cetirizine (ZYRTEC) 10 MG tablet, Take 10 mg by mouth daily as needed for allergies., Disp: , Rfl:    DULoxetine (CYMBALTA) 20 MG capsule, Take 1 capsule (20 mg total) by mouth daily., Disp: 30 capsule, Rfl: 3   lidocaine-prilocaine (EMLA) cream, Apply 1 Application topically as needed., Disp: , Rfl:   Physical exam:  Vitals:   07/27/22 1353  BP: 117/67  Pulse: (!) 102  Resp: 18  Temp: (!) 96.6 F (35.9 C)  TempSrc: Tympanic  SpO2: 100%  Weight: 93 lb (42.2 kg)  Height: 5' 2"$  (1.575 m)   Physical Exam Cardiovascular:     Rate and Rhythm: Normal rate and regular rhythm.     Heart sounds: Normal heart sounds.  Pulmonary:     Effort: Pulmonary effort is normal.     Breath sounds: Normal breath sounds.  Skin:    General: Skin is warm and dry.  Neurological:     Mental Status: She is alert and oriented to person, place, and time.         Latest Ref Rng & Units 07/27/2022    1:43 PM  CMP  Glucose 70 - 99 mg/dL 103   BUN 8 - 23 mg/dL 9   Creatinine 0.44 - 1.00 mg/dL 0.65   Sodium 135 - 145 mmol/L 136   Potassium 3.5 - 5.1 mmol/L 3.9   Chloride 98 - 111 mmol/L 107   CO2 22 - 32 mmol/L 23   Calcium 8.9 - 10.3 mg/dL 8.5   Total Protein 6.5 - 8.1 g/dL 6.0   Total Bilirubin 0.3 - 1.2 mg/dL 0.7   Alkaline Phos 38 - 126 U/L 66   AST 15 - 41 U/L 25   ALT 0 - 44 U/L 17       Latest Ref Rng & Units 07/27/2022    1:43 PM  CBC  WBC 4.0 - 10.5 K/uL 6.4    Hemoglobin 12.0 -  15.0 g/dL 11.0   Hematocrit 36.0 - 46.0 % 35.0   Platelets 150 - 400 K/uL 189     No images are attached to the encounter.  NM Sentinel Node Inj-No Rpt (Breast)  Result Date: 07/18/2022 Sulfur Colloid was injected by the Nuclear Medicine Technologist for sentinel lymph node localization.   NM Sentinel Node Inj-No Rpt (Breast)  Result Date: 07/18/2022 Sulfur Colloid was injected by the Nuclear Medicine Technologist for sentinel lymph node localization.   NM PET Image Restag (PS) Skull Base To Thigh  Result Date: 07/06/2022 CLINICAL DATA:  Subsequent treatment strategy for breast cancer. EXAM: NUCLEAR MEDICINE PET SKULL BASE TO THIGH TECHNIQUE: 5.72 mCi F-18 FDG was injected intravenously. Full-ring PET imaging was performed from the skull base to thigh after the radiotracer. CT data was obtained and used for attenuation correction and anatomic localization. Fasting blood glucose: 61 mg/dl COMPARISON:  Multiple priors including PET-CT December 27, 2021 FINDINGS: Mediastinal blood pool activity: SUV max 1.31 Liver activity: SUV max NA NECK: No hypermetabolic cervical adenopathy. Incidental CT findings: None. CHEST: Decreased size and FDG avidity in the lateral left breast mass now measuring 17 mm on image 103/2 with a max SUV of 1.2 previously measuring 3 cm with a max SUV of 9.3. Decreased size of the clustered small left axillary lymph nodes now measuring no greater than 4 mm in short axis on image 77/2 with a max SUV of 0.8 previously measuring up to 7 mm with a max SUV of 1.8. Subpleural right lower lobe pulmonary nodule now measures 8 x 6 mm on image 96/2 max SUV of 1.4 previously measuring 1.2 x 0.9 cm with a max SUV of 1.6. New hypermetabolic masslike consolidation in the posterior left upper lobe measures 4.0 x 1.9 cm on image 104/2 with a max SUV of 3.2. Mildly metabolic nodular consolidation in the right lower lobe measures 15 x 12 mm on image 113/2 max SUV of 1.9 previously  reflecting a curvilinear band with adjacent pleural nodularity measuring 11 x 7 mm with a max SUV 1.3. Low-level FDG avidity in a linear consolidative band in the posterior left upper lobe measuring 16 x 7 mm on image 94/2 with a max SUV of 1.7 Similar low-level FDG avidity in a prominent pretracheal lymph node measuring 7 mm on image 72/2 with a max SUV of 2.4 previously measuring 8 mm with a max SUV of 1.5. Incidental CT findings: Aortic and branch vessel atherosclerosis. Right chest Port-A-Cath with tip in the right atrium. Cardiac enlargement. No significant pericardial effusion/thickening. ABDOMEN/PELVIS: No abnormal hypermetabolic activity within the liver, pancreas, adrenal glands, or spleen. No hypermetabolic lymph nodes in the abdomen or pelvis. Incidental CT findings: Aortic atherosclerosis. IVC filter. Sigmoid colonic diverticulosis without findings of acute diverticulitis. SKELETON: No focal hypermetabolic activity to suggest skeletal metastasis. Incidental CT findings: None. IMPRESSION: 1. Decreased size and FDG avidity in the lateral left breast mass and clustered left axillary lymph nodes, compatible with treatment response. 2. Subpleural right lower lobe pulmonary nodule is slightly decreased in size and FDG avidity. Continued attention on follow-up imaging suggested. 3. New hypermetabolic masslike consolidation in the posterior left upper lobe is nonspecific but favored reflect an infectious or inflammatory etiology. Suggest attention on short-term interval follow-up dedicated chest CT. 4. Increased size/conspicuity of a minimally metabolic consolidative nodularity in the subpleural right lower lobe favored to reflect atelectasis or an infectious/inflammatory etiology. Suggest attention on short-term interval follow-up CT 5. Low-level FDG avidity in a linear consolidative  band in the posterior left upper lobe is nonspecific but favored to reflect scarring or infection. Attention on follow-up imaging  suggested. 6. Similar low-level FDG avidity in a prominent pretracheal lymph node which is nonspecific but favored reactive. Suggest attention on follow-up imaging. 7. No evidence of hypermetabolic metastatic disease in the abdomen or pelvis. 8.  Aortic Atherosclerosis (ICD10-I70.0). Electronically Signed   By: Dahlia Bailiff M.D.   On: 07/06/2022 10:38     Assessment and plan- Patient is a 77 y.o. 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   She is here to discuss final pathology results and further management  Despite neoadjuvant chemotherapy with keynote 522 regimen patient had significant residual disease.  She had 12 mm residual tumor with 6 positive lymph nodes.  This generally portends a poor prognosis and overall high risk of recurrence.  Ideally in the scenario patient warrants postmastectomy radiation followed by 9 cycles of adjuvant Keytruda and consideration for adjuvant Xeloda which is an oral chemotherapy pill given 2 weeks on 1 week off for 6 months.  Patient's niece is concerned that overall patient cognitive abilities have gone down.  She does not remember most of the things told to her and there are.'s of agitation.  Today patient was not able to verbalize her future options which I mentioned about.  There is a concern that her quality of life could be potentially worsen given her ongoing cognitive issues along with proposed treatments.  I will discuss with Dr. Sabra Heck as to what he thinks about her general cognitive status as well.  Patient has limited decision-making capacity as she understands to some extent as to what is going on with her but is not able to recollect reflect and say it back to me.  Patient's niece Margaretha Sheffield is her healthcare proxy and she is leaning towards not pursuing any adjuvant treatment at this time but only giving consideration for surveillance.  If she does have  metastatic disease in the future as well she would not opt for any palliative treatment.  I will reach out to patient and her niece after I speak to Dr. Sabra Heck.  As of now I am holding off on referral to radiation oncology    Visit Diagnosis 1. Goals of care, counseling/discussion   2. Malignant neoplasm of upper-outer quadrant of left breast in female, estrogen receptor negative ([place])      Dr. Randa Evens, MD, MPH [facility] [phone] 07/29/2022 3:11 PM

## 2022-08-02 ENCOUNTER — Other Ambulatory Visit: Payer: Self-pay | Admitting: *Deleted

## 2022-08-02 ENCOUNTER — Telehealth: Payer: Self-pay | Admitting: Oncology

## 2022-08-02 DIAGNOSIS — Z171 Estrogen receptor negative status [ER-]: Secondary | ICD-10-CM

## 2022-08-02 NOTE — Telephone Encounter (Signed)
Called to let niece know about appt for labs and to see MD in 3 months. No answer, left vm and told her to call back if this time did not work and she needed to reschedule

## 2022-08-02 NOTE — Telephone Encounter (Signed)
Sent order to remove portacath in next 2 weeks and the faxed order did transmit

## 2022-08-03 ENCOUNTER — Other Ambulatory Visit: Payer: Self-pay

## 2022-08-09 ENCOUNTER — Encounter: Payer: Self-pay | Admitting: *Deleted

## 2022-09-11 ENCOUNTER — Other Ambulatory Visit: Payer: Self-pay

## 2022-09-20 DIAGNOSIS — Z95828 Presence of other vascular implants and grafts: Secondary | ICD-10-CM | POA: Diagnosis not present

## 2022-09-28 DIAGNOSIS — Z0000 Encounter for general adult medical examination without abnormal findings: Secondary | ICD-10-CM | POA: Diagnosis not present

## 2022-09-28 DIAGNOSIS — E440 Moderate protein-calorie malnutrition: Secondary | ICD-10-CM | POA: Diagnosis not present

## 2022-09-28 DIAGNOSIS — C50912 Malignant neoplasm of unspecified site of left female breast: Secondary | ICD-10-CM | POA: Diagnosis not present

## 2022-09-28 DIAGNOSIS — E782 Mixed hyperlipidemia: Secondary | ICD-10-CM | POA: Diagnosis not present

## 2022-09-28 DIAGNOSIS — F0280 Dementia in other diseases classified elsewhere without behavioral disturbance: Secondary | ICD-10-CM | POA: Diagnosis not present

## 2022-09-28 DIAGNOSIS — Z111 Encounter for screening for respiratory tuberculosis: Secondary | ICD-10-CM | POA: Diagnosis not present

## 2022-09-28 DIAGNOSIS — C773 Secondary and unspecified malignant neoplasm of axilla and upper limb lymph nodes: Secondary | ICD-10-CM | POA: Diagnosis not present

## 2022-09-28 DIAGNOSIS — G309 Alzheimer's disease, unspecified: Secondary | ICD-10-CM | POA: Diagnosis not present

## 2022-10-04 DIAGNOSIS — F4321 Adjustment disorder with depressed mood: Secondary | ICD-10-CM | POA: Diagnosis not present

## 2022-10-04 DIAGNOSIS — F330 Major depressive disorder, recurrent, mild: Secondary | ICD-10-CM | POA: Diagnosis not present

## 2022-10-11 DIAGNOSIS — H539 Unspecified visual disturbance: Secondary | ICD-10-CM | POA: Diagnosis not present

## 2022-10-11 DIAGNOSIS — F02B4 Dementia in other diseases classified elsewhere, moderate, with anxiety: Secondary | ICD-10-CM | POA: Diagnosis not present

## 2022-10-11 DIAGNOSIS — R451 Restlessness and agitation: Secondary | ICD-10-CM | POA: Diagnosis not present

## 2022-10-11 DIAGNOSIS — F4323 Adjustment disorder with mixed anxiety and depressed mood: Secondary | ICD-10-CM | POA: Diagnosis not present

## 2022-10-11 DIAGNOSIS — G309 Alzheimer's disease, unspecified: Secondary | ICD-10-CM | POA: Diagnosis not present

## 2022-10-18 DIAGNOSIS — F02B4 Dementia in other diseases classified elsewhere, moderate, with anxiety: Secondary | ICD-10-CM | POA: Diagnosis not present

## 2022-10-18 DIAGNOSIS — G309 Alzheimer's disease, unspecified: Secondary | ICD-10-CM | POA: Diagnosis not present

## 2022-10-18 DIAGNOSIS — G4700 Insomnia, unspecified: Secondary | ICD-10-CM | POA: Diagnosis not present

## 2022-10-18 DIAGNOSIS — F4323 Adjustment disorder with mixed anxiety and depressed mood: Secondary | ICD-10-CM | POA: Diagnosis not present

## 2022-10-18 DIAGNOSIS — R451 Restlessness and agitation: Secondary | ICD-10-CM | POA: Diagnosis not present

## 2022-10-19 ENCOUNTER — Inpatient Hospital Stay: Payer: PPO

## 2022-10-19 DIAGNOSIS — F4323 Adjustment disorder with mixed anxiety and depressed mood: Secondary | ICD-10-CM | POA: Diagnosis not present

## 2022-10-19 DIAGNOSIS — F330 Major depressive disorder, recurrent, mild: Secondary | ICD-10-CM | POA: Diagnosis not present

## 2022-10-19 DIAGNOSIS — F02B4 Dementia in other diseases classified elsewhere, moderate, with anxiety: Secondary | ICD-10-CM | POA: Diagnosis not present

## 2022-10-25 DIAGNOSIS — R451 Restlessness and agitation: Secondary | ICD-10-CM | POA: Diagnosis not present

## 2022-10-25 DIAGNOSIS — F02B4 Dementia in other diseases classified elsewhere, moderate, with anxiety: Secondary | ICD-10-CM | POA: Diagnosis not present

## 2022-10-25 DIAGNOSIS — G309 Alzheimer's disease, unspecified: Secondary | ICD-10-CM | POA: Diagnosis not present

## 2022-10-29 DIAGNOSIS — G309 Alzheimer's disease, unspecified: Secondary | ICD-10-CM | POA: Diagnosis not present

## 2022-10-29 DIAGNOSIS — I10 Essential (primary) hypertension: Secondary | ICD-10-CM | POA: Diagnosis not present

## 2022-10-29 DIAGNOSIS — Z111 Encounter for screening for respiratory tuberculosis: Secondary | ICD-10-CM | POA: Diagnosis not present

## 2022-10-29 DIAGNOSIS — F0390 Unspecified dementia without behavioral disturbance: Secondary | ICD-10-CM | POA: Diagnosis not present

## 2022-10-29 DIAGNOSIS — Z79899 Other long term (current) drug therapy: Secondary | ICD-10-CM | POA: Diagnosis not present

## 2022-10-30 ENCOUNTER — Other Ambulatory Visit: Payer: Self-pay

## 2022-10-31 ENCOUNTER — Inpatient Hospital Stay: Payer: PPO | Admitting: Oncology

## 2022-10-31 ENCOUNTER — Inpatient Hospital Stay: Payer: PPO

## 2022-11-07 DIAGNOSIS — D649 Anemia, unspecified: Secondary | ICD-10-CM | POA: Diagnosis not present

## 2022-11-13 ENCOUNTER — Other Ambulatory Visit: Payer: Self-pay

## 2022-11-15 DIAGNOSIS — Z1331 Encounter for screening for depression: Secondary | ICD-10-CM | POA: Diagnosis not present

## 2022-11-15 DIAGNOSIS — N182 Chronic kidney disease, stage 2 (mild): Secondary | ICD-10-CM | POA: Diagnosis not present

## 2022-11-15 DIAGNOSIS — Z86711 Personal history of pulmonary embolism: Secondary | ICD-10-CM | POA: Diagnosis not present

## 2022-11-15 DIAGNOSIS — E785 Hyperlipidemia, unspecified: Secondary | ICD-10-CM | POA: Diagnosis not present

## 2022-11-15 DIAGNOSIS — I129 Hypertensive chronic kidney disease with stage 1 through stage 4 chronic kidney disease, or unspecified chronic kidney disease: Secondary | ICD-10-CM | POA: Diagnosis not present

## 2022-11-15 DIAGNOSIS — Z86718 Personal history of other venous thrombosis and embolism: Secondary | ICD-10-CM | POA: Diagnosis not present

## 2022-11-16 DIAGNOSIS — I10 Essential (primary) hypertension: Secondary | ICD-10-CM | POA: Diagnosis not present

## 2022-11-16 DIAGNOSIS — Q282 Arteriovenous malformation of cerebral vessels: Secondary | ICD-10-CM | POA: Diagnosis not present

## 2022-11-16 DIAGNOSIS — F0390 Unspecified dementia without behavioral disturbance: Secondary | ICD-10-CM | POA: Diagnosis not present

## 2022-11-16 DIAGNOSIS — Z86718 Personal history of other venous thrombosis and embolism: Secondary | ICD-10-CM | POA: Diagnosis not present

## 2022-11-16 DIAGNOSIS — E785 Hyperlipidemia, unspecified: Secondary | ICD-10-CM | POA: Diagnosis not present

## 2022-11-20 DIAGNOSIS — B351 Tinea unguium: Secondary | ICD-10-CM | POA: Diagnosis not present

## 2022-11-20 DIAGNOSIS — I7091 Generalized atherosclerosis: Secondary | ICD-10-CM | POA: Diagnosis not present

## 2022-11-21 ENCOUNTER — Inpatient Hospital Stay: Payer: PPO | Attending: Oncology

## 2022-11-21 ENCOUNTER — Encounter: Payer: Self-pay | Admitting: Oncology

## 2022-11-21 ENCOUNTER — Inpatient Hospital Stay (HOSPITAL_BASED_OUTPATIENT_CLINIC_OR_DEPARTMENT_OTHER): Payer: PPO | Admitting: Oncology

## 2022-11-21 VITALS — BP 131/79 | HR 77 | Temp 97.3°F | Resp 16 | Ht 62.0 in | Wt 96.7 lb

## 2022-11-21 DIAGNOSIS — R4189 Other symptoms and signs involving cognitive functions and awareness: Secondary | ICD-10-CM | POA: Diagnosis not present

## 2022-11-21 DIAGNOSIS — Z9221 Personal history of antineoplastic chemotherapy: Secondary | ICD-10-CM | POA: Diagnosis not present

## 2022-11-21 DIAGNOSIS — Z87891 Personal history of nicotine dependence: Secondary | ICD-10-CM | POA: Insufficient documentation

## 2022-11-21 DIAGNOSIS — C50912 Malignant neoplasm of unspecified site of left female breast: Secondary | ICD-10-CM | POA: Insufficient documentation

## 2022-11-21 DIAGNOSIS — Z171 Estrogen receptor negative status [ER-]: Secondary | ICD-10-CM | POA: Insufficient documentation

## 2022-11-21 DIAGNOSIS — Z79620 Long term (current) use of immunosuppressive biologic: Secondary | ICD-10-CM | POA: Diagnosis not present

## 2022-11-21 DIAGNOSIS — Z08 Encounter for follow-up examination after completed treatment for malignant neoplasm: Secondary | ICD-10-CM | POA: Diagnosis not present

## 2022-11-21 DIAGNOSIS — Z800 Family history of malignant neoplasm of digestive organs: Secondary | ICD-10-CM | POA: Diagnosis not present

## 2022-11-21 DIAGNOSIS — Z9013 Acquired absence of bilateral breasts and nipples: Secondary | ICD-10-CM | POA: Insufficient documentation

## 2022-11-21 DIAGNOSIS — Z79899 Other long term (current) drug therapy: Secondary | ICD-10-CM | POA: Insufficient documentation

## 2022-11-21 DIAGNOSIS — Z853 Personal history of malignant neoplasm of breast: Secondary | ICD-10-CM | POA: Diagnosis not present

## 2022-11-21 DIAGNOSIS — Z2989 Encounter for other specified prophylactic measures: Secondary | ICD-10-CM

## 2022-11-21 LAB — COMPREHENSIVE METABOLIC PANEL
ALT: 12 U/L (ref 0–44)
AST: 22 U/L (ref 15–41)
Albumin: 3.6 g/dL (ref 3.5–5.0)
Alkaline Phosphatase: 92 U/L (ref 38–126)
Anion gap: 9 (ref 5–15)
BUN: 15 mg/dL (ref 8–23)
CO2: 27 mmol/L (ref 22–32)
Calcium: 9.1 mg/dL (ref 8.9–10.3)
Chloride: 104 mmol/L (ref 98–111)
Creatinine, Ser: 0.76 mg/dL (ref 0.44–1.00)
GFR, Estimated: >60 mL/min (ref >60)
Glucose, Bld: 104 mg/dL — ABNORMAL HIGH (ref 70–99)
Potassium: 4.7 mmol/L (ref 3.5–5.1)
Sodium: 140 mmol/L (ref 135–145)
Total Bilirubin: 0.8 mg/dL (ref 0.3–1.2)
Total Protein: 6.8 g/dL (ref 6.5–8.1)

## 2022-11-21 LAB — CBC WITH DIFFERENTIAL/PLATELET
Abs Immature Granulocytes: 0.02 10*3/uL (ref 0.00–0.07)
Basophils Absolute: 0.1 10*3/uL (ref 0.0–0.1)
Basophils Relative: 1 %
Eosinophils Absolute: 0.3 10*3/uL (ref 0.0–0.5)
Eosinophils Relative: 3 %
HCT: 40.8 % (ref 36.0–46.0)
Hemoglobin: 12.8 g/dL (ref 12.0–15.0)
Immature Granulocytes: 0 %
Lymphocytes Relative: 15 %
Lymphs Abs: 1.2 10*3/uL (ref 0.7–4.0)
MCH: 30.3 pg (ref 26.0–34.0)
MCHC: 31.4 g/dL (ref 30.0–36.0)
MCV: 96.5 fL (ref 80.0–100.0)
Monocytes Absolute: 0.6 10*3/uL (ref 0.1–1.0)
Monocytes Relative: 8 %
Neutro Abs: 5.7 10*3/uL (ref 1.7–7.7)
Neutrophils Relative %: 73 %
Platelets: 246 10*3/uL (ref 150–400)
RBC: 4.23 MIL/uL (ref 3.87–5.11)
RDW: 16.2 % — ABNORMAL HIGH (ref 11.5–15.5)
WBC: 7.8 10*3/uL (ref 4.0–10.5)
nRBC: 0 % (ref 0.0–0.2)

## 2022-11-21 LAB — T4, FREE: Free T4: 0.63 ng/dL (ref 0.61–1.12)

## 2022-11-21 LAB — TSH: TSH: 3.384 u[IU]/mL (ref 0.350–4.500)

## 2022-11-22 ENCOUNTER — Encounter: Payer: Self-pay | Admitting: Oncology

## 2022-11-22 NOTE — Progress Notes (Signed)
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  Showed a irregular mass measuring 2.3 x 3.2 x 3.6 cm.  There were 2 other smaller masses in the left breast measuring 4 x 7 x 9 mm and 3 x 5 x 5 mm.  7 abnormal lymph nodes in the left axilla.  Indeterminate solid and cystic mass in the right breast in the retroareolar region measuring 1 x 1.2 x 1.9 cm as well as another 4.5 cm group of indeterminate microcalcifications in the lower right breast.  Patient had 2 left breast biopsy along with left lymph node biopsy.  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   PET CT scan showed hypermetabolic left breast mass and clusters of small but hypermetabolic left axillary and subpectoral lymph nodes.  1.2 x 0.9 cm subpleural nodule with an SUV of 1.6.   Patient was treated as per keynote 522 regimen starting on 01/09/2022. She had b/l mastectomy without reconstruction in February 2024.  Final pathology showedShe had 12 mm residual  tumor with 6 positive lymph nodes.  Patient had worsening cognitive status and after discussing with her niece who is her healthcare proxy and her primary care Dr. Hyacinth Meeker, adjuvant treatment including Keytruda Xeloda and radiation was not pursued.  Interval history-patient now lives in an assisted living.  Appetite is fair and weight has remained stable.  She denies any chest wall concerns at this time  ECOG PS- 2 Pain scale- 0   Review of systems- Review of Systems  Constitutional:  Negative for chills, fever, malaise/fatigue and weight loss.  HENT:  Negative for congestion, ear discharge and nosebleeds.   Eyes:  Negative for blurred vision.  Respiratory:  Negative for cough, hemoptysis, sputum production, shortness of breath and wheezing.   Cardiovascular:  Negative for chest pain, palpitations, orthopnea and claudication.  Gastrointestinal:  Negative for abdominal pain, blood in stool, constipation, diarrhea, heartburn, melena, nausea and vomiting.  Genitourinary:  Negative for dysuria, flank pain, frequency, hematuria and urgency.  Musculoskeletal:  Negative for back pain, joint pain and myalgias.  Skin:  Negative for rash.  Neurological:  Negative for dizziness, tingling, focal weakness, seizures, weakness and headaches.  Endo/Heme/Allergies:  Does not bruise/bleed easily.  Psychiatric/Behavioral:  Negative for depression and suicidal ideas. The patient does not have insomnia.       No Known Allergies   Past Medical History:  Diagnosis Date   Anemia    Anxiety    Arthritis    NECK   AVM (arteriovenous malformation) brain    Breast  cancer (HCC)    Cancer (HCC)    BASAL CELL AND MELANOMA   DAVF (dural arteriovenous fistula) 2015   Dementia (HCC)    DVT (deep venous thrombosis) (HCC)    Family history of adverse reaction to anesthesia    niece has to come out of anesthesia slow   HLD (hyperlipidemia)    Hypertension    PE (pulmonary thromboembolism) (HCC)    Pneumonia       Past Surgical History:  Procedure Laterality Date   ABDOMINAL HYSTERECTOMY     AXILLARY SENTINEL NODE BIOPSY Left 07/18/2022   Procedure: AXILLARY SENTINEL NODE BIOPSY;  Surgeon: Carolan Shiver, MD;  Location: [facility] ORS;  Service: General;  Laterality: Left;   BRAIN SURGERY  2015   DURAL AV FISTULA ([place] [facility])   BREAST BIOPSY Left 12/14/2021   [place] Bx 3:00 7 CMFN, Coil Clip, Path Pending   BREAST BIOPSY Left 12/14/2021   [place] Bx 10:00 2 CMFN, Venus Clip, path pending   breast biopsy Left 12/14/2021   [place] Axille, Hydromarker (butterfly). path pending   BREAST BIOPSY Right 12/19/2021   Stereo bx-calcs, "COIL" clip-path pending   BREAST BIOPSY Right 12/19/2021   u/s bx-mass, 3:00, retroareolar, "HEART" clip-path pending   BREAST BIOPSY Left 06/25/2022   [place] LT RADIO FREQUENCY TAG LOC US GUIDE 06/25/2022 [facility]   COLONOSCOPY     COLONOSCOPY WITH PROPOFOL N/A 02/20/2017   Procedure: COLONOSCOPY WITH PROPOFOL;  Surgeon: Scot Jun, MD;  Location: [facility] ENDOSCOPY;  Service: Endoscopy;  Laterality: N/A;   HYSTEROSCOPY WITH D & C N/A 07/29/2015   Procedure: DILATATION AND CURETTAGE /HYSTEROSCOPY;  Surgeon: Christeen Douglas, MD;  Location: [facility] ORS;  Service: Gynecology;  Laterality: N/A;   HYSTEROSCOPY WITH D & C N/A 01/24/2018   Procedure: DILATATION AND CURETTAGE /HYSTEROSCOPY;  Surgeon: Christeen Douglas, MD;  Location: [facility];  Service: Gynecology;  Laterality: N/A;   LAPAROSCOPY N/A 01/24/2018   Procedure: LAPAROSCOPY DIAGNOSTIC;  Surgeon: Christeen Douglas, MD;  Location: [facility];  Service: Gynecology;  Laterality: N/A;   MASTECTOMY W/ SENTINEL NODE BIOPSY Bilateral 07/18/2022   Procedure: MASTECTOMY WITH SENTINEL LYMPH NODE BIOPSY-left axillary RF tag;  Surgeon: Carolan Shiver, MD;  Location: [facility] OR[facility];  Service: General;  Laterality: Bilateral;   PERIPHERAL VASCULAR THROMBECTOMY N/A 02/29/2020   Procedure: PERIPHERAL VASCULAR THROMBECTOMY / THROMBOLYSIS WITH  POSSIBLE PULMONARY THROMBECTOMY / THROMBOLYSIS;  Surgeon: Annice Needy, MD;  Location: [facility] INVASIVE CV LAB;  Service: Cardiovascular;  Laterality: N/A;   PORTACATH PLACEMENT N/A 01/08/2022   Procedure: INSERTION PORT-A-CATH;  Surgeon: Carolan Shiver, MD;  Location: [facility];  Service: General;  Laterality: N/A;   TONSILLECTOMY     AGE 75   TOTAL LAPAROSCOPIC HYSTERECTOMY WITH BILATERAL SALPINGO OOPHORECTOMY Bilateral 12/28/2019   Procedure: TOTAL LAPAROSCOPIC HYSTERECTOMY WITH BILATERAL SALPINGO OOPHORECTOMY;  Surgeon: Christeen Douglas, MD;  Location: [facility] OR[facility];  Service: Gynecology;  Laterality: Bilateral;   TUBAL LIGATION      Social History   Socioeconomic History   Marital status: Divorced    Spouse name: Not on file   Number of children: Not on file   Years of education: Not on file   Highest education level: Not on file  Occupational History   Not on file  Tobacco Use   Smoking status: Former    Packs/day: 0.50    Years: 15.00    Additional pack years: 0.00    Total pack years: 7.50    Types: Cigarettes    Quit date: 07/21/2000  Years since quitting: 22.3   Smokeless tobacco: Never  Vaping Use   Vaping Use: Never used  Substance and Sexual Activity   Alcohol use: Not Currently   Drug use: No   Sexual activity: Not Currently  Other Topics Concern   Not on file  Social History Narrative   Lives alone, caregiver 42 year old daughter   Social Determinants of Health   Financial Resource Strain: Not on file  Food Insecurity: No Food Insecurity (07/18/2022)   Hunger Vital Sign    Worried About Running Out of Food in the Last Year: Never true    Ran Out of Food in the Last Year: Never true  Transportation Needs: No Transportation Needs (07/18/2022)   PRAPARE - Administrator, Civil Service (Medical): No    Lack of Transportation (Non-Medical): No  Physical Activity: Not on file  Stress: Not on file  Social Connections: Not on file  Intimate Partner  Violence: Patient Declined (07/18/2022)   Humiliation, Afraid, Rape, and Kick questionnaire    Fear of Current or Ex-Partner: Patient declined    Emotionally Abused: Patient declined    Physically Abused: Patient declined    Sexually Abused: Patient declined    Family History  Problem Relation Age of Onset   Stomach cancer Sister    Hypertension Sister    Hypertension Sister    Diabetes Niece    Hypertension Niece    Breast cancer Neg Hx      Current Outpatient Medications:    buPROPion (WELLBUTRIN XL) 150 MG 24 hr tablet, Take 150 mg by mouth every morning., Disp: , Rfl: 6   cetirizine (ZYRTEC) 10 MG tablet, Take 10 mg by mouth daily as needed for allergies., Disp: , Rfl:    divalproex (DEPAKOTE) 500 MG DR tablet, Take 500 mg by mouth 2 (two) times daily., Disp: , Rfl:    DULoxetine (CYMBALTA) 20 MG capsule, Take 1 capsule (20 mg total) by mouth daily., Disp: 30 capsule, Rfl: 3   haloperidol (HALDOL) 0.5 MG tablet, Take 0.5 mg by mouth 2 (two) times daily as needed., Disp: , Rfl:    lidocaine-prilocaine (EMLA) cream, Apply 1 Application topically as needed., Disp: , Rfl:    LORazepam (ATIVAN) 0.5 MG tablet, Take by mouth., Disp: , Rfl:    OLANZapine (ZYPREXA) 2.5 MG tablet, Take 2.5 mg by mouth at bedtime., Disp: , Rfl:   Physical exam:  Vitals:   11/21/22 1100  BP: 131/79  Pulse: 77  Resp: 16  Temp: (!) 97.3 F (36.3 C)  TempSrc: Tympanic  SpO2: 100%  Weight: 96 lb 11.2 oz (43.9 kg)  Height: 5\' 2"  (1.575 m)   Physical Exam Cardiovascular:     Rate and Rhythm: Normal rate and regular rhythm.     Heart sounds: Normal heart sounds.  Pulmonary:     Effort: Pulmonary effort is normal.     Breath sounds: Normal breath sounds.  Abdominal:     General: Bowel sounds are normal.     Palpations: Abdomen is soft.  Skin:    General: Skin is warm and dry.  Neurological:     Mental Status: She is alert and oriented to person, place, and time.   Chest wall Exam: Patient is  s/p bilateral mastectomy without reconstruction.  No evidence of chest wall recurrence.  No palpable bilateral axillary adenopathy.     Latest Ref Rng & Units 11/21/2022   10:50 AM  CMP  Glucose 70 - 99 mg/dL  104   BUN 8 - 23 mg/dL 15   Creatinine 1.61 - 1.00 mg/dL 0.96   Sodium 045 - 409 mmol/L 140   Potassium 3.5 - 5.1 mmol/L 4.7   Chloride 98 - 111 mmol/L 104   CO2 22 - 32 mmol/L 27   Calcium 8.9 - 10.3 mg/dL 9.1   Total Protein 6.5 - 8.1 g/dL 6.8   Total Bilirubin 0.3 - 1.2 mg/dL 0.8   Alkaline Phos 38 - 126 U/L 92   AST 15 - 41 U/L 22   ALT 0 - 44 U/L 12       Latest Ref Rng & Units 11/21/2022   10:50 AM  CBC  WBC 4.0 - 10.5 K/uL 7.8   Hemoglobin 12.0 - 15.0 g/dL 81.1   Hematocrit 91.4 - 46.0 % 40.8   Platelets 150 - 400 K/uL 246      Assessment and plan- Patient is a 77 y.o. 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  She underwent neoadjuvant chemotherapy as per keynote 522 regimen followed by bilateral mastectomy.  She did not get any adjuvant treatment due to declining cognitive status.  Clinically patient is doing well with no concerning signs and symptoms of recurrence based on today's exam.  Typically routine surveillance imaging is not indicated for nonmetastatic breast cancer unless there are any concerning signs and symptoms.  Given patient's declining cognitive status I discussed that she can continue to follow-up with her primary care Dr. Hyacinth Meeker at this time which her niece agrees.  She does not require any routine surveillance labs either.  She can be referred to me in the future if questions or concerns arise   Visit Diagnosis 1. Encounter for follow-up surveillance of breast cancer      Dr. Owens Shark, MD, MPH [facility] [phone] 11/22/2022 11:31 AM

## 2022-11-30 DIAGNOSIS — F5105 Insomnia due to other mental disorder: Secondary | ICD-10-CM | POA: Diagnosis not present

## 2022-11-30 DIAGNOSIS — F330 Major depressive disorder, recurrent, mild: Secondary | ICD-10-CM | POA: Diagnosis not present

## 2022-11-30 DIAGNOSIS — G309 Alzheimer's disease, unspecified: Secondary | ICD-10-CM | POA: Diagnosis not present

## 2022-11-30 DIAGNOSIS — F4320 Adjustment disorder, unspecified: Secondary | ICD-10-CM | POA: Diagnosis not present

## 2022-11-30 DIAGNOSIS — R451 Restlessness and agitation: Secondary | ICD-10-CM | POA: Diagnosis not present

## 2022-12-09 DIAGNOSIS — F22 Delusional disorders: Secondary | ICD-10-CM | POA: Diagnosis not present

## 2022-12-09 DIAGNOSIS — E46 Unspecified protein-calorie malnutrition: Secondary | ICD-10-CM | POA: Diagnosis not present

## 2022-12-10 DIAGNOSIS — E46 Unspecified protein-calorie malnutrition: Secondary | ICD-10-CM | POA: Diagnosis not present

## 2022-12-10 DIAGNOSIS — F22 Delusional disorders: Secondary | ICD-10-CM | POA: Diagnosis not present

## 2022-12-10 DIAGNOSIS — G309 Alzheimer's disease, unspecified: Secondary | ICD-10-CM | POA: Diagnosis not present

## 2022-12-10 DIAGNOSIS — Z7189 Other specified counseling: Secondary | ICD-10-CM | POA: Diagnosis not present

## 2022-12-10 DIAGNOSIS — Z681 Body mass index (BMI) 19 or less, adult: Secondary | ICD-10-CM | POA: Diagnosis not present

## 2022-12-10 DIAGNOSIS — Z79899 Other long term (current) drug therapy: Secondary | ICD-10-CM | POA: Diagnosis not present

## 2022-12-10 DIAGNOSIS — F02B4 Dementia in other diseases classified elsewhere, moderate, with anxiety: Secondary | ICD-10-CM | POA: Diagnosis not present

## 2022-12-10 DIAGNOSIS — R636 Underweight: Secondary | ICD-10-CM | POA: Diagnosis not present

## 2022-12-20 DIAGNOSIS — R451 Restlessness and agitation: Secondary | ICD-10-CM | POA: Diagnosis not present

## 2022-12-20 DIAGNOSIS — G309 Alzheimer's disease, unspecified: Secondary | ICD-10-CM | POA: Diagnosis not present

## 2022-12-27 DIAGNOSIS — F330 Major depressive disorder, recurrent, mild: Secondary | ICD-10-CM | POA: Diagnosis not present

## 2022-12-27 DIAGNOSIS — G309 Alzheimer's disease, unspecified: Secondary | ICD-10-CM | POA: Diagnosis not present

## 2022-12-27 DIAGNOSIS — R451 Restlessness and agitation: Secondary | ICD-10-CM | POA: Diagnosis not present

## 2022-12-27 DIAGNOSIS — F02B4 Dementia in other diseases classified elsewhere, moderate, with anxiety: Secondary | ICD-10-CM | POA: Diagnosis not present

## 2022-12-28 DIAGNOSIS — F330 Major depressive disorder, recurrent, mild: Secondary | ICD-10-CM | POA: Diagnosis not present

## 2022-12-28 DIAGNOSIS — F4321 Adjustment disorder with depressed mood: Secondary | ICD-10-CM | POA: Diagnosis not present

## 2022-12-31 DIAGNOSIS — F5105 Insomnia due to other mental disorder: Secondary | ICD-10-CM | POA: Diagnosis not present

## 2022-12-31 DIAGNOSIS — R451 Restlessness and agitation: Secondary | ICD-10-CM | POA: Diagnosis not present

## 2022-12-31 DIAGNOSIS — F4320 Adjustment disorder, unspecified: Secondary | ICD-10-CM | POA: Diagnosis not present

## 2022-12-31 DIAGNOSIS — F330 Major depressive disorder, recurrent, mild: Secondary | ICD-10-CM | POA: Diagnosis not present

## 2022-12-31 DIAGNOSIS — G309 Alzheimer's disease, unspecified: Secondary | ICD-10-CM | POA: Diagnosis not present

## 2023-01-10 DIAGNOSIS — Z681 Body mass index (BMI) 19 or less, adult: Secondary | ICD-10-CM | POA: Diagnosis not present

## 2023-01-10 DIAGNOSIS — N182 Chronic kidney disease, stage 2 (mild): Secondary | ICD-10-CM | POA: Diagnosis not present

## 2023-01-10 DIAGNOSIS — E782 Mixed hyperlipidemia: Secondary | ICD-10-CM | POA: Diagnosis not present

## 2023-01-10 DIAGNOSIS — I129 Hypertensive chronic kidney disease with stage 1 through stage 4 chronic kidney disease, or unspecified chronic kidney disease: Secondary | ICD-10-CM | POA: Diagnosis not present

## 2023-01-10 DIAGNOSIS — E46 Unspecified protein-calorie malnutrition: Secondary | ICD-10-CM | POA: Diagnosis not present

## 2023-01-21 DIAGNOSIS — B351 Tinea unguium: Secondary | ICD-10-CM | POA: Diagnosis not present

## 2023-01-21 DIAGNOSIS — I7091 Generalized atherosclerosis: Secondary | ICD-10-CM | POA: Diagnosis not present

## 2023-01-28 DIAGNOSIS — G309 Alzheimer's disease, unspecified: Secondary | ICD-10-CM | POA: Diagnosis not present

## 2023-01-28 DIAGNOSIS — F02818 Dementia in other diseases classified elsewhere, unspecified severity, with other behavioral disturbance: Secondary | ICD-10-CM | POA: Diagnosis not present

## 2023-01-28 DIAGNOSIS — F4323 Adjustment disorder with mixed anxiety and depressed mood: Secondary | ICD-10-CM | POA: Diagnosis not present

## 2023-02-05 DIAGNOSIS — G309 Alzheimer's disease, unspecified: Secondary | ICD-10-CM | POA: Diagnosis not present

## 2023-02-05 DIAGNOSIS — M159 Polyosteoarthritis, unspecified: Secondary | ICD-10-CM | POA: Diagnosis not present

## 2023-02-05 DIAGNOSIS — F02B4 Dementia in other diseases classified elsewhere, moderate, with anxiety: Secondary | ICD-10-CM | POA: Diagnosis not present

## 2023-02-05 DIAGNOSIS — G4700 Insomnia, unspecified: Secondary | ICD-10-CM | POA: Diagnosis not present

## 2023-02-06 DIAGNOSIS — F4321 Adjustment disorder with depressed mood: Secondary | ICD-10-CM | POA: Diagnosis not present

## 2023-02-06 DIAGNOSIS — F330 Major depressive disorder, recurrent, mild: Secondary | ICD-10-CM | POA: Diagnosis not present

## 2023-02-14 DIAGNOSIS — I444 Left anterior fascicular block: Secondary | ICD-10-CM | POA: Diagnosis not present

## 2023-02-14 DIAGNOSIS — R009 Unspecified abnormalities of heart beat: Secondary | ICD-10-CM | POA: Diagnosis not present

## 2023-02-14 DIAGNOSIS — R109 Unspecified abdominal pain: Secondary | ICD-10-CM | POA: Diagnosis not present

## 2023-02-14 DIAGNOSIS — I499 Cardiac arrhythmia, unspecified: Secondary | ICD-10-CM | POA: Diagnosis not present

## 2023-02-14 DIAGNOSIS — R195 Other fecal abnormalities: Secondary | ICD-10-CM | POA: Diagnosis not present

## 2023-02-14 DIAGNOSIS — K5900 Constipation, unspecified: Secondary | ICD-10-CM | POA: Diagnosis not present

## 2023-02-15 ENCOUNTER — Other Ambulatory Visit: Payer: Self-pay

## 2023-02-15 ENCOUNTER — Emergency Department: Payer: PPO

## 2023-02-15 ENCOUNTER — Inpatient Hospital Stay
Admission: EM | Admit: 2023-02-15 | Discharge: 2023-02-18 | DRG: 291 | Disposition: A | Discharge status: Home Health | Payer: PPO | Source: Skilled Nursing Facility | Attending: Internal Medicine | Admitting: Internal Medicine

## 2023-02-15 DIAGNOSIS — Z171 Estrogen receptor negative status [ER-]: Secondary | ICD-10-CM | POA: Diagnosis not present

## 2023-02-15 DIAGNOSIS — Z9013 Acquired absence of bilateral breasts and nipples: Secondary | ICD-10-CM | POA: Diagnosis not present

## 2023-02-15 DIAGNOSIS — R269 Unspecified abnormalities of gait and mobility: Secondary | ICD-10-CM | POA: Diagnosis not present

## 2023-02-15 DIAGNOSIS — C50412 Malignant neoplasm of upper-outer quadrant of left female breast: Secondary | ICD-10-CM

## 2023-02-15 DIAGNOSIS — I2699 Other pulmonary embolism without acute cor pulmonale: Secondary | ICD-10-CM | POA: Diagnosis not present

## 2023-02-15 DIAGNOSIS — M7989 Other specified soft tissue disorders: Secondary | ICD-10-CM | POA: Diagnosis not present

## 2023-02-15 DIAGNOSIS — I5022 Chronic systolic (congestive) heart failure: Secondary | ICD-10-CM | POA: Diagnosis not present

## 2023-02-15 DIAGNOSIS — I493 Ventricular premature depolarization: Secondary | ICD-10-CM | POA: Diagnosis present

## 2023-02-15 DIAGNOSIS — F32A Depression, unspecified: Secondary | ICD-10-CM | POA: Diagnosis present

## 2023-02-15 DIAGNOSIS — G309 Alzheimer's disease, unspecified: Secondary | ICD-10-CM | POA: Diagnosis not present

## 2023-02-15 DIAGNOSIS — I2781 Cor pulmonale (chronic): Secondary | ICD-10-CM | POA: Diagnosis present

## 2023-02-15 DIAGNOSIS — Z9079 Acquired absence of other genital organ(s): Secondary | ICD-10-CM

## 2023-02-15 DIAGNOSIS — I3139 Other pericardial effusion (noninflammatory): Secondary | ICD-10-CM | POA: Diagnosis present

## 2023-02-15 DIAGNOSIS — I5031 Acute diastolic (congestive) heart failure: Secondary | ICD-10-CM | POA: Diagnosis not present

## 2023-02-15 DIAGNOSIS — Z8249 Family history of ischemic heart disease and other diseases of the circulatory system: Secondary | ICD-10-CM | POA: Diagnosis not present

## 2023-02-15 DIAGNOSIS — I959 Hypotension, unspecified: Secondary | ICD-10-CM | POA: Diagnosis not present

## 2023-02-15 DIAGNOSIS — Z86718 Personal history of other venous thrombosis and embolism: Secondary | ICD-10-CM | POA: Diagnosis not present

## 2023-02-15 DIAGNOSIS — R600 Localized edema: Principal | ICD-10-CM

## 2023-02-15 DIAGNOSIS — Z90710 Acquired absence of both cervix and uterus: Secondary | ICD-10-CM | POA: Diagnosis not present

## 2023-02-15 DIAGNOSIS — J984 Other disorders of lung: Secondary | ICD-10-CM | POA: Diagnosis not present

## 2023-02-15 DIAGNOSIS — E782 Mixed hyperlipidemia: Secondary | ICD-10-CM | POA: Diagnosis not present

## 2023-02-15 DIAGNOSIS — Z800 Family history of malignant neoplasm of digestive organs: Secondary | ICD-10-CM | POA: Diagnosis not present

## 2023-02-15 DIAGNOSIS — F0283 Dementia in other diseases classified elsewhere, unspecified severity, with mood disturbance: Secondary | ICD-10-CM | POA: Diagnosis not present

## 2023-02-15 DIAGNOSIS — R9431 Abnormal electrocardiogram [ECG] [EKG]: Secondary | ICD-10-CM | POA: Diagnosis not present

## 2023-02-15 DIAGNOSIS — I10 Essential (primary) hypertension: Secondary | ICD-10-CM | POA: Diagnosis present

## 2023-02-15 DIAGNOSIS — Z66 Do not resuscitate: Secondary | ICD-10-CM | POA: Diagnosis not present

## 2023-02-15 DIAGNOSIS — F0284 Dementia in other diseases classified elsewhere, unspecified severity, with anxiety: Secondary | ICD-10-CM | POA: Diagnosis present

## 2023-02-15 DIAGNOSIS — Z833 Family history of diabetes mellitus: Secondary | ICD-10-CM

## 2023-02-15 DIAGNOSIS — I517 Cardiomegaly: Secondary | ICD-10-CM | POA: Diagnosis not present

## 2023-02-15 DIAGNOSIS — Z79899 Other long term (current) drug therapy: Secondary | ICD-10-CM

## 2023-02-15 DIAGNOSIS — R0600 Dyspnea, unspecified: Secondary | ICD-10-CM

## 2023-02-15 DIAGNOSIS — I110 Hypertensive heart disease with heart failure: Secondary | ICD-10-CM | POA: Diagnosis not present

## 2023-02-15 DIAGNOSIS — Z87891 Personal history of nicotine dependence: Secondary | ICD-10-CM

## 2023-02-15 DIAGNOSIS — I2782 Chronic pulmonary embolism: Secondary | ICD-10-CM | POA: Diagnosis present

## 2023-02-15 DIAGNOSIS — J90 Pleural effusion, not elsewhere classified: Secondary | ICD-10-CM | POA: Diagnosis not present

## 2023-02-15 DIAGNOSIS — Z85828 Personal history of other malignant neoplasm of skin: Secondary | ICD-10-CM | POA: Diagnosis not present

## 2023-02-15 DIAGNOSIS — Z85820 Personal history of malignant melanoma of skin: Secondary | ICD-10-CM | POA: Diagnosis not present

## 2023-02-15 DIAGNOSIS — Z853 Personal history of malignant neoplasm of breast: Secondary | ICD-10-CM | POA: Diagnosis not present

## 2023-02-15 DIAGNOSIS — F0280 Dementia in other diseases classified elsewhere without behavioral disturbance: Secondary | ICD-10-CM | POA: Diagnosis present

## 2023-02-15 DIAGNOSIS — R635 Abnormal weight gain: Secondary | ICD-10-CM | POA: Diagnosis not present

## 2023-02-15 DIAGNOSIS — F419 Anxiety disorder, unspecified: Secondary | ICD-10-CM | POA: Diagnosis not present

## 2023-02-15 DIAGNOSIS — Z90722 Acquired absence of ovaries, bilateral: Secondary | ICD-10-CM

## 2023-02-15 DIAGNOSIS — Z8589 Personal history of malignant neoplasm of other organs and systems: Secondary | ICD-10-CM

## 2023-02-15 DIAGNOSIS — E785 Hyperlipidemia, unspecified: Secondary | ICD-10-CM | POA: Diagnosis not present

## 2023-02-15 DIAGNOSIS — Z9221 Personal history of antineoplastic chemotherapy: Secondary | ICD-10-CM | POA: Diagnosis not present

## 2023-02-15 DIAGNOSIS — K5900 Constipation, unspecified: Secondary | ICD-10-CM | POA: Diagnosis not present

## 2023-02-15 DIAGNOSIS — I509 Heart failure, unspecified: Secondary | ICD-10-CM

## 2023-02-15 DIAGNOSIS — I5041 Acute combined systolic (congestive) and diastolic (congestive) heart failure: Secondary | ICD-10-CM | POA: Diagnosis not present

## 2023-02-15 LAB — COMPREHENSIVE METABOLIC PANEL
ALT: 37 U/L (ref 0–44)
AST: 47 U/L — ABNORMAL HIGH (ref 15–41)
Albumin: 3.8 g/dL (ref 3.5–5.0)
Alkaline Phosphatase: 88 U/L (ref 38–126)
Anion gap: 10 (ref 5–15)
BUN: 17 mg/dL (ref 8–23)
CO2: 25 mmol/L (ref 22–32)
Calcium: 9.2 mg/dL (ref 8.9–10.3)
Chloride: 107 mmol/L (ref 98–111)
Creatinine, Ser: 0.86 mg/dL (ref 0.44–1.00)
GFR, Estimated: >60 mL/min (ref >60)
Glucose, Bld: 110 mg/dL — ABNORMAL HIGH (ref 70–99)
Potassium: 3.8 mmol/L (ref 3.5–5.1)
Sodium: 142 mmol/L (ref 135–145)
Total Bilirubin: 1 mg/dL (ref 0.3–1.2)
Total Protein: 6.8 g/dL (ref 6.5–8.1)

## 2023-02-15 LAB — D-DIMER, QUANTITATIVE: D-Dimer, Quant: 2.27 ug{FEU}/mL — ABNORMAL HIGH (ref 0.00–0.50)

## 2023-02-15 LAB — CBC
HCT: 47.3 % — ABNORMAL HIGH (ref 36.0–46.0)
Hemoglobin: 14.9 g/dL (ref 12.0–15.0)
MCH: 29 pg (ref 26.0–34.0)
MCHC: 31.5 g/dL (ref 30.0–36.0)
MCV: 92.2 fL (ref 80.0–100.0)
Platelets: 202 10*3/uL (ref 150–400)
RBC: 5.13 MIL/uL — ABNORMAL HIGH (ref 3.87–5.11)
RDW: 14.6 % (ref 11.5–15.5)
WBC: 5.5 10*3/uL (ref 4.0–10.5)
nRBC: 0 % (ref 0.0–0.2)

## 2023-02-15 LAB — BRAIN NATRIURETIC PEPTIDE: B Natriuretic Peptide: 3557.9 pg/mL — ABNORMAL HIGH (ref 0.0–100.0)

## 2023-02-15 LAB — PROTIME-INR
INR: 1.1 (ref 0.8–1.2)
Prothrombin Time: 14.5 s (ref 11.4–15.2)

## 2023-02-15 LAB — APTT: aPTT: 29 s (ref 24–36)

## 2023-02-15 LAB — TSH: TSH: 5.669 u[IU]/mL — ABNORMAL HIGH (ref 0.350–4.500)

## 2023-02-15 MED ORDER — HEPARIN (PORCINE) 25000 UT/250ML-% IV SOLN
800.0000 [IU]/h | INTRAVENOUS | Status: AC
  Administered 2023-02-15 – 2023-02-16 (×2): 800 [IU]/h via INTRAVENOUS
  Filled 2023-02-15 (×2): qty 250

## 2023-02-15 MED ORDER — ENOXAPARIN SODIUM 40 MG/0.4ML IJ SOSY: 40.0000 mg | PREFILLED_SYRINGE | INTRAMUSCULAR | Status: DC

## 2023-02-15 MED ORDER — HEPARIN BOLUS VIA INFUSION
3400.0000 [IU] | Freq: Once | INTRAVENOUS | Status: AC
  Administered 2023-02-15: 3400 [IU] via INTRAVENOUS
  Filled 2023-02-15: qty 3400

## 2023-02-15 MED ORDER — FUROSEMIDE 10 MG/ML IJ SOLN
40.0000 mg | Freq: Once | INTRAMUSCULAR | Status: AC
  Administered 2023-02-15: 40 mg via INTRAVENOUS
  Filled 2023-02-15: qty 4

## 2023-02-15 MED ORDER — SODIUM CHLORIDE 0.9% FLUSH: 3.0000 mL | INTRAVENOUS | Status: DC | PRN

## 2023-02-15 MED ORDER — DULOXETINE HCL 20 MG PO CPEP: 20.0000 mg | ORAL_CAPSULE | Freq: Every day | ORAL | Status: DC

## 2023-02-15 MED ORDER — BUPROPION HCL ER (XL) 150 MG PO TB24: 150.0000 mg | ORAL_TABLET | ORAL | Status: DC

## 2023-02-15 MED ORDER — ONDANSETRON HCL 4 MG/2ML IJ SOLN: 4.0000 mg | Freq: Four times a day (QID) | INTRAMUSCULAR | Status: DC | PRN

## 2023-02-15 MED ORDER — ACETAMINOPHEN 325 MG PO TABS
650.0000 mg | ORAL_TABLET | ORAL | Status: DC | PRN
  Administered 2023-02-16: 650 mg via ORAL
  Filled 2023-02-15: qty 2

## 2023-02-15 MED ORDER — FUROSEMIDE 10 MG/ML IJ SOLN
40.0000 mg | Freq: Two times a day (BID) | INTRAMUSCULAR | Status: DC
  Administered 2023-02-16 (×2): 40 mg via INTRAVENOUS
  Filled 2023-02-15 (×2): qty 4

## 2023-02-15 MED ORDER — IOHEXOL 350 MG/ML SOLN
75.0000 mL | Freq: Once | INTRAVENOUS | Status: AC | PRN
  Administered 2023-02-15: 75 mL via INTRAVENOUS

## 2023-02-15 MED ORDER — SODIUM CHLORIDE 0.9% FLUSH
3.0000 mL | Freq: Two times a day (BID) | INTRAVENOUS | Status: DC
  Administered 2023-02-15 – 2023-02-17 (×4): 3 mL via INTRAVENOUS

## 2023-02-15 MED ORDER — DIVALPROEX SODIUM 500 MG PO DR TAB: 500.0000 mg | DELAYED_RELEASE_TABLET | Freq: Two times a day (BID) | ORAL | Status: DC

## 2023-02-15 MED ORDER — SODIUM CHLORIDE 0.9 % IV SOLN: 250.0000 mL | INTRAVENOUS | Status: DC | PRN

## 2023-02-15 NOTE — Progress Notes (Signed)
ANTICOAGULATION CONSULT NOTE  Pharmacy Consult for heparin infusion Indication: VTE Tx / PE  No Known Allergies  Patient Measurements: Height: 5\' 2"  (157.5 cm) Weight: 49 kg (108 lb) IBW/kg (Calculated) : 50.1 Heparin Dosing Weight: 49 kg  Vital Signs: Temp: 97.5 F (36.4 C) (09/06 2159) Temp Source: Oral (09/06 2159) BP: 154/84 (09/06 2159) Pulse Rate: 72 (09/06 2159)  Labs: Recent Labs    02/15/23 1708  HGB 14.9  HCT 47.3*  PLT 202  CREATININE 0.86    Estimated Creatinine Clearance: 41 mL/min (by C-G formula based on SCr of 0.86 mg/dL).   Medical History: Past Medical History:  Diagnosis Date   Anemia    Anxiety    Arthritis    NECK   AVM (arteriovenous malformation) brain    Breast cancer (HCC)    Cancer (HCC)    BASAL CELL AND MELANOMA   DAVF (dural arteriovenous fistula) 2015   Dementia (HCC)    DVT (deep venous thrombosis) (HCC)    Family history of adverse reaction to anesthesia    niece has to come out of anesthesia slow   HLD (hyperlipidemia)    Hypertension    PE (pulmonary thromboembolism) (HCC)    Pneumonia     Assessment: Pt is a 77 yo female presenting to ED from SNF d/t abnormal EKG and 8lb weight gain in 30 days, found with "marked severity right-sided pulmonary embolism with associated mild right heart strain."  Goal of Therapy:  Heparin level 0.3-0.7 units/ml Monitor platelets by anticoagulation protocol: Yes   Plan:  Bolus 3400 units x 1 Start heparin infusion at 800 units/hr Will check HL in 8 hr after start of infusion CBC daily while on heparin  Otelia Sergeant, PharmD, [facility] 02/15/2023 10:47 PM

## 2023-02-15 NOTE — H&P (Addendum)
History and Physical    Patient: Patricia Miller WJX:[id number] DOB: 1945-09-15 DOA: 02/15/2023 DOS: the patient was seen and examined on 02/15/2023 PCP: Danella Penton, MD  Patient coming from: SNF  Chief Complaint:  Chief Complaint  Patient presents with   Weight Gain   HPI: Patricia Miller is a 77 y.o. female with medical history significant of basal cell carcinoma, dementia, previous DVT and PE with right heart strain, hyperlipidemia, essential hypertension, anxiety disorder, previous pneumonia, anxiety disorder, depression, history of breast cancer, who presents from [place] to memory care with healthcare power of attorney who is her niece send the patient has had 8 pound weight gain and has abnormal EKG in the last 30 days.  Patient was seen in the ER.  Has some shortness of breath but not hypoxic.  Has bilateral lower extremity edema that is persistent and heavy.  Patient with no prior history of CHF.  Appears to have new onset CHF.  No prior echo in the system.  Patient also has have past history of DVTs as indicated.  She has had PE with cor pulmonale.  D-dimer is elevated 2.27.  Workup being done for possible PE workup.  Review of Systems: As mentioned in the history of present illness. All other systems reviewed and are negative. Past Medical History:  Diagnosis Date   Anemia    Anxiety    Arthritis    NECK   AVM (arteriovenous malformation) brain    Breast cancer (HCC)    Cancer (HCC)    BASAL CELL AND MELANOMA   DAVF (dural arteriovenous fistula) 2015   Dementia (HCC)    DVT (deep venous thrombosis) (HCC)    Family history of adverse reaction to anesthesia    niece has to come out of anesthesia slow   HLD (hyperlipidemia)    Hypertension    PE (pulmonary thromboembolism) (HCC)    Pneumonia    Past Surgical History:  Procedure Laterality Date   ABDOMINAL HYSTERECTOMY     AXILLARY SENTINEL NODE BIOPSY Left 07/18/2022   Procedure: AXILLARY SENTINEL NODE  BIOPSY;  Surgeon: Carolan Shiver, MD;  Location: [facility] OR[facility];  Service: General;  Laterality: Left;   BRAIN SURGERY  2015   DURAL AV FISTULA ([place] [facility])   BREAST BIOPSY Left 12/14/2021   [place] Bx 3:00 7 CMFN, Coil Clip, Path Pending   BREAST BIOPSY Left 12/14/2021   [place] Bx 10:00 2 CMFN, Venus Clip, path pending   breast biopsy Left 12/14/2021   [place] Axille, Hydromarker (butterfly). path pending   BREAST BIOPSY Right 12/19/2021   Stereo bx-calcs, "COIL" clip-path pending   BREAST BIOPSY Right 12/19/2021   u/s bx-mass, 3:00, retroareolar, "HEART" clip-path pending   BREAST BIOPSY Left 06/25/2022   [place] LT RADIO FREQUENCY TAG LOC US GUIDE 06/25/2022 [facility]   COLONOSCOPY     COLONOSCOPY WITH PROPOFOL N/A 02/20/2017   Procedure: COLONOSCOPY WITH PROPOFOL;  Surgeon: Scot Jun, MD;  Location: [facility] ENDOSCOPY;  Service: Endoscopy;  Laterality: N/A;   HYSTEROSCOPY WITH D & C N/A 07/29/2015   Procedure: DILATATION AND CURETTAGE /HYSTEROSCOPY;  Surgeon: Christeen Douglas, MD;  Location: [facility] ORS;  Service: Gynecology;  Laterality: N/A;   HYSTEROSCOPY WITH D & C N/A 01/24/2018   Procedure: DILATATION AND CURETTAGE /HYSTEROSCOPY;  Surgeon: Christeen Douglas, MD;  Location: [facility];  Service: Gynecology;  Laterality: N/A;   LAPAROSCOPY N/A 01/24/2018   Procedure: LAPAROSCOPY DIAGNOSTIC;  Surgeon: Christeen Douglas, MD;  Location: [facility];  Service:  Gynecology;  Laterality: N/A;   MASTECTOMY W/ SENTINEL NODE BIOPSY Bilateral 07/18/2022   Procedure: MASTECTOMY WITH SENTINEL LYMPH NODE BIOPSY-left axillary RF tag;  Surgeon: Carolan Shiver, MD;  Location: [facility];  Service: General;  Laterality: Bilateral;   PERIPHERAL VASCULAR THROMBECTOMY N/A 02/29/2020   Procedure: PERIPHERAL VASCULAR THROMBECTOMY / THROMBOLYSIS WITH POSSIBLE PULMONARY THROMBECTOMY / THROMBOLYSIS;  Surgeon: Annice Needy, MD;  Location: [facility] CV [facility];  Service: Cardiovascular;  Laterality: N/A;   PORTACATH  PLACEMENT N/A 01/08/2022   Procedure: INSERTION PORT-A-CATH;  Surgeon: Carolan Shiver, MD;  Location: [facility];  Service: General;  Laterality: N/A;   TONSILLECTOMY     AGE 25   TOTAL LAPAROSCOPIC HYSTERECTOMY WITH BILATERAL SALPINGO OOPHORECTOMY Bilateral 12/28/2019   Procedure: TOTAL LAPAROSCOPIC HYSTERECTOMY WITH BILATERAL SALPINGO OOPHORECTOMY;  Surgeon: Christeen Douglas, MD;  Location: [facility] ORS;  Service: Gynecology;  Laterality: Bilateral;   TUBAL LIGATION     Social History:  reports that she quit smoking about 22 years ago. Her smoking use included cigarettes. She started smoking about 37 years ago. She has a 7.5 pack-year smoking history. She has never used smokeless tobacco. She reports that she does not currently use alcohol. She reports that she does not use drugs.  No Known Allergies  Family History  Problem Relation Age of Onset   Stomach cancer Sister    Hypertension Sister    Hypertension Sister    Diabetes Niece    Hypertension Niece    Breast cancer Neg Hx     Prior to Admission medications   Medication Sig Start Date End Date Taking? Authorizing Provider  buPROPion (WELLBUTRIN XL) 150 MG 24 hr tablet Take 150 mg by mouth every morning. 01/05/18   [provider]  cetirizine (ZYRTEC) 10 MG tablet Take 10 mg by mouth daily as needed for allergies.    [provider]  divalproex (DEPAKOTE) 500 MG DR tablet Take 500 mg by mouth 2 (two) times daily. 08/11/22   [provider]  DULoxetine (CYMBALTA) 20 MG capsule Take 1 capsule (20 mg total) by mouth daily. 07/14/22   Creig Hines, MD  haloperidol (HALDOL) 0.5 MG tablet Take 0.5 mg by mouth 2 (two) times daily as needed. 10/08/22   [provider]  lidocaine-prilocaine (EMLA) cream Apply 1 Application topically as needed.    [provider]  LORazepam (ATIVAN) 0.5 MG tablet Take by mouth. 10/15/22 04/13/23  [provider]  OLANZapine (ZYPREXA) 2.5 MG tablet Take 2.5 mg  by mouth at bedtime. 10/10/22   [provider]    Physical Exam: Vitals:   02/15/23 1656 02/15/23 1702 02/15/23 2000  BP: 114/87  97/86  Pulse: 97  80  Resp: 18  (!) 23  Temp: 97.9 F (36.6 C)    TempSrc: Oral    SpO2: 99%  99%  Weight:  49 kg   Height:  5\' 2"  (1.575 m)    Constitutional: Chronically ill looking, NAD, calm, comfortable Eyes: PERRL, lids and conjunctivae normal ENMT: Mucous membranes are moist. Posterior pharynx clear of any exudate or lesions.Normal dentition.  Neck: normal, supple, no masses, no thyromegaly Respiratory: clear to auscultation bilaterally, no wheezing, diffuse lower lung crackles, normal respiratory effort. No accessory muscle use.  Cardiovascular: Regular rate and rhythm, no murmurs / rubs / gallops.  3+ extremity edema 2+ pedal pulses. No carotid bruits.  Abdomen: no tenderness, no masses palpated. No hepatosplenomegaly. Bowel sounds positive.  Musculoskeletal: Good range of motion, no joint swelling or  tenderness, Skin: no rashes, lesions, ulcers. No induration Neurologic: CN 2-12 grossly intact. Sensation intact, DTR normal. Strength 5/5 in all 4.  Psychiatric: Normal judgment and insight. Alert and oriented x 3. Normal mood  Data Reviewed:  Glucose 110, BNP 3557, hemoglobin 14.9, D-dimer 2.27, chest x-ray shows cardiomegaly with small right greater than left pleural effusions and bibasilar airspace disease.  EKG showed normal sinus rhythm with frequent PVCs.  Assessment and Plan:  #1 new onset CHF: No prior documented CHF.  Patient had echocardiogram in July at last year with a EF 55 to 60% with grade 1 diastolic dysfunction.  We will admit the patient.  Obtain echocardiogram.  Diuresis aggressively.  Continue oxygenation.  Elevate the feet.  Addendum: CT angiogram of the chest showed significant PE.  It also shows possible right heart strain.  Patient will be initiated on IV heparin.   #2 essential hypertension: Will resume home  regimen  #3 Alzheimer's dementia: Continue with home regimen.  #4 hyperlipidemia: Continue statin  #5 history of squamous cell carcinoma: Stable.  Continue to monitor.  #6 history of pulmonary embolism: Patient not on anticoagulation currently.  We will check patient for possible DVT and PE.  If positive we will treat otherwise monitor.  #7 depression with anxiety: Continue Cymbalta and bupropion.   Advance Care Planning:   Code Status: Prior   Consults: None  Family Communication: Niece who is POA  Severity of Illness: The appropriate patient status for this patient is INPATIENT. Inpatient status is judged to be reasonable and necessary in order to provide the required intensity of service to ensure the patient's safety. The patient's presenting symptoms, physical exam findings, and initial radiographic and laboratory data in the context of their chronic comorbidities is felt to place them at high risk for further clinical deterioration. Furthermore, it is not anticipated that the patient will be medically stable for discharge from the hospital within 2 midnights of admission.   * I certify that at the point of admission it is my clinical judgment that the patient will require inpatient hospital care spanning beyond 2 midnights from the point of admission due to high intensity of service, high risk for further deterioration and high frequency of surveillance required.*  AuthorLonia Blood, MD 02/15/2023 9:06 PM  For on call review [email or web address].

## 2023-02-15 NOTE — ED Provider Notes (Signed)
[facility] Provider Note    Event Date/Time   First MD Initiated Contact with Patient 02/15/23 1906     (approximate)   History   Weight Gain   HPI  Patricia Miller is a 77 y.o. female here with bilateral lower extremity swelling and weight gain.  The patient's had approximately 8 pound weight gain over the last several weeks.  No history of CHF.  She has had increasing lower extremity edema as well as orthopnea and dyspnea with exertion.  No chest pain.  No recent medication changes.     Physical Exam   Triage Vital Signs: ED Triage Vitals  Encounter Vitals Group     BP 02/15/23 1656 114/87     Systolic BP Percentile --      Diastolic BP Percentile --      Pulse Rate 02/15/23 1656 97     Resp 02/15/23 1656 18     Temp 02/15/23 1656 97.9 F (36.6 C)     Temp Source 02/15/23 1656 Oral     SpO2 02/15/23 1656 99 %     Weight 02/15/23 1702 108 lb (49 kg)     Height 02/15/23 1702 5\' 2"  (1.575 m)     Head Circumference --      Peak Flow --      Pain Score 02/15/23 1702 0     Pain Loc --      Pain Education --      Exclude from Growth Chart --     Most recent vital signs: Vitals:   02/15/23 2159 02/15/23 2302  BP: (!) 154/84 126/73  Pulse: 72 70  Resp: 18 18  Temp: (!) 97.5 F (36.4 C) (!) 97.5 F (36.4 C)  SpO2: 100% 97%     General: Awake, no distress.  CV:  Good peripheral perfusion.  Regular rate and rhythm. Resp:  Normal work of breathing.  Bilateral rales noted. Abd:  No distention.  Right hand no tenderness. Other:  2+ pitting edema bilateral lower extremities.   ED Results / Procedures / Treatments   Labs (all labs ordered are listed, but only abnormal results are displayed) Labs Reviewed  BRAIN NATRIURETIC PEPTIDE - Abnormal; Notable for the following components:      Result Value   B Natriuretic Peptide 3,557.9 (*)    All other components within normal limits  CBC - Abnormal; Notable for the following components:    RBC 5.13 (*)    HCT 47.3 (*)    All other components within normal limits  COMPREHENSIVE METABOLIC PANEL - Abnormal; Notable for the following components:   Glucose, Bld 110 (*)    AST 47 (*)    All other components within normal limits  TSH - Abnormal; Notable for the following components:   TSH 5.669 (*)    All other components within normal limits  D-DIMER, QUANTITATIVE - Abnormal; Notable for the following components:   D-Dimer, Quant 2.27 (*)    All other components within normal limits  APTT  PROTIME-INR  BASIC METABOLIC PANEL  HEPARIN LEVEL (UNFRACTIONATED)     EKG Normal sinus rhythm, ventricular rate 92.  PR 146, QRS 76, QTc 492.  No acute ST elevations or depressions.   RADIOLOGY Chest x-ray: Cardiomegaly with bilateral pleural effusions and bibasilar airspace disease.   I also independently reviewed and agree with radiologist interpretations.   PROCEDURES:  Critical Care performed: No   MEDICATIONS ORDERED IN ED: Medications  buPROPion (WELLBUTRIN XL)  24 hr tablet 150 mg (has no administration in time range)  DULoxetine (CYMBALTA) DR capsule 20 mg (has no administration in time range)  divalproex (DEPAKOTE) DR tablet 500 mg (500 mg Oral Not Given 02/16/23 0141)  sodium chloride flush (NS) 0.9 % injection 3 mL (3 mLs Intravenous Given 02/15/23 2354)  sodium chloride flush (NS) 0.9 % injection 3 mL (has no administration in time range)  0.9 %  sodium chloride infusion (has no administration in time range)  acetaminophen (TYLENOL) tablet 650 mg (has no administration in time range)  ondansetron (ZOFRAN) injection 4 mg (has no administration in time range)  furosemide (LASIX) injection 40 mg (has no administration in time range)  heparin ADULT infusion 100 units/mL (25000 units/245mL) (800 Units/hr Intravenous New Bag/Given 02/15/23 2358)  furosemide (LASIX) injection 40 mg (40 mg Intravenous Given 02/15/23 2046)  iohexol (OMNIPAQUE) 350 MG/ML injection 75 mL (75  mLs Intravenous Contrast Given 02/15/23 2115)  heparin bolus via infusion 3,400 Units (3,400 Units Intravenous Bolus from Bag 02/15/23 2358)     IMPRESSION / MDM / ASSESSMENT AND PLAN / ED COURSE  I reviewed the triage vital signs and the nursing notes.                              Differential diagnosis includes, but is not limited to, CHF, renal failure, liver disease, hypoalbuminemia, DVT/PE  Patient's presentation is most consistent with acute presentation with potential threat to life or bodily function.  The patient is on the cardiac monitor to evaluate for evidence of arrhythmia and/or significant heart rate changes  77 yo F with PMHx DVT/PE here with BL leg swelling, SOB with exertion. Clinically, pt appears to have CHF though she has no known h/o this. Ddx includes DVT/PE. D-Dimer positive - will check CT angio. CBC shows no leukocytosis or anemia. CMP with normal renal function and LFTs. She is not hypoxic here. She does, however, have marked b/l edema, BNP >3000. CXR is clear. Will start diuresis, plan to admit. CT pending.    FINAL CLINICAL IMPRESSION(S) / ED DIAGNOSES   Final diagnoses:  Leg edema  Dyspnea, unspecified type  Acute heart failure, unspecified heart failure type (HCC)     Rx / DC Orders   ED Discharge Orders     None        Note:  This document was prepared using Dragon voice recognition software and may include unintentional dictation errors.   Shaune Pollack, MD 02/16/23 [phone]

## 2023-02-15 NOTE — ED Triage Notes (Signed)
Pt presents to the ED POV from [facility] with Niece (HPOA). Niece states that she was sent here due to abnormal EKG and 8lb weight gain in 30 days. Pt has hx of dementia. Is ambulatory and A&Ox2

## 2023-02-15 NOTE — ED Notes (Signed)
US at bedside

## 2023-02-16 ENCOUNTER — Inpatient Hospital Stay
Admit: 2023-02-16 | Discharge: 2023-02-16 | Disposition: A | Discharge status: Home / Self Care | Payer: PPO | Attending: Internal Medicine | Admitting: Internal Medicine

## 2023-02-16 ENCOUNTER — Other Ambulatory Visit: Payer: Self-pay

## 2023-02-16 DIAGNOSIS — C50412 Malignant neoplasm of upper-outer quadrant of left female breast: Secondary | ICD-10-CM | POA: Diagnosis not present

## 2023-02-16 DIAGNOSIS — G309 Alzheimer's disease, unspecified: Secondary | ICD-10-CM | POA: Diagnosis not present

## 2023-02-16 DIAGNOSIS — Z87891 Personal history of nicotine dependence: Secondary | ICD-10-CM | POA: Diagnosis not present

## 2023-02-16 DIAGNOSIS — I2699 Other pulmonary embolism without acute cor pulmonale: Secondary | ICD-10-CM | POA: Diagnosis not present

## 2023-02-16 DIAGNOSIS — I5041 Acute combined systolic (congestive) and diastolic (congestive) heart failure: Secondary | ICD-10-CM | POA: Diagnosis not present

## 2023-02-16 LAB — BASIC METABOLIC PANEL
Anion gap: 11 (ref 5–15)
BUN: 14 mg/dL (ref 8–23)
CO2: 24 mmol/L (ref 22–32)
Calcium: 8.5 mg/dL — ABNORMAL LOW (ref 8.9–10.3)
Chloride: 107 mmol/L (ref 98–111)
Creatinine, Ser: 0.72 mg/dL (ref 0.44–1.00)
GFR, Estimated: >60 mL/min (ref >60)
Glucose, Bld: 82 mg/dL (ref 70–99)
Potassium: 3.4 mmol/L — ABNORMAL LOW (ref 3.5–5.1)
Sodium: 142 mmol/L (ref 135–145)

## 2023-02-16 LAB — HEPARIN LEVEL (UNFRACTIONATED)
Heparin Unfractionated: 0.42 [IU]/mL (ref 0.30–0.70)
Heparin Unfractionated: 0.39 [IU]/mL (ref 0.30–0.70)

## 2023-02-16 MED ORDER — POLYETHYLENE GLYCOL 3350 17 GM/SCOOP PO POWD
17.0000 g | ORAL | Status: DC
  Filled 2023-02-16: qty 255

## 2023-02-16 MED ORDER — POTASSIUM CHLORIDE CRYS ER 20 MEQ PO TBCR
40.0000 meq | EXTENDED_RELEASE_TABLET | Freq: Once | ORAL | Status: AC
  Administered 2023-02-16: 40 meq via ORAL
  Filled 2023-02-16: qty 2

## 2023-02-16 MED ORDER — SERTRALINE HCL 50 MG PO TABS
25.0000 mg | ORAL_TABLET | Freq: Every day | ORAL | Status: DC
  Administered 2023-02-16 – 2023-02-18 (×3): 25 mg via ORAL
  Filled 2023-02-16 (×3): qty 1

## 2023-02-16 MED ORDER — SERTRALINE HCL 50 MG PO TABS
50.0000 mg | ORAL_TABLET | Freq: Every day | ORAL | Status: DC
  Administered 2023-02-16 – 2023-02-18 (×3): 50 mg via ORAL
  Filled 2023-02-16 (×3): qty 1

## 2023-02-16 MED ORDER — CLONAZEPAM 0.5 MG PO TABS
0.5000 mg | ORAL_TABLET | Freq: Two times a day (BID) | ORAL | Status: DC | PRN
  Administered 2023-02-16 – 2023-02-17 (×2): 0.5 mg via ORAL
  Filled 2023-02-16 (×2): qty 1

## 2023-02-16 MED ORDER — OLANZAPINE 5 MG PO TABS
5.0000 mg | ORAL_TABLET | Freq: Every day | ORAL | Status: DC
  Administered 2023-02-16 – 2023-02-17 (×2): 5 mg via ORAL
  Filled 2023-02-16 (×2): qty 1

## 2023-02-16 NOTE — Plan of Care (Signed)

## 2023-02-16 NOTE — Progress Notes (Signed)
PT Cancellation Note  Patient Details Name: Patricia Miller MRN: [id number] DOB: 02-Jul-1945   Cancelled Treatment:    Reason Eval/Treat Not Completed: Medical issues which prohibited therapy (Consult received and chart reviewed.  Patient noted with acute PE.  Will hold therapy evaluation at this time to allow for anticoag for at least 24 hours. Additionally, note plans for thrombectomy on 9/9.  Will continue to follow and initiate as medically appropriate tomorrow or Monday.)  Anya Murphey H. Manson Passey, PT, DPT, NCS 02/16/23, 3:29 PM [phone]

## 2023-02-16 NOTE — Consult Note (Signed)
[facility] CLINIC CARDIOLOGY CONSULT NOTE       Patient ID: Patricia Miller MRN: [id number] DOB/AGE: 79-08-1945 79 y.o.  Admit date: 02/15/2023 Referring Physician Dr Allena Katz Primary Physician Dr Hyacinth Meeker Primary Cardiologist Dr Gwen Pounds (retired) Reason for Consultation AECHF  HPI: Patricia Miller is a 77 y.o. female  with a past medical history of breast cancer s/p BL mastectomy and 16 weeks of chemotherapy, prior PE/DVT, and diastolic heart failure who presented to the ED on 02/15/2023 for shortness of breath and edema. Patient found to have PE and tentatively going for thrombectomy with vascular surgery on Monday 02/18/2023.  This morning, she is feeling better. Her breathing has improved and her edema has significantly gone down. Her POA is present with her. Patient denies chest pain, orthopnea, syncope, diaphoresis, claudication.   Review of systems complete and found to be negative unless listed above     Past Medical History:  Diagnosis Date   Anemia    Anxiety    Arthritis    NECK   AVM (arteriovenous malformation) brain    Breast cancer (HCC)    Cancer (HCC)    BASAL CELL AND MELANOMA   DAVF (dural arteriovenous fistula) 2015   Dementia (HCC)    DVT (deep venous thrombosis) (HCC)    Family history of adverse reaction to anesthesia    niece has to come out of anesthesia slow   HLD (hyperlipidemia)    Hypertension    PE (pulmonary thromboembolism) (HCC)    Pneumonia     Past Surgical History:  Procedure Laterality Date   ABDOMINAL HYSTERECTOMY     AXILLARY SENTINEL NODE BIOPSY Left 07/18/2022   Procedure: AXILLARY SENTINEL NODE BIOPSY;  Surgeon: Carolan Shiver, MD;  Location: [facility] ORS;  Service: General;  Laterality: Left;   BRAIN SURGERY  2015   DURAL AV FISTULA ([place] [facility])   BREAST BIOPSY Left 12/14/2021   [place] Bx 3:00 7 CMFN, Coil Clip, Path Pending   BREAST BIOPSY Left 12/14/2021   [place] Bx 10:00 2 CMFN, Venus Clip, path pending   breast biopsy Left  12/14/2021   [place] Axille, Hydromarker (butterfly). path pending   BREAST BIOPSY Right 12/19/2021   Stereo bx-calcs, "COIL" clip-path pending   BREAST BIOPSY Right 12/19/2021   u/s bx-mass, 3:00, retroareolar, "HEART" clip-path pending   BREAST BIOPSY Left 06/25/2022   [place] LT RADIO FREQUENCY TAG LOC US GUIDE 06/25/2022 [facility]   COLONOSCOPY     COLONOSCOPY WITH PROPOFOL N/A 02/20/2017   Procedure: COLONOSCOPY WITH PROPOFOL;  Surgeon: Scot Jun, MD;  Location: [facility] END[facility];  Service: Endoscopy;  Laterality: N/A;   HYSTEROSCOPY WITH D & C N/A 07/29/2015   Procedure: DILATATION AND CURETTAGE /HYSTEROSCOPY;  Surgeon: Christeen Douglas, MD;  Location: [facility] ORS;  Service: Gynecology;  Laterality: N/A;   HYSTEROSCOPY WITH D & C N/A 01/24/2018   Procedure: DILATATION AND CURETTAGE /HYSTEROSCOPY;  Surgeon: Christeen Douglas, MD;  Location: [facility] ORS;  Service: Gynecology;  Laterality: N/A;   LAPAROSCOPY N/A 01/24/2018   Procedure: LAPAROSCOPY DIAGNOSTIC;  Surgeon: Christeen Douglas, MD;  Location: [facility] ORS;  Service: Gynecology;  Laterality: N/A;   MASTECTOMY W/ SENTINEL NODE BIOPSY Bilateral 07/18/2022   Procedure: MASTECTOMY WITH SENTINEL LYMPH NODE BIOPSY-left axillary RF tag;  Surgeon: Carolan Shiver, MD;  Location: [facility] ORS;  Service: General;  Laterality: Bilateral;   PERIPHERAL VASCULAR THROMBECTOMY N/A 02/29/2020   Procedure: PERIPHERAL VASCULAR THROMBECTOMY / THROMBOLYSIS WITH POSSIBLE PULMONARY THROMBECTOMY / THROMBOLYSIS;  Surgeon: Annice Needy, MD;  Location:  [facility] INVASIVE CV LAB;  Service: Cardiovascular;  Laterality: N/A;   PORTACATH PLACEMENT N/A 01/08/2022   Procedure: INSERTION PORT-A-CATH;  Surgeon: Carolan Shiver, MD;  Location: [facility] ORS;  Service: General;  Laterality: N/A;   TONSILLECTOMY     AGE 77   TOTAL LAPAROSCOPIC HYSTERECTOMY WITH BILATERAL SALPINGO OOPHORECTOMY Bilateral 12/28/2019   Procedure: TOTAL LAPAROSCOPIC HYSTERECTOMY WITH BILATERAL SALPINGO  OOPHORECTOMY;  Surgeon: Christeen Douglas, MD;  Location: [facility] ORS;  Service: Gynecology;  Laterality: Bilateral;   TUBAL LIGATION      Medications Prior to Admission  Medication Sig Dispense Refill Last Dose   clonazePAM (KLONOPIN) 0.5 MG tablet Take 1 tablet by mouth every 12 (twelve) hours as needed (for agitation).   PRN at PRN   [facility] 17 GM/SCOOP powder Take 17 g by mouth every Monday, Wednesday, and Friday.   02/15/2023 at 0800   OLANZapine (ZYPREXA) 5 MG tablet Take 5 mg by mouth at bedtime.   02/14/2023 at 2000   sertraline (ZOLOFT) 25 MG tablet Take 25 mg by mouth daily. In addition to 50 mg for a total daily dose of 75 mg   02/15/2023 at 0800   sertraline (ZOLOFT) 50 MG tablet Take 50 mg by mouth daily.   02/15/2023 at 0800   buPROPion (WELLBUTRIN XL) 150 MG 24 hr tablet Take 150 mg by mouth every morning. (Patient not taking: Reported on 02/16/2023)  6 Not Taking   divalproex (DEPAKOTE) 500 MG DR tablet Take 500 mg by mouth 2 (two) times daily. (Patient not taking: Reported on 02/16/2023)   Not Taking   DULoxetine (CYMBALTA) 20 MG capsule Take 1 capsule (20 mg total) by mouth daily. (Patient not taking: Reported on 02/16/2023) 30 capsule 3 Not Taking   Social History   Socioeconomic History   Marital status: Divorced    Spouse name: Not on file   Number of children: Not on file   Years of education: Not on file   Highest education level: Not on file  Occupational History   Not on file  Tobacco Use   Smoking status: Former    Current packs/day: 0.00    Average packs/day: 0.5 packs/day for 15.0 years (7.5 ttl pk-yrs)    Types: Cigarettes    Start date: 07/21/1985    Quit date: 07/21/2000    Years since quitting: 22.5   Smokeless tobacco: Never  Vaping Use   Vaping status: Never Used  Substance and Sexual Activity   Alcohol use: Not Currently   Drug use: No   Sexual activity: Not Currently  Other Topics Concern   Not on file  Social History Narrative   Lives alone, caregiver 79  year old daughter   Social Determinants of Health   Financial Resource Strain: Not on file  Food Insecurity: No Food Insecurity (02/16/2023)   Hunger Vital Sign    Worried About Running Out of Food in the Last Year: Never true    Ran Out of Food in the Last Year: Never true  Transportation Needs: No Transportation Needs (02/16/2023)   PRAPARE - Administrator, Civil Service (Medical): No    Lack of Transportation (Non-Medical): No  Physical Activity: Not on file  Stress: Not on file  Social Connections: Not on file  Intimate Partner Violence: Patient Declined (02/16/2023)   Humiliation, Afraid, Rape, and Kick questionnaire    Fear of Current or Ex-Partner: Patient declined    Emotionally Abused: Patient declined    Physically Abused: Patient declined  Sexually Abused: Patient declined    Family History  Problem Relation Age of Onset   Stomach cancer Sister    Hypertension Sister    Hypertension Sister    Diabetes Niece    Hypertension Niece    Breast cancer Neg Hx      Vitals:   02/15/23 2302 02/16/23 0417 02/16/23 0500 02/16/23 0826  BP: 126/73 (!) 113/57  110/71  Pulse: 70 67  70  Resp: 18   16  Temp: (!) 97.5 F (36.4 C) 97.8 F (36.6 C)  98 F (36.7 C)  TempSrc: Oral Oral    SpO2: 97% 95%  90%  Weight:   51 kg   Height:        PHYSICAL EXAM General: alert, well nourished, in no acute distress. HEENT: Normocephalic and atraumatic. Neck: No JVD.  Lungs: Normal respiratory effort. Clear bilaterally to auscultation. No wheezes, crackles, rhonchi.  Heart: HRRR. Normal S1 and S2 without gallops or murmurs.  Abdomen: Non-distended appearing.  Msk: Normal strength and tone for age. Extremities: Warm and well perfused. No clubbing, cyanosis. trace edema.  Neuro: Alert and oriented X 3. Psych: Answers questions appropriately.   Labs: Basic Metabolic Panel: Recent Labs    02/15/23 1708 02/16/23 0543  NA 142 142  K 3.8 3.4*  CL 107 107  CO2 25 24   GLUCOSE 110* 82  BUN 17 14  CREATININE 0.86 0.72  CALCIUM 9.2 8.5*   Liver Function Tests: Recent Labs    02/15/23 1708  AST 47*  ALT 37  ALKPHOS 88  BILITOT 1.0  PROT 6.8  ALBUMIN 3.8   No results for input(s): "LIPASE", "AMYLASE" in the last 72 hours. CBC: Recent Labs    02/15/23 1708  WBC 5.5  HGB 14.9  HCT 47.3*  MCV 92.2  PLT 202   Cardiac Enzymes: No results for input(s): "CKTOTAL", "CKMB", "CKMBINDEX", "TROPONINIHS" in the last 72 hours. BNP: Recent Labs    02/15/23 1708  BNP 3,557.9*   D-Dimer: Recent Labs    02/15/23 2000  DDIMER 2.27*   Hemoglobin A1C: No results for input(s): "HGBA1C" in the last 72 hours. Fasting Lipid Panel: No results for input(s): "CHOL", "HDL", "LDLCALC", "TRIG", "CHOLHDL", "LDLDIRECT" in the last 72 hours. Thyroid Function Tests: Recent Labs    02/15/23 2000  TSH 5.669*   Anemia Panel: No results for input(s): "VITAMINB12", "FOLATE", "FERRITIN", "TIBC", "IRON", "RETICCTPCT" in the last 72 hours.   Radiology: US Venous Img Lower Bilateral  Result Date: 02/15/2023 CLINICAL DATA:  Bilateral lower extremity swelling EXAM: BILATERAL LOWER EXTREMITY VENOUS DOPPLER ULTRASOUND TECHNIQUE: Gray-scale sonography with compression, as well as color and duplex ultrasound, were performed to evaluate the deep venous system(s) from the level of the common femoral vein through the popliteal and proximal calf veins. COMPARISON:  None Available. FINDINGS: VENOUS Normal compressibility of the common femoral, superficial femoral, and popliteal veins, as well as the visualized calf veins. Visualized portions of profunda femoral vein and great saphenous vein unremarkable. No filling defects to suggest DVT on grayscale or color Doppler imaging. Doppler waveforms show normal direction of venous flow, normal respiratory plasticity and response to augmentation. OTHER None. Limitations: none IMPRESSION: Negative. Electronically Signed   By: Helyn Numbers  M.D.   On: 02/15/2023 22:26   CT Angio Chest PE W and/or Wo Contrast  Result Date: 02/15/2023 CLINICAL DATA:  Abnormal EKG and 8 pound weight gain in 30 days. EXAM: CT ANGIOGRAPHY CHEST WITH CONTRAST TECHNIQUE: Multidetector CT imaging  of the chest was performed using the standard protocol during bolus administration of intravenous contrast. Multiplanar CT image reconstructions and MIPs were obtained to evaluate the vascular anatomy. RADIATION DOSE REDUCTION: This exam was performed according to the departmental dose-optimization program which includes automated exposure control, adjustment of the mA and/or kV according to patient size and/or use of iterative reconstruction technique. CONTRAST:  75mL OMNIPAQUE IOHEXOL 350 MG/ML SOLN COMPARISON:  Oct 20, 2020 FINDINGS: Cardiovascular: Marked severity intraluminal low attenuation is seen within the posterior aspect of the proximal, mid and distal portions of the right pulmonary artery. There is moderate severity cardiomegaly with mild right heart strain (RV/LV ratio of 1.13). A small pericardial effusion is noted. Mediastinum/Nodes: No enlarged mediastinal, hilar, or axillary lymph nodes. Thyroid gland, trachea, and esophagus demonstrate no significant findings. Lungs/Pleura: Mild areas of scarring and/or atelectasis are present within the bilateral apices, right middle lobe and bilateral lung bases. A moderate to large, partially loculated right pleural effusion is seen. No pneumothorax is identified. Upper Abdomen: No acute abnormality. Musculoskeletal: No chest wall abnormality. No acute or significant osseous findings. Review of the MIP images confirms the above findings. IMPRESSION: 1. Marked severity right-sided pulmonary embolism with associated mild right heart strain. 2. Moderate to large, partially loculated right pleural effusion. 3. Small pericardial effusion. Electronically Signed   By: Aram Candela M.D.   On: 02/15/2023 22:13   DG Chest  Portable 1 View  Result Date: 02/15/2023 CLINICAL DATA:  Abnormal EKG, 8 pound weight gain EXAM: PORTABLE CHEST 1 VIEW COMPARISON:  01/08/2022 FINDINGS: Removal of right-sided central venous port. Cardiomegaly with small right greater than left pleural effusions. Airspace disease at the bases. Aortic atherosclerosis. No pneumothorax. IMPRESSION: Cardiomegaly with small right greater than left pleural effusions and bibasilar airspace disease, favor atelectasis. Electronically Signed   By: Jasmine Pang M.D.   On: 02/15/2023 18:59    ECHO pending  TELEMETRY reviewed by me  02/16/2023 : NSR with PVCs  EKG reviewed by me: NSR with PVCs  Data reviewed by me 02/16/2023: last 24h vitals tele labs imaging I/O primary notes  Principal Problem:   Acute combined systolic and diastolic CHF, NYHA class 1 (HCC) Active Problems:   History of squamous cell carcinoma   HTN (hypertension)   Hyperlipidemia, mixed   Alzheimer's dementia without behavioral disturbance (HCC)   Malignant neoplasm of upper-outer quadrant of left breast in female, estrogen receptor negative (HCC)   History of pulmonary embolism (HCC)    ASSESSMENT AND PLAN:   Right-sided PE with associated mild right heart strain and small pericardial effusion. Suspect provoked by malignancy, in addition to prior history of DVT/PE Patient tentatively going for thrombectomy with vascular surgery on Monday 02/18/2023. Continue heparin gtt. Minimal right heart strain per CTA.   HFpEF 55-60 with grade I DD% Repeat echocardiogram pending. Continue Lasix 40 mg IV BID. Can likely switch to PO Lasix on Monday.   Hypertension BP very well controlled at this time   Alzheimer's dementia Continue with home regimen.    Hyperlipidemia Continue statin    Signed: Clotilde Dieter, DO  02/16/2023, 12:15 PM [facility] Cardiology

## 2023-02-16 NOTE — Progress Notes (Signed)
ANTICOAGULATION CONSULT NOTE  Pharmacy Consult for heparin infusion Indication: VTE Tx / PE  No Known Allergies  Patient Measurements: Height: 5\' 2"  (157.5 cm) Weight: 51 kg (112 lb 7 oz) IBW/kg (Calculated) : 50.1 Heparin Dosing Weight: 49 kg  Vital Signs: Temp: 98 F (36.7 C) (09/07 0826) Temp Source: Oral (09/07 0417) BP: 110/71 (09/07 0826) Pulse Rate: 70 (09/07 0826)  Labs: Recent Labs    02/15/23 1708 02/15/23 2327 02/16/23 0543 02/16/23 0746  HGB 14.9  --   --   --   HCT 47.3*  --   --   --   PLT 202  --   --   --   APTT  --  29  --   --   LABPROT  --  14.5  --   --   INR  --  1.1  --   --   HEPARINUNFRC  --   --   --  0.42  CREATININE 0.86  --  0.72  --     Estimated Creatinine Clearance: 45.1 mL/min (by C-G formula based on SCr of 0.72 mg/dL).   Medical History: Past Medical History:  Diagnosis Date   Anemia    Anxiety    Arthritis    NECK   AVM (arteriovenous malformation) brain    Breast cancer (HCC)    Cancer (HCC)    BASAL CELL AND MELANOMA   DAVF (dural arteriovenous fistula) 2015   Dementia (HCC)    DVT (deep venous thrombosis) (HCC)    Family history of adverse reaction to anesthesia    niece has to come out of anesthesia slow   HLD (hyperlipidemia)    Hypertension    PE (pulmonary thromboembolism) (HCC)    Pneumonia     Assessment: Pt is a 77 yo female presenting [facility] ED from [facility] d/t abnormal EKG and 8lb weight gain in 30 days, found with "marked severity right-sided pulmonary embolism with associated mild right heart strain."  9/7 0746 HL 0.42   Goal of Therapy:  Heparin level 0.3-0.7 units/ml Monitor platelets by anticoagulation protocol: Yes   Plan:  Heparin level is therapeutic. Will continue heparin infusion at 800 units/hr. Recheck heparin level in 8 hours. CBC daily while on heparin.   Paschal Dopp, PharmD, BCPS 02/16/2023 10:33 AM

## 2023-02-16 NOTE — Progress Notes (Signed)
[facility]  - [place] at [facility]   PATIENT NAME: Patricia Miller    MR#:  [id number]  DATE OF BIRTH:  1945-10-22  SUBJECTIVE:  Nice in the room. Patient came in with increasing shortness of breath bilateral lower extremity swelling and weight gain poor knees. Workup showed PE and congestive heart failure new onset.    VITALS:  Blood pressure 121/71, pulse 74, temperature 97.8 F (36.6 C), resp. rate 19, height 5\' 2"  (1.575 m), weight 51 kg, SpO2 94%.  PHYSICAL EXAMINATION:   GENERAL:  77 y.o.-year-old patient with no acute distress.  LUNGS: decreased breath sounds bilaterally, no wheezing. bibasilar rales present CARDIOVASCULAR: S1, S2 normal. No murmur   ABDOMEN: Soft, nontender, nondistended. Bowel sounds present.  EXTREMITIES: ++ edema b/l.    NEUROLOGIC: nonfocal  patient is alert and awake  LABORATORY PANEL:  CBC Recent Labs  Lab 02/15/23 1708  WBC 5.5  HGB 14.9  HCT 47.3*  PLT 202    Chemistries  Recent Labs  Lab 02/15/23 1708 02/16/23 0543  NA 142 142  K 3.8 3.4*  CL 107 107  CO2 25 24  GLUCOSE 110* 82  BUN 17 14  CREATININE 0.86 0.72  CALCIUM 9.2 8.5*  AST 47*  --   ALT 37  --   ALKPHOS 88  --   BILITOT 1.0  --    Cardiac Enzymes No results for input(s): "TROPONINI" in the last 168 hours. RADIOLOGY:  US Venous Img Lower Bilateral  Result Date: 02/15/2023 CLINICAL DATA:  Bilateral lower extremity swelling EXAM: BILATERAL LOWER EXTREMITY VENOUS DOPPLER ULTRASOUND TECHNIQUE: Gray-scale sonography with compression, as well as color and duplex ultrasound, were performed to evaluate the deep venous system(s) from the level of the common femoral vein through the popliteal and proximal calf veins. COMPARISON:  None Available. FINDINGS: VENOUS Normal compressibility of the common femoral, superficial femoral, and popliteal veins, as well as the visualized calf veins. Visualized portions of profunda femoral vein and great saphenous vein  unremarkable. No filling defects to suggest DVT on grayscale or color Doppler imaging. Doppler waveforms show normal direction of venous flow, normal respiratory plasticity and response to augmentation. OTHER None. Limitations: none IMPRESSION: Negative. Electronically Signed   By: Helyn Numbers M.D.   On: 02/15/2023 22:26   CT Angio Chest PE W and/or Wo Contrast  Result Date: 02/15/2023 CLINICAL DATA:  Abnormal EKG and 8 pound weight gain in 30 days. EXAM: CT ANGIOGRAPHY CHEST WITH CONTRAST TECHNIQUE: Multidetector CT imaging of the chest was performed using the standard protocol during bolus administration of intravenous contrast. Multiplanar CT image reconstructions and MIPs were obtained to evaluate the vascular anatomy. RADIATION DOSE REDUCTION: This exam was performed according to the departmental dose-optimization program which includes automated exposure control, adjustment of the mA and/or kV according to patient size and/or use of iterative reconstruction technique. CONTRAST:  75mL OMNIPAQUE IOHEXOL 350 MG/ML SOLN COMPARISON:  Oct 20, 2020 FINDINGS: Cardiovascular: Marked severity intraluminal low attenuation is seen within the posterior aspect of the proximal, mid and distal portions of the right pulmonary artery. There is moderate severity cardiomegaly with mild right heart strain (RV/LV ratio of 1.13). A small pericardial effusion is noted. Mediastinum/Nodes: No enlarged mediastinal, hilar, or axillary lymph nodes. Thyroid gland, trachea, and esophagus demonstrate no significant findings. Lungs/Pleura: Mild areas of scarring and/or atelectasis are present within the bilateral apices, right middle lobe and bilateral lung bases. A moderate to large, partially loculated right pleural effusion is seen. No  pneumothorax is identified. Upper Abdomen: No acute abnormality. Musculoskeletal: No chest wall abnormality. No acute or significant osseous findings. Review of the MIP images confirms the above  findings. IMPRESSION: 1. Marked severity right-sided pulmonary embolism with associated mild right heart strain. 2. Moderate to large, partially loculated right pleural effusion. 3. Small pericardial effusion. Electronically Signed   By: Aram Candela M.D.   On: 02/15/2023 22:13   DG Chest Portable 1 View  Result Date: 02/15/2023 CLINICAL DATA:  Abnormal EKG, 8 pound weight gain EXAM: PORTABLE CHEST 1 VIEW COMPARISON:  01/08/2022 FINDINGS: Removal of right-sided central venous port. Cardiomegaly with small right greater than left pleural effusions. Airspace disease at the bases. Aortic atherosclerosis. No pneumothorax. IMPRESSION: Cardiomegaly with small right greater than left pleural effusions and bibasilar airspace disease, favor atelectasis. Electronically Signed   By: Jasmine Pang M.D.   On: 02/15/2023 18:59    Assessment and Plan  Patricia Miller is a 77 y.o. 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  Patient was seen in the ER.  Has some shortness of breath but not hypoxic.  Has bilateral lower extremity edema that is persistent and heavy.  Patient with no prior history of CHF.  Congestive heart failure acute suspect diastolic in the setting of ?chronic  pulmonary embolism with right heart strain/ Acute Cor Pulmonale -- patient was started on IV heparin drip -- patient has history of DVT and PE in the past-- per knees she finishes six month course of Xarelto and then was asked to discontinue -- no history of bleeding. Patient will be on lifelong anticoagulation unless side effects happen -- vascular consultation with Dr. 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 Not a candidate for thrombectomy at present. Recommends switch to PO eliquis -- cardiology consultation with Dr. Melton Alar for new onset CHF -- started on IV Lasix  Pulmonary embolism and history of DVT -- as above  Hypertension -- patient not on any blood pressure meds at home. BP stable  Depression with anxiety history of dementia -- continue Zyprexa, Zoloft  History of breast cancer -- not on any treatment at present-- follows with Dr. Smith Robert -- she underwent neoadjuvant chemotherapy followed by bilateral mastectomy      Procedures: Family communication : Niece bedside Consults : vascular, cardiology CODE STATUS: DNR DVT Prophylaxis : heparin drip Level of care: Telemetry Cardiac Status is: Inpatient Remains inpatient appropriate because: CHF, PE    TOTAL TIME TAKING CARE OF THIS PATIENT: 35 minutes.  >50% time spent on counselling and coordination of care  Note: This dictation was prepared with Dragon dictation along with smaller phrase technology. Any transcriptional errors that result from this process are unintentional.  Enedina Finner M.D    [facility]   CC: Primary care physician; Danella Penton, MD

## 2023-02-16 NOTE — Progress Notes (Signed)
OT Cancellation Note  Patient Details Name: Patricia Miller MRN: [id number] DOB: 17-Oct-1945   Cancelled Treatment:    Reason Eval/Treat Not Completed: Patient not medically ready. Consult received, chart reviewed. Pt + for PE, heparin drip initiated. Per therapy protocols, therapy to initiate after 24hrs since initiation of heparin. Will hold and re-attempt next date as medically appropriate. Note plan for thrombectomy on Mon 02/18/23.   Arman Filter., MPH, MS, OTR/L ascom [phone] 02/16/23, 3:26 PM

## 2023-02-16 NOTE — Consult Note (Signed)
[facility]  Patient name: Patricia Miller MRN: [id number] DOB: Jun 21, 1945 Sex: female   REQUESTING PROVIDER:   Dr. Allena Katz   REASON FOR CONSULT:    PE  HISTORY OF PRESENT ILLNESS:   Patricia Miller is a 77 y.o. female, who presented to the emergency department yesterday with chief complaint of weight gain.  She was brought in from [facility] memory care center.  She reports an 8 pound weight gain within the last 30 days.  She was complaining of some shortness of breath as well as bilateral lower extremity swelling.  Her D-dimer was elevated at 2.27 and so she went for a CT scan that did show a pulmonary embolism as well as right heart strain.  In 2021, she was diagnosed with an extensive left leg DVT.  She also had significant right-sided pulmonary embolism.  On 02/29/2020, she underwent mechanical thrombectomy of the right main, middle, and lower lobe pulmonary arteries.  She also had a IVC filter placed as well as mechanical thrombectomy to the left common femoral and superficial femoral veins.  She was seen in the office on 03/22/2020 and was doing well without significant leg swelling.  She was having some side effects from Eliquis and so she was switched to Xarelto at the patient's request.  When we saw her in May 2022, the patient had stopped her Xarelto.  She was doing very well.  We have not seen her since.  Currently, she denies any chest pain.  She is not on oxygen.  She does not complain of shortness of breath.  The patient now has new onset congestive heart failure.  She is medically managed for hypertension.  She is on a statin for hypercholesterolemia.  PAST MEDICAL HISTORY    Past Medical History:  Diagnosis Date   Anemia    Anxiety    Arthritis    NECK   AVM (arteriovenous malformation) brain    Breast cancer (HCC)    Cancer (HCC)    BASAL CELL AND MELANOMA   DAVF (dural arteriovenous fistula)  2015   Dementia (HCC)    DVT (deep venous thrombosis) (HCC)    Family history of adverse reaction to anesthesia    niece has to come out of anesthesia slow   HLD (hyperlipidemia)    Hypertension    PE (pulmonary thromboembolism) (HCC)    Pneumonia      FAMILY HISTORY   Family History  Problem Relation Age of Onset   Stomach cancer Sister    Hypertension Sister    Hypertension Sister    Diabetes Niece    Hypertension Niece    Breast cancer Neg Hx     SOCIAL HISTORY:   Social History   Socioeconomic History   Marital status: Divorced    Spouse name: Not on file   Number of children: Not on file   Years of education: Not on file   Highest education level: Not on file  Occupational History   Not on file  Tobacco Use   Smoking status: Former    Current packs/day: 0.00    Average packs/day: 0.5 packs/day for 15.0 years (7.5 ttl pk-yrs)    Types: Cigarettes    Start date: 07/21/1985    Quit date: 07/21/2000    Years since quitting: 22.5   Smokeless tobacco: Never  Vaping Use   Vaping status: Never Used  Substance and Sexual Activity   Alcohol use: Not Currently   Drug  use: No   Sexual activity: Not Currently  Other Topics Concern   Not on file  Social History Narrative   Lives alone, caregiver 67 year old daughter   Social Determinants of Health   Financial Resource Strain: Not on file  Food Insecurity: No Food Insecurity (02/16/2023)   Hunger Vital Sign    Worried About Running Out of Food in the Last Year: Never true    Ran Out of Food in the Last Year: Never true  Transportation Needs: No Transportation Needs (02/16/2023)   PRAPARE - Administrator, Civil Service (Medical): No    Lack of Transportation (Non-Medical): No  Physical Activity: Not on file  Stress: Not on file  Social Connections: Not on file  Intimate Partner Violence: Patient Declined (02/16/2023)   Humiliation, Afraid, Rape, and Kick questionnaire    Fear of Current or Ex-Partner:  Patient declined    Emotionally Abused: Patient declined    Physically Abused: Patient declined    Sexually Abused: Patient declined    ALLERGIES:    No Known Allergies  CURRENT MEDICATIONS:    Current Facility-Administered Medications  Medication Dose Route Frequency Provider Last Rate Last Admin   0.9 %  sodium chloride infusion  250 mL Intravenous PRN Rometta Emery, MD       acetaminophen (TYLENOL) tablet 650 mg  650 mg Oral Q4H PRN Rometta Emery, MD       clonazePAM (KLONOPIN) tablet 0.5 mg  0.5 mg Oral Q12H PRN Enedina Finner, MD       furosemide (LASIX) injection 40 mg  40 mg Intravenous BID Earlie Lou L, MD   40 mg at 02/16/23 0914   heparin ADULT infusion 100 units/mL (25000 units/259mL)  800 Units/hr Intravenous Continuous Otelia Sergeant, RPH 8 mL/hr at 02/15/23 2358 800 Units/hr at 02/15/23 2358   OLANZapine (ZYPREXA) tablet 5 mg  5 mg Oral QHS Enedina Finner, MD       ondansetron [facility]) injection 4 mg  4 mg Intravenous Q6H PRN Rometta Emery, MD       [START ON 02/18/2023] polyethylene glycol powder (GLYCOLAX/MIRALAX) container 17 g  17 g Oral Q M,W,F Enedina Finner, MD       sertraline (ZOLOFT) tablet 25 mg  25 mg Oral Daily Enedina Finner, MD   25 mg at 02/16/23 1300   sertraline (ZOLOFT) tablet 50 mg  50 mg Oral Daily Enedina Finner, MD   50 mg at 02/16/23 1300   sodium chloride flush (NS) 0.9 % injection 3 mL  3 mL Intravenous Q12H Earlie Lou L, MD   3 mL at 02/16/23 1106   sodium chloride flush (NS) 0.9 % injection 3 mL  3 mL Intravenous PRN Rometta Emery, MD        REVIEW OF SYSTEMS:   [X]  denotes positive finding, [ ]  denotes negative finding Cardiac  Comments:  Chest pain or chest pressure:    Shortness of breath upon exertion:    Short of breath when lying flat:    Irregular heart rhythm:        Vascular    Pain in calf, thigh, or hip brought on by ambulation:    Pain in feet at night that wakes you up from your sleep:     Blood clot in your  veins:    Leg swelling:  x       Pulmonary    Oxygen at home:    Productive cough:  Wheezing:         Neurologic    Sudden weakness in arms or legs:     Sudden numbness in arms or legs:     Sudden onset of difficulty speaking or slurred speech:    Temporary loss of vision in one eye:     Problems with dizziness:         Gastrointestinal    Blood in stool:      Vomited blood:         Genitourinary    Burning when urinating:     Blood in urine:        Psychiatric    Major depression:         Hematologic    Bleeding problems:    Problems with blood clotting too easily:        Skin    Rashes or ulcers:        Constitutional    Fever or chills:     PHYSICAL EXAM:   Vitals:   02/16/23 0500 02/16/23 0826 02/16/23 1302 02/16/23 1633  BP:  110/71 121/71 122/73  Pulse:  70 74 (!) 35  Resp:  16 19 17   Temp:  98 F (36.7 C) 97.8 F (36.6 C) 98.3 F (36.8 C)  TempSrc:      SpO2:  90% 94% 97%  Weight: 51 kg     Height:        GENERAL: The patient is a well-nourished female, in no acute distress. The vital signs are documented above. CARDIAC: There is a regular rate and rhythm.  VASCULAR: Bilateral edema PULMONARY: Nonlabored respirations ABDOMEN: Soft and non-tender MUSCULOSKELETAL: There are no major deformities or cyanosis. NEUROLOGIC: No focal weakness or paresthesias are detected. SKIN: There are no ulcers or rashes noted. PSYCHIATRIC: The patient has a normal affect.  STUDIES:   I have reviewed the following studies: Venous ultrasound: Negative for DVT  CT angiogram: IMPRESSION: 1. Marked severity right-sided pulmonary embolism with associated mild right heart strain. 2. Moderate to large, partially loculated right pleural effusion. 3. Small pericardial effusion. ADDENDUM: I have discussed this case with Dr. Myra Gianotti of vascular surgery. In review of the prior examination of 10/20/2020, the posterior and lateral segmental pulmonary arteries of  the right lower lobe are chronically occluded, as is the anteromedial segmental pulmonary artery of the left lower lobe. In the interval, there has developed irregular, eccentric mural thrombus within the inter lobar right pulmonary artery posteriorly, most in keeping with chronic mural thrombus given its eccentric configuration. There is no definite acute pulmonary embolus identified on this examination. While there is evidence of cardiogenic failure with progressive cardiomegaly, reflux of contrast into the hepatic venous system, development of small pericardial and bilateral pleural effusions, right greater than left, there is no interventricular septal deviation or inversion of the RV/LV ratio to suggest acute right heart strain. ASSESSMENT and PLAN   PE: I have reviewed the CT scan with radiology.  Upon extensive review, we feel that this pulmonary embolism is chronic in nature when compared to a scan from 2 years ago.  It is unclear what her initial etiology for DVT and PE were from 2021.  She has subsequently stopped her coagulation.  At this time, I would recommend continuing her anticoagulation if there are no contraindications.  No vascular intervention is recommended at this time.  Acute congestive heart failure: Echocardiogram is pending.  Cardiology is involved with her care.   Charlena Cross, MD, FACS [facility] [phone] Pager [phone]

## 2023-02-16 NOTE — Plan of Care (Signed)
  Problem: Education: Goal: Knowledge of General Education information will improve Description Including pain rating scale, medication(s)/side effects and non-pharmacologic comfort measures Outcome: Progressing   

## 2023-02-16 NOTE — Progress Notes (Signed)
ANTICOAGULATION CONSULT NOTE  Pharmacy Consult for heparin infusion Indication: VTE Tx / PE  No Known Allergies  Patient Measurements: Height: 5\' 2"  (157.5 cm) Weight: 51 kg (112 lb 7 oz) IBW/kg (Calculated) : 50.1 Heparin Dosing Weight: 49 kg  Vital Signs: Temp: 97.8 F (36.6 C) (09/07 1302) Temp Source: Oral (09/07 0417) BP: 121/71 (09/07 1302) Pulse Rate: 74 (09/07 1302)  Labs: Recent Labs    02/15/23 1708 02/15/23 2327 02/16/23 0543 02/16/23 0746 02/16/23 1545  HGB 14.9  --   --   --   --   HCT 47.3*  --   --   --   --   PLT 202  --   --   --   --   APTT  --  29  --   --   --   LABPROT  --  14.5  --   --   --   INR  --  1.1  --   --   --   HEPARINUNFRC  --   --   --  0.42 0.39  CREATININE 0.86  --  0.72  --   --     Estimated Creatinine Clearance: 45.1 mL/min (by C-G formula based on SCr of 0.72 mg/dL).   Medical History: Past Medical History:  Diagnosis Date   Anemia    Anxiety    Arthritis    NECK   AVM (arteriovenous malformation) brain    Breast cancer (HCC)    Cancer (HCC)    BASAL CELL AND MELANOMA   DAVF (dural arteriovenous fistula) 2015   Dementia (HCC)    DVT (deep venous thrombosis) (HCC)    Family history of adverse reaction to anesthesia    niece has to come out of anesthesia slow   HLD (hyperlipidemia)    Hypertension    PE (pulmonary thromboembolism) (HCC)    Pneumonia     Assessment: Pt is a 77 yo female presenting [facility] ED from SNF d/t abnormal EKG and 8lb weight gain in 30 days, found with "marked severity right-sided pulmonary embolism with associated mild right heart strain."  9/7 0746 HL 0.42  9/7 1545 HL 0.39  Goal of Therapy:  Heparin level 0.3-0.7 units/ml Monitor platelets by anticoagulation protocol: Yes   Plan: Heparin level is therapeutic x 1.  Will continue heparin infusion at 800 units/hr.  Transition to daily heparin levels. Recheck heparin level tomorrow AM.  CBC daily while on heparin.    Elliot Gurney, PharmD, BCPS Clinical Pharmacist  02/16/2023 4:13 PM

## 2023-02-17 DIAGNOSIS — Z171 Estrogen receptor negative status [ER-]: Secondary | ICD-10-CM

## 2023-02-17 DIAGNOSIS — I2699 Other pulmonary embolism without acute cor pulmonale: Secondary | ICD-10-CM

## 2023-02-17 DIAGNOSIS — F0280 Dementia in other diseases classified elsewhere without behavioral disturbance: Secondary | ICD-10-CM

## 2023-02-17 DIAGNOSIS — G309 Alzheimer's disease, unspecified: Secondary | ICD-10-CM

## 2023-02-17 DIAGNOSIS — C50412 Malignant neoplasm of upper-outer quadrant of left female breast: Secondary | ICD-10-CM

## 2023-02-17 DIAGNOSIS — I5041 Acute combined systolic (congestive) and diastolic (congestive) heart failure: Secondary | ICD-10-CM | POA: Diagnosis not present

## 2023-02-17 LAB — CBC
HCT: 43.2 % (ref 36.0–46.0)
Hemoglobin: 13.9 g/dL (ref 12.0–15.0)
MCH: 28.6 pg (ref 26.0–34.0)
MCHC: 32.2 g/dL (ref 30.0–36.0)
MCV: 88.9 fL (ref 80.0–100.0)
Platelets: 189 10*3/uL (ref 150–400)
RBC: 4.86 MIL/uL (ref 3.87–5.11)
RDW: 14.3 % (ref 11.5–15.5)
WBC: 5.7 10*3/uL (ref 4.0–10.5)
nRBC: 0 % (ref 0.0–0.2)

## 2023-02-17 LAB — HEPARIN LEVEL (UNFRACTIONATED): Heparin Unfractionated: 0.38 [IU]/mL (ref 0.30–0.70)

## 2023-02-17 MED ORDER — FUROSEMIDE 10 MG/ML IJ SOLN
INTRAMUSCULAR | Status: AC
  Filled 2023-02-17: qty 4

## 2023-02-17 MED ORDER — RIVAROXABAN 20 MG PO TABS: 20.0000 mg | ORAL_TABLET | Freq: Every day | ORAL | Status: DC

## 2023-02-17 MED ORDER — FUROSEMIDE 20 MG PO TABS
20.0000 mg | ORAL_TABLET | Freq: Every day | ORAL | Status: DC
  Administered 2023-02-18: 20 mg via ORAL
  Filled 2023-02-17: qty 1

## 2023-02-17 MED ORDER — RIVAROXABAN 15 MG PO TABS
15.0000 mg | ORAL_TABLET | Freq: Two times a day (BID) | ORAL | Status: DC
  Administered 2023-02-17 – 2023-02-18 (×2): 15 mg via ORAL
  Filled 2023-02-17 (×4): qty 1

## 2023-02-17 MED ORDER — FUROSEMIDE 10 MG/ML IJ SOLN: 40.0000 mg | Freq: Every day | INTRAMUSCULAR | Status: DC

## 2023-02-17 NOTE — Plan of Care (Signed)

## 2023-02-17 NOTE — Progress Notes (Signed)
[facility] CLINIC CARDIOLOGY PROGRESS NOTE   Patient ID: Patricia Miller MRN: [id number] DOB/AGE: 17-Apr-1946 77 y.o.  Admit date: 02/15/2023 Referring Physician Dr Allena Katz Primary Physician Dr Hyacinth Meeker Primary Cardiologist Dr Gwen Pounds (retired) Reason for Consultation AECHF  HPI: Patricia Miller is a 77 y.o. female  with a past medical history of breast cancer s/p BL mastectomy and 16 weeks of chemotherapy, prior PE/DVT, and diastolic heart failure who presented to the ED on 02/15/2023 for shortness of breath and edema. Patient found to have PE and tentatively going for thrombectomy with vascular surgery on Monday 02/18/2023.   Interval History:  -patient seen and examined at bedside, resting comfortably. No events overnight. Breathing continues to improve. Edema has pretty much resolved.  Review of systems complete and found to be negative unless listed above    Vitals:   02/17/23 0337 02/17/23 0500 02/17/23 0843 02/17/23 1209  BP: 101/70  (!) 84/68 96/76  Pulse: 72  67 60  Resp: 18  18 16   Temp: 98 F (36.7 C)  (!) 97.5 F (36.4 C) 97.6 F (36.4 C)  TempSrc:      SpO2: 100%  96% 99%  Weight:  48.1 kg    Height:         Intake/Output Summary (Last 24 hours) at 02/17/2023 1342 Last data filed at 02/17/2023 0600 Gross per 24 hour  Intake 202.32 ml  Output 1400 ml  Net -1197.68 ml     PHYSICAL EXAM General: alert, well nourished, in no acute distress. HEENT: Normocephalic and atraumatic. Neck: No JVD.  Lungs: Normal respiratory effort. Clear bilaterally to auscultation. No wheezes, crackles, rhonchi.  Heart: HRRR. Normal S1 and S2 without gallops or murmurs. Radial & DP pulses 2+ bilaterally. Abdomen: Non-distended appearing.  Msk: Normal strength and tone for age. Extremities: No clubbing, cyanosis or edema.   Neuro: Alert and oriented X 3. Psych: Mood appropriate, affect congruent.    LABS: Basic Metabolic Panel: Recent Labs    02/15/23 1708 02/16/23 0543  NA 142  142  K 3.8 3.4*  CL 107 107  CO2 25 24  GLUCOSE 110* 82  BUN 17 14  CREATININE 0.86 0.72  CALCIUM 9.2 8.5*   Liver Function Tests: Recent Labs    02/15/23 1708  AST 47*  ALT 37  ALKPHOS 88  BILITOT 1.0  PROT 6.8  ALBUMIN 3.8   No results for input(s): "LIPASE", "AMYLASE" in the last 72 hours. CBC: Recent Labs    02/15/23 1708 02/17/23 0352  WBC 5.5 5.7  HGB 14.9 13.9  HCT 47.3* 43.2  MCV 92.2 88.9  PLT 202 189   Cardiac Enzymes: No results for input(s): "CKTOTAL", "CKMB", "CKMBINDEX", "TROPONINIHS" in the last 72 hours. BNP: Recent Labs    02/15/23 1708  BNP 3,557.9*   D-Dimer: Recent Labs    02/15/23 2000  DDIMER 2.27*   Hemoglobin A1C: No results for input(s): "HGBA1C" in the last 72 hours. Fasting Lipid Panel: No results for input(s): "CHOL", "HDL", "LDLCALC", "TRIG", "CHOLHDL", "LDLDIRECT" in the last 72 hours. Thyroid Function Tests: Recent Labs    02/15/23 2000  TSH 5.669*   Anemia Panel: No results for input(s): "VITAMINB12", "FOLATE", "FERRITIN", "TIBC", "IRON", "RETICCTPCT" in the last 72 hours.  CT Angio Chest PE W and/or Wo Contrast  Addendum Date: 02/16/2023   ADDENDUM REPORT: 02/16/2023 15:59 ADDENDUM: I have discussed this case with Dr. Myra Gianotti of vascular surgery. In review of the prior examination of 10/20/2020, the posterior and lateral segmental pulmonary  arteries of the right lower lobe are chronically occluded, as is the anteromedial segmental pulmonary artery of the left lower lobe. In the interval, there has developed irregular, eccentric mural thrombus within the inter lobar right pulmonary artery posteriorly, most in keeping with chronic mural thrombus given its eccentric configuration. There is no definite acute pulmonary embolus identified on this examination. While there is evidence of cardiogenic failure with progressive cardiomegaly, reflux of contrast into the hepatic venous system, development of small pericardial and  bilateral pleural effusions, right greater than left, there is no interventricular septal deviation or inversion of the RV/LV ratio to suggest acute right heart strain. Electronically Signed   By: Helyn Numbers M.D.   On: 02/16/2023 15:59   Result Date: 02/16/2023 CLINICAL DATA:  Abnormal EKG and 8 pound weight gain in 30 days. EXAM: CT ANGIOGRAPHY CHEST WITH CONTRAST TECHNIQUE: Multidetector CT imaging of the chest was performed using the standard protocol during bolus administration of intravenous contrast. Multiplanar CT image reconstructions and MIPs were obtained to evaluate the vascular anatomy. RADIATION DOSE REDUCTION: This exam was performed according to the departmental dose-optimization program which includes automated exposure control, adjustment of the mA and/or kV according to patient size and/or use of iterative reconstruction technique. CONTRAST:  75mL OMNIPAQUE IOHEXOL 350 MG/ML SOLN COMPARISON:  Oct 20, 2020 FINDINGS: Cardiovascular: Marked severity intraluminal low attenuation is seen within the posterior aspect of the proximal, mid and distal portions of the right pulmonary artery. There is moderate severity cardiomegaly with mild right heart strain (RV/LV ratio of 1.13). A small pericardial effusion is noted. Mediastinum/Nodes: No enlarged mediastinal, hilar, or axillary lymph nodes. Thyroid gland, trachea, and esophagus demonstrate no significant findings. Lungs/Pleura: Mild areas of scarring and/or atelectasis are present within the bilateral apices, right middle lobe and bilateral lung bases. A moderate to large, partially loculated right pleural effusion is seen. No pneumothorax is identified. Upper Abdomen: No acute abnormality. Musculoskeletal: No chest wall abnormality. No acute or significant osseous findings. Review of the MIP images confirms the above findings. IMPRESSION: 1. Marked severity right-sided pulmonary embolism with associated mild right heart strain. 2. Moderate to large,  partially loculated right pleural effusion. 3. Small pericardial effusion. Electronically Signed: By: Aram Candela M.D. On: 02/15/2023 22:13   US Venous Img Lower Bilateral  Result Date: 02/15/2023 CLINICAL DATA:  Bilateral lower extremity swelling EXAM: BILATERAL LOWER EXTREMITY VENOUS DOPPLER ULTRASOUND TECHNIQUE: Gray-scale sonography with compression, as well as color and duplex ultrasound, were performed to evaluate the deep venous system(s) from the level of the common femoral vein through the popliteal and proximal calf veins. COMPARISON:  None Available. FINDINGS: VENOUS Normal compressibility of the common femoral, superficial femoral, and popliteal veins, as well as the visualized calf veins. Visualized portions of profunda femoral vein and great saphenous vein unremarkable. No filling defects to suggest DVT on grayscale or color Doppler imaging. Doppler waveforms show normal direction of venous flow, normal respiratory plasticity and response to augmentation. OTHER None. Limitations: none IMPRESSION: Negative. Electronically Signed   By: Helyn Numbers M.D.   On: 02/15/2023 22:26   DG Chest Portable 1 View  Result Date: 02/15/2023 CLINICAL DATA:  Abnormal EKG, 8 pound weight gain EXAM: PORTABLE CHEST 1 VIEW COMPARISON:  01/08/2022 FINDINGS: Removal of right-sided central venous port. Cardiomegaly with small right greater than left pleural effusions. Airspace disease at the bases. Aortic atherosclerosis. No pneumothorax. IMPRESSION: Cardiomegaly with small right greater than left pleural effusions and bibasilar airspace disease, favor atelectasis. Electronically Signed  By: Jasmine Pang M.D.   On: 02/15/2023 18:59     ECHO pending  TELEMETRY reviewed by me 02/17/23: NSR with PVCs  EKG reviewed by me 02/17/23: NSR with PVCs  DATA reviewed by me 02/17/23: last 24h vitals tele labs imaging I/O primary notes    ASSESSMENT AND PLAN:  Principal Problem:   Acute combined systolic and  diastolic CHF, NYHA class 1 (HCC) Active Problems:   History of squamous cell carcinoma   HTN (hypertension)   Hyperlipidemia, mixed   Alzheimer's dementia without behavioral disturbance (HCC)   Malignant neoplasm of upper-outer quadrant of left breast in female, estrogen receptor negative (HCC)   History of pulmonary embolism (HCC)    Right-sided PE with associated mild right heart strain and small pericardial effusion. Suspect provoked by malignancy, in addition to prior history of DVT/PE Patient tentatively going for thrombectomy with vascular surgery on Monday 02/18/2023. Continue heparin gtt. Minimal right heart strain per CTA.     HFpEF 55-60 with grade I DD% Repeat echocardiogram pending. Continue Lasix 40 mg IV BID. Can likely switch to PO Lasix on Monday.     Hypertension BP very well controlled at this time     Alzheimer's dementia Continue with home regimen.     Hyperlipidemia Continue statin   Clotilde Dieter, DO 02/17/2023, 1:42 PM [facility] Cardiology

## 2023-02-17 NOTE — Progress Notes (Signed)
ANTICOAGULATION CONSULT NOTE  Pharmacy Consult for heparin infusion Indication: VTE Tx / PE  No Known Allergies  Patient Measurements: Height: 5\' 2"  (157.5 cm) Weight: 51 kg (112 lb 7 oz) IBW/kg (Calculated) : 50.1 Heparin Dosing Weight: 49 kg  Vital Signs: Temp: 98 F (36.7 C) (09/08 0337) Temp Source: Oral (09/07 2013) BP: 101/70 (09/08 0337) Pulse Rate: 72 (09/08 0337)  Labs: Recent Labs    02/15/23 1708 02/15/23 2327 02/16/23 0543 02/16/23 0746 02/16/23 1545 02/17/23 0352  HGB 14.9  --   --   --   --  13.9  HCT 47.3*  --   --   --   --  43.2  PLT 202  --   --   --   --  189  APTT  --  29  --   --   --   --   LABPROT  --  14.5  --   --   --   --   INR  --  1.1  --   --   --   --   HEPARINUNFRC  --   --   --  0.42 0.39 0.38  CREATININE 0.86  --  0.72  --   --   --     Estimated Creatinine Clearance: 45.1 mL/min (by C-G formula based on SCr of 0.72 mg/dL).   Medical History: Past Medical History:  Diagnosis Date   Anemia    Anxiety    Arthritis    NECK   AVM (arteriovenous malformation) brain    Breast cancer (HCC)    Cancer (HCC)    BASAL CELL AND MELANOMA   DAVF (dural arteriovenous fistula) 2015   Dementia (HCC)    DVT (deep venous thrombosis) (HCC)    Family history of adverse reaction to anesthesia    niece has to come out of anesthesia slow   HLD (hyperlipidemia)    Hypertension    PE (pulmonary thromboembolism) (HCC)    Pneumonia     Assessment: Pt is a 77 yo female presenting to ED from SNF d/t abnormal EKG and 8lb weight gain in 30 days, found with "marked severity right-sided pulmonary embolism with associated mild right heart strain."  9/7 0746 HL 0.42  9/7 1545 HL 0.39 9/8 0352 HL 0.38  Goal of Therapy:  Heparin level 0.3-0.7 units/ml Monitor platelets by anticoagulation protocol: Yes   Plan: Heparin level is therapeutic x 3.  Will continue heparin infusion at 800 units/hr.  Recheck heparin level tomorrow AM.  CBC daily while  on heparin.   Otelia Sergeant, PharmD, [facility] 02/17/2023 4:47 AM

## 2023-02-17 NOTE — Evaluation (Signed)
Occupational Therapy Evaluation Patient Details Name: Patricia Miller MRN: [id number] DOB: 1945/09/05 Today's Date: 02/17/2023   History of Present Illness 77 y.o. female  with a past medical history of breast cancer s/p BL mastectomy and 16 weeks of chemotherapy, prior PE/DVT, and diastolic heart failure who presented to the ED on 02/15/2023 for shortness of breath and edema. 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 Per vascular surgery no surgical intervention at this time. Anti-coagulation >24hrs prior to initiation of therapy.   Clinical Impression   Pt was seen for OT evaluation this date. Prior to hospital admission, pt was living in an ALF and denies AD use and reports being indep with ADL. Pt pleasant and oriented to self only but able to follow simple commands well. Pt completed bed mobility with mod indep, transfers with supervision, and ambulates in room and hallway with CGA and no AD with no overt LOB noted. Pt stood at the sink for grooming tasks with supervision and VC to initiate tasks. Pt presents to acute OT demonstrating mild impaired ADL performance and functional mobility 2/2 decreased strength, activity tolerance, balance, and safety awareness (See OT problem list for additional functional deficits).  Pt would benefit from additional skilled OT services to address noted impairments and functional limitations (see below for any additional details) in order to maximize safety and independence while minimizing falls risk and caregiver burden.    If plan is discharge home, recommend the following: A little help with walking and/or transfers;A little help with bathing/dressing/bathroom;Direct supervision/assist for medications management    Functional Status Assessment  Patient has had a recent decline in their functional status and demonstrates the ability to make significant improvements in function in a reasonable and predictable amount of time.  Equipment Recommendations  None  recommended by OT    Recommendations for Other Services       Precautions / Restrictions Precautions Precautions: Fall Restrictions Weight Bearing Restrictions: No      Mobility Bed Mobility Overal bed mobility: Modified Independent                  Transfers Overall transfer level: Needs assistance Equipment used: None Transfers: Sit to/from Stand Sit to Stand: Supervision                  Balance Overall balance assessment: Needs assistance Sitting-balance support: No upper extremity supported, Feet supported Sitting balance-Leahy Scale: Good     Standing balance support: No upper extremity supported, During functional activity Standing balance-Leahy Scale: Fair                             ADL either performed or assessed with clinical judgement   ADL Overall ADL's : Needs assistance/impaired     Grooming: Standing;Supervision/safety;Oral care;Wash/dry face;Wash/dry hands           Upper Body Dressing : Sitting;Set up;Supervision/safety   Lower Body Dressing: Sitting/lateral leans;Supervision/safety               Functional mobility during ADLs: Contact guard assist       Vision         Perception         Praxis         Pertinent Vitals/Pain Pain Assessment Pain Assessment: No/denies pain     Extremity/Trunk Assessment Upper Extremity Assessment Upper Extremity Assessment: Generalized weakness   Lower Extremity Assessment Lower Extremity Assessment: Generalized weakness  Communication     Cognition Arousal: Alert Behavior During Therapy: WFL for tasks assessed/performed Overall Cognitive Status: No family/caregiver present to determine baseline cognitive functioning                                 General Comments: Oriented to self only, pleasant, and able to follow simple commands with VC     General Comments       Exercises     Shoulder Instructions      Home Living  Family/patient expects to be discharged to:: Assisted living Living Arrangements: Non-relatives/Friends (roommate) Available Help at Discharge: Friend(s);Available PRN/intermittently Type of Home: Apartment Home Access: Level entry     Home Layout: One level     Bathroom Shower/Tub: Producer, television/film/video: Standard     Home Equipment: Shower seat - built in          Prior Functioning/Environment Prior Level of Function : Needs assist;Patient poor historian/Family not available             Mobility Comments: Pt denies AD use, was able to walk to/from the dining hall without issue ADLs Comments: Pt reports being independent with basic ADL tasks, staff assist with medications        OT Problem List: Decreased strength;Decreased cognition;Decreased safety awareness;Impaired balance (sitting and/or standing)      OT Treatment/Interventions: Self-care/ADL training;Therapeutic activities;Therapeutic exercise;DME and/or AE instruction;Patient/family education;Balance training;Cognitive remediation/compensation    OT Goals(Current goals can be found in the care plan section) Acute Rehab OT Goals Patient Stated Goal: feel better OT Goal Formulation: With patient Time For Goal Achievement: 03/03/23 Potential to Achieve Goals: Good ADL Goals Additional ADL Goal #1: Pt will complete all aspects of dressing wiht remote supervision for safety, 2/2 opportunities. Additional ADL Goal #2: Pt will complete all aspects of showering with supervision for safety, 2/2 opportunities, DME PRN.  OT Frequency: Min 1X/week    Co-evaluation              AM-PAC OT "6 Clicks" Daily Activity     Outcome Measure Help from another person eating meals?: None Help from another person taking care of personal grooming?: None Help from another person toileting, which includes using toliet, bedpan, or urinal?: A Little Help from another person bathing (including washing, rinsing,  drying)?: A Little Help from another person to put on and taking off regular upper body clothing?: None Help from another person to put on and taking off regular lower body clothing?: A Little 6 Click Score: 21   End of Session Equipment Utilized During Treatment: Gait belt Nurse Communication: Mobility status  Activity Tolerance: Patient tolerated treatment well Patient left: in chair;with call bell/phone within reach;with chair alarm set  OT Visit Diagnosis: Other abnormalities of gait and mobility (R26.89)                Time: 0102-7253 OT Time Calculation (min): 26 min Charges:  OT General Charges $OT Visit: 1 Visit OT Evaluation $OT Eval Low Complexity: 1 Low OT Treatments $Self Care/Home Management : 8-22 mins  Arman Filter., MPH, MS, OTR/L ascom [phone] 02/17/23, 10:05 AM

## 2023-02-17 NOTE — Progress Notes (Signed)
Transition of Care [facility]) - Inpatient Brief Assessment   Patient Details  Name: Patricia Miller MRN: [id number] Date of Birth: [birth year]  Transition of Care [facility] CM/SW Contact:    Bing Quarry, RN Phone Number: 02/17/2023, 10:21 AM   Clinical Narrative: 02/17/23: 02/17/23: ED from [facility] for increasing SOB, abnormal ECG, 8 lb weight gain over 30 days per [facility] via ED triage notes.  Admitting diagnosis of pulmonary embolus, new onset CHF. Patient currently on Heparin gtt for PE. Thrombectomy tentatively planned for 02/18/23.  Patient oriented x1-2. PLOF independent with most ADL's, no assistive devices. PT/OT evaluations ordered.  PT and OT recommending post discharge HH at ALF upon medically ready discharge.  Family not present at the time of evaluations. Patient is poor historian.  Insurance: HTA PCP: Dr Bethann Punches [phone] Niece is POA. [name] (Niece) [phone] (Mobile)    Gabriel Cirri MSN RN CM  Transitions of Care Department [facility] [phone] Weekends Only   Transition of Care Asessment: Insurance and Status: Insurance coverage has been reviewed Patient has primary care physician: Yes Home environment has been reviewed: [facility] Prior level of function:: Per OT notes, Patient is poor historian with no family present at the time: "Pt denies AD use, was able to walk to/from the dining hall without issue  ADLs Comments: Pt reports being independent with basic ADL tasks, staff assist with medications" Prior/Current Home Services: No current home services (HH recommended for discharge when medically ready.) Social Determinants of Health Reivew: SDOH reviewed no interventions necessary Readmission risk has been reviewed: Yes (20% as of this date.) Transition of care needs: transition of care needs identified, TOC will continue to follow

## 2023-02-17 NOTE — Progress Notes (Signed)
[facility]  - [place] at [facility]   PATIENT NAME: Patricia Miller    MR#:  [id number]  DATE OF BIRTH:  1945/10/23  SUBJECTIVE:  no family in the room spoke with patient's niece over the phone   Patient overall doing well. Good urine output with IV Lasix. Sats more than 92% on room air. No complaints. At baseline has dementia. No issues per RN.   VITALS:  Blood pressure 96/76, pulse 60, temperature 97.6 F (36.4 C), resp. rate 16, height 5\' 2"  (1.575 m), weight 48.1 kg, SpO2 99%.  PHYSICAL EXAMINATION:   GENERAL:  77 y.o.-year-old patient with no acute distress.  LUNGS: decreased breath sounds bilaterally, no wheezing. bibasilar rales present CARDIOVASCULAR: S1, S2 normal. No murmur   ABDOMEN: Soft, nontender, nondistended. Bowel sounds present.  EXTREMITIES: + edema b/l.    NEUROLOGIC: nonfocal  patient is alert and awake  LABORATORY PANEL:  CBC Recent Labs  Lab 02/17/23 0352  WBC 5.7  HGB 13.9  HCT 43.2  PLT 189    Chemistries  Recent Labs  Lab 02/15/23 1708 02/16/23 0543  NA 142 142  K 3.8 3.4*  CL 107 107  CO2 25 24  GLUCOSE 110* 82  BUN 17 14  CREATININE 0.86 0.72  CALCIUM 9.2 8.5*  AST 47*  --   ALT 37  --   ALKPHOS 88  --   BILITOT 1.0  --    Cardiac Enzymes No results for input(s): "TROPONINI" in the last 168 hours. RADIOLOGY:  CT Angio Chest PE W and/or Wo Contrast  Addendum Date: 02/16/2023   ADDENDUM REPORT: 02/16/2023 15:59 ADDENDUM: I have discussed this case with Dr. Myra Gianotti of vascular surgery. 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 In the interval, there has developed irregular, eccentric mural thrombus within the inter lobar right pulmonary artery posteriorly, most in keeping with chronic mural thrombus given its eccentric  configuration. There is no definite acute pulmonary embolus identified on this examination. While there is evidence of cardiogenic failure with progressive cardiomegaly, reflux of contrast into the hepatic venous system, development of small pericardial and bilateral pleural effusions, right greater than left, there is no interventricular septal deviation or inversion of the RV/LV ratio to suggest acute right heart strain. Electronically Signed   By: Helyn Numbers M.D.   On: 02/16/2023 15:59   Result Date: 02/16/2023 CLINICAL DATA:  Abnormal EKG and 8 pound weight gain in 30 days. EXAM: CT ANGIOGRAPHY CHEST WITH CONTRAST TECHNIQUE: Multidetector CT imaging of the chest was performed using the standard protocol during bolus administration of intravenous contrast. Multiplanar CT image reconstructions and MIPs were obtained to evaluate the vascular anatomy. RADIATION DOSE REDUCTION: This exam was performed according to the departmental dose-optimization program which includes automated exposure control, adjustment of the mA and/or kV according to patient size and/or use of iterative reconstruction technique. CONTRAST:  75mL OMNIPAQUE IOHEXOL 350 MG/ML SOLN COMPARISON:  Oct 20, 2020 FINDINGS: Cardiovascular: Marked severity intraluminal low attenuation is seen within the posterior aspect of the proximal, mid and distal portions of the right pulmonary artery. There is moderate severity cardiomegaly with mild right heart strain (RV/LV ratio of 1.13). A small pericardial effusion is noted. Mediastinum/Nodes: No enlarged mediastinal, hilar, or axillary lymph nodes. Thyroid gland, trachea, and esophagus demonstrate no significant findings. Lungs/Pleura: Mild areas of  scarring and/or atelectasis are present within the bilateral apices, right middle lobe and bilateral lung bases. A moderate to large, partially loculated right pleural effusion is seen. No pneumothorax is identified. Upper Abdomen: No acute abnormality.  Musculoskeletal: No chest wall abnormality. No acute or significant osseous findings. Review of the MIP images confirms the above findings. IMPRESSION: 1. Marked severity right-sided pulmonary embolism with associated mild right heart strain. 2. Moderate to large, partially loculated right pleural effusion. 3. Small pericardial effusion. Electronically Signed: By: Aram Candela M.D. On: 02/15/2023 22:13   US Venous Img Lower Bilateral  Result Date: 02/15/2023 CLINICAL DATA:  Bilateral lower extremity swelling EXAM: BILATERAL LOWER EXTREMITY VENOUS DOPPLER ULTRASOUND TECHNIQUE: Gray-scale sonography with compression, as well as color and duplex ultrasound, were performed to evaluate the deep venous system(s) from the level of the common femoral vein through the popliteal and proximal calf veins. COMPARISON:  None Available. FINDINGS: VENOUS Normal compressibility of the common femoral, superficial femoral, and popliteal veins, as well as the visualized calf veins. Visualized portions of profunda femoral vein and great saphenous vein unremarkable. No filling defects to suggest DVT on grayscale or color Doppler imaging. Doppler waveforms show normal direction of venous flow, normal respiratory plasticity and response to augmentation. OTHER None. Limitations: none IMPRESSION: Negative. Electronically Signed   By: Helyn Numbers M.D.   On: 02/15/2023 22:26   DG Chest Portable 1 View  Result Date: 02/15/2023 CLINICAL DATA:  Abnormal EKG, 8 pound weight gain EXAM: PORTABLE CHEST 1 VIEW COMPARISON:  01/08/2022 FINDINGS: Removal of right-sided central venous port. Cardiomegaly with small right greater than left pleural effusions. Airspace disease at the bases. Aortic atherosclerosis. No pneumothorax. IMPRESSION: Cardiomegaly with small right greater than left pleural effusions and bibasilar airspace disease, favor atelectasis. Electronically Signed   By: Jasmine Pang M.D.   On: 02/15/2023 18:59    Assessment and  Plan  Patricia Miller is a 77 y.o. female with medical history significant of basal cell carcinoma, dementia, previous DVT and PE with right heart strain, hyperlipidemia, essential hypertension, anxiety disorder, previous pneumonia, anxiety disorder, depression, history of breast cancer, who presents from [place] to memory care with healthcare power of attorney who is her niece send the patient has had 8 pound weight gain.  Patient was seen in the ER.  Has some shortness of breath but not hypoxic.  Has bilateral lower extremity edema that is persistent and heavy.  Patient with no prior history of CHF.  Congestive heart failure acute suspect diastolic in the setting of ?chronic  pulmonary embolism with right heart strain/ Acute Cor Pulmonale -- patient was started on IV heparin drip -- patient has history of DVT and PE in the past-- per knees she finishes six month course of Xarelto and then was asked to discontinue -- no history of bleeding. Patient will be on lifelong anticoagulation unless side effects happen -- vascular consultation with Dr. 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 Not a candidate for thrombectomy at present. Recommends switch to PO Xarelto -- cardiology consultation with Dr. Melton Alar for new onset CHF -- started on IV Lasix-- great eye urine output. Will switch to oral Lasix tomorrow  Pulmonary embolism and history of DVT -- as above  Hypertension -- patient not on any blood pressure meds at home. BP stable  Depression with anxiety history of dementia -- continue Zyprexa, Zoloft  History of breast cancer -- not on any treatment at  present-- follows with Dr. Smith Robert -- she underwent neoadjuvant chemotherapy followed by bilateral mastectomy  Discussed with family over the phone. If remains stable will discharge tomorrow. Family is in agreement. Patient will return to [place] with home health PT    Procedures:  none Family communication : Niece Consuella Lose over the phone Consults : vascular, cardiology CODE STATUS: DNR DVT Prophylaxis : xarelto Level of care: Telemetry Cardiac Status is: Inpatient Remains inpatient appropriate because: CHF, PE    TOTAL TIME TAKING CARE OF THIS PATIENT: 35 minutes.  >50% time spent on counselling and coordination of care  Note: This dictation was prepared with Dragon dictation along with smaller phrase technology. Any transcriptional errors that result from this process are unintentional.  Enedina Finner M.D    [facility]   CC: Primary care physician; Danella Penton, MD

## 2023-02-17 NOTE — Evaluation (Signed)
Physical Therapy Evaluation Patient Details Name: Patricia Miller MRN: [id number] DOB: [birth year] Today's Date: 02/17/2023  History of Present Illness  77 y.o. female  with a past medical history of breast cancer s/p BL mastectomy and 16 weeks of chemotherapy, prior PE/DVT, and diastolic heart failure who presented to the ED on 02/15/2023 for shortness of breath and edema. 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 Per vascular surgery no surgical intervention at this time. Anti-coagulation >24hrs prior to initiation of therapy.   Clinical Impression  Pt received in chair alert and agreeable to participate in skilled PT. Pt is an ALF resident where as per pt she was Independent with functional mobility. Today' pt performed transfers STS with sup and ambulate with FWW with SUP to CGA around the nurses station. PT referred pt to mobility specialist and PT will continue in acute care. Pt will benefit form continued PT beyond acute.       If plan is discharge home, recommend the following: A little help with walking and/or transfers;A little help with bathing/dressing/bathroom;Assistance with cooking/housework;Direct supervision/assist for medications management;Help with stairs or ramp for entrance;Assist for transportation   Can travel by private vehicle        Equipment Recommendations BSC/3in1;Rolling walker (2 wheels)  Recommendations for Other Services       Functional Status Assessment Patient has had a recent decline in their functional status and demonstrates the ability to make significant improvements in function in a reasonable and predictable amount of time.     Precautions / Restrictions Precautions Precautions: Fall Restrictions Weight Bearing Restrictions: No      Mobility  Bed Mobility               General bed mobility comments: received in chair.    Transfers Overall transfer level: Needs assistance Equipment used: Rolling walker (2 wheels) Transfers: Sit to/from  Stand Sit to Stand: Supervision           General transfer comment: slow.    Ambulation/Gait Ambulation/Gait assistance: Contact guard assist, Supervision Gait Distance (Feet): 200 Feet Assistive device: Rolling walker (2 wheels) Gait Pattern/deviations: Step-through pattern Gait velocity: dec        Stairs            Wheelchair Mobility     Tilt Bed    Modified Rankin (Stroke Patients Only)       Balance Overall balance assessment: Needs assistance Sitting-balance support: No upper extremity supported Sitting balance-Leahy Scale: Good     Standing balance support: Bilateral upper extremity supported Standing balance-Leahy Scale: Good Standing balance comment: needs AD 2/2 to generalized weakness Single Leg Stance - Right Leg: 6 Single Leg Stance - Left Leg: 10 Tandem Stance - Right Leg: 10 Tandem Stance - Left Leg: 10                     Pertinent Vitals/Pain Pain Assessment Pain Assessment: No/denies pain    Home Living Family/patient expects to be discharged to:: Assisted living Living Arrangements: Non-relatives/Friends Available Help at Discharge: Friend(s);Available PRN/intermittently Type of Home: Apartment Home Access: Level entry       Home Layout: One level Home Equipment: Shower seat - built in      Prior Function Prior Level of Function : Needs assist;Patient poor historian/Family not available             Mobility Comments: Pt denies AD use, was able to walk to/from the dining hall without issue ADLs Comments: Pt reports being  independent with basic ADL tasks, staff assist with medications     Extremity/Trunk Assessment   Upper Extremity Assessment Upper Extremity Assessment: Generalized weakness    Lower Extremity Assessment Lower Extremity Assessment: Generalized weakness       Communication      Cognition Arousal: Alert Behavior During Therapy: WFL for tasks assessed/performed Overall Cognitive  Status: Difficult to assess                                 General Comments: Oriented to self only, pleasant, and able to follow simple commands with VC        General Comments      Exercises     Assessment/Plan    PT Assessment Patient needs continued PT services  PT Problem List Decreased strength;Decreased activity tolerance;Decreased balance;Decreased safety awareness;Cardiopulmonary status limiting activity       PT Treatment Interventions Gait training;Functional mobility training;Therapeutic activities;Therapeutic exercise;Balance training;Patient/family education    PT Goals (Current goals can be found in the Care Plan section)  Acute Rehab PT Goals Patient Stated Goal: unable PT Goal Formulation: Patient unable to participate in goal setting Time For Goal Achievement: 03/03/23 Potential to Achieve Goals: Good    Frequency Min 1X/week     Co-evaluation               AM-PAC PT "6 Clicks" Mobility  Outcome Measure Help needed turning from your back to your side while in a flat bed without using bedrails?: A Little Help needed moving from lying on your back to sitting on the side of a flat bed without using bedrails?: A Little Help needed moving to and from a bed to a chair (including a wheelchair)?: A Little Help needed standing up from a chair using your arms (e.g., wheelchair or bedside chair)?: A Little Help needed to walk in hospital room?: None Help needed climbing 3-5 steps with a railing? : None 6 Click Score: 20    End of Session Equipment Utilized During Treatment: Gait belt Activity Tolerance: Patient tolerated treatment well Patient left: in chair;with bed alarm set;with call bell/phone within reach Nurse Communication: Mobility status PT Visit Diagnosis: Muscle weakness (generalized) (M62.81)    Time: 6578-4696 PT Time Calculation (min) (ACUTE ONLY): 10 min   Charges:   PT Evaluation $PT Eval Low Complexity: 1 Low   PT  General Charges $$ ACUTE PT VISIT: 1 Visit        Janet Berlin PT DPT 1:18 PM,02/17/23

## 2023-02-18 ENCOUNTER — Other Ambulatory Visit (HOSPITAL_COMMUNITY): Payer: Self-pay

## 2023-02-18 ENCOUNTER — Telehealth: Payer: Self-pay | Admitting: Pharmacist

## 2023-02-18 ENCOUNTER — Encounter: Payer: Self-pay | Admitting: Oncology

## 2023-02-18 DIAGNOSIS — I2699 Other pulmonary embolism without acute cor pulmonale: Secondary | ICD-10-CM | POA: Diagnosis not present

## 2023-02-18 DIAGNOSIS — I5041 Acute combined systolic (congestive) and diastolic (congestive) heart failure: Secondary | ICD-10-CM | POA: Diagnosis not present

## 2023-02-18 DIAGNOSIS — R635 Abnormal weight gain: Secondary | ICD-10-CM | POA: Diagnosis not present

## 2023-02-18 DIAGNOSIS — R269 Unspecified abnormalities of gait and mobility: Secondary | ICD-10-CM | POA: Diagnosis not present

## 2023-02-18 DIAGNOSIS — I509 Heart failure, unspecified: Secondary | ICD-10-CM | POA: Diagnosis not present

## 2023-02-18 DIAGNOSIS — K5900 Constipation, unspecified: Secondary | ICD-10-CM | POA: Diagnosis not present

## 2023-02-18 LAB — ECHOCARDIOGRAM COMPLETE
Area-P 1/2: 4.68 cm2
Height: 62 in
S' Lateral: 4.1 cm
Weight: 1798.95 [oz_av]

## 2023-02-18 LAB — CBC
HCT: 39.8 % (ref 36.0–46.0)
Hemoglobin: 13 g/dL (ref 12.0–15.0)
MCH: 29.3 pg (ref 26.0–34.0)
MCHC: 32.7 g/dL (ref 30.0–36.0)
MCV: 89.6 fL (ref 80.0–100.0)
Platelets: 191 10*3/uL (ref 150–400)
RBC: 4.44 MIL/uL (ref 3.87–5.11)
RDW: 14.4 % (ref 11.5–15.5)
WBC: 5.1 10*3/uL (ref 4.0–10.5)
nRBC: 0 % (ref 0.0–0.2)

## 2023-02-18 LAB — BASIC METABOLIC PANEL
Anion gap: 10 (ref 5–15)
BUN: 17 mg/dL (ref 8–23)
CO2: 25 mmol/L (ref 22–32)
Calcium: 8.7 mg/dL — ABNORMAL LOW (ref 8.9–10.3)
Chloride: 107 mmol/L (ref 98–111)
Creatinine, Ser: 0.77 mg/dL (ref 0.44–1.00)
GFR, Estimated: >60 mL/min (ref >60)
Glucose, Bld: 86 mg/dL (ref 70–99)
Potassium: 4 mmol/L (ref 3.5–5.1)
Sodium: 142 mmol/L (ref 135–145)

## 2023-02-18 MED ORDER — FUROSEMIDE 20 MG PO TABS: 20.0000 mg | ORAL_TABLET | Freq: Every day | ORAL | 1 refills | Status: AC

## 2023-02-18 MED ORDER — RIVAROXABAN (XARELTO) VTE STARTER PACK (15 & 20 MG): ORAL_TABLET | ORAL | 0 refills | Status: AC

## 2023-02-18 MED ORDER — POLYETHYLENE GLYCOL 3350 17 G PO PACK
17.0000 g | PACK | ORAL | Status: DC
  Administered 2023-02-18: 17 g via ORAL
  Filled 2023-02-18: qty 1

## 2023-02-18 NOTE — TOC Transition Note (Addendum)
Transition of Care [facility]) - CM/SW Discharge Note   Patient Details  Name: Patricia Miller MRN: [id number] Date of Birth: 20-Aug-1945  Transition of Care [facility]) CM/SW Contact:  [name], RN Phone Number: 02/18/2023, 1:56 PM   Clinical Narrative:    Patient discharged to [facility]. Spoke with her POA, [name]. She is agreeable to patient having HHPT. She did not have a preference of an [facility] agency. She was advised patient still needs DME for [facility] and RW.  Request sent to [name] from [employer].  FL2 faxed to [phone].  Attempt to reach [name] from [facility]. No answer. Left a message advising DME will be sent to facility, [facility] was arranged and FL2 was faxed.    2:00pm Spoke with [name] from [place]. All DME , and HH confirmed.  TOC signing off.          Patient Goals and CMS Choice      Discharge Placement                         Discharge Plan and Services Additional resources added to the After Visit Summary for                                       Social Determinants of Health (SDOH) Interventions SDOH Screenings   Food Insecurity: No Food Insecurity (02/16/2023)  Housing: Low Risk  (02/16/2023)  Transportation Needs: No Transportation Needs (02/16/2023)  Utilities: Not At Risk (02/16/2023)  Tobacco Use: Medium Risk (02/15/2023)     Readmission Risk Interventions     No data to display

## 2023-02-18 NOTE — Telephone Encounter (Signed)
Rx on discharge sent to [facility] in [place] as preferred pharmacy ion record. Nice Geographical information systems officer , need to [employer] to [employer] prefer pharmacy [facility])  Xarelto start Pack and Furosemide RX called in as verbal prescriptions Verified additional meds also active prescriptions, Sertraline, Olanzapine and clozapine

## 2023-02-18 NOTE — Group Note (Deleted)

## 2023-02-18 NOTE — [place] FL2 (Deleted)
[place] MEDICAID FL2 LEVEL OF CARE FORM     IDENTIFICATION  Patient Name: Patricia Miller Birthdate: Feb 09, 1946 Sex: female Admission Date (Current Location): 02/15/2023  [facility] and [place] Number:  Chiropodist and Address:  [facility], [address], [place], [place] [address]      Provider Number: [id number]  Attending Physician Name and Address:  No att. providers found  Relative Name and Phone Number:  [name]    Current Level of Care: Hospital Recommended Level of Care: Assisted Living Facility Prior Approval Number:    Date Approved/Denied:   PASRR Number:    Discharge Plan: Other (Comment) (ALF)    Current Diagnoses: Patient Active Problem List   Diagnosis Date Noted   History of pulmonary embolism (HCC) 02/15/2023   Acute combined systolic and diastolic CHF, NYHA class 1 (HCC) 02/15/2023   Breast cancer (HCC) 07/18/2022   Anemia due to antineoplastic chemotherapy 06/12/2022   Malignant neoplasm of upper-outer quadrant of left breast in female, estrogen receptor negative (HCC) 12/29/2021   Cancer (HCC) 12/22/2021   Premature ventricular contractions 12/05/2020   Bronchiectasis without complication (HCC) 07/26/2020   Alzheimer's dementia without behavioral disturbance (HCC) 06/29/2020   Memory loss    Acute blood loss anemia    Acute pulmonary embolism with acute cor pulmonale (HCC) 02/29/2020   Acute deep vein thrombosis (DVT) of femoral vein of left lower extremity (HCC) 02/29/2020   RLL pneumonia 02/29/2020   Pulmonary nodule 02/29/2020   Hormone replacement therapy (HRT) 02/29/2020   S/P laparoscopic hysterectomy 12/2019 02/29/2020   HTN (hypertension) 02/29/2020   Hypokalemia    Hyperlipidemia, mixed 12/15/2019   History of squamous cell carcinoma 02/03/2018   Medicare annual wellness visit, initial 02/03/2018    Hx of Tubular adenoma 11/29/2016   Hormone replacement therapy (postmenopausal) 03/22/2015    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/08/2013    Orientation RESPIRATION BLADDER Height & Weight     Self, Place  Normal Continent Weight: 47.8 kg Height:  5\' 2"  (157.5 cm)  BEHAVIORAL SYMPTOMS/MOOD NEUROLOGICAL BOWEL NUTRITION STATUS  Other (Comment) (n/a)  (n/a) Continent Diet (Heart)  AMBULATORY STATUS COMMUNICATION OF NEEDS Skin   Limited Assist Verbally Normal                       Personal Care Assistance Level of Assistance  Bathing, Dressing Bathing Assistance: Limited assistance   Dressing Assistance: Limited assistance     Functional Limitations Info             SPECIAL CARE FACTORS FREQUENCY  PT (By licensed PT)     PT Frequency: Min 2xweekly              Contractures Contractures Info: Not present    Additional Factors Info  Code Status Code Status Info: DNR-Limited             Current Medications (02/18/2023):  This is the current hospital active medication list Current Facility-Administered Medications  Medication Dose Route Frequency Provider Last Rate Last Admin   0.9 %  sodium chloride infusion  250 mL Intravenous PRN Rometta Emery, MD       acetaminophen (TYLENOL) tablet 650 mg  650 mg Oral Q4H PRN Rometta Emery, MD   650 mg at 02/16/23 2204   clonazePAM (KLONOPIN) tablet 0.5 mg  0.5 mg Oral Q12H PRN Enedina Finner, MD   0.5 mg at 02/17/23 2105   furosemide (LASIX) tablet 20 mg  20 mg Oral Daily Enedina Finner, MD   20 mg at 02/18/23 0926   OLANZapine (ZYPREXA) tablet 5 mg  5 mg Oral QHS Enedina Finner, MD   5 mg at 02/17/23 2105   ondansetron (ZOFRAN) injection 4 mg  4 mg Intravenous Q6H PRN Rometta Emery, MD       polyethylene glycol (MIRALAX / GLYCOLAX) packet 17 g  17 g Oral Q M,W,F Drusilla Kanner, RPH   17 g at 02/18/23 8657   Rivaroxaban (XARELTO) tablet 15 mg  15 mg Oral BID WC Tressie Ellis, RPH   15 mg at 02/18/23 8469   Followed by   Melene Muller ON 03/10/2023] rivaroxaban (XARELTO) tablet 20 mg  20 mg Oral Q supper Tressie Ellis, RPH        sertraline (ZOLOFT) tablet 25 mg  25 mg Oral Daily Enedina Finner, MD   25 mg at 02/18/23 0926   sertraline (ZOLOFT) tablet 50 mg  50 mg Oral Daily Enedina Finner, MD   50 mg at 02/18/23 0934   sodium chloride flush (NS) 0.9 % injection 3 mL  3 mL Intravenous Q12H Earlie Lou L, MD   3 mL at 02/17/23 2107   sodium chloride flush (NS) 0.9 % injection 3 mL  3 mL Intravenous PRN Rometta Emery, MD       Current Outpatient Medications  Medication Sig Dispense Refill   clonazePAM (KLONOPIN) 0.5 MG tablet Take 1 tablet by mouth every 12 (twelve) hours as needed (for agitation).     MIRALAX 17 GM/SCOOP powder Take 17 g by mouth every Monday, Wednesday, and Friday.     OLANZapine (ZYPREXA) 5 MG tablet Take 5 mg by mouth at bedtime.     RIVAROXABAN (XARELTO) VTE STARTER PACK (15 & 20 MG) Follow package directions: Take one 15mg  tablet by mouth twice a day. On day 22, switch to one 20mg  tablet once a day. Take with food. 51 each 0   sertraline (ZOLOFT) 25 MG tablet Take 25 mg by mouth daily. In addition to 50 mg for a total daily dose of 75 mg     sertraline (ZOLOFT) 50 MG tablet Take 50 mg by mouth daily.     [START ON 02/19/2023] furosemide (LASIX) 20 MG tablet Take 1 tablet (20 mg total) by mouth daily. 30 tablet 1     Discharge Medications: Please see discharge summary for a list of discharge medications.  Relevant Imaging Results:  Relevant Lab Results:   Additional Information SSN# [id number]  [name], RN

## 2023-02-18 NOTE — [place] (Deleted)
[place] LEVEL OF CARE FORM     IDENTIFICATION  Patient Name: Patricia Miller Birthdate: [birth year] Sex: female Admission Date (Current Location): 02/15/2023  [facility] and [place] Number:  Chiropodist and Address:  [facility], [address] [place], [place] [address]      Provider Number: [id number]  Attending Physician Name and Address:  No att. providers found  Relative Name and Phone Number:  [name]    Current Level of Care: Hospital Recommended Level of Care: Assisted Living Facility Prior Approval Number:    Date Approved/Denied:   PASRR Number:    Discharge Plan: Other (Comment) (ALF)    Current Diagnoses: Patient Active Problem List   Diagnosis Date Noted   History of pulmonary embolism (HCC) 02/15/2023   Acute combined systolic and diastolic CHF, NYHA class 1 (HCC) 02/15/2023   Breast cancer (HCC) 07/18/2022   Anemia due to antineoplastic chemotherapy 06/12/2022   Malignant neoplasm of upper-outer quadrant of left breast in female, estrogen receptor negative (HCC) 12/29/2021   Cancer (HCC) 12/22/2021   Premature ventricular contractions 12/05/2020   Bronchiectasis without complication (HCC) 07/26/2020   Alzheimer's dementia without behavioral disturbance (HCC) 06/29/2020   Memory loss    Acute blood loss anemia    Acute pulmonary embolism with acute cor pulmonale (HCC) 02/29/2020   Acute deep vein thrombosis (DVT) of femoral vein of left lower extremity (HCC) 02/29/2020   RLL pneumonia 02/29/2020   Pulmonary nodule 02/29/2020   Hormone replacement therapy (HRT) 02/29/2020   S/P laparoscopic hysterectomy 12/2019 02/29/2020   HTN (hypertension) 02/29/2020   Hypokalemia    Hyperlipidemia, mixed 12/15/2019   History of squamous cell carcinoma 02/03/2018   Medicare annual wellness visit, initial 02/03/2018    Hx of Tubular adenoma 11/29/2016   Hormone replacement therapy (postmenopausal) 03/22/2015    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/08/2013    Orientation RESPIRATION BLADDER Height & Weight     Self, Place  Normal Continent Weight: 47.8 kg Height:  5\' 2"  (157.5 cm)  BEHAVIORAL SYMPTOMS/MOOD NEUROLOGICAL BOWEL NUTRITION STATUS  Other (Comment) (n/a)  (n/a) Continent Diet (Heart)  AMBULATORY STATUS COMMUNICATION OF NEEDS Skin   Limited Assist Verbally Normal                       Personal Care Assistance Level of Assistance  Bathing, Dressing Bathing Assistance: Limited assistance   Dressing Assistance: Limited assistance     Functional Limitations Info             SPECIAL CARE FACTORS FREQUENCY  PT (By licensed PT)     PT Frequency: Min 2xweekly              Contractures Contractures Info: Not present    Additional Factors Info  Code Status Code Status Info: DNR-Limited             Current Medications (02/18/2023):  This is the current hospital active medication list TAKE these medications     clonazePAM 0.5 MG tablet Commonly known as: KLONOPIN Take 1 tablet by mouth every 12 (twelve) hours as needed (for agitation).    furosemide 20 MG tablet Commonly known as: LASIX Take 1 tablet (20 mg total) by mouth daily. Start taking on: February 19, 2023    MiraLax 17 GM/SCOOP powder Generic drug: polyethylene glycol powder Take 17 g by mouth every Monday, Wednesday, and Friday.    OLANZapine 5 MG tablet Commonly known as: ZYPREXA Take 5  mg by mouth at bedtime.    Rivaroxaban Starter Pack (15 mg and 20 mg) Commonly known as: XARELTO STARTER PACK Follow package directions: Take one 15mg  tablet by mouth twice a day. On day 22, switch to one 20mg  tablet once a day. Take with food.    sertraline 50 MG tablet Commonly known as: ZOLOFT Take 50 mg by mouth daily.    sertraline 25 MG tablet Commonly known as: ZOLOFT Take 25 mg by mouth daily. In addition to 50 mg for a total daily dose of 75 mg     Discharge Medications: Please see discharge summary for  a list of discharge medications.  Relevant Imaging Results:  Relevant Lab Results:   Additional Information SSN# [id number]  [name], RN

## 2023-02-18 NOTE — Discharge Summary (Signed)
Physician Discharge Summary   Patient: Patricia DYSINGER MRN: [id number] DOB: [birth year]  Admit date:     02/15/2023  Discharge date: 02/18/23  Discharge Physician: Enedina Finner   PCP: Danella Penton, MD   Recommendations at discharge:   F/u Dr Melton Alar in 1 week for CHF f/u  F/u Dr Bethann Punches in 1-2 weeks F/u Dr Lorretta Harp in 2-3 weeks for PE f/u  Discharge Diagnoses: Principal Problem:   Acute combined systolic and diastolic CHF, NYHA class 1 (HCC) Active Problems:   History of squamous cell carcinoma   HTN (hypertension)   Hyperlipidemia, mixed   Alzheimer's dementia without behavioral disturbance (HCC)   Malignant neoplasm of upper-outer quadrant of left breast in female, estrogen receptor negative (HCC)   History of pulmonary embolism (HCC)  EMMILEE Miller is a 78 y.o. female with medical history significant of basal cell carcinoma, dementia, previous DVT and PE with right heart strain, hyperlipidemia, essential hypertension, anxiety disorder, previous pneumonia, anxiety disorder, depression, history of breast cancer, who presents from [facility] to memory care with healthcare power of attorney who is her niece send the patient has had 8 pound weight gain.  Patient was seen in the ER.  Has some shortness of breath but not hypoxic.  Has bilateral lower extremity edema that is persistent and heavy.  Patient with no prior history of CHF.   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 Patient will be on lifelong anticoagulation unless side effects happen -- vascular consultation with Dr. 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 Not a candidate for  thrombectomy at present. Recommends switch to PO Xarelto -- cardiology consultation with Dr. Melton Alar for new onset CHF -- started on IV Lasix-- great eye urine output. Will switch to oral Lasix tomorrow --good uop change to po lasix 30 mg every day --GDMT for chf as out pt given soft bp --EF 25-30% per echo. D/w niece results of echo. --?life vest discussion per Cardiology as OP   Pulmonary embolism and history of DVT -- as above   Hypertension -- patient not on any blood pressure meds at home. BP stable   Depression with anxiety history of dementia -- continue Zyprexa, Zoloft   History of breast cancer -- not on any treatment at present-- follows with Dr. Smith Robert -- she underwent neoadjuvant chemotherapy followed by bilateral mastectomy   Niece Consuella Lose is in agreement. Patient will [facility] with [facility] health PT     Consultants: vascular, cardiology  Disposition: Assisted living Diet recommendation:  Discharge Diet Orders (From admission, onward)     Start     Ordered   02/18/23 0000  Diet - low sodium heart healthy        02/18/23 0949           Cardiac diet DISCHARGE MEDICATION: Allergies as of 02/18/2023   No Known Allergies      Medication List     STOP taking these medications    buPROPion 150 MG 24 hr tablet Commonly known as: WELLBUTRIN XL   divalproex 500 MG DR tablet Commonly known as: DEPAKOTE   DULoxetine 20 MG capsule Commonly known as:  CYMBALTA       TAKE these medications    clonazePAM 0.5 MG tablet Commonly known as: KLONOPIN Take 1 tablet by mouth every 12 (twelve) hours as needed (for agitation).   furosemide 20 MG tablet Commonly known as: LASIX Take 1 tablet (20 mg total) by mouth daily. Start taking on: February 19, 2023   MiraLax 17 GM/SCOOP powder Generic drug: polyethylene glycol powder Take 17 g by mouth every Monday, Wednesday, and Friday.   OLANZapine 5 MG tablet Commonly known as: ZYPREXA Take 5 mg by  mouth at bedtime.   Rivaroxaban Starter Pack (15 mg and 20 mg) Commonly known as: XARELTO STARTER PACK Follow package directions: Take one 15mg  tablet by mouth twice a day. On day 22, switch to one 20mg  tablet once a day. Take with food.   sertraline 50 MG tablet Commonly known as: ZOLOFT Take 50 mg by mouth daily.   sertraline 25 MG tablet Commonly known as: ZOLOFT Take 25 mg by mouth daily. In addition to 50 mg for a total daily dose of 75 mg        Follow-up Information     Custovic, Rozell Searing, DO. Go in 1 week(s).   Specialty: Cardiology Contact information: [address] [place] [address] [phone]         Danella Penton, MD. Schedule an appointment as soon as possible for a visit in 1 week(s).   Specialty: Internal Medicine Contact information: [phone] [facility] [address] [facility] [place] [address] [phone]         Schnier, Latina Craver, MD. Schedule an appointment as soon as possible for a visit in 2 week(s).   Specialties: Vascular Surgery, Cardiology, Radiology, Vascular Surgery Why: f/u PE Contact information: [address] [place] [address] [phone]                Discharge Exam: Filed Weights   02/16/23 0500 02/17/23 0500 02/18/23 0518  Weight: 51 kg 48.1 kg 47.8 kg   GENERAL:  77 y.o.-year-old patient with no acute distress.  LUNGS: decreased breath sounds bilaterally, no wheezing. bibasilar rales present CARDIOVASCULAR: S1, S2 normal. No murmur   ABDOMEN: Soft, nontender, nondistended. Bowel sounds present.  EXTREMITIES: improved edema b/l.    NEUROLOGIC: nonfocal  patient is alert and awake  Condition at discharge: fair  The results of significant diagnostics from this hospitalization (including imaging, microbiology, ancillary and laboratory) are listed below for reference.   Imaging Studies: ECHOCARDIOGRAM COMPLETE  Result Date: 02/18/2023    ECHOCARDIOGRAM REPORT    Patient Name:   Patricia Miller Date of Exam: 02/16/2023 Medical Rec #:  [id number]          Height:       62.0 in Accession #:    [id number]         Weight:       112.4 lb Date of Birth:  1946/02/14          BSA:          1.497 m Patient Age:    77 years           BP:           113/57 mmHg Patient Gender: F                  HR:           76 bpm. Exam Location:  [facility] Procedure: 2D Echo, Cardiac Doppler and Color  Doppler Indications:     I50.21 Acute Diastolic Heart Failure  History:         Patient has prior history of Echocardiogram examinations, most                  recent 01/03/2022. Risk Factors:Hypertension and Dyslipidemia.                  Pneumonia.  Sonographer:     Sedonia Small Rodgers-Jones RDCS Referring Phys:  [id number] Rometta Emery Diagnosing Phys: Rozell Searing Custovic IMPRESSIONS  1. Left ventricular ejection fraction, by estimation, is 25 to 30%. Left ventricular ejection fraction by PLAX is 27 %. The left ventricle has severely decreased function. The left ventricle demonstrates global hypokinesis. The left ventricular internal  cavity size was mildly dilated. Left ventricular diastolic parameters are consistent with Grade I diastolic dysfunction (impaired relaxation).  2. Right ventricular systolic function is low normal. The right ventricular size is normal.  3. A small pericardial effusion is present. There is no evidence of cardiac tamponade.  4. The mitral valve is grossly normal. Moderate mitral valve regurgitation.  5. The aortic valve is grossly normal. Aortic valve regurgitation is trivial. FINDINGS  Left Ventricle: Left ventricular ejection fraction, by estimation, is 25 to 30%. Left ventricular ejection fraction by PLAX is 27 %. The left ventricle has severely decreased function. The left ventricle demonstrates global hypokinesis. The left ventricular internal cavity size was mildly dilated. There is no left ventricular hypertrophy. Left ventricular diastolic parameters are consistent with Grade I  diastolic dysfunction (impaired relaxation). Right Ventricle: The right ventricular size is normal. No increase in right ventricular wall thickness. Right ventricular systolic function is low normal. Left Atrium: Left atrial size was normal in size. Right Atrium: Right atrial size was normal in size. Pericardium: A small pericardial effusion is present. There is no evidence of cardiac tamponade. Mitral Valve: The mitral valve is grossly normal. Moderate mitral valve regurgitation. Tricuspid Valve: The tricuspid valve is grossly normal. Tricuspid valve regurgitation is mild. Aortic Valve: The aortic valve is grossly normal. Aortic valve regurgitation is trivial. Pulmonic Valve: The pulmonic valve was grossly normal. Pulmonic valve regurgitation is not visualized. Aorta: The aortic root and ascending aorta are structurally normal, with no evidence of dilitation. IAS/Shunts: No atrial level shunt detected by color flow Doppler.  LEFT VENTRICLE PLAX 2D LV EF:         Left            Diastology                ventricular     LV e' medial:    6.31 cm/s                ejection        LV E/e' medial:  11.0                fraction by     LV e' lateral:   8.05 cm/s                PLAX is 27      LV E/e' lateral: 8.6                %. LVIDd:         4.70 cm LVIDs:         4.10 cm LV PW:         1.00 cm LV IVS:        0.80 cm LVOT  diam:     1.80 cm LV SV:         24 LV SV Index:   16 LVOT Area:     2.54 cm  RIGHT VENTRICLE             IVC RV Basal diam:  3.00 cm     IVC diam: 1.80 cm RV S prime:     10.20 cm/s LEFT ATRIUM             Index        RIGHT ATRIUM           Index LA diam:        3.60 cm 2.41 cm/m   RA Area:     14.70 cm LA Vol (A2C):   56.8 ml 37.95 ml/m  RA Volume:   35.90 ml  23.99 ml/m LA Vol (A4C):   36.7 ml 24.52 ml/m LA Biplane Vol: 48.1 ml 32.14 ml/m  AORTIC VALVE LVOT Vmax:   60.13 cm/s LVOT Vmean:  36.433 cm/s LVOT VTI:    0.094 m  AORTA Ao Root diam: 3.00 cm MITRAL VALVE MV Area (PHT): 4.68 cm     SHUNTS MV Decel Time: 162 msec    Systemic VTI:  0.09 m MV E velocity: 69.45 cm/s  Systemic Diam: 1.80 cm MV A velocity: 38.00 cm/s MV E/A ratio:  1.83 Sabina Custovic Electronically signed by Clotilde Dieter Signature Date/Time: 02/18/2023/9:18:49 AM    Final    CT Angio Chest PE W and/or Wo Contrast  Addendum Date: 02/16/2023   ADDENDUM REPORT: 02/16/2023 15:59 ADDENDUM: I have discussed this case with Dr. Myra Gianotti of vascular surgery. 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 In the interval, there has developed irregular, eccentric mural thrombus within the inter lobar right pulmonary artery posteriorly, most in keeping with chronic mural thrombus given its eccentric configuration. There is no definite acute pulmonary embolus identified on this examination. While there is evidence of cardiogenic failure with progressive cardiomegaly, reflux of contrast into the hepatic venous system, development of small pericardial and bilateral pleural effusions, right greater than left, there is no interventricular septal deviation or inversion of the RV/LV ratio to suggest acute right heart strain. Electronically Signed   By: Helyn Numbers M.D.   On: 02/16/2023 15:59   Result Date: 02/16/2023 CLINICAL DATA:  Abnormal EKG and 8 pound weight gain in 30 days. EXAM: CT ANGIOGRAPHY CHEST WITH CONTRAST TECHNIQUE: Multidetector CT imaging of the chest was performed using the standard protocol during bolus administration of intravenous contrast. Multiplanar CT image reconstructions and MIPs were obtained to evaluate the vascular anatomy. RADIATION DOSE REDUCTION: This exam was performed according to the departmental dose-optimization program which includes automated exposure control, adjustment of the mA and/or kV according to patient size and/or  use of iterative reconstruction technique. CONTRAST:  75mL OMNIPAQUE IOHEXOL 350 MG/ML SOLN COMPARISON:  Oct 20, 2020 FINDINGS: Cardiovascular: Marked severity intraluminal low attenuation is seen within the posterior aspect of the proximal, mid and distal portions of the right pulmonary artery. There is moderate severity cardiomegaly with mild right heart strain (RV/LV ratio of 1.13). A small pericardial effusion is noted. Mediastinum/Nodes: No enlarged mediastinal, hilar, or axillary lymph nodes. Thyroid gland, trachea, and esophagus demonstrate no significant findings. Lungs/Pleura: Mild areas of scarring and/or atelectasis are present within the  bilateral apices, right middle lobe and bilateral lung bases. A moderate to large, partially loculated right pleural effusion is seen. No pneumothorax is identified. Upper Abdomen: No acute abnormality. Musculoskeletal: No chest wall abnormality. No acute or significant osseous findings. Review of the MIP images confirms the above findings. IMPRESSION: 1. Marked severity right-sided pulmonary embolism with associated mild right heart strain. 2. Moderate to large, partially loculated right pleural effusion. 3. Small pericardial effusion. Electronically Signed: By: Aram Candela M.D. On: 02/15/2023 22:13   US Venous Img Lower Bilateral  Result Date: 02/15/2023 CLINICAL DATA:  Bilateral lower extremity swelling EXAM: BILATERAL LOWER EXTREMITY VENOUS DOPPLER ULTRASOUND TECHNIQUE: Gray-scale sonography with compression, as well as color and duplex ultrasound, were performed to evaluate the deep venous system(s) from the level of the common femoral vein through the popliteal and proximal calf veins. COMPARISON:  None Available. FINDINGS: VENOUS Normal compressibility of the common femoral, superficial femoral, and popliteal veins, as well as the visualized calf veins. Visualized portions of profunda femoral vein and great saphenous vein unremarkable. No filling defects  to suggest DVT on grayscale or color Doppler imaging. Doppler waveforms show normal direction of venous flow, normal respiratory plasticity and response to augmentation. OTHER None. Limitations: none IMPRESSION: Negative. Electronically Signed   By: Helyn Numbers M.D.   On: 02/15/2023 22:26   DG Chest Portable 1 View  Result Date: 02/15/2023 CLINICAL DATA:  Abnormal EKG, 8 pound weight gain EXAM: PORTABLE CHEST 1 VIEW COMPARISON:  01/08/2022 FINDINGS: Removal of right-sided central venous port. Cardiomegaly with small right greater than left pleural effusions. Airspace disease at the bases. Aortic atherosclerosis. No pneumothorax. IMPRESSION: Cardiomegaly with small right greater than left pleural effusions and bibasilar airspace disease, favor atelectasis. Electronically Signed   By: Jasmine Pang M.D.   On: 02/15/2023 18:59    Microbiology: Results for orders placed or performed during the hospital encounter of 02/28/20  Urine Culture     Status: Abnormal   Collection Time: 02/28/20  7:16 PM   Specimen: Urine, Random  Result Value Ref Range Status   Specimen Description   Final    URINE, RANDOM Performed at [facility], [address] [place], [place] [address]    Special Requests   Final    NONE Performed at [facility], [address] [place], [place] [address]    Culture MULTIPLE SPECIES PRESENT, SUGGEST RECOLLECTION (A)  Final   Report Status 03/01/2020 FINAL  Final  SARS Coronavirus 2 by RT PCR (hospital order, performed in [facility] hospital lab) Nasopharyngeal Nasopharyngeal Swab     Status: None   Collection Time: 02/29/20 12:08 AM   Specimen: Nasopharyngeal Swab  Result Value Ref Range Status   SARS Coronavirus 2 NEGATIVE NEGATIVE Final    Comment: (NOTE) SARS-CoV-2 target nucleic acids are NOT DETECTED.  The SARS-CoV-2 RNA is generally detectable in upper and lower respiratory specimens during the acute phase of infection. The lowest concentration of  SARS-CoV-2 viral copies this assay can detect is 250 copies / mL. A negative result does not preclude SARS-CoV-2 infection and should not be used as the sole basis for treatment or other patient management decisions.  A negative result may occur with improper specimen collection / handling, submission of specimen other than nasopharyngeal swab, presence of viral mutation(s) within the areas targeted by this assay, and inadequate number of viral copies (<250 copies / mL). A negative result must be combined with clinical observations, patient history, and epidemiological information.  Fact Sheet for Patients:   BoilerBrush.com.cy  Fact Sheet for Healthcare Providers: https://pope.com/  This test is not yet approved or  cleared by the [place] FDA and has been authorized for detection and/or diagnosis of SARS-CoV-2 by FDA under an Emergency Use Authorization (EUA).  This EUA will remain in effect (meaning this test can be used) for the duration of the COVID-19 declaration under Section 564(b)(1) of the Act, 21 U.S.C. section 360bbb-3(b)(1), unless the authorization is terminated or revoked sooner.  Performed at [facility], [address] [place], [place] [address]   Blood culture (routine x 2)     Status: Abnormal   Collection Time: 02/29/20 12:08 AM   Specimen: BLOOD  Result Value Ref Range Status   Specimen Description   Final    BLOOD RIGHT [facility] Performed at [facility], [address] [place], [place] [address]    Special Requests   Final    BOTTLES DRAWN AEROBIC AND ANAEROBIC Blood Culture adequate volume Performed at [facility], [address], [place], [place] [address]    Culture  Setup Time   Final    GRAM POSITIVE COCCI AEROBIC BOTTLE ONLY CRITICAL VALUE NOTED.  VALUE IS CONSISTENT WITH PREVIOUSLY REPORTED AND CALLED VALUE. Performed at [facility], [address]  [place], [place] [address]    Culture (A)  Final    Marianna Fuss SEE SEPARATE REPORT FOR SUSCEPTIBILITY RESULTS Performed at [facility], [address] [place], [place] [address]    Report Status 03/20/2020 FINAL  Final  Blood culture (routine x 2)     Status: Abnormal   Collection Time: 02/29/20 12:08 AM   Specimen: BLOOD  Result Value Ref Range Status   Specimen Description   Final    BLOOD LEFT AC Performed at [facility], [address], [place], [place] [address]    Special Requests   Final    BOTTLES DRAWN AEROBIC AND ANAEROBIC Blood Culture adequate volume Performed at [facility], [address], [place], [place] [address]    Culture  Setup Time   Final    GRAM POSITIVE COCCI ANAEROBIC BOTTLE ONLY CRITICAL RESULT CALLED TO, READ BACK BY AND VERIFIED WITH: ASAJAH DUNCAN 02/29/20 AT 1702 BY ACR    Culture (A)  Final    STAPHYLOCOCCUS WARNERI THE SIGNIFICANCE OF ISOLATING THIS ORGANISM FROM A SINGLE SET OF BLOOD CULTURES WHEN MULTIPLE SETS ARE DRAWN IS UNCERTAIN. PLEASE NOTIFY THE MICROBIOLOGY DEPARTMENT WITHIN ONE WEEK IF SPECIATION AND SENSITIVITIES ARE REQUIRED. Performed at [facility], [address] [place], [place] [address]    Report Status 03/02/2020 FINAL  Final  Blood Culture ID Panel (Reflexed)     Status: Abnormal   Collection Time: 02/29/20 12:08 AM  Result Value Ref Range Status   Enterococcus faecalis NOT DETECTED NOT DETECTED Final   Enterococcus Faecium NOT DETECTED NOT DETECTED Final   Listeria monocytogenes NOT DETECTED NOT DETECTED Final   Staphylococcus species DETECTED (A) NOT DETECTED Final    Comment: CRITICAL RESULT CALLED TO, READ BACK BY AND VERIFIED WITH: ASAJAH DUNCAN 02/29/20 AT 1702 BY ACR    Staphylococcus aureus (BCID) NOT DETECTED NOT DETECTED Final   Staphylococcus epidermidis NOT DETECTED NOT DETECTED Final   Staphylococcus lugdunensis NOT DETECTED NOT DETECTED Final   Streptococcus species  NOT DETECTED NOT DETECTED Final   Streptococcus agalactiae NOT DETECTED NOT DETECTED Final   Streptococcus pneumoniae NOT DETECTED NOT DETECTED Final  Streptococcus pyogenes NOT DETECTED NOT DETECTED Final   A.calcoaceticus-baumannii NOT DETECTED NOT DETECTED Final   Bacteroides fragilis NOT DETECTED NOT DETECTED Final   Enterobacterales NOT DETECTED NOT DETECTED Final   Enterobacter cloacae complex NOT DETECTED NOT DETECTED Final   Escherichia coli NOT DETECTED NOT DETECTED Final   Klebsiella aerogenes NOT DETECTED NOT DETECTED Final   Klebsiella oxytoca NOT DETECTED NOT DETECTED Final   Klebsiella pneumoniae NOT DETECTED NOT DETECTED Final   Proteus species NOT DETECTED NOT DETECTED Final   Salmonella species NOT DETECTED NOT DETECTED Final   Serratia marcescens NOT DETECTED NOT DETECTED Final   Haemophilus influenzae NOT DETECTED NOT DETECTED Final   Neisseria meningitidis NOT DETECTED NOT DETECTED Final   Pseudomonas aeruginosa NOT DETECTED NOT DETECTED Final   Stenotrophomonas maltophilia NOT DETECTED NOT DETECTED Final   Candida albicans NOT DETECTED NOT DETECTED Final   Candida auris NOT DETECTED NOT DETECTED Final   Candida glabrata NOT DETECTED NOT DETECTED Final   Candida krusei NOT DETECTED NOT DETECTED Final   Candida parapsilosis NOT DETECTED NOT DETECTED Final   Candida tropicalis NOT DETECTED NOT DETECTED Final   Cryptococcus neoformans/gattii NOT DETECTED NOT DETECTED Final    Comment: Performed at [facility], [address] [place], [place] [address]  Susceptibility, Aer + Anaerob     Status: Abnormal   Collection Time: 02/29/20 12:08 AM  Result Value Ref Range Status   Suscept, Aer + Anaerob Final report (A)  Corrected    Comment: (NOTE) Performed At: [facility] [address] [place], [place] [id number] Jolene Schimke MD YQ:[id number] CORRECTED ON 09/27 AT 0835: PREVIOUSLY REPORTED AS Preliminary report    Source of Sample [facility] BLOOD  Final    Comment: Performed at [facility], [address], [place], [place] [address]  Susceptibility Result     Status: Abnormal   Collection Time: 02/29/20 12:08 AM  Result Value Ref Range Status   Suscept Result 1 Rothia mucilaginosa (A)  Final   Antimicrobial Suscept Comment  Corrected    Comment: (NOTE)      ** S = Susceptible; I = Intermediate; R = Resistant **                   P = Positive; N = Negative            MICS are expressed in micrograms per mL   Antibiotic                 RSLT#1    RSLT#2    RSLT#3    RSLT#4 Clindamycin                    S Erythromycin                   S Levofloxacin                   S Penicillin                     S Trimethoprim/Sulfa             S Vancomycin                     S Performed At: [facility] [address] [place], [place] [id number] Jolene Schimke MD OZ:[id number]   CULTURE, BLOOD (ROUTINE X 2) w Reflex to ID Panel     Status: None  Collection Time: 03/02/20  6:24 AM   Specimen: BLOOD  Result Value Ref Range Status   Specimen Description BLOOD RIGTH UPPER ARM  Final   Special Requests   Final    BOTTLES DRAWN AEROBIC AND ANAEROBIC Blood Culture adequate volume   Culture   Final    NO GROWTH 5 DAYS Performed at [facility], [address] [place], [place] [address]    Report Status 03/07/2020 FINAL  Final  CULTURE, BLOOD (ROUTINE X 2) w Reflex to ID Panel     Status: None   Collection Time: 03/02/20  6:34 AM   Specimen: BLOOD  Result Value Ref Range Status   Specimen Description BLOOD RIGHT HAND  Final   Special Requests   Final    BOTTLES DRAWN AEROBIC AND ANAEROBIC Blood Culture adequate volume   Culture   Final    NO GROWTH 5 DAYS Performed at [facility], [address] [place], [place] [address]    Report Status 03/07/2020 FINAL  Final    Labs: CBC: Recent Labs  Lab 02/15/23 1708 02/17/23 0352 02/18/23 0235  WBC 5.5 5.7 5.1  HGB 14.9 13.9  13.0  HCT 47.3* 43.2 39.8  MCV 92.2 88.9 89.6  PLT 202 189 191   Basic Metabolic Panel: Recent Labs  Lab 02/15/23 1708 02/16/23 0543 02/18/23 0743  NA 142 142 142  K 3.8 3.4* 4.0  CL 107 107 107  CO2 25 24 25   GLUCOSE 110* 82 86  BUN 17 14 17   CREATININE 0.86 0.72 0.77  CALCIUM 9.2 8.5* 8.7*   Liver Function Tests: Recent Labs  Lab 02/15/23 1708  AST 47*  ALT 37  ALKPHOS 88  BILITOT 1.0  PROT 6.8  ALBUMIN 3.8    Discharge time spent: greater than 30 minutes.  Signed: Enedina Finner, MD [facility] 02/18/2023

## 2023-02-18 NOTE — Progress Notes (Addendum)
[facility] CLINIC CARDIOLOGY PROGRESS NOTE   Patient ID: [name] MRN: [id number] DOB/AGE: Jan 11, 1946 77 y.o.  Admit date: 02/15/2023 Referring Physician Dr Allena Katz Primary Physician Dr Hyacinth Meeker Primary Cardiologist Dr Gwen Pounds (retired) Reason for Consultation AECHF  HPI: Patricia Miller is a 77 y.o. female  with a past medical history of breast cancer s/p BL mastectomy and 16 weeks of chemotherapy, prior PE/DVT, and diastolic heart failure who presented to the ED on 02/15/2023 for shortness of breath and edema. Patient found to have PE and was started on anticoagulation.    Interval History:  -Patient feeling much better this AM. SOB much improved.  -Diuresed well with IV lasix, renal function is stable.  -BP remains borderline. -Echo results discussed with patient.   Review of systems complete and found to be negative unless listed above    Vitals:   02/17/23 2332 02/18/23 0357 02/18/23 0518 02/18/23 0741  BP: (!) 101/48 105/80  94/60  Pulse:  72  77  Resp: 18 16  16   Temp: 97.8 F (36.6 C) 97.8 F (36.6 C)  97.8 F (36.6 C)  TempSrc:    Oral  SpO2: 96% 92%  92%  Weight:   47.8 kg   Height:         Intake/Output Summary (Last 24 hours) at 02/18/2023 1005 Last data filed at 02/17/2023 1950 Gross per 24 hour  Intake 283.76 ml  Output --  Net 283.76 ml     PHYSICAL EXAM General: alert, well nourished, in no acute distress sitting upright on side of hospital bed eating breakfast. HEENT: Normocephalic and atraumatic. Neck: No JVD.  Lungs: Normal respiratory effort. Clear bilaterally to auscultation. No wheezes, crackles, rhonchi.  Heart: HRRR. Normal S1 and S2 without gallops or murmurs. Radial & DP pulses 2+ bilaterally. Abdomen: Non-distended appearing.  Msk: Normal strength and tone for age. Extremities: No clubbing, cyanosis or edema.   Neuro: Alert and oriented X 3. Psych: Mood appropriate, affect congruent.    LABS: Basic Metabolic Panel: Recent Labs     02/16/23 0543 02/18/23 0743  NA 142 142  K 3.4* 4.0  CL 107 107  CO2 24 25  GLUCOSE 82 86  BUN 14 17  CREATININE 0.72 0.77  CALCIUM 8.5* 8.7*   Liver Function Tests: Recent Labs    02/15/23 1708  AST 47*  ALT 37  ALKPHOS 88  BILITOT 1.0  PROT 6.8  ALBUMIN 3.8   No results for input(s): "LIPASE", "AMYLASE" in the last 72 hours. CBC: Recent Labs    02/17/23 0352 02/18/23 0235  WBC 5.7 5.1  HGB 13.9 13.0  HCT 43.2 39.8  MCV 88.9 89.6  PLT 189 191   Cardiac Enzymes: No results for input(s): "CKTOTAL", "CKMB", "CKMBINDEX", "TROPONINIHS" in the last 72 hours. BNP: Recent Labs    02/15/23 1708  BNP 3,557.9*   D-Dimer: Recent Labs    02/15/23 2000  DDIMER 2.27*   Hemoglobin A1C: No results for input(s): "HGBA1C" in the last 72 hours. Fasting Lipid Panel: No results for input(s): "CHOL", "HDL", "LDLCALC", "TRIG", "CHOLHDL", "LDLDIRECT" in the last 72 hours. Thyroid Function Tests: Recent Labs    02/15/23 2000  TSH 5.669*   Anemia Panel: No results for input(s): "VITAMINB12", "FOLATE", "FERRITIN", "TIBC", "IRON", "RETICCTPCT" in the last 72 hours.  ECHOCARDIOGRAM COMPLETE  Result Date: 02/18/2023    ECHOCARDIOGRAM REPORT   Patient Name:   Patricia Miller Date of Exam: 02/16/2023 Medical Rec #:  [id number]  Height:       62.0 in Accession #:    [id number]         Weight:       112.4 lb Date of Birth:  Jun 22, 1945          BSA:          1.497 m Patient Age:    77 years           BP:           113/57 mmHg Patient Gender: F                  HR:           76 bpm. Exam Location:  [facility] Procedure: 2D Echo, Cardiac Doppler and Color Doppler Indications:     I50.21 Acute Diastolic Heart Failure  History:         Patient has prior history of Echocardiogram examinations, most                  recent 01/03/2022. Risk Factors:Hypertension and Dyslipidemia.                  Pneumonia.  Sonographer:     Sedonia Small Rodgers-Jones RDCS Referring Phys:  [id number] Rometta Emery  Diagnosing Phys: Rozell Searing Custovic IMPRESSIONS  1. Left ventricular ejection fraction, by estimation, is 25 to 30%. Left ventricular ejection fraction by PLAX is 27 %. The left ventricle has severely decreased function. The left ventricle demonstrates global hypokinesis. The left ventricular internal  cavity size was mildly dilated. Left ventricular diastolic parameters are consistent with Grade I diastolic dysfunction (impaired relaxation).  2. Right ventricular systolic function is low normal. The right ventricular size is normal.  3. A small pericardial effusion is present. There is no evidence of cardiac tamponade.  4. The mitral valve is grossly normal. Moderate mitral valve regurgitation.  5. The aortic valve is grossly normal. Aortic valve regurgitation is trivial. FINDINGS  Left Ventricle: Left ventricular ejection fraction, by estimation, is 25 to 30%. Left ventricular ejection fraction by PLAX is 27 %. The left ventricle has severely decreased function. The left ventricle demonstrates global hypokinesis. The left ventricular internal cavity size was mildly dilated. There is no left ventricular hypertrophy. Left ventricular diastolic parameters are consistent with Grade I diastolic dysfunction (impaired relaxation). Right Ventricle: The right ventricular size is normal. No increase in right ventricular wall thickness. Right ventricular systolic function is low normal. Left Atrium: Left atrial size was normal in size. Right Atrium: Right atrial size was normal in size. Pericardium: A small pericardial effusion is present. There is no evidence of cardiac tamponade. Mitral Valve: The mitral valve is grossly normal. Moderate mitral valve regurgitation. Tricuspid Valve: The tricuspid valve is grossly normal. Tricuspid valve regurgitation is mild. Aortic Valve: The aortic valve is grossly normal. Aortic valve regurgitation is trivial. Pulmonic Valve: The pulmonic valve was grossly normal. Pulmonic valve  regurgitation is not visualized. Aorta: The aortic root and ascending aorta are structurally normal, with no evidence of dilitation. IAS/Shunts: No atrial level shunt detected by color flow Doppler.  LEFT VENTRICLE PLAX 2D LV EF:         Left            Diastology                ventricular     LV e' medial:    6.31 cm/s  ejection        LV E/e' medial:  11.0                fraction by     LV e' lateral:   8.05 cm/s                PLAX is 27      LV E/e' lateral: 8.6                %. LVIDd:         4.70 cm LVIDs:         4.10 cm LV PW:         1.00 cm LV IVS:        0.80 cm LVOT diam:     1.80 cm LV SV:         24 LV SV Index:   16 LVOT Area:     2.54 cm  RIGHT VENTRICLE             IVC RV Basal diam:  3.00 cm     IVC diam: 1.80 cm RV S prime:     10.20 cm/s LEFT ATRIUM             Index        RIGHT ATRIUM           Index LA diam:        3.60 cm 2.41 cm/m   RA Area:     14.70 cm LA Vol (A2C):   56.8 ml 37.95 ml/m  RA Volume:   35.90 ml  23.99 ml/m LA Vol (A4C):   36.7 ml 24.52 ml/m LA Biplane Vol: 48.1 ml 32.14 ml/m  AORTIC VALVE LVOT Vmax:   60.13 cm/s LVOT Vmean:  36.433 cm/s LVOT VTI:    0.094 m  AORTA Ao Root diam: 3.00 cm MITRAL VALVE MV Area (PHT): 4.68 cm    SHUNTS MV Decel Time: 162 msec    Systemic VTI:  0.09 m MV E velocity: 69.45 cm/s  Systemic Diam: 1.80 cm MV A velocity: 38.00 cm/s MV E/A ratio:  1.83 Sabina Custovic Electronically signed by Clotilde Dieter Signature Date/Time: 02/18/2023/9:18:49 AM    Final      ECHO as above  TELEMETRY reviewed by me 02/18/23: NSR with PVCs, periods of bigeminy  EKG reviewed by me 02/18/23: NSR with PVCs  DATA reviewed by me 02/18/23: last 24h vitals tele labs imaging I/O primary notes    ASSESSMENT AND PLAN:  Principal Problem:   Acute combined systolic and diastolic CHF, NYHA class 1 (HCC) Active Problems:   History of squamous cell carcinoma   HTN (hypertension)   Hyperlipidemia, mixed   Alzheimer's dementia without  behavioral disturbance (HCC)   Malignant neoplasm of upper-outer quadrant of left breast in female, estrogen receptor negative (HCC)   History of pulmonary embolism (HCC)    # Right-sided PE with associated mild right heart strain and small pericardial effusion. Suspect provoked by malignancy, in addition to prior history of DVT/PE Heparin gtt > xarelto  Minimal right heart strain per CTA.   # Acute heart failure reduced EF  # Hypotension Echo this admission with newly reduced EF to 25-30%, grade I diastolic dysfunction (previously 55-60% in 12/2021). Net negative 3.2L as of  this morning.  -S/p IV lasix 40 mg bid x 3 doses. Switched to PO lasix 20 mg daily today.  -BP remains borderline, additions of GDMT limited by this. Plan to start  GDMT at close outpatient follow up appointment as BP allows.  # Alzheimer's dementia Continue with home regimen.   # Hyperlipidemia Continue statin  Patient is stable for discharge today from a cardiac perspective with close follow up in 1 week with Dr. Melton Alar, plan to start GDMT at that time if BP will tolerate.  The patient's plan of care was discussed and created with Dr. Melton Alar and she is in agreement.   Signed:  Gale Journey, PA-C 02/18/2023, 10:05 AM [facility] Cardiology

## 2023-02-18 NOTE — Consult Note (Signed)
[facility] Customer service manager [facility]) Accountable Care Organization (ACO) [facility] Liaison Note  02/18/2023  EDWARDA SAUNDERS [birth year] [id number]  Location: [facility] RN Hospital Liaison screened the patient remotely at [facility].  Insurance: [employer]   ADELMIRA MCKINSEY is a 77 y.o. female who is a Primary Care Patient of Danella Penton, MD [facility]). The patient was screened for readmission hospitalization with noted medium risk score for unplanned readmission risk with 1 IP in 6 months.  The patient was assessed for potential [facility]) Care Management service needs for post hospital transition for care coordination. Review of patient's electronic medical record reveals patient was admitted CHF. Pt is with [facility]. Hospital liaison attempted outreach call to [facility] spoke with Davona concerning pt's discharged from the hospital today. No further interventions on behalf of the [facility]BCI Liaison.   [facility] Care Management/Population Health does not replace or interfere with any arrangements made by the Inpatient Transition of Care team.   For questions contact:   Elliot Cousin, RN, [facility] Liaison [place]   Population Health Office Hours MTWF  8:00 am-6:00 pm [phone] mobile [phone] [Office toll free line] Office Hours are M-F 8:30 - 5 pm [email or web address]

## 2023-02-18 NOTE — Progress Notes (Signed)
Spoke to [place] at [facility] who told me where to send Patricia Miller's prescriptions to. Changed it in the system to [facility].

## 2023-02-18 NOTE — TOC Transition Note (Signed)
Transition of Care [facility]) - CM/SW Discharge Note   Patient Details  Name: Patricia Miller MRN: [id number] Date of Birth: Jun 27, 1945  Transition of Care [facility] - ) CM/SW Contact:  [name], RN Phone Number: 02/18/2023, 10:05 AM   Clinical Narrative:    Robet Leu from [facility] regarding patient's return to facility today. She stated she would have to contact Geraldine from memory care. Misty Stanley was advised patient has recommendations for HHPT, BCS, and RW. After Misty Stanley has a update she or Waynetta Sandy will contact this RNCM.          Patient Goals and CMS Choice      Discharge Placement                         Discharge Plan and Services Additional resources added to the After Visit Summary for                                       Social Determinants of Health (SDOH) Interventions SDOH Screenings   Food Insecurity: No Food Insecurity (02/16/2023)  Housing: Low Risk  (02/16/2023)  Transportation Needs: No Transportation Needs (02/16/2023)  Utilities: Not At Risk (02/16/2023)  Tobacco Use: Medium Risk (02/15/2023)     Readmission Risk Interventions     No data to display

## 2023-02-18 NOTE — Progress Notes (Addendum)
Patient is not able to walk the distance required to go the bathroom, or he/she is unable to safely negotiate stairs required to access the bathroom.  A 3in1  will alleviate this problem

## 2023-02-18 NOTE — TOC Benefit Eligibility Note (Signed)
Patient Product/process development scientist completed.    The patient is insured through [employer]/ [employer]. Patient has Medicare and is not eligible for a copay card, but may be able to apply for patient assistance, if available.    Ran test claim for Xarelto Starter Pack and the current 30 day co-pay is $47.00.   This test claim was processed through [facility]- copay amounts may vary at other pharmacies due to pharmacy/plan contracts, or as the patient moves through the different stages of their insurance plan.     Roland Earl, CPHT Pharmacy Technician III Certified Patient Advocate [facility] Patient Advocate Team Direct Number: [phone]  Fax: [phone]

## 2023-02-18 NOTE — NC FL2 (Signed)
[place] MEDICAID FL2 LEVEL OF CARE FORM     IDENTIFICATION  Patient Name: KAJAL BONICA Birthdate: 12-30-1945 Sex: female Admission Date (Current Location): 02/15/2023  [facility] and [place] Number:  Chiropodist and Address:  [facility], [address] [place], [place] [address]      Provider Number: [id number]  Attending Physician Name and Address:  No att. providers found  Relative Name and Phone Number:  [name]    Current Level of Care: Hospital Recommended Level of Care: Memory Care  Prior Approval Number:    Date Approved/Denied:   PASRR Number:    Discharge Plan: Memory Care     Current Diagnoses: Patient Active Problem List   Diagnosis Date Noted   History of pulmonary embolism (HCC) 02/15/2023   Acute combined systolic and diastolic CHF, NYHA class 1 (HCC) 02/15/2023   Breast cancer (HCC) 07/18/2022   Anemia due to antineoplastic chemotherapy 06/12/2022   Malignant neoplasm of upper-outer quadrant of left breast in female, estrogen receptor negative (HCC) 12/29/2021   Cancer (HCC) 12/22/2021   Premature ventricular contractions 12/05/2020   Bronchiectasis without complication (HCC) 07/26/2020   Alzheimer's dementia without behavioral disturbance (HCC) 06/29/2020   Memory loss    Acute blood loss anemia    Acute pulmonary embolism with acute cor pulmonale (HCC) 02/29/2020   Acute deep vein thrombosis (DVT) of femoral vein of left lower extremity (HCC) 02/29/2020   RLL pneumonia 02/29/2020   Pulmonary nodule 02/29/2020   Hormone replacement therapy (HRT) 02/29/2020   S/P laparoscopic hysterectomy 12/2019 02/29/2020   HTN (hypertension) 02/29/2020   Hypokalemia    Hyperlipidemia, mixed 12/15/2019   History of squamous cell carcinoma 02/03/2018   Medicare annual wellness visit, initial 02/03/2018    Hx of Tubular adenoma 11/29/2016   Hormone replacement therapy (postmenopausal) 03/22/2015   A-V fistula (HCC)  11/08/2013    Orientation RESPIRATION BLADDER Height & Weight     Self, Place  Normal Continent Weight: 47.8 kg Height:  5\' 2"  (157.5 cm)  BEHAVIORAL SYMPTOMS/MOOD NEUROLOGICAL BOWEL NUTRITION STATUS  Other (Comment) (n/a)  (n/a) Continent Diet (Heart)  AMBULATORY STATUS COMMUNICATION OF NEEDS Skin   Limited Assist Verbally Normal                       Personal Care Assistance Level of Assistance  Bathing, Dressing Bathing Assistance: Limited assistance   Dressing Assistance: Limited assistance     Functional Limitations Info             SPECIAL CARE FACTORS FREQUENCY  PT (By licensed PT)     PT Frequency: Min 2xweekly              Contractures Contractures Info: Not present    Additional Factors Info  Code Status Code Status Info: DNR-Limited             Current Medications (02/18/2023):  This is the current hospital active medication list Current Facility-Administered Medications  Medication Dose Route Frequency Provider Last Rate Last Admin   0.9 %  sodium chloride infusion  250 mL Intravenous PRN Rometta Emery, MD       acetaminophen (TYLENOL) tablet 650 mg  650 mg Oral Q4H PRN Rometta Emery, MD   650 mg at 02/16/23 2204   clonazePAM (KLONOPIN) tablet 0.5 mg  0.5 mg Oral Q12H PRN Enedina Finner, MD   0.5 mg at 02/17/23 2105   furosemide (LASIX) tablet 20 mg  20 mg Oral Daily Enedina Finner, MD   20 mg at 02/18/23 0926   OLANZapine (ZYPREXA) tablet 5 mg  5 mg Oral QHS Enedina Finner, MD   5 mg at 02/17/23 2105   ondansetron (ZOFRAN) injection 4 mg  4 mg Intravenous Q6H PRN Rometta Emery, MD       polyethylene glycol (MIRALAX / GLYCOLAX) packet 17 g  17 g Oral Q M,W,F Drusilla Kanner, RPH   17 g at 02/18/23 5784   Rivaroxaban (XARELTO) tablet 15 mg  15 mg Oral BID WC Tressie Ellis, RPH   15 mg at 02/18/23 6962   Followed by   Melene Muller ON 03/10/2023] rivaroxaban (XARELTO) tablet 20 mg  20 mg Oral Q supper Tressie Ellis, RPH       sertraline  (ZOLOFT) tablet 25 mg  25 mg Oral Daily Enedina Finner, MD   25 mg at 02/18/23 0926   sertraline (ZOLOFT) tablet 50 mg  50 mg Oral Daily Enedina Finner, MD   50 mg at 02/18/23 0934   sodium chloride flush (NS) 0.9 % injection 3 mL  3 mL Intravenous Q12H Earlie Lou L, MD   3 mL at 02/17/23 2107   sodium chloride flush (NS) 0.9 % injection 3 mL  3 mL Intravenous PRN Rometta Emery, MD       Current Outpatient Medications  Medication Sig Dispense Refill   clonazePAM (KLONOPIN) 0.5 MG tablet Take 1 tablet by mouth every 12 (twelve) hours as needed (for agitation).     MIRALAX 17 GM/SCOOP powder Take 17 g by mouth every Monday, Wednesday, and Friday.     OLANZapine (ZYPREXA) 5 MG tablet Take 5 mg by mouth at bedtime.     RIVAROXABAN (XARELTO) VTE STARTER PACK (15 & 20 MG) Follow package directions: Take one 15mg  tablet by mouth twice a day. On day 22, switch to one 20mg  tablet once a day. Take with food. 51 each 0   sertraline (ZOLOFT) 25 MG tablet Take 25 mg by mouth daily. In addition to 50 mg for a total daily dose of 75 mg     sertraline (ZOLOFT) 50 MG tablet Take 50 mg by mouth daily.     [START ON 02/19/2023] furosemide (LASIX) 20 MG tablet Take 1 tablet (20 mg total) by mouth daily. 30 tablet 1     Discharge Medications: Please see discharge summary for a list of discharge medications.  Relevant Imaging Results:  Relevant Lab Results:   Additional Information SSN# [id number]  [name], RN

## 2023-02-20 DIAGNOSIS — F0284 Dementia in other diseases classified elsewhere, unspecified severity, with anxiety: Secondary | ICD-10-CM | POA: Diagnosis not present

## 2023-02-20 DIAGNOSIS — Z7901 Long term (current) use of anticoagulants: Secondary | ICD-10-CM | POA: Diagnosis not present

## 2023-02-20 DIAGNOSIS — C50112 Malignant neoplasm of central portion of left female breast: Secondary | ICD-10-CM | POA: Diagnosis not present

## 2023-02-20 DIAGNOSIS — Z90710 Acquired absence of both cervix and uterus: Secondary | ICD-10-CM | POA: Diagnosis not present

## 2023-02-20 DIAGNOSIS — I5040 Unspecified combined systolic (congestive) and diastolic (congestive) heart failure: Secondary | ICD-10-CM | POA: Diagnosis not present

## 2023-02-20 DIAGNOSIS — D630 Anemia in neoplastic disease: Secondary | ICD-10-CM | POA: Diagnosis not present

## 2023-02-20 DIAGNOSIS — Z9181 History of falling: Secondary | ICD-10-CM | POA: Diagnosis not present

## 2023-02-20 DIAGNOSIS — Z87891 Personal history of nicotine dependence: Secondary | ICD-10-CM | POA: Diagnosis not present

## 2023-02-20 DIAGNOSIS — Z86711 Personal history of pulmonary embolism: Secondary | ICD-10-CM | POA: Diagnosis not present

## 2023-02-20 DIAGNOSIS — E782 Mixed hyperlipidemia: Secondary | ICD-10-CM | POA: Diagnosis not present

## 2023-02-20 DIAGNOSIS — Z9089 Acquired absence of other organs: Secondary | ICD-10-CM | POA: Diagnosis not present

## 2023-02-20 DIAGNOSIS — Z9013 Acquired absence of bilateral breasts and nipples: Secondary | ICD-10-CM | POA: Diagnosis not present

## 2023-02-20 DIAGNOSIS — G300 Alzheimer's disease with early onset: Secondary | ICD-10-CM | POA: Diagnosis not present

## 2023-02-20 DIAGNOSIS — Z9851 Tubal ligation status: Secondary | ICD-10-CM | POA: Diagnosis not present

## 2023-02-20 DIAGNOSIS — Z8701 Personal history of pneumonia (recurrent): Secondary | ICD-10-CM | POA: Diagnosis not present

## 2023-02-20 DIAGNOSIS — M1990 Unspecified osteoarthritis, unspecified site: Secondary | ICD-10-CM | POA: Diagnosis not present

## 2023-02-20 DIAGNOSIS — Z5982 Transportation insecurity: Secondary | ICD-10-CM | POA: Diagnosis not present

## 2023-02-20 DIAGNOSIS — I110 Hypertensive heart disease with heart failure: Secondary | ICD-10-CM | POA: Diagnosis not present

## 2023-02-20 DIAGNOSIS — F0283 Dementia in other diseases classified elsewhere, unspecified severity, with mood disturbance: Secondary | ICD-10-CM | POA: Diagnosis not present

## 2023-02-20 DIAGNOSIS — Z85828 Personal history of other malignant neoplasm of skin: Secondary | ICD-10-CM | POA: Diagnosis not present

## 2023-02-20 DIAGNOSIS — Z86718 Personal history of other venous thrombosis and embolism: Secondary | ICD-10-CM | POA: Diagnosis not present

## 2023-02-25 DIAGNOSIS — G301 Alzheimer's disease with late onset: Secondary | ICD-10-CM | POA: Diagnosis not present

## 2023-02-25 DIAGNOSIS — I2699 Other pulmonary embolism without acute cor pulmonale: Secondary | ICD-10-CM | POA: Diagnosis not present

## 2023-02-25 DIAGNOSIS — I493 Ventricular premature depolarization: Secondary | ICD-10-CM | POA: Diagnosis not present

## 2023-02-25 DIAGNOSIS — F411 Generalized anxiety disorder: Secondary | ICD-10-CM | POA: Diagnosis not present

## 2023-02-25 DIAGNOSIS — I5020 Unspecified systolic (congestive) heart failure: Secondary | ICD-10-CM | POA: Diagnosis not present

## 2023-02-25 DIAGNOSIS — F02C0 Dementia in other diseases classified elsewhere, severe, without behavioral disturbance, psychotic disturbance, mood disturbance, and anxiety: Secondary | ICD-10-CM | POA: Diagnosis not present

## 2023-02-25 DIAGNOSIS — I82412 Acute embolism and thrombosis of left femoral vein: Secondary | ICD-10-CM | POA: Diagnosis not present

## 2023-02-25 DIAGNOSIS — F331 Major depressive disorder, recurrent, moderate: Secondary | ICD-10-CM | POA: Diagnosis not present

## 2023-02-27 DIAGNOSIS — I509 Heart failure, unspecified: Secondary | ICD-10-CM | POA: Diagnosis not present

## 2023-03-04 DIAGNOSIS — I5040 Unspecified combined systolic (congestive) and diastolic (congestive) heart failure: Secondary | ICD-10-CM | POA: Diagnosis not present

## 2023-03-04 DIAGNOSIS — C50112 Malignant neoplasm of central portion of left female breast: Secondary | ICD-10-CM | POA: Diagnosis not present

## 2023-03-04 DIAGNOSIS — I110 Hypertensive heart disease with heart failure: Secondary | ICD-10-CM | POA: Diagnosis not present

## 2023-03-07 DIAGNOSIS — N182 Chronic kidney disease, stage 2 (mild): Secondary | ICD-10-CM | POA: Diagnosis not present

## 2023-03-07 DIAGNOSIS — F419 Anxiety disorder, unspecified: Secondary | ICD-10-CM | POA: Diagnosis not present

## 2023-03-07 DIAGNOSIS — F330 Major depressive disorder, recurrent, mild: Secondary | ICD-10-CM | POA: Diagnosis not present

## 2023-03-07 DIAGNOSIS — R109 Unspecified abdominal pain: Secondary | ICD-10-CM | POA: Diagnosis not present

## 2023-03-07 DIAGNOSIS — F4321 Adjustment disorder with depressed mood: Secondary | ICD-10-CM | POA: Diagnosis not present

## 2023-03-07 DIAGNOSIS — I5022 Chronic systolic (congestive) heart failure: Secondary | ICD-10-CM | POA: Diagnosis not present

## 2023-03-08 DIAGNOSIS — I110 Hypertensive heart disease with heart failure: Secondary | ICD-10-CM | POA: Diagnosis not present

## 2023-03-08 DIAGNOSIS — I10 Essential (primary) hypertension: Secondary | ICD-10-CM | POA: Diagnosis not present

## 2023-03-08 DIAGNOSIS — E785 Hyperlipidemia, unspecified: Secondary | ICD-10-CM | POA: Diagnosis not present

## 2023-03-08 DIAGNOSIS — Z86718 Personal history of other venous thrombosis and embolism: Secondary | ICD-10-CM | POA: Diagnosis not present

## 2023-03-08 DIAGNOSIS — Q282 Arteriovenous malformation of cerebral vessels: Secondary | ICD-10-CM | POA: Diagnosis not present

## 2023-03-08 DIAGNOSIS — I5022 Chronic systolic (congestive) heart failure: Secondary | ICD-10-CM | POA: Diagnosis not present

## 2023-03-11 DIAGNOSIS — I509 Heart failure, unspecified: Secondary | ICD-10-CM | POA: Diagnosis not present

## 2023-03-14 DIAGNOSIS — K5900 Constipation, unspecified: Secondary | ICD-10-CM | POA: Diagnosis not present

## 2023-03-14 DIAGNOSIS — I509 Heart failure, unspecified: Secondary | ICD-10-CM | POA: Diagnosis not present

## 2023-03-14 DIAGNOSIS — I2609 Other pulmonary embolism with acute cor pulmonale: Secondary | ICD-10-CM | POA: Diagnosis not present

## 2023-03-14 DIAGNOSIS — R451 Restlessness and agitation: Secondary | ICD-10-CM | POA: Diagnosis not present

## 2023-03-14 DIAGNOSIS — R609 Edema, unspecified: Secondary | ICD-10-CM | POA: Diagnosis not present

## 2023-03-19 DIAGNOSIS — I509 Heart failure, unspecified: Secondary | ICD-10-CM | POA: Diagnosis not present

## 2023-03-21 DIAGNOSIS — I509 Heart failure, unspecified: Secondary | ICD-10-CM | POA: Diagnosis not present

## 2023-03-21 DIAGNOSIS — I2609 Other pulmonary embolism with acute cor pulmonale: Secondary | ICD-10-CM | POA: Diagnosis not present

## 2023-03-21 DIAGNOSIS — R609 Edema, unspecified: Secondary | ICD-10-CM | POA: Diagnosis not present

## 2023-03-26 DIAGNOSIS — F331 Major depressive disorder, recurrent, moderate: Secondary | ICD-10-CM | POA: Diagnosis not present

## 2023-03-26 DIAGNOSIS — F419 Anxiety disorder, unspecified: Secondary | ICD-10-CM | POA: Diagnosis not present

## 2023-03-26 DIAGNOSIS — G4700 Insomnia, unspecified: Secondary | ICD-10-CM | POA: Diagnosis not present

## 2023-03-26 DIAGNOSIS — G309 Alzheimer's disease, unspecified: Secondary | ICD-10-CM | POA: Diagnosis not present

## 2023-04-04 DIAGNOSIS — C50112 Malignant neoplasm of central portion of left female breast: Secondary | ICD-10-CM | POA: Diagnosis not present

## 2023-04-04 DIAGNOSIS — C50919 Malignant neoplasm of unspecified site of unspecified female breast: Secondary | ICD-10-CM | POA: Diagnosis not present

## 2023-04-04 DIAGNOSIS — G309 Alzheimer's disease, unspecified: Secondary | ICD-10-CM | POA: Diagnosis not present

## 2023-04-04 DIAGNOSIS — I509 Heart failure, unspecified: Secondary | ICD-10-CM | POA: Diagnosis not present

## 2023-04-04 DIAGNOSIS — R21 Rash and other nonspecific skin eruption: Secondary | ICD-10-CM | POA: Diagnosis not present

## 2023-04-04 DIAGNOSIS — I110 Hypertensive heart disease with heart failure: Secondary | ICD-10-CM | POA: Diagnosis not present

## 2023-04-04 DIAGNOSIS — I5030 Unspecified diastolic (congestive) heart failure: Secondary | ICD-10-CM | POA: Diagnosis not present

## 2023-04-04 DIAGNOSIS — N179 Acute kidney failure, unspecified: Secondary | ICD-10-CM | POA: Diagnosis not present

## 2023-04-04 DIAGNOSIS — R269 Unspecified abnormalities of gait and mobility: Secondary | ICD-10-CM | POA: Diagnosis not present

## 2023-04-04 DIAGNOSIS — Z515 Encounter for palliative care: Secondary | ICD-10-CM | POA: Diagnosis not present

## 2023-04-08 DIAGNOSIS — F330 Major depressive disorder, recurrent, mild: Secondary | ICD-10-CM | POA: Diagnosis not present

## 2023-04-08 DIAGNOSIS — N179 Acute kidney failure, unspecified: Secondary | ICD-10-CM | POA: Diagnosis not present

## 2023-04-11 DIAGNOSIS — L821 Other seborrheic keratosis: Secondary | ICD-10-CM | POA: Diagnosis not present

## 2023-04-11 DIAGNOSIS — L570 Actinic keratosis: Secondary | ICD-10-CM | POA: Diagnosis not present

## 2023-04-11 DIAGNOSIS — Z08 Encounter for follow-up examination after completed treatment for malignant neoplasm: Secondary | ICD-10-CM | POA: Diagnosis not present

## 2023-04-11 DIAGNOSIS — Z85828 Personal history of other malignant neoplasm of skin: Secondary | ICD-10-CM | POA: Diagnosis not present

## 2023-04-11 DIAGNOSIS — D225 Melanocytic nevi of trunk: Secondary | ICD-10-CM | POA: Diagnosis not present

## 2023-04-15 DIAGNOSIS — F331 Major depressive disorder, recurrent, moderate: Secondary | ICD-10-CM | POA: Diagnosis not present

## 2023-04-15 DIAGNOSIS — F411 Generalized anxiety disorder: Secondary | ICD-10-CM | POA: Diagnosis not present

## 2023-04-15 DIAGNOSIS — C50919 Malignant neoplasm of unspecified site of unspecified female breast: Secondary | ICD-10-CM | POA: Diagnosis not present

## 2023-04-15 DIAGNOSIS — F0390 Unspecified dementia without behavioral disturbance: Secondary | ICD-10-CM | POA: Diagnosis not present

## 2023-04-15 DIAGNOSIS — F5105 Insomnia due to other mental disorder: Secondary | ICD-10-CM | POA: Diagnosis not present

## 2023-04-15 DIAGNOSIS — I5030 Unspecified diastolic (congestive) heart failure: Secondary | ICD-10-CM | POA: Diagnosis not present

## 2023-04-15 DIAGNOSIS — Z515 Encounter for palliative care: Secondary | ICD-10-CM | POA: Diagnosis not present

## 2023-04-15 DIAGNOSIS — G309 Alzheimer's disease, unspecified: Secondary | ICD-10-CM | POA: Diagnosis not present

## 2023-04-18 DIAGNOSIS — I509 Heart failure, unspecified: Secondary | ICD-10-CM | POA: Diagnosis not present

## 2023-04-18 DIAGNOSIS — R21 Rash and other nonspecific skin eruption: Secondary | ICD-10-CM | POA: Diagnosis not present

## 2023-04-18 DIAGNOSIS — R609 Edema, unspecified: Secondary | ICD-10-CM | POA: Diagnosis not present

## 2023-04-25 DIAGNOSIS — I5022 Chronic systolic (congestive) heart failure: Secondary | ICD-10-CM | POA: Diagnosis not present

## 2023-04-25 DIAGNOSIS — L989 Disorder of the skin and subcutaneous tissue, unspecified: Secondary | ICD-10-CM | POA: Diagnosis not present

## 2023-04-25 DIAGNOSIS — I110 Hypertensive heart disease with heart failure: Secondary | ICD-10-CM | POA: Diagnosis not present

## 2023-04-30 DIAGNOSIS — F419 Anxiety disorder, unspecified: Secondary | ICD-10-CM | POA: Diagnosis not present

## 2023-04-30 DIAGNOSIS — G301 Alzheimer's disease with late onset: Secondary | ICD-10-CM | POA: Diagnosis not present

## 2023-05-02 DIAGNOSIS — R21 Rash and other nonspecific skin eruption: Secondary | ICD-10-CM | POA: Diagnosis not present

## 2023-05-02 DIAGNOSIS — I110 Hypertensive heart disease with heart failure: Secondary | ICD-10-CM | POA: Diagnosis not present

## 2023-05-02 DIAGNOSIS — I5022 Chronic systolic (congestive) heart failure: Secondary | ICD-10-CM | POA: Diagnosis not present

## 2023-05-02 DIAGNOSIS — F02B4 Dementia in other diseases classified elsewhere, moderate, with anxiety: Secondary | ICD-10-CM | POA: Diagnosis not present

## 2023-05-02 DIAGNOSIS — G309 Alzheimer's disease, unspecified: Secondary | ICD-10-CM | POA: Diagnosis not present

## 2023-05-08 DIAGNOSIS — N179 Acute kidney failure, unspecified: Secondary | ICD-10-CM | POA: Diagnosis not present

## 2023-05-08 DIAGNOSIS — I509 Heart failure, unspecified: Secondary | ICD-10-CM | POA: Diagnosis not present

## 2023-05-10 DIAGNOSIS — R600 Localized edema: Secondary | ICD-10-CM | POA: Diagnosis not present

## 2023-05-14 DIAGNOSIS — R9431 Abnormal electrocardiogram [ECG] [EKG]: Secondary | ICD-10-CM | POA: Diagnosis not present

## 2023-05-14 DIAGNOSIS — I499 Cardiac arrhythmia, unspecified: Secondary | ICD-10-CM | POA: Diagnosis not present

## 2023-05-14 DIAGNOSIS — I5030 Unspecified diastolic (congestive) heart failure: Secondary | ICD-10-CM | POA: Diagnosis not present

## 2023-05-14 DIAGNOSIS — G309 Alzheimer's disease, unspecified: Secondary | ICD-10-CM | POA: Diagnosis not present

## 2023-05-14 DIAGNOSIS — I10 Essential (primary) hypertension: Secondary | ICD-10-CM | POA: Diagnosis not present

## 2023-05-14 DIAGNOSIS — C50919 Malignant neoplasm of unspecified site of unspecified female breast: Secondary | ICD-10-CM | POA: Diagnosis not present

## 2023-05-14 DIAGNOSIS — I509 Heart failure, unspecified: Secondary | ICD-10-CM | POA: Diagnosis not present

## 2023-05-14 DIAGNOSIS — Z515 Encounter for palliative care: Secondary | ICD-10-CM | POA: Diagnosis not present

## 2023-05-16 DIAGNOSIS — I5022 Chronic systolic (congestive) heart failure: Secondary | ICD-10-CM | POA: Diagnosis not present

## 2023-05-16 DIAGNOSIS — R635 Abnormal weight gain: Secondary | ICD-10-CM | POA: Diagnosis not present

## 2023-05-16 DIAGNOSIS — I110 Hypertensive heart disease with heart failure: Secondary | ICD-10-CM | POA: Diagnosis not present

## 2023-05-20 DIAGNOSIS — G309 Alzheimer's disease, unspecified: Secondary | ICD-10-CM | POA: Diagnosis not present

## 2023-05-20 DIAGNOSIS — F0390 Unspecified dementia without behavioral disturbance: Secondary | ICD-10-CM | POA: Diagnosis not present

## 2023-05-20 DIAGNOSIS — F5105 Insomnia due to other mental disorder: Secondary | ICD-10-CM | POA: Diagnosis not present

## 2023-05-20 DIAGNOSIS — F411 Generalized anxiety disorder: Secondary | ICD-10-CM | POA: Diagnosis not present

## 2023-05-20 DIAGNOSIS — F331 Major depressive disorder, recurrent, moderate: Secondary | ICD-10-CM | POA: Diagnosis not present

## 2023-05-23 DIAGNOSIS — I5022 Chronic systolic (congestive) heart failure: Secondary | ICD-10-CM | POA: Diagnosis not present

## 2023-05-23 DIAGNOSIS — I509 Heart failure, unspecified: Secondary | ICD-10-CM | POA: Diagnosis not present

## 2023-05-23 DIAGNOSIS — I517 Cardiomegaly: Secondary | ICD-10-CM | POA: Diagnosis not present

## 2023-05-23 DIAGNOSIS — L03213 Periorbital cellulitis: Secondary | ICD-10-CM | POA: Diagnosis not present

## 2023-05-23 DIAGNOSIS — J90 Pleural effusion, not elsewhere classified: Secondary | ICD-10-CM | POA: Diagnosis not present

## 2023-05-23 DIAGNOSIS — H00016 Hordeolum externum left eye, unspecified eyelid: Secondary | ICD-10-CM | POA: Diagnosis not present

## 2023-05-27 ENCOUNTER — Other Ambulatory Visit: Payer: Self-pay

## 2023-05-27 ENCOUNTER — Encounter: Payer: Self-pay | Admitting: Emergency Medicine

## 2023-05-27 ENCOUNTER — Emergency Department: Payer: PPO

## 2023-05-27 ENCOUNTER — Emergency Department
Admission: EM | Admit: 2023-05-27 | Discharge: 2023-05-27 | Discharge status: Against Medical Advice | Payer: PPO | Attending: Student | Admitting: Student

## 2023-05-27 DIAGNOSIS — Z5321 Procedure and treatment not carried out due to patient leaving prior to being seen by health care provider: Secondary | ICD-10-CM | POA: Insufficient documentation

## 2023-05-27 DIAGNOSIS — J90 Pleural effusion, not elsewhere classified: Secondary | ICD-10-CM | POA: Diagnosis not present

## 2023-05-27 DIAGNOSIS — I517 Cardiomegaly: Secondary | ICD-10-CM | POA: Diagnosis not present

## 2023-05-27 DIAGNOSIS — I509 Heart failure, unspecified: Secondary | ICD-10-CM | POA: Insufficient documentation

## 2023-05-27 DIAGNOSIS — R079 Chest pain, unspecified: Secondary | ICD-10-CM | POA: Diagnosis not present

## 2023-05-27 DIAGNOSIS — J9811 Atelectasis: Secondary | ICD-10-CM | POA: Diagnosis not present

## 2023-05-27 DIAGNOSIS — R0789 Other chest pain: Secondary | ICD-10-CM | POA: Diagnosis not present

## 2023-05-27 LAB — CBC WITH DIFFERENTIAL/PLATELET
Abs Immature Granulocytes: 0.02 10*3/uL (ref 0.00–0.07)
Basophils Absolute: 0.1 10*3/uL (ref 0.0–0.1)
Basophils Relative: 1 %
Eosinophils Absolute: 0.2 10*3/uL (ref 0.0–0.5)
Eosinophils Relative: 2 %
HCT: 52.7 % — ABNORMAL HIGH (ref 36.0–46.0)
Hemoglobin: 17.2 g/dL — ABNORMAL HIGH (ref 12.0–15.0)
Immature Granulocytes: 0 %
Lymphocytes Relative: 23 %
Lymphs Abs: 1.6 10*3/uL (ref 0.7–4.0)
MCH: 30.1 pg (ref 26.0–34.0)
MCHC: 32.6 g/dL (ref 30.0–36.0)
MCV: 92.3 fL (ref 80.0–100.0)
Monocytes Absolute: 0.5 10*3/uL (ref 0.1–1.0)
Monocytes Relative: 7 %
Neutro Abs: 4.7 10*3/uL (ref 1.7–7.7)
Neutrophils Relative %: 67 %
Platelets: 193 10*3/uL (ref 150–400)
RBC: 5.71 MIL/uL — ABNORMAL HIGH (ref 3.87–5.11)
RDW: 16.8 % — ABNORMAL HIGH (ref 11.5–15.5)
WBC: 7 10*3/uL (ref 4.0–10.5)
nRBC: 0 % (ref 0.0–0.2)

## 2023-05-27 LAB — BASIC METABOLIC PANEL
Anion gap: 13 (ref 5–15)
BUN: 31 mg/dL — ABNORMAL HIGH (ref 8–23)
CO2: 27 mmol/L (ref 22–32)
Calcium: 9 mg/dL (ref 8.9–10.3)
Chloride: 99 mmol/L (ref 98–111)
Creatinine, Ser: 0.99 mg/dL (ref 0.44–1.00)
GFR, Estimated: 58 mL/min — ABNORMAL LOW (ref >60)
Glucose, Bld: 80 mg/dL (ref 70–99)
Potassium: 3.7 mmol/L (ref 3.5–5.1)
Sodium: 139 mmol/L (ref 135–145)

## 2023-05-27 LAB — TROPONIN I (HIGH SENSITIVITY)
Troponin I (High Sensitivity): 25 ng/L — ABNORMAL HIGH (ref <18)
Troponin I (High Sensitivity): 23 ng/L — ABNORMAL HIGH (ref <18)

## 2023-05-27 LAB — BRAIN NATRIURETIC PEPTIDE: B Natriuretic Peptide: 2328.1 pg/mL — ABNORMAL HIGH (ref 0.0–100.0)

## 2023-05-27 NOTE — ED Notes (Signed)
No answer when called several times from lobby 

## 2023-05-27 NOTE — ED Triage Notes (Signed)
Patient to ED via POV from [facility] with family for CP. C/o intermittent centralized CP that started this AM.

## 2023-05-27 NOTE — ED Provider Triage Note (Signed)
Emergency Medicine Provider Triage Evaluation Note  Patricia Miller , a 77 y.o. female  was evaluated in triage.  Pt complains of chest pain that is intermittent, began this morning.   PMH of heart failure.   Review of Systems  Positive: Cp over the sternum Negative:   Physical Exam  There were no vitals taken for this visit. Gen:   Awake, no distress   Resp:  Normal effort  MSK:   Moves extremities without difficulty  Other:    Medical Decision Making  Medically screening exam initiated at 12:47 PM.  Appropriate orders placed.  Jones Skene Stammer was informed that the remainder of the evaluation will be completed by another provider, this initial triage assessment does not replace that evaluation, and the importance of remaining in the ED until their evaluation is complete.     Cameron Ali, PA-C 05/27/23 1250

## 2023-05-28 ENCOUNTER — Other Ambulatory Visit: Payer: Self-pay

## 2023-05-28 DIAGNOSIS — G301 Alzheimer's disease with late onset: Secondary | ICD-10-CM | POA: Diagnosis not present

## 2023-05-28 DIAGNOSIS — F419 Anxiety disorder, unspecified: Secondary | ICD-10-CM | POA: Diagnosis not present

## 2023-05-29 DIAGNOSIS — I493 Ventricular premature depolarization: Secondary | ICD-10-CM | POA: Diagnosis not present

## 2023-05-29 DIAGNOSIS — I2699 Other pulmonary embolism without acute cor pulmonale: Secondary | ICD-10-CM | POA: Diagnosis not present

## 2023-05-29 DIAGNOSIS — I82412 Acute embolism and thrombosis of left femoral vein: Secondary | ICD-10-CM | POA: Diagnosis not present

## 2023-05-29 DIAGNOSIS — I5020 Unspecified systolic (congestive) heart failure: Secondary | ICD-10-CM | POA: Diagnosis not present

## 2023-05-30 DIAGNOSIS — L03213 Periorbital cellulitis: Secondary | ICD-10-CM | POA: Diagnosis not present

## 2023-05-30 DIAGNOSIS — R9431 Abnormal electrocardiogram [ECG] [EKG]: Secondary | ICD-10-CM | POA: Diagnosis not present

## 2023-05-30 DIAGNOSIS — I130 Hypertensive heart and chronic kidney disease with heart failure and stage 1 through stage 4 chronic kidney disease, or unspecified chronic kidney disease: Secondary | ICD-10-CM | POA: Diagnosis not present

## 2023-05-30 DIAGNOSIS — I5022 Chronic systolic (congestive) heart failure: Secondary | ICD-10-CM | POA: Diagnosis not present

## 2023-05-30 DIAGNOSIS — N183 Chronic kidney disease, stage 3 unspecified: Secondary | ICD-10-CM | POA: Diagnosis not present

## 2023-06-06 DIAGNOSIS — I10 Essential (primary) hypertension: Secondary | ICD-10-CM | POA: Diagnosis not present

## 2023-06-11 DIAGNOSIS — I10 Essential (primary) hypertension: Secondary | ICD-10-CM | POA: Diagnosis not present

## 2023-06-17 DIAGNOSIS — I509 Heart failure, unspecified: Secondary | ICD-10-CM | POA: Diagnosis not present

## 2023-06-28 DIAGNOSIS — R001 Bradycardia, unspecified: Secondary | ICD-10-CM | POA: Diagnosis not present

## 2023-07-04 DIAGNOSIS — B351 Tinea unguium: Secondary | ICD-10-CM | POA: Diagnosis not present

## 2023-07-04 DIAGNOSIS — I7091 Generalized atherosclerosis: Secondary | ICD-10-CM | POA: Diagnosis not present

## 2023-07-05 DIAGNOSIS — N183 Chronic kidney disease, stage 3 unspecified: Secondary | ICD-10-CM | POA: Diagnosis not present

## 2023-07-05 DIAGNOSIS — I130 Hypertensive heart and chronic kidney disease with heart failure and stage 1 through stage 4 chronic kidney disease, or unspecified chronic kidney disease: Secondary | ICD-10-CM | POA: Diagnosis not present

## 2023-09-02 DIAGNOSIS — I509 Heart failure, unspecified: Secondary | ICD-10-CM | POA: Diagnosis not present

## 2023-12-20 DIAGNOSIS — J069 Acute upper respiratory infection, unspecified: Secondary | ICD-10-CM | POA: Diagnosis not present

## 2023-12-20 DIAGNOSIS — R451 Restlessness and agitation: Secondary | ICD-10-CM | POA: Diagnosis not present

## 2023-12-20 DIAGNOSIS — I509 Heart failure, unspecified: Secondary | ICD-10-CM | POA: Diagnosis not present

## 2023-12-20 DIAGNOSIS — R040 Epistaxis: Secondary | ICD-10-CM | POA: Diagnosis not present

## 2024-02-03 DIAGNOSIS — N390 Urinary tract infection, site not specified: Secondary | ICD-10-CM | POA: Diagnosis not present
